# Patient Record
Sex: Female | Born: 1940 | Race: White | Hispanic: No | Marital: Married | Smoking: Former smoker
Health system: Southern US, Community
[De-identification: ages and names within clinical notes are randomized; demographics above are authoritative.]

## PROBLEM LIST (undated history)

## (undated) DIAGNOSIS — E785 Hyperlipidemia, unspecified: Secondary | ICD-10-CM

## (undated) DIAGNOSIS — M109 Gout, unspecified: Secondary | ICD-10-CM

## (undated) DIAGNOSIS — N281 Cyst of kidney, acquired: Secondary | ICD-10-CM

## (undated) DIAGNOSIS — N183 Chronic kidney disease, stage 3 unspecified: Secondary | ICD-10-CM

## (undated) DIAGNOSIS — D649 Anemia, unspecified: Secondary | ICD-10-CM

## (undated) DIAGNOSIS — E119 Type 2 diabetes mellitus without complications: Secondary | ICD-10-CM

## (undated) DIAGNOSIS — M1990 Unspecified osteoarthritis, unspecified site: Secondary | ICD-10-CM

## (undated) DIAGNOSIS — J189 Pneumonia, unspecified organism: Secondary | ICD-10-CM

## (undated) DIAGNOSIS — G43909 Migraine, unspecified, not intractable, without status migrainosus: Secondary | ICD-10-CM

## (undated) DIAGNOSIS — M545 Low back pain, unspecified: Secondary | ICD-10-CM

## (undated) DIAGNOSIS — Z87442 Personal history of urinary calculi: Secondary | ICD-10-CM

## (undated) DIAGNOSIS — I253 Aneurysm of heart: Secondary | ICD-10-CM

## (undated) DIAGNOSIS — I119 Hypertensive heart disease without heart failure: Secondary | ICD-10-CM

## (undated) DIAGNOSIS — Z9981 Dependence on supplemental oxygen: Secondary | ICD-10-CM

## (undated) DIAGNOSIS — I10 Essential (primary) hypertension: Secondary | ICD-10-CM

## (undated) DIAGNOSIS — I25118 Atherosclerotic heart disease of native coronary artery with other forms of angina pectoris: Secondary | ICD-10-CM

## (undated) DIAGNOSIS — G4730 Sleep apnea, unspecified: Secondary | ICD-10-CM

## (undated) DIAGNOSIS — G8929 Other chronic pain: Secondary | ICD-10-CM

## (undated) DIAGNOSIS — I2510 Atherosclerotic heart disease of native coronary artery without angina pectoris: Secondary | ICD-10-CM

## (undated) DIAGNOSIS — K219 Gastro-esophageal reflux disease without esophagitis: Secondary | ICD-10-CM

## (undated) DIAGNOSIS — Z970 Presence of artificial eye: Secondary | ICD-10-CM

## (undated) DIAGNOSIS — N289 Disorder of kidney and ureter, unspecified: Secondary | ICD-10-CM

## (undated) DIAGNOSIS — I5042 Chronic combined systolic (congestive) and diastolic (congestive) heart failure: Secondary | ICD-10-CM

## (undated) DIAGNOSIS — I214 Non-ST elevation (NSTEMI) myocardial infarction: Secondary | ICD-10-CM

## (undated) HISTORY — DX: Hyperlipidemia, unspecified: E78.5

## (undated) HISTORY — DX: Unspecified osteoarthritis, unspecified site: M19.90

## (undated) HISTORY — PX: LUMBAR DISC SURGERY: SHX700

## (undated) HISTORY — PX: EYE SURGERY: SHX253

## (undated) HISTORY — PX: REDUCTION MAMMAPLASTY: SUR839

## (undated) HISTORY — DX: Gastro-esophageal reflux disease without esophagitis: K21.9

## (undated) HISTORY — DX: Atherosclerotic heart disease of native coronary artery with other forms of angina pectoris: I25.118

## (undated) HISTORY — DX: Disorder of kidney and ureter, unspecified: N28.9

## (undated) HISTORY — DX: Essential (primary) hypertension: I10

## (undated) HISTORY — PX: KNEE ARTHROSCOPY: SHX127

## (undated) HISTORY — PX: BACK SURGERY: SHX140

## (undated) HISTORY — PX: DILATION AND CURETTAGE OF UTERUS: SHX78

---
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
## 1968-11-17 HISTORY — PX: VAGINAL HYSTERECTOMY: SUR661

## 1980-11-17 HISTORY — PX: CHOLECYSTECTOMY OPEN: SUR202

## 2009-04-23 ENCOUNTER — Ambulatory Visit (HOSPITAL_COMMUNITY): Admission: RE | Admit: 2009-04-23 | Discharge: 2009-04-23 | Payer: Self-pay | Admitting: Neurosurgery

## 2009-05-07 ENCOUNTER — Encounter: Admission: RE | Admit: 2009-05-07 | Discharge: 2009-05-07 | Payer: Self-pay | Admitting: Neurosurgery

## 2017-03-09 DIAGNOSIS — M65231 Calcific tendinitis, right forearm: Secondary | ICD-10-CM | POA: Diagnosis not present

## 2017-03-09 DIAGNOSIS — I2510 Atherosclerotic heart disease of native coronary artery without angina pectoris: Secondary | ICD-10-CM | POA: Diagnosis not present

## 2017-03-09 DIAGNOSIS — E119 Type 2 diabetes mellitus without complications: Secondary | ICD-10-CM | POA: Diagnosis not present

## 2017-03-25 DIAGNOSIS — M19021 Primary osteoarthritis, right elbow: Secondary | ICD-10-CM | POA: Diagnosis not present

## 2017-03-25 DIAGNOSIS — M25521 Pain in right elbow: Secondary | ICD-10-CM | POA: Diagnosis not present

## 2017-09-09 DIAGNOSIS — Z23 Encounter for immunization: Secondary | ICD-10-CM | POA: Diagnosis not present

## 2017-09-30 DIAGNOSIS — Z0000 Encounter for general adult medical examination without abnormal findings: Secondary | ICD-10-CM | POA: Diagnosis not present

## 2017-10-02 DIAGNOSIS — R8290 Unspecified abnormal findings in urine: Secondary | ICD-10-CM | POA: Diagnosis present

## 2017-10-02 DIAGNOSIS — I129 Hypertensive chronic kidney disease with stage 1 through stage 4 chronic kidney disease, or unspecified chronic kidney disease: Secondary | ICD-10-CM | POA: Diagnosis not present

## 2017-10-02 DIAGNOSIS — R0789 Other chest pain: Secondary | ICD-10-CM | POA: Diagnosis not present

## 2017-10-02 DIAGNOSIS — I493 Ventricular premature depolarization: Secondary | ICD-10-CM | POA: Diagnosis present

## 2017-10-02 DIAGNOSIS — G8929 Other chronic pain: Secondary | ICD-10-CM | POA: Diagnosis present

## 2017-10-02 DIAGNOSIS — E669 Obesity, unspecified: Secondary | ICD-10-CM | POA: Diagnosis not present

## 2017-10-02 DIAGNOSIS — Z794 Long term (current) use of insulin: Secondary | ICD-10-CM | POA: Diagnosis not present

## 2017-10-02 DIAGNOSIS — N183 Chronic kidney disease, stage 3 (moderate): Secondary | ICD-10-CM | POA: Diagnosis not present

## 2017-10-02 DIAGNOSIS — M159 Polyosteoarthritis, unspecified: Secondary | ICD-10-CM | POA: Diagnosis present

## 2017-10-02 DIAGNOSIS — Z7982 Long term (current) use of aspirin: Secondary | ICD-10-CM | POA: Diagnosis not present

## 2017-10-02 DIAGNOSIS — R079 Chest pain, unspecified: Secondary | ICD-10-CM | POA: Diagnosis not present

## 2017-10-02 DIAGNOSIS — F419 Anxiety disorder, unspecified: Secondary | ICD-10-CM | POA: Diagnosis not present

## 2017-10-02 DIAGNOSIS — K219 Gastro-esophageal reflux disease without esophagitis: Secondary | ICD-10-CM | POA: Diagnosis not present

## 2017-10-02 DIAGNOSIS — G4730 Sleep apnea, unspecified: Secondary | ICD-10-CM | POA: Diagnosis not present

## 2017-10-02 DIAGNOSIS — D509 Iron deficiency anemia, unspecified: Secondary | ICD-10-CM | POA: Diagnosis not present

## 2017-10-02 DIAGNOSIS — I2510 Atherosclerotic heart disease of native coronary artery without angina pectoris: Secondary | ICD-10-CM | POA: Diagnosis not present

## 2017-10-02 DIAGNOSIS — M549 Dorsalgia, unspecified: Secondary | ICD-10-CM | POA: Diagnosis present

## 2017-10-02 DIAGNOSIS — Z9049 Acquired absence of other specified parts of digestive tract: Secondary | ICD-10-CM | POA: Diagnosis not present

## 2017-10-02 DIAGNOSIS — Z6835 Body mass index (BMI) 35.0-35.9, adult: Secondary | ICD-10-CM | POA: Diagnosis not present

## 2017-10-02 DIAGNOSIS — M940 Chondrocostal junction syndrome [Tietze]: Secondary | ICD-10-CM | POA: Diagnosis present

## 2017-10-02 DIAGNOSIS — E1122 Type 2 diabetes mellitus with diabetic chronic kidney disease: Secondary | ICD-10-CM | POA: Diagnosis not present

## 2017-10-02 DIAGNOSIS — I252 Old myocardial infarction: Secondary | ICD-10-CM | POA: Diagnosis not present

## 2017-10-02 DIAGNOSIS — M109 Gout, unspecified: Secondary | ICD-10-CM | POA: Diagnosis present

## 2017-10-02 DIAGNOSIS — I214 Non-ST elevation (NSTEMI) myocardial infarction: Secondary | ICD-10-CM | POA: Diagnosis not present

## 2017-10-02 DIAGNOSIS — K449 Diaphragmatic hernia without obstruction or gangrene: Secondary | ICD-10-CM | POA: Diagnosis not present

## 2017-10-02 DIAGNOSIS — Z743 Need for continuous supervision: Secondary | ICD-10-CM | POA: Diagnosis not present

## 2017-10-02 DIAGNOSIS — Z87891 Personal history of nicotine dependence: Secondary | ICD-10-CM | POA: Diagnosis not present

## 2017-10-15 DIAGNOSIS — E119 Type 2 diabetes mellitus without complications: Secondary | ICD-10-CM | POA: Diagnosis not present

## 2017-10-15 DIAGNOSIS — I2510 Atherosclerotic heart disease of native coronary artery without angina pectoris: Secondary | ICD-10-CM | POA: Diagnosis not present

## 2017-10-15 DIAGNOSIS — R079 Chest pain, unspecified: Secondary | ICD-10-CM | POA: Diagnosis not present

## 2017-10-15 DIAGNOSIS — R0602 Shortness of breath: Secondary | ICD-10-CM | POA: Diagnosis not present

## 2017-11-26 DIAGNOSIS — R079 Chest pain, unspecified: Secondary | ICD-10-CM | POA: Diagnosis not present

## 2017-11-26 DIAGNOSIS — I10 Essential (primary) hypertension: Secondary | ICD-10-CM | POA: Diagnosis not present

## 2017-11-27 DIAGNOSIS — Z1231 Encounter for screening mammogram for malignant neoplasm of breast: Secondary | ICD-10-CM | POA: Diagnosis not present

## 2017-12-04 DIAGNOSIS — I10 Essential (primary) hypertension: Secondary | ICD-10-CM | POA: Diagnosis not present

## 2017-12-04 DIAGNOSIS — R079 Chest pain, unspecified: Secondary | ICD-10-CM | POA: Diagnosis not present

## 2017-12-08 DIAGNOSIS — Z961 Presence of intraocular lens: Secondary | ICD-10-CM | POA: Diagnosis not present

## 2017-12-08 DIAGNOSIS — E109 Type 1 diabetes mellitus without complications: Secondary | ICD-10-CM | POA: Diagnosis not present

## 2017-12-18 DIAGNOSIS — R079 Chest pain, unspecified: Secondary | ICD-10-CM | POA: Diagnosis not present

## 2017-12-18 DIAGNOSIS — I10 Essential (primary) hypertension: Secondary | ICD-10-CM | POA: Diagnosis not present

## 2017-12-18 HISTORY — PX: CARDIAC CATHETERIZATION: SHX172

## 2017-12-23 DIAGNOSIS — R079 Chest pain, unspecified: Secondary | ICD-10-CM | POA: Diagnosis not present

## 2017-12-23 DIAGNOSIS — I719 Aortic aneurysm of unspecified site, without rupture: Secondary | ICD-10-CM | POA: Diagnosis not present

## 2017-12-23 DIAGNOSIS — I10 Essential (primary) hypertension: Secondary | ICD-10-CM | POA: Diagnosis not present

## 2018-01-07 DIAGNOSIS — I10 Essential (primary) hypertension: Secondary | ICD-10-CM | POA: Diagnosis not present

## 2018-01-07 DIAGNOSIS — I25118 Atherosclerotic heart disease of native coronary artery with other forms of angina pectoris: Secondary | ICD-10-CM | POA: Diagnosis not present

## 2018-01-13 DIAGNOSIS — I9581 Postprocedural hypotension: Secondary | ICD-10-CM | POA: Diagnosis not present

## 2018-01-13 DIAGNOSIS — R8290 Unspecified abnormal findings in urine: Secondary | ICD-10-CM | POA: Diagnosis not present

## 2018-01-13 DIAGNOSIS — I214 Non-ST elevation (NSTEMI) myocardial infarction: Secondary | ICD-10-CM | POA: Diagnosis present

## 2018-01-13 DIAGNOSIS — G4733 Obstructive sleep apnea (adult) (pediatric): Secondary | ICD-10-CM | POA: Diagnosis present

## 2018-01-13 DIAGNOSIS — Q613 Polycystic kidney, unspecified: Secondary | ICD-10-CM | POA: Diagnosis not present

## 2018-01-13 DIAGNOSIS — R9439 Abnormal result of other cardiovascular function study: Secondary | ICD-10-CM | POA: Diagnosis not present

## 2018-01-13 DIAGNOSIS — J90 Pleural effusion, not elsewhere classified: Secondary | ICD-10-CM | POA: Diagnosis not present

## 2018-01-13 DIAGNOSIS — Q612 Polycystic kidney, adult type: Secondary | ICD-10-CM | POA: Diagnosis not present

## 2018-01-13 DIAGNOSIS — D62 Acute posthemorrhagic anemia: Secondary | ICD-10-CM | POA: Diagnosis not present

## 2018-01-13 DIAGNOSIS — Z87891 Personal history of nicotine dependence: Secondary | ICD-10-CM | POA: Diagnosis not present

## 2018-01-13 DIAGNOSIS — E1165 Type 2 diabetes mellitus with hyperglycemia: Secondary | ICD-10-CM | POA: Diagnosis not present

## 2018-01-13 DIAGNOSIS — E790 Hyperuricemia without signs of inflammatory arthritis and tophaceous disease: Secondary | ICD-10-CM | POA: Diagnosis not present

## 2018-01-13 DIAGNOSIS — I25119 Atherosclerotic heart disease of native coronary artery with unspecified angina pectoris: Secondary | ICD-10-CM | POA: Diagnosis present

## 2018-01-13 DIAGNOSIS — N200 Calculus of kidney: Secondary | ICD-10-CM | POA: Diagnosis present

## 2018-01-13 DIAGNOSIS — N183 Chronic kidney disease, stage 3 (moderate): Secondary | ICD-10-CM | POA: Diagnosis present

## 2018-01-13 DIAGNOSIS — R410 Disorientation, unspecified: Secondary | ICD-10-CM | POA: Diagnosis not present

## 2018-01-13 DIAGNOSIS — K219 Gastro-esophageal reflux disease without esophagitis: Secondary | ICD-10-CM | POA: Diagnosis present

## 2018-01-13 DIAGNOSIS — I2510 Atherosclerotic heart disease of native coronary artery without angina pectoris: Secondary | ICD-10-CM | POA: Diagnosis not present

## 2018-01-13 DIAGNOSIS — R0602 Shortness of breath: Secondary | ICD-10-CM | POA: Diagnosis not present

## 2018-01-13 DIAGNOSIS — R079 Chest pain, unspecified: Secondary | ICD-10-CM | POA: Diagnosis not present

## 2018-01-13 DIAGNOSIS — I25110 Atherosclerotic heart disease of native coronary artery with unstable angina pectoris: Secondary | ICD-10-CM | POA: Diagnosis not present

## 2018-01-13 DIAGNOSIS — R5381 Other malaise: Secondary | ICD-10-CM | POA: Diagnosis not present

## 2018-01-13 DIAGNOSIS — G8918 Other acute postprocedural pain: Secondary | ICD-10-CM | POA: Diagnosis not present

## 2018-01-13 DIAGNOSIS — D696 Thrombocytopenia, unspecified: Secondary | ICD-10-CM | POA: Diagnosis not present

## 2018-01-13 DIAGNOSIS — T402X5A Adverse effect of other opioids, initial encounter: Secondary | ICD-10-CM | POA: Diagnosis not present

## 2018-01-13 DIAGNOSIS — E1122 Type 2 diabetes mellitus with diabetic chronic kidney disease: Secondary | ICD-10-CM | POA: Diagnosis present

## 2018-01-13 DIAGNOSIS — R0789 Other chest pain: Secondary | ICD-10-CM | POA: Diagnosis not present

## 2018-01-13 DIAGNOSIS — I255 Ischemic cardiomyopathy: Secondary | ICD-10-CM | POA: Diagnosis present

## 2018-01-13 DIAGNOSIS — I129 Hypertensive chronic kidney disease with stage 1 through stage 4 chronic kidney disease, or unspecified chronic kidney disease: Secondary | ICD-10-CM | POA: Diagnosis present

## 2018-01-13 DIAGNOSIS — N390 Urinary tract infection, site not specified: Secondary | ICD-10-CM | POA: Diagnosis not present

## 2018-01-13 DIAGNOSIS — E119 Type 2 diabetes mellitus without complications: Secondary | ICD-10-CM | POA: Diagnosis not present

## 2018-01-14 ENCOUNTER — Encounter: Payer: Self-pay | Admitting: Internal Medicine

## 2018-01-19 HISTORY — PX: CORONARY ARTERY BYPASS GRAFT: SHX141

## 2018-01-26 DIAGNOSIS — Z7982 Long term (current) use of aspirin: Secondary | ICD-10-CM | POA: Diagnosis not present

## 2018-01-26 DIAGNOSIS — H54413A Blindness right eye category 3, normal vision left eye: Secondary | ICD-10-CM | POA: Diagnosis not present

## 2018-01-26 DIAGNOSIS — Z794 Long term (current) use of insulin: Secondary | ICD-10-CM | POA: Diagnosis not present

## 2018-01-26 DIAGNOSIS — Z9981 Dependence on supplemental oxygen: Secondary | ICD-10-CM | POA: Diagnosis not present

## 2018-01-26 DIAGNOSIS — Q613 Polycystic kidney, unspecified: Secondary | ICD-10-CM | POA: Diagnosis not present

## 2018-01-26 DIAGNOSIS — S20112D Abrasion of breast, left breast, subsequent encounter: Secondary | ICD-10-CM | POA: Diagnosis not present

## 2018-01-26 DIAGNOSIS — E119 Type 2 diabetes mellitus without complications: Secondary | ICD-10-CM | POA: Diagnosis not present

## 2018-01-26 DIAGNOSIS — G4733 Obstructive sleep apnea (adult) (pediatric): Secondary | ICD-10-CM | POA: Diagnosis not present

## 2018-01-26 DIAGNOSIS — I10 Essential (primary) hypertension: Secondary | ICD-10-CM | POA: Diagnosis not present

## 2018-01-26 DIAGNOSIS — Z7902 Long term (current) use of antithrombotics/antiplatelets: Secondary | ICD-10-CM | POA: Diagnosis not present

## 2018-01-26 DIAGNOSIS — I2510 Atherosclerotic heart disease of native coronary artery without angina pectoris: Secondary | ICD-10-CM | POA: Diagnosis not present

## 2018-01-26 DIAGNOSIS — S20111D Abrasion of breast, right breast, subsequent encounter: Secondary | ICD-10-CM | POA: Diagnosis not present

## 2018-01-26 DIAGNOSIS — Z48812 Encounter for surgical aftercare following surgery on the circulatory system: Secondary | ICD-10-CM | POA: Diagnosis not present

## 2018-01-28 DIAGNOSIS — Z951 Presence of aortocoronary bypass graft: Secondary | ICD-10-CM | POA: Diagnosis not present

## 2018-01-28 DIAGNOSIS — Z48812 Encounter for surgical aftercare following surgery on the circulatory system: Secondary | ICD-10-CM | POA: Diagnosis not present

## 2018-01-28 DIAGNOSIS — E119 Type 2 diabetes mellitus without complications: Secondary | ICD-10-CM | POA: Diagnosis not present

## 2018-01-28 DIAGNOSIS — I10 Essential (primary) hypertension: Secondary | ICD-10-CM | POA: Diagnosis not present

## 2018-01-28 DIAGNOSIS — S20111D Abrasion of breast, right breast, subsequent encounter: Secondary | ICD-10-CM | POA: Diagnosis not present

## 2018-01-28 DIAGNOSIS — I2510 Atherosclerotic heart disease of native coronary artery without angina pectoris: Secondary | ICD-10-CM | POA: Diagnosis not present

## 2018-01-28 DIAGNOSIS — Q613 Polycystic kidney, unspecified: Secondary | ICD-10-CM | POA: Diagnosis not present

## 2018-01-30 DIAGNOSIS — Z48812 Encounter for surgical aftercare following surgery on the circulatory system: Secondary | ICD-10-CM | POA: Diagnosis not present

## 2018-01-30 DIAGNOSIS — I2510 Atherosclerotic heart disease of native coronary artery without angina pectoris: Secondary | ICD-10-CM | POA: Diagnosis not present

## 2018-01-30 DIAGNOSIS — Q613 Polycystic kidney, unspecified: Secondary | ICD-10-CM | POA: Diagnosis not present

## 2018-01-30 DIAGNOSIS — S20111D Abrasion of breast, right breast, subsequent encounter: Secondary | ICD-10-CM | POA: Diagnosis not present

## 2018-01-30 DIAGNOSIS — I10 Essential (primary) hypertension: Secondary | ICD-10-CM | POA: Diagnosis not present

## 2018-01-30 DIAGNOSIS — E119 Type 2 diabetes mellitus without complications: Secondary | ICD-10-CM | POA: Diagnosis not present

## 2018-02-04 ENCOUNTER — Ambulatory Visit (INDEPENDENT_AMBULATORY_CARE_PROVIDER_SITE_OTHER): Payer: Medicare Other | Admitting: Family Medicine

## 2018-02-04 ENCOUNTER — Encounter: Payer: Self-pay | Admitting: Family Medicine

## 2018-02-04 VITALS — BP 100/70 | HR 76 | Temp 98.7°F | Ht 62.0 in | Wt 189.4 lb

## 2018-02-04 DIAGNOSIS — Z951 Presence of aortocoronary bypass graft: Secondary | ICD-10-CM

## 2018-02-04 HISTORY — DX: Presence of aortocoronary bypass graft: Z95.1

## 2018-02-04 MED ORDER — ACETAMINOPHEN-CODEINE #3 300-30 MG PO TABS: 1.0000 | ORAL_TABLET | Freq: Four times a day (QID) | ORAL | 0 refills | Status: DC | PRN

## 2018-02-04 NOTE — Patient Instructions (Addendum)
2-3 business days for someone to reach out regarding home health. If you don't hear anything, call and ask for an update.  Let me know if you need refills.  Let us know if you need anything.

## 2018-02-04 NOTE — Progress Notes (Signed)
Pre visit review using our clinic review tool, if applicable. No additional management support is needed unless otherwise documented below in the visit note. 

## 2018-02-04 NOTE — Progress Notes (Signed)
Chief Complaint  Patient presents with  . Establish Care       New Patient Visit SUBJECTIVE: HPI: Margaret Ward is an 77 y.o.female who is being seen for establishing care.  The patient was previously seen at office [facility]. Here with daughter.  Little over 2 weeks ago, the patient had a triple bypass surgery on her heart.  She is still slightly painful but is recovering.  She had the surgery in [place], however her husband has a bad knee and all of her family members are in [place] so she moved down here.  She is currently using a walker for ambulation and is still slightly unsteady on her feet.  She had home health in [place] and her family was hoping to have this set up down here.  This is to help her with activities of daily living in addition to helping her with medications.  She does have an appointment with Dr. Dulce Sellar of the cardiology team in 5 days.  She denies any chest pain unrelated to her incision or shortness of breath.  She did not require any refills at this time.  Allergies  Allergen Reactions  . Methylprednisolone     Severe cramps legs and arms  . Keflex [Cephalexin] Hives  . Ketorolac Swelling  . Novocain [Procaine] Swelling  . Septra [Sulfamethoxazole-Trimethoprim] Nausea And Vomiting   Past Medical History:  Diagnosis Date  . Arthritis   . Diabetes mellitus without complication (HCC)   . Heart disease   . Hyperlipidemia   . Kidney disease   . UTI (urinary tract infection)    Past Surgical History:  Procedure Laterality Date  . ABDOMINAL HYSTERECTOMY  1970  . CHOLECYSTECTOMY  1982  . TONSILLECTOMY  1953   Social History   Socioeconomic History  . Marital status: Married   Family History  Problem Relation Age of Onset  . Kidney disease Sister   . Heart disease Brother   . Hypertension Brother   . Hypertension Daughter   . Hypertension Daughter   . Hypertension Daughter   . Hypertension Daughter      Current Outpatient  Medications:  .  acetaminophen-codeine (TYLENOL #3) 300-30 MG tablet, Take 1-2 tablets by mouth every 6 (six) hours as needed for moderate pain., Disp: 12 tablet, Rfl: 0 .  allopurinol (ZYLOPRIM) 100 MG tablet, Take 100 mg by mouth daily., Disp: , Rfl:  .  aspirin EC 81 MG tablet, Take 81 mg by mouth daily., Disp: , Rfl:  .  atenolol (TENORMIN) 50 MG tablet, Take 50 mg by mouth daily., Disp: , Rfl:  .  atorvastatin (LIPITOR) 40 MG tablet, Take 40 mg by mouth daily., Disp: , Rfl:  .  clopidogrel (PLAVIX) 75 MG tablet, Take 75 mg by mouth daily., Disp: , Rfl:  .  diltiazem (DILACOR XR) 180 MG 24 hr capsule, Take 180 mg by mouth daily., Disp: , Rfl:  .  docusate sodium (COLACE) 100 MG capsule, Take 100 mg by mouth 2 (two) times daily., Disp: , Rfl:  .  famotidine (PEPCID) 20 MG tablet, Take 1/2 every 12 hours, Disp: , Rfl:  .  ferrous sulfate 325 (65 FE) MG tablet, Take 325 mg by mouth daily with breakfast., Disp: , Rfl:  .  Insulin Glargine (LANTUS) 100 UNIT/ML Solostar Pen, Inject 18 Units into the skin at bedtime., Disp: , Rfl:  .  ranitidine (ZANTAC) 150 MG tablet, Take 150 mg by mouth daily., Disp: , Rfl:  .  vitamin C (ASCORBIC  ACID) 500 MG tablet, Take 500 mg by mouth 2 (two) times daily., Disp: , Rfl:   ROS Cardiovascular: Denies chest pain  Respiratory: Denies dyspnea   OBJECTIVE: BP 100/70 (BP Location: Left Arm, Patient Position: Sitting, Cuff Size: Normal)   Pulse 76   Temp 98.7 F (37.1 C) (Oral)   Ht 5\' 2"  (1.575 m)   Wt 189 lb 6 oz (85.9 kg)   SpO2 96%   BMI 34.64 kg/m   Constitutional: -  VS reviewed -  Well developed, well nourished, appears stated age -  No apparent distress  Psychiatric: -  Oriented to person, place, and time -  Memory intact -  Affect and mood normal -  Fluent conversation, good eye contact -  Judgment and insight age appropriate  Eye: -  Conjunctivae clear, no discharge -  Pupils symmetric, round, reactive to light  ENMT: -  MMM    Pharynx  moist, no exudate, no erythema  Neck: -  No gross swelling, no palpable masses -  Thyroid midline, not enlarged, mobile, no palpable masses  Cardiovascular: -  RRR -  2+ pitting BLE edema  Respiratory: -  Normal respiratory effort, no accessory muscle use, no retraction -  Breath sounds equal, no wheezes, no ronchi, no crackles  Skin: -  Healing surgical site, c/d/i, no signs of infection   ASSESSMENT/PLAN: S/P coronary artery bypass graft x 3 - Plan: Ambulatory referral to Home Health  Patient instructed to sign release of records form from her previous PCP. Refill pain meds in small amount. Patient should return in 5 mo for DM visit. The patient voiced understanding and agreement to the plan.   Jilda Rocheicholas Paul [place] De[place]endling, DO 02/04/18  3:34 PM

## 2018-02-05 DIAGNOSIS — R079 Chest pain, unspecified: Secondary | ICD-10-CM

## 2018-02-05 HISTORY — DX: Chest pain, unspecified: R07.9

## 2018-02-07 DIAGNOSIS — K219 Gastro-esophageal reflux disease without esophagitis: Secondary | ICD-10-CM | POA: Diagnosis not present

## 2018-02-07 DIAGNOSIS — M1990 Unspecified osteoarthritis, unspecified site: Secondary | ICD-10-CM | POA: Diagnosis not present

## 2018-02-07 DIAGNOSIS — N189 Chronic kidney disease, unspecified: Secondary | ICD-10-CM | POA: Diagnosis not present

## 2018-02-07 DIAGNOSIS — R2681 Unsteadiness on feet: Secondary | ICD-10-CM | POA: Diagnosis not present

## 2018-02-07 DIAGNOSIS — I1310 Hypertensive heart and chronic kidney disease without heart failure, with stage 1 through stage 4 chronic kidney disease, or unspecified chronic kidney disease: Secondary | ICD-10-CM | POA: Diagnosis not present

## 2018-02-07 DIAGNOSIS — Z794 Long term (current) use of insulin: Secondary | ICD-10-CM | POA: Diagnosis not present

## 2018-02-07 DIAGNOSIS — F17211 Nicotine dependence, cigarettes, in remission: Secondary | ICD-10-CM | POA: Diagnosis not present

## 2018-02-07 DIAGNOSIS — E1122 Type 2 diabetes mellitus with diabetic chronic kidney disease: Secondary | ICD-10-CM | POA: Diagnosis not present

## 2018-02-07 DIAGNOSIS — Z48812 Encounter for surgical aftercare following surgery on the circulatory system: Secondary | ICD-10-CM | POA: Diagnosis not present

## 2018-02-07 DIAGNOSIS — Z7902 Long term (current) use of antithrombotics/antiplatelets: Secondary | ICD-10-CM | POA: Diagnosis not present

## 2018-02-07 DIAGNOSIS — Z7982 Long term (current) use of aspirin: Secondary | ICD-10-CM | POA: Diagnosis not present

## 2018-02-07 DIAGNOSIS — Z951 Presence of aortocoronary bypass graft: Secondary | ICD-10-CM | POA: Diagnosis not present

## 2018-02-07 DIAGNOSIS — I2510 Atherosclerotic heart disease of native coronary artery without angina pectoris: Secondary | ICD-10-CM | POA: Diagnosis not present

## 2018-02-08 ENCOUNTER — Ambulatory Visit: Payer: Self-pay | Admitting: Family Medicine

## 2018-02-08 DIAGNOSIS — E785 Hyperlipidemia, unspecified: Secondary | ICD-10-CM | POA: Insufficient documentation

## 2018-02-08 DIAGNOSIS — I2510 Atherosclerotic heart disease of native coronary artery without angina pectoris: Secondary | ICD-10-CM | POA: Insufficient documentation

## 2018-02-08 HISTORY — DX: Atherosclerotic heart disease of native coronary artery without angina pectoris: I25.10

## 2018-02-08 NOTE — Progress Notes (Signed)
Cardiology Office Note:    Date:  02/09/2018   ID:  Margaret Ward, DOB 10/01/1941, MRN [id number]  PCP:  Sharlene DoryWendling, Nicholas Paul, DO  Cardiologist:  Norman HerrlichBrian Nuria Phebus, MD   Referring MD: Sharlene DoryWendling, Nicholas Paul*  ASSESSMENT:    1. S/P coronary artery bypass graft x 3   2. Coronary artery disease of native artery of native heart with stable angina pectoris (HCC)   3. Hyperlipidemia, unspecified hyperlipidemia type   4. Left ventricular aneurysm   5. Left shoulder pain, unspecified chronicity    PLAN:    In order of problems listed above:  1. She has progressed well no postoperative complications check chest x-ray lab work including CMP and lipid profile and arrange for follow-up with CT surgery. 2. Improved after surgery she is on both aspirin clopidogrel and I will continue that along with her beta-blocker and statin.  I asked her to have an echocardiogram to assess will be described as an apical aneurysm and look for evidence of thrombus. 3. Stable continue her statin check liver function and lipid profile 4. Echocardiogram ordered if thrombus is present we would need to consider a different strategy of antiplatelet.  Next appointment 4 weeks   Medication Adjustments/Labs and Tests Ordered: Current medicines are reviewed at length with the patient today.  Concerns regarding medicines are outlined above.  Orders Placed This Encounter  Procedures  . DG Chest 2 View  . DG Scapula Left  . Comprehensive Metabolic Panel (CMET)  . Lipid Profile  . Ambulatory referral to Cardiothoracic Surgery  . EKG 12-Lead  . ECHOCARDIOGRAM COMPLETE   No orders of the defined types were placed in this encounter.    Chief Complaint  Patient presents with  . New Patient (Initial Visit)    self referall to get established with cardiologist after recent CABG x 3 vessels   . Coronary Artery Disease    had CAB[place] in [place] and now living in [place]  . Hyperlipidemia    History of Present Illness:     Margaret Ward is a 77 y.o. female who is being seen today for the evaluation of CAD with recent CABG in [place] at the request of Sharlene DoryWendling, Nicholas Paul*. Her pre op echo showed " preserved systolic function with a small apical aneurysm" and angiography with multivessel CAD.  She underwent bypass surgery 01/19/2018 and her daughter brought her back to [place] .  She has not been seen by CT surgery and will make an appointment with our group in [place].  Overall she has done well no fever chills no wound drainage.  She is using incentive spirometry greater than 1500 cc and is walking with assistance and still requires assistance with ADLs.  She has minimal incisional pain no angina palpitation or syncope.  The family declines cardiac rehabilitation at this time.  She also complains of pain over the left scapula and is point tender to physical examination.  Lower extremity leg wounds are well-healed.  She has mild nonpitting edema likely related to her calcium channel blocker  Past Medical History:  Diagnosis Date  . Arthritis   . Chest pain 02/05/2018  . Diabetes mellitus without complication (HCC)   . GERD (gastroesophageal reflux disease)   . Heart disease   . Hyperlipidemia   . Hypertension   . Kidney disease     Past Surgical History:  Procedure Laterality Date  . ABDOMINAL HYSTERECTOMY  1970  . BACK SURGERY    . CHOLECYSTECTOMY  1982  . CORONARY  ARTERY BYPASS GRAFT    . TONSILLECTOMY  1953    Current Medications: Current Meds  Medication Sig  . acetaminophen-codeine (TYLENOL #3) 300-30 MG tablet Take 1-2 tablets by mouth every 6 (six) hours as needed for moderate pain.  Marland Kitchen allopurinol (ZYLOPRIM) 100 MG tablet Take 100 mg by mouth daily.  Marland Kitchen aspirin EC 81 MG tablet Take 81 mg by mouth daily.  Marland Kitchen atenolol (TENORMIN) 50 MG tablet Take 50 mg by mouth daily.  Marland Kitchen atorvastatin (LIPITOR) 40 MG tablet Take 40 mg by mouth daily.  . clopidogrel (PLAVIX) 75 MG tablet Take  75 mg by mouth daily.  Marland Kitchen diltiazem (DILACOR XR) 180 MG 24 hr capsule Take 180 mg by mouth daily.  Marland Kitchen docusate sodium (COLACE) 100 MG capsule Take 100 mg by mouth 2 (two) times daily.  . famotidine (PEPCID) 20 MG tablet Take 1/2 every 12 hours  . ferrous sulfate 325 (65 FE) MG tablet Take 325 mg by mouth daily with breakfast.  . Insulin Glargine (LANTUS) 100 UNIT/ML Solostar Pen Inject 18 Units into the skin at bedtime.  . ranitidine (ZANTAC) 150 MG tablet Take 150 mg by mouth daily.  . vitamin C (ASCORBIC ACID) 500 MG tablet Take 500 mg by mouth 2 (two) times daily.     Allergies:   Methylprednisolone; Keflex [cephalexin]; Ketorolac; Novocain [procaine]; and Septra [sulfamethoxazole-trimethoprim]   Social History   Socioeconomic History  . Marital status: Married    Spouse name: Not on file  . Number of children: Not on file  . Years of education: Not on file  . Highest education level: Not on file  Occupational History  . Not on file  Social Needs  . Financial resource strain: Not on file  . Food insecurity:    Worry: Not on file    Inability: Not on file  . Transportation needs:    Medical: Not on file    Non-medical: Not on file  Tobacco Use  . Smoking status: Former Games developer  . Smokeless tobacco: Never Used  Substance and Sexual Activity  . Alcohol use: Not Currently    Frequency: Never  . Drug use: Never  . Sexual activity: Not on file  Lifestyle  . Physical activity:    Days per week: Not on file    Minutes per session: Not on file  . Stress: Not on file  Relationships  . Social connections:    Talks on phone: Not on file    Gets together: Not on file    Attends religious service: Not on file    Active member of club or organization: Not on file    Attends meetings of clubs or organizations: Not on file    Relationship status: Not on file  Other Topics Concern  . Not on file  Social History Narrative  . Not on file     Family History: The patient's family  history includes Diabetes in her father; Heart Problems in her mother; Heart disease in her brother; Hypertension in her brother, daughter, daughter, daughter, and daughter; Kidney disease in her brother and sister.  ROS:   ROS Please see the history of present illness.     All other systems reviewed and are negative.  EKGs/Labs/Other Studies Reviewed:    The following studies were reviewed today: Cardiology records office visit stress test heart catheterization reviewed CT surgery records requested not in epic  EKG:  EKG is  ordered today.  The ekg ordered today demonstrates QS in V3  consistent with apical MI  Recent Labs: No results found for requested labs within last 8760 hours.  Recent Lipid Panel No results found for: CHOL, TRIG, HDL, CHOLHDL, VLDL, LDLCALC, LDLDIRECT  Physical Exam:    VS:  BP 126/72 (BP Location: Right Arm, Patient Position: Sitting, Cuff Size: Normal)   Pulse 67   Ht 5\' 2"  (1.575 m)   Wt 190 lb (86.2 kg)   SpO2 98%   BMI 34.75 kg/m     Wt Readings from Last 3 Encounters:  02/09/18 190 lb (86.2 kg)  02/04/18 189 lb 6 oz (85.9 kg)     GEN: She looks frail but well nourished, well developed in no acute distress HEENT: Normal NECK: No JVD; No carotid bruits LYMPHATICS: No lymphadenopathy CARDIAC: RRR, no murmurs, rubs, gallops RESPIRATORY:  Clear to auscultation without rales, wheezing or rhonchi  ABDOMEN: Soft, non-tender, non-distended MUSCULOSKELETAL:  No edema; No deformity  SKIN: Warm and dry NEUROLOGIC:  Alert and oriented x 3 PSYCHIATRIC:  Normal affect     Signed, Norman Herrlich, MD  02/09/2018 1:03 PM    [facility]

## 2018-02-09 ENCOUNTER — Ambulatory Visit (HOSPITAL_BASED_OUTPATIENT_CLINIC_OR_DEPARTMENT_OTHER)
Admission: RE | Admit: 2018-02-09 | Discharge: 2018-02-09 | Disposition: A | Discharge status: Home / Self Care | Payer: Medicare Other | Source: Ambulatory Visit | Attending: Cardiology | Admitting: Cardiology

## 2018-02-09 ENCOUNTER — Encounter: Payer: Self-pay | Admitting: Cardiology

## 2018-02-09 ENCOUNTER — Ambulatory Visit (INDEPENDENT_AMBULATORY_CARE_PROVIDER_SITE_OTHER): Payer: Medicare Other | Admitting: Cardiology

## 2018-02-09 VITALS — BP 126/72 | HR 67 | Ht 62.0 in | Wt 190.0 lb

## 2018-02-09 DIAGNOSIS — M25512 Pain in left shoulder: Secondary | ICD-10-CM | POA: Diagnosis not present

## 2018-02-09 DIAGNOSIS — I253 Aneurysm of heart: Secondary | ICD-10-CM

## 2018-02-09 DIAGNOSIS — Z951 Presence of aortocoronary bypass graft: Secondary | ICD-10-CM | POA: Diagnosis not present

## 2018-02-09 DIAGNOSIS — J90 Pleural effusion, not elsewhere classified: Secondary | ICD-10-CM | POA: Insufficient documentation

## 2018-02-09 DIAGNOSIS — R918 Other nonspecific abnormal finding of lung field: Secondary | ICD-10-CM | POA: Insufficient documentation

## 2018-02-09 DIAGNOSIS — I25118 Atherosclerotic heart disease of native coronary artery with other forms of angina pectoris: Secondary | ICD-10-CM | POA: Diagnosis not present

## 2018-02-09 DIAGNOSIS — E785 Hyperlipidemia, unspecified: Secondary | ICD-10-CM | POA: Diagnosis not present

## 2018-02-09 DIAGNOSIS — Z9889 Other specified postprocedural states: Secondary | ICD-10-CM | POA: Insufficient documentation

## 2018-02-09 NOTE — Patient Instructions (Signed)
Medication Instructions:  Your physician recommends that you continue on your current medications as directed. Please refer to the Current Medication list given to you today.  Labwork: Your physician recommends that you have the following labs drawn: CMP, lipid  Testing/Procedures: A chest x-ray takes a picture of the organs and structures inside the chest, including the heart, lungs, and blood vessels. This test can show several things, including, whether the heart is enlarges; whether fluid is building up in the lungs; and whether pacemaker / defibrillator leads are still in place.  You will also have a x-ray of your scapula.  Your physician has requested that you have an echocardiogram. Echocardiography is a painless test that uses sound waves to create images of your heart. It provides your doctor with information about the size and shape of your heart and how well your heart's chambers and valves are working. This procedure takes approximately one hour. There are no restrictions for this procedure.  Follow-Up: Your physician recommends that you schedule a follow-up appointment in: 4 weeks.  You will receive a phone call to schedule with cardiothoracic surgery.  Any Other Special Instructions Will Be Listed Below (If Applicable).     If you need a refill on your cardiac medications before your next appointment, please call your pharmacy.

## 2018-02-10 ENCOUNTER — Other Ambulatory Visit: Payer: Self-pay

## 2018-02-10 ENCOUNTER — Telehealth: Payer: Self-pay

## 2018-02-10 LAB — LIPID PANEL
Chol/HDL Ratio: 2.2 ratio (ref 0.0–4.4)
Cholesterol, Total: 84 mg/dL — ABNORMAL LOW (ref 100–199)
HDL: 39 mg/dL — ABNORMAL LOW (ref >39)
LDL CALC: 27 mg/dL (ref 0–99)
Triglycerides: 90 mg/dL (ref 0–149)
VLDL CHOLESTEROL CAL: 18 mg/dL (ref 5–40)

## 2018-02-10 LAB — COMPREHENSIVE METABOLIC PANEL
ALBUMIN: 3.2 g/dL — AB (ref 3.5–4.8)
ALT: 13 IU/L (ref 0–32)
AST: 14 IU/L (ref 0–40)
Albumin/Globulin Ratio: 1 — ABNORMAL LOW (ref 1.2–2.2)
Alkaline Phosphatase: 114 IU/L (ref 39–117)
BUN / CREAT RATIO: 15 (ref 12–28)
BUN: 18 mg/dL (ref 8–27)
CO2: 21 mmol/L (ref 20–29)
Calcium: 8.6 mg/dL — ABNORMAL LOW (ref 8.7–10.3)
Chloride: 110 mmol/L — ABNORMAL HIGH (ref 96–106)
Creatinine, Ser: 1.2 mg/dL — ABNORMAL HIGH (ref 0.57–1.00)
GFR calc Af Amer: 50 mL/min/{1.73_m2} — ABNORMAL LOW (ref >59)
GFR calc non Af Amer: 44 mL/min/{1.73_m2} — ABNORMAL LOW (ref >59)
GLOBULIN, TOTAL: 3.1 g/dL (ref 1.5–4.5)
GLUCOSE: 100 mg/dL — AB (ref 65–99)
Potassium: 5.2 mmol/L (ref 3.5–5.2)
Sodium: 144 mmol/L (ref 134–144)
Total Protein: 6.3 g/dL (ref 6.0–8.5)

## 2018-02-10 MED ORDER — ATORVASTATIN CALCIUM 20 MG PO TABS: 40.0000 mg | ORAL_TABLET | Freq: Every day | ORAL | 3 refills | Status: DC

## 2018-02-10 NOTE — Telephone Encounter (Signed)
Spoke with Lorin PicketScott, Director of [facility], regarding setting patient up for cardiac rehab. Patient has [facility] by Dr. Carmelia RollerWendling. Advised Scott that Dr. Dulce SellarMunley would like to know if they had a home health cardiac rehab program. Lorin PicketScott advised that they do offer cardiac rehab home health services with nursing staff and a physical therapist. Verbal order for cardiac rehab given. Faxing Dr. Hulen ShoutsMunley's office note to [phone]. No further questions.

## 2018-02-11 ENCOUNTER — Telehealth: Payer: Self-pay | Admitting: Cardiology

## 2018-02-11 DIAGNOSIS — I1310 Hypertensive heart and chronic kidney disease without heart failure, with stage 1 through stage 4 chronic kidney disease, or unspecified chronic kidney disease: Secondary | ICD-10-CM | POA: Diagnosis not present

## 2018-02-11 DIAGNOSIS — N189 Chronic kidney disease, unspecified: Secondary | ICD-10-CM | POA: Diagnosis not present

## 2018-02-11 DIAGNOSIS — E1122 Type 2 diabetes mellitus with diabetic chronic kidney disease: Secondary | ICD-10-CM | POA: Diagnosis not present

## 2018-02-11 DIAGNOSIS — Z951 Presence of aortocoronary bypass graft: Secondary | ICD-10-CM | POA: Diagnosis not present

## 2018-02-11 DIAGNOSIS — I2510 Atherosclerotic heart disease of native coronary artery without angina pectoris: Secondary | ICD-10-CM | POA: Diagnosis not present

## 2018-02-11 DIAGNOSIS — Z48812 Encounter for surgical aftercare following surgery on the circulatory system: Secondary | ICD-10-CM | POA: Diagnosis not present

## 2018-02-11 NOTE — Telephone Encounter (Signed)
Informed the nurse that the patient was not started on a new medication at her last visit.

## 2018-02-11 NOTE — Telephone Encounter (Signed)
Wants to know if pt was started on a new med

## 2018-02-12 ENCOUNTER — Telehealth: Payer: Self-pay | Admitting: *Deleted

## 2018-02-12 NOTE — Telephone Encounter (Signed)
Received Physician Orders from [facility]; forwarded to provider/SLS 03/29

## 2018-02-15 DIAGNOSIS — Z48812 Encounter for surgical aftercare following surgery on the circulatory system: Secondary | ICD-10-CM | POA: Diagnosis not present

## 2018-02-15 DIAGNOSIS — I1310 Hypertensive heart and chronic kidney disease without heart failure, with stage 1 through stage 4 chronic kidney disease, or unspecified chronic kidney disease: Secondary | ICD-10-CM | POA: Diagnosis not present

## 2018-02-15 DIAGNOSIS — Z951 Presence of aortocoronary bypass graft: Secondary | ICD-10-CM | POA: Diagnosis not present

## 2018-02-15 DIAGNOSIS — N189 Chronic kidney disease, unspecified: Secondary | ICD-10-CM | POA: Diagnosis not present

## 2018-02-15 DIAGNOSIS — I2510 Atherosclerotic heart disease of native coronary artery without angina pectoris: Secondary | ICD-10-CM | POA: Diagnosis not present

## 2018-02-15 DIAGNOSIS — E1122 Type 2 diabetes mellitus with diabetic chronic kidney disease: Secondary | ICD-10-CM | POA: Diagnosis not present

## 2018-02-16 ENCOUNTER — Telehealth: Payer: Self-pay | Admitting: Cardiology

## 2018-02-16 DIAGNOSIS — I2510 Atherosclerotic heart disease of native coronary artery without angina pectoris: Secondary | ICD-10-CM | POA: Diagnosis not present

## 2018-02-16 DIAGNOSIS — Z48812 Encounter for surgical aftercare following surgery on the circulatory system: Secondary | ICD-10-CM | POA: Diagnosis not present

## 2018-02-16 DIAGNOSIS — N189 Chronic kidney disease, unspecified: Secondary | ICD-10-CM | POA: Diagnosis not present

## 2018-02-16 DIAGNOSIS — E1122 Type 2 diabetes mellitus with diabetic chronic kidney disease: Secondary | ICD-10-CM | POA: Diagnosis not present

## 2018-02-16 DIAGNOSIS — Z951 Presence of aortocoronary bypass graft: Secondary | ICD-10-CM | POA: Diagnosis not present

## 2018-02-16 DIAGNOSIS — I1310 Hypertensive heart and chronic kidney disease without heart failure, with stage 1 through stage 4 chronic kidney disease, or unspecified chronic kidney disease: Secondary | ICD-10-CM | POA: Diagnosis not present

## 2018-02-16 NOTE — Telephone Encounter (Signed)
Vitamin C 500mg 

## 2018-02-16 NOTE — Telephone Encounter (Signed)
What med?

## 2018-02-16 NOTE — Telephone Encounter (Signed)
You did not order and will advise that we will not refill, but is it okay to continue?

## 2018-02-16 NOTE — Telephone Encounter (Signed)
°*  STAT* If patient is at the pharmacy, call can be transferred to refill team.   1. Which medications need to be refilled? (please list name of each medication and dose if known) Vitamon C 500mg    2. Which pharmacy/location (including street and city if local pharmacy) is medication to be sent to? [facility] [place] on [address] [place]  3. Do they need a 30 day or 90 day supply? 90  Please verify if she is to continue this medicine or to stop it, Please notify daughter!

## 2018-02-17 ENCOUNTER — Telehealth: Payer: Self-pay | Admitting: Family Medicine

## 2018-02-17 NOTE — Telephone Encounter (Signed)
yes

## 2018-02-17 NOTE — Telephone Encounter (Signed)
Advised okay to continue Vitamin C, but Dr. Dulce SellarMunley will not refill. Should ask PCP or buy over the counter. Verbalized understanding, no further questions.

## 2018-02-17 NOTE — Telephone Encounter (Signed)
HHRN informed of verbal ok. 

## 2018-02-17 NOTE — Telephone Encounter (Signed)
Copied from CRM [id number]. Topic: Quick Communication - See Telephone Encounter >> Feb 17, 2018 12:30 PM Everardo PacificMoton, Briton Sellman, [place] wrote: CRM for notification. See Telephone encounter for: 02/17/18. Liji is calling because she needs to have a verbal order for the patient to receive home health physical therapy 2 times for 3 weeks and 1 time for 3 weeks. If someone could give her a call back about this at [phone]

## 2018-02-18 ENCOUNTER — Telehealth: Payer: Self-pay | Admitting: *Deleted

## 2018-02-18 ENCOUNTER — Telehealth: Payer: Self-pay

## 2018-02-18 DIAGNOSIS — N189 Chronic kidney disease, unspecified: Secondary | ICD-10-CM | POA: Diagnosis not present

## 2018-02-18 DIAGNOSIS — I1310 Hypertensive heart and chronic kidney disease without heart failure, with stage 1 through stage 4 chronic kidney disease, or unspecified chronic kidney disease: Secondary | ICD-10-CM | POA: Diagnosis not present

## 2018-02-18 DIAGNOSIS — N200 Calculus of kidney: Secondary | ICD-10-CM

## 2018-02-18 DIAGNOSIS — Z48812 Encounter for surgical aftercare following surgery on the circulatory system: Secondary | ICD-10-CM | POA: Diagnosis not present

## 2018-02-18 DIAGNOSIS — I2510 Atherosclerotic heart disease of native coronary artery without angina pectoris: Secondary | ICD-10-CM | POA: Diagnosis not present

## 2018-02-18 DIAGNOSIS — E1122 Type 2 diabetes mellitus with diabetic chronic kidney disease: Secondary | ICD-10-CM | POA: Diagnosis not present

## 2018-02-18 DIAGNOSIS — Z951 Presence of aortocoronary bypass graft: Secondary | ICD-10-CM | POA: Diagnosis not present

## 2018-02-18 NOTE — Telephone Encounter (Signed)
Patient at appointment with husband and requested a urology referral for herself. Patient was seen in appointment by Dr. Dulce SellarMunley last week and forgot to ask for it then. Dr. Dulce SellarMunley agreed. Referral placed.

## 2018-02-18 NOTE — Telephone Encounter (Signed)
Received Home Health Certification and Plan of Care; forwarded to provider/SLS 04/04

## 2018-02-22 ENCOUNTER — Telehealth: Payer: Self-pay | Admitting: *Deleted

## 2018-02-22 DIAGNOSIS — E1122 Type 2 diabetes mellitus with diabetic chronic kidney disease: Secondary | ICD-10-CM | POA: Diagnosis not present

## 2018-02-22 DIAGNOSIS — I1310 Hypertensive heart and chronic kidney disease without heart failure, with stage 1 through stage 4 chronic kidney disease, or unspecified chronic kidney disease: Secondary | ICD-10-CM | POA: Diagnosis not present

## 2018-02-22 DIAGNOSIS — N189 Chronic kidney disease, unspecified: Secondary | ICD-10-CM | POA: Diagnosis not present

## 2018-02-22 DIAGNOSIS — Z48812 Encounter for surgical aftercare following surgery on the circulatory system: Secondary | ICD-10-CM | POA: Diagnosis not present

## 2018-02-22 DIAGNOSIS — I2510 Atherosclerotic heart disease of native coronary artery without angina pectoris: Secondary | ICD-10-CM | POA: Diagnosis not present

## 2018-02-22 DIAGNOSIS — Z951 Presence of aortocoronary bypass graft: Secondary | ICD-10-CM | POA: Diagnosis not present

## 2018-02-22 NOTE — Telephone Encounter (Signed)
Take zantac only

## 2018-02-22 NOTE — Telephone Encounter (Signed)
Please advise 

## 2018-02-22 NOTE — Telephone Encounter (Signed)
Pt's daughter wanted to know if her mom still needs to be on Pepcid. Pt has run out but not sure if she needs anymore already taking Zantac 150 mg daily. Would like to know what to do. Please advise.

## 2018-02-22 NOTE — Telephone Encounter (Signed)
Mardee Postindvised Catherine to take Zantac only at this time. Santina EvansCatherine verbalized understanding, no further questions.

## 2018-02-22 NOTE — Telephone Encounter (Signed)
Received Physician Orders/F2F  [facility]; forwarded to provider/SLS 04/08

## 2018-02-23 ENCOUNTER — Telehealth: Payer: Self-pay | Admitting: *Deleted

## 2018-02-23 ENCOUNTER — Telehealth: Payer: Self-pay | Admitting: Family Medicine

## 2018-02-23 DIAGNOSIS — N189 Chronic kidney disease, unspecified: Secondary | ICD-10-CM | POA: Diagnosis not present

## 2018-02-23 DIAGNOSIS — I1310 Hypertensive heart and chronic kidney disease without heart failure, with stage 1 through stage 4 chronic kidney disease, or unspecified chronic kidney disease: Secondary | ICD-10-CM | POA: Diagnosis not present

## 2018-02-23 DIAGNOSIS — I2510 Atherosclerotic heart disease of native coronary artery without angina pectoris: Secondary | ICD-10-CM | POA: Diagnosis not present

## 2018-02-23 DIAGNOSIS — Z951 Presence of aortocoronary bypass graft: Secondary | ICD-10-CM | POA: Diagnosis not present

## 2018-02-23 DIAGNOSIS — E1122 Type 2 diabetes mellitus with diabetic chronic kidney disease: Secondary | ICD-10-CM | POA: Diagnosis not present

## 2018-02-23 DIAGNOSIS — Z48812 Encounter for surgical aftercare following surgery on the circulatory system: Secondary | ICD-10-CM | POA: Diagnosis not present

## 2018-02-23 NOTE — Telephone Encounter (Signed)
Copied from CRM [phone]. Topic: Quick Communication - See Telephone Encounter >> Feb 23, 2018 11:56 AM Diana EvesHoyt, Maryann B wrote: CRM for notification. See Telephone encounter for: 02/23/18. Denise RN filling in with [facility] calling in regards to the pt having a productive cough, yellow phlegm, sinus headache and sore throat. Her lungs are clear. They are wanting to know if an antibiotic should be called in for the yellow phlegm or what could be recommended. Angelique BlonderDenise CB# [phone]

## 2018-02-23 NOTE — Telephone Encounter (Signed)
Received Physician Orders from [facility] [place]; forwarded to provider/SLS 04/09

## 2018-02-24 NOTE — Telephone Encounter (Signed)
[facility] to [facility] informed of PCP advice---she will see the patient today and suggest getting an appointment

## 2018-02-24 NOTE — Telephone Encounter (Signed)
Fevers, rigors or abnormal exam findings are more reliable tellers of whether something is bacterial and requires antibiotics. If they aren't sure and she doesn't look good, she needs to be seen. TY.

## 2018-02-26 ENCOUNTER — Telehealth: Payer: Self-pay | Admitting: Family Medicine

## 2018-02-26 DIAGNOSIS — E1122 Type 2 diabetes mellitus with diabetic chronic kidney disease: Secondary | ICD-10-CM | POA: Diagnosis not present

## 2018-02-26 DIAGNOSIS — N189 Chronic kidney disease, unspecified: Secondary | ICD-10-CM | POA: Diagnosis not present

## 2018-02-26 DIAGNOSIS — Z951 Presence of aortocoronary bypass graft: Secondary | ICD-10-CM | POA: Diagnosis not present

## 2018-02-26 DIAGNOSIS — I1310 Hypertensive heart and chronic kidney disease without heart failure, with stage 1 through stage 4 chronic kidney disease, or unspecified chronic kidney disease: Secondary | ICD-10-CM | POA: Diagnosis not present

## 2018-02-26 DIAGNOSIS — I2510 Atherosclerotic heart disease of native coronary artery without angina pectoris: Secondary | ICD-10-CM | POA: Diagnosis not present

## 2018-02-26 DIAGNOSIS — Z48812 Encounter for surgical aftercare following surgery on the circulatory system: Secondary | ICD-10-CM | POA: Diagnosis not present

## 2018-02-26 NOTE — Telephone Encounter (Signed)
Copied from CRM [phone]. Topic: Quick Communication - See Telephone Encounter >> Feb 26, 2018  4:46 PM Raquel SarnaHayes, Teresa G wrote: [facility]ie [facility] - [facility] [phone]  Verbal orders:  Additional nursing visits - observation and assessment

## 2018-03-01 ENCOUNTER — Telehealth: Payer: Self-pay

## 2018-03-01 ENCOUNTER — Emergency Department (HOSPITAL_BASED_OUTPATIENT_CLINIC_OR_DEPARTMENT_OTHER): Payer: Medicare Other

## 2018-03-01 ENCOUNTER — Inpatient Hospital Stay (HOSPITAL_BASED_OUTPATIENT_CLINIC_OR_DEPARTMENT_OTHER)
Admission: EM | Admit: 2018-03-01 | Discharge: 2018-03-03 | DRG: 247 | Disposition: A | Discharge status: Home Health | Payer: Medicare Other | Attending: Cardiology | Admitting: Cardiology

## 2018-03-01 ENCOUNTER — Other Ambulatory Visit: Payer: Self-pay

## 2018-03-01 ENCOUNTER — Encounter (HOSPITAL_BASED_OUTPATIENT_CLINIC_OR_DEPARTMENT_OTHER): Payer: Self-pay | Admitting: Emergency Medicine

## 2018-03-01 DIAGNOSIS — I10 Essential (primary) hypertension: Secondary | ICD-10-CM | POA: Diagnosis present

## 2018-03-01 DIAGNOSIS — Z955 Presence of coronary angioplasty implant and graft: Secondary | ICD-10-CM

## 2018-03-01 DIAGNOSIS — I5041 Acute combined systolic (congestive) and diastolic (congestive) heart failure: Secondary | ICD-10-CM | POA: Diagnosis not present

## 2018-03-01 DIAGNOSIS — E119 Type 2 diabetes mellitus without complications: Secondary | ICD-10-CM | POA: Diagnosis present

## 2018-03-01 DIAGNOSIS — Z794 Long term (current) use of insulin: Secondary | ICD-10-CM

## 2018-03-01 DIAGNOSIS — L299 Pruritus, unspecified: Secondary | ICD-10-CM | POA: Diagnosis not present

## 2018-03-01 DIAGNOSIS — M25512 Pain in left shoulder: Secondary | ICD-10-CM | POA: Diagnosis not present

## 2018-03-01 DIAGNOSIS — I253 Aneurysm of heart: Secondary | ICD-10-CM | POA: Diagnosis present

## 2018-03-01 DIAGNOSIS — E785 Hyperlipidemia, unspecified: Secondary | ICD-10-CM | POA: Diagnosis present

## 2018-03-01 DIAGNOSIS — Z951 Presence of aortocoronary bypass graft: Secondary | ICD-10-CM | POA: Diagnosis not present

## 2018-03-01 DIAGNOSIS — Z7982 Long term (current) use of aspirin: Secondary | ICD-10-CM

## 2018-03-01 DIAGNOSIS — T402X5A Adverse effect of other opioids, initial encounter: Secondary | ICD-10-CM | POA: Diagnosis not present

## 2018-03-01 DIAGNOSIS — R0602 Shortness of breath: Secondary | ICD-10-CM | POA: Diagnosis not present

## 2018-03-01 DIAGNOSIS — I340 Nonrheumatic mitral (valve) insufficiency: Secondary | ICD-10-CM | POA: Diagnosis not present

## 2018-03-01 DIAGNOSIS — R079 Chest pain, unspecified: Secondary | ICD-10-CM | POA: Diagnosis not present

## 2018-03-01 DIAGNOSIS — Z87891 Personal history of nicotine dependence: Secondary | ICD-10-CM

## 2018-03-01 DIAGNOSIS — E7800 Pure hypercholesterolemia, unspecified: Secondary | ICD-10-CM | POA: Diagnosis not present

## 2018-03-01 DIAGNOSIS — R05 Cough: Secondary | ICD-10-CM

## 2018-03-01 DIAGNOSIS — K219 Gastro-esophageal reflux disease without esophagitis: Secondary | ICD-10-CM | POA: Diagnosis present

## 2018-03-01 DIAGNOSIS — I213 ST elevation (STEMI) myocardial infarction of unspecified site: Secondary | ICD-10-CM | POA: Diagnosis present

## 2018-03-01 DIAGNOSIS — R059 Cough, unspecified: Secondary | ICD-10-CM

## 2018-03-01 DIAGNOSIS — T50905A Adverse effect of unspecified drugs, medicaments and biological substances, initial encounter: Secondary | ICD-10-CM | POA: Diagnosis not present

## 2018-03-01 DIAGNOSIS — I214 Non-ST elevation (NSTEMI) myocardial infarction: Secondary | ICD-10-CM | POA: Diagnosis not present

## 2018-03-01 DIAGNOSIS — I2510 Atherosclerotic heart disease of native coronary artery without angina pectoris: Secondary | ICD-10-CM | POA: Diagnosis present

## 2018-03-01 DIAGNOSIS — Z79899 Other long term (current) drug therapy: Secondary | ICD-10-CM

## 2018-03-01 DIAGNOSIS — I110 Hypertensive heart disease with heart failure: Secondary | ICD-10-CM

## 2018-03-01 DIAGNOSIS — M542 Cervicalgia: Secondary | ICD-10-CM | POA: Diagnosis not present

## 2018-03-01 HISTORY — DX: Sleep apnea, unspecified: G47.30

## 2018-03-01 HISTORY — DX: Type 2 diabetes mellitus without complications: E11.9

## 2018-03-01 HISTORY — DX: Gout, unspecified: M10.9

## 2018-03-01 HISTORY — DX: Other chronic pain: G89.29

## 2018-03-01 HISTORY — DX: Non-ST elevation (NSTEMI) myocardial infarction: I21.4

## 2018-03-01 HISTORY — DX: Pneumonia, unspecified organism: J18.9

## 2018-03-01 HISTORY — DX: Cyst of kidney, acquired: N28.1

## 2018-03-01 HISTORY — DX: Dependence on supplemental oxygen: Z99.81

## 2018-03-01 HISTORY — DX: Migraine, unspecified, not intractable, without status migrainosus: G43.909

## 2018-03-01 HISTORY — DX: Low back pain, unspecified: M54.50

## 2018-03-01 HISTORY — DX: Personal history of urinary calculi: Z87.442

## 2018-03-01 HISTORY — DX: Low back pain: M54.5

## 2018-03-01 HISTORY — DX: Presence of artificial eye: Z97.0

## 2018-03-01 LAB — CBC
HCT: 36 % (ref 36.0–46.0)
HEMOGLOBIN: 11.6 g/dL — AB (ref 12.0–15.0)
MCH: 31.3 pg (ref 26.0–34.0)
MCHC: 32.2 g/dL (ref 30.0–36.0)
MCV: 97 fL (ref 78.0–100.0)
Platelets: 180 10*3/uL (ref 150–400)
RBC: 3.71 MIL/uL — AB (ref 3.87–5.11)
RDW: 14.4 % (ref 11.5–15.5)
WBC: 9.3 10*3/uL (ref 4.0–10.5)

## 2018-03-01 LAB — BASIC METABOLIC PANEL
ANION GAP: 5 (ref 5–15)
BUN: 20 mg/dL (ref 6–20)
CALCIUM: 8.4 mg/dL — AB (ref 8.9–10.3)
CHLORIDE: 114 mmol/L — AB (ref 101–111)
CO2: 23 mmol/L (ref 22–32)
Creatinine, Ser: 1.17 mg/dL — ABNORMAL HIGH (ref 0.44–1.00)
GFR calc non Af Amer: 44 mL/min — ABNORMAL LOW (ref >60)
GFR, EST AFRICAN AMERICAN: 51 mL/min — AB (ref >60)
Glucose, Bld: 108 mg/dL — ABNORMAL HIGH (ref 65–99)
Potassium: 4 mmol/L (ref 3.5–5.1)
Sodium: 142 mmol/L (ref 135–145)

## 2018-03-01 LAB — GLUCOSE, CAPILLARY
GLUCOSE-CAPILLARY: 90 mg/dL (ref 65–99)
Glucose-Capillary: 159 mg/dL — ABNORMAL HIGH (ref 65–99)

## 2018-03-01 LAB — TROPONIN I
TROPONIN I: 0.33 ng/mL — AB (ref <0.03)
Troponin I: 0.03 ng/mL (ref <0.03)
Troponin I: 4.69 ng/mL (ref <0.03)

## 2018-03-01 MED ORDER — SODIUM CHLORIDE 0.9 % IV BOLUS
1000.0000 mL | Freq: Once | INTRAVENOUS | Status: AC
  Administered 2018-03-01: 1000 mL via INTRAVENOUS

## 2018-03-01 MED ORDER — INSULIN ASPART 100 UNIT/ML ~~LOC~~ SOLN: 0.0000 [IU] | Freq: Three times a day (TID) | SUBCUTANEOUS | Status: DC

## 2018-03-01 MED ORDER — ACETAMINOPHEN 325 MG PO TABS: 650.0000 mg | ORAL_TABLET | ORAL | Status: DC | PRN

## 2018-03-01 MED ORDER — MORPHINE SULFATE (PF) 4 MG/ML IV SOLN
4.0000 mg | INTRAVENOUS | Status: AC | PRN
  Administered 2018-03-01 (×3): 4 mg via INTRAVENOUS
  Filled 2018-03-01 (×3): qty 1

## 2018-03-01 MED ORDER — DIPHENHYDRAMINE HCL 50 MG/ML IJ SOLN
12.5000 mg | Freq: Once | INTRAMUSCULAR | Status: AC
  Administered 2018-03-01: 12.5 mg via INTRAVENOUS

## 2018-03-01 MED ORDER — ATENOLOL 25 MG PO TABS
50.0000 mg | ORAL_TABLET | Freq: Every day | ORAL | Status: DC
  Filled 2018-03-01: qty 2

## 2018-03-01 MED ORDER — NITROGLYCERIN IN D5W 200-5 MCG/ML-% IV SOLN
0.0000 ug/min | INTRAVENOUS | Status: DC
  Administered 2018-03-01: 10 ug/min via INTRAVENOUS
  Filled 2018-03-01: qty 250

## 2018-03-01 MED ORDER — SODIUM CHLORIDE 0.9 % WEIGHT BASED INFUSION
1.0000 mL/kg/h | INTRAVENOUS | Status: DC
  Administered 2018-03-01 – 2018-03-02 (×2): 1 mL/kg/h via INTRAVENOUS

## 2018-03-01 MED ORDER — MELATONIN 3 MG PO TABS
9.0000 mg | ORAL_TABLET | Freq: Every day | ORAL | Status: DC
  Administered 2018-03-01 – 2018-03-02 (×2): 9 mg via ORAL
  Filled 2018-03-01 (×2): qty 3

## 2018-03-01 MED ORDER — DOCUSATE SODIUM 100 MG PO CAPS: 100.0000 mg | ORAL_CAPSULE | Freq: Two times a day (BID) | ORAL | Status: DC | PRN

## 2018-03-01 MED ORDER — SODIUM CHLORIDE 0.9% FLUSH
3.0000 mL | Freq: Two times a day (BID) | INTRAVENOUS | Status: DC
  Administered 2018-03-02: 3 mL via INTRAVENOUS

## 2018-03-01 MED ORDER — METHOCARBAMOL 500 MG PO TABS
1000.0000 mg | ORAL_TABLET | Freq: Once | ORAL | Status: AC
  Administered 2018-03-01: 1000 mg via ORAL
  Filled 2018-03-01: qty 2

## 2018-03-01 MED ORDER — FAMOTIDINE 20 MG PO TABS
20.0000 mg | ORAL_TABLET | Freq: Every day | ORAL | Status: DC
  Administered 2018-03-01 – 2018-03-03 (×3): 20 mg via ORAL
  Filled 2018-03-01 (×3): qty 1

## 2018-03-01 MED ORDER — SODIUM CHLORIDE 0.9 % IV SOLN: 250.0000 mL | INTRAVENOUS | Status: DC | PRN

## 2018-03-01 MED ORDER — DIPHENHYDRAMINE HCL 50 MG/ML IJ SOLN
INTRAMUSCULAR | Status: AC
  Administered 2018-03-01: 12.5 mg via INTRAVENOUS
  Filled 2018-03-01: qty 1

## 2018-03-01 MED ORDER — ONDANSETRON HCL 4 MG/2ML IJ SOLN: 4.0000 mg | Freq: Four times a day (QID) | INTRAMUSCULAR | Status: DC | PRN

## 2018-03-01 MED ORDER — DIPHENHYDRAMINE HCL 50 MG/ML IJ SOLN
12.5000 mg | Freq: Once | INTRAMUSCULAR | Status: DC
  Filled 2018-03-01: qty 1

## 2018-03-01 MED ORDER — ATORVASTATIN CALCIUM 40 MG PO TABS
40.0000 mg | ORAL_TABLET | Freq: Every day | ORAL | Status: DC
  Administered 2018-03-01 – 2018-03-03 (×3): 40 mg via ORAL
  Filled 2018-03-01 (×3): qty 1

## 2018-03-01 MED ORDER — DILTIAZEM HCL ER COATED BEADS 180 MG PO CP24
180.0000 mg | ORAL_CAPSULE | Freq: Every day | ORAL | Status: DC
  Administered 2018-03-01: 180 mg via ORAL
  Filled 2018-03-01 (×4): qty 1

## 2018-03-01 MED ORDER — IOPAMIDOL (ISOVUE-370) INJECTION 76%
100.0000 mL | Freq: Once | INTRAVENOUS | Status: AC | PRN
  Administered 2018-03-01: 80 mL via INTRAVENOUS

## 2018-03-01 MED ORDER — ALLOPURINOL 100 MG PO TABS
100.0000 mg | ORAL_TABLET | Freq: Every day | ORAL | Status: DC
  Administered 2018-03-01 – 2018-03-03 (×3): 100 mg via ORAL
  Filled 2018-03-01 (×3): qty 1

## 2018-03-01 MED ORDER — CLOPIDOGREL BISULFATE 75 MG PO TABS
75.0000 mg | ORAL_TABLET | Freq: Every day | ORAL | Status: DC
  Administered 2018-03-01 – 2018-03-03 (×3): 75 mg via ORAL
  Filled 2018-03-01 (×3): qty 1

## 2018-03-01 MED ORDER — HYDROXYZINE HCL 10 MG PO TABS
10.0000 mg | ORAL_TABLET | Freq: Once | ORAL | Status: AC
  Administered 2018-03-01: 10 mg via ORAL
  Filled 2018-03-01: qty 1

## 2018-03-01 MED ORDER — FERROUS SULFATE 325 (65 FE) MG PO TABS
325.0000 mg | ORAL_TABLET | Freq: Every day | ORAL | Status: DC
  Administered 2018-03-03: 325 mg via ORAL
  Filled 2018-03-01: qty 1

## 2018-03-01 MED ORDER — NITROGLYCERIN 2 % TD OINT
1.0000 [in_us] | TOPICAL_OINTMENT | Freq: Four times a day (QID) | TRANSDERMAL | Status: DC
  Administered 2018-03-01 (×2): 1 [in_us] via TOPICAL
  Filled 2018-03-01: qty 1
  Filled 2018-03-01: qty 30

## 2018-03-01 MED ORDER — SODIUM CHLORIDE 0.9 % IV SOLN
Freq: Once | INTRAVENOUS | Status: AC
  Administered 2018-03-01: 15:00:00 via INTRAVENOUS

## 2018-03-01 MED ORDER — SODIUM CHLORIDE 0.9% FLUSH: 3.0000 mL | INTRAVENOUS | Status: DC | PRN

## 2018-03-01 MED ORDER — HEPARIN (PORCINE) IN NACL 100-0.45 UNIT/ML-% IJ SOLN
800.0000 [IU]/h | INTRAMUSCULAR | Status: DC
  Administered 2018-03-01: 800 [IU]/h via INTRAVENOUS
  Filled 2018-03-01 (×4): qty 250

## 2018-03-01 MED ORDER — ASPIRIN EC 81 MG PO TBEC
81.0000 mg | DELAYED_RELEASE_TABLET | Freq: Every day | ORAL | Status: DC
  Administered 2018-03-01 – 2018-03-03 (×3): 81 mg via ORAL
  Filled 2018-03-01 (×3): qty 1

## 2018-03-01 MED ORDER — NITROGLYCERIN 0.4 MG SL SUBL: 0.4000 mg | SUBLINGUAL_TABLET | SUBLINGUAL | Status: DC | PRN

## 2018-03-01 MED ORDER — HEPARIN BOLUS VIA INFUSION
4000.0000 [IU] | Freq: Once | INTRAVENOUS | Status: AC
  Administered 2018-03-01: 4000 [IU] via INTRAVENOUS
  Filled 2018-03-01 (×2): qty 4000

## 2018-03-01 MED ORDER — ONDANSETRON HCL 4 MG/2ML IJ SOLN
4.0000 mg | Freq: Once | INTRAMUSCULAR | Status: AC
  Administered 2018-03-01: 4 mg via INTRAVENOUS
  Filled 2018-03-01: qty 2

## 2018-03-01 MED ORDER — VITAMIN C 500 MG PO TABS
500.0000 mg | ORAL_TABLET | Freq: Two times a day (BID) | ORAL | Status: DC
  Administered 2018-03-01 – 2018-03-03 (×4): 500 mg via ORAL
  Filled 2018-03-01 (×4): qty 1

## 2018-03-01 NOTE — Telephone Encounter (Signed)
Noted. Will await records from ED.

## 2018-03-01 NOTE — H&P (Signed)
Cardiology Admission History and Physical:   Patient ID: Margaret Ward; MRN: [id number]; DOB: 14-Feb-1941   Admission date: 03/01/2018  Primary Care Provider: Sharlene Dory, DO Primary Cardiologist: Norman Herrlich, MD  Chief Complaint: Chest pain  Patient Profile:   Margaret Ward is a 77 y.o. 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  Patient was recently admitted [facility] in [place] chest pain.  Workup revealed multivessel CAD subsequently underwent CABG x 3. Daughter brought her back to [place].   Her pre op echo showed " preserved systolic function with a small apical aneurysm".  The patient was seen by Dr. Arnoldo Morale 02/09/18 to establish cardiac care.  Pending evaluation by CT surgery and echocardiogram.  History of Present Illness:   Margaret Ward woke up with left arm pain radiating to her left shoulder and back this morning at 6 AM.   )ngoing since then.  She has severe substernal chest pressure with tightness with minimal ambulation.  Daughter took her to [facility] where workup revealed elevated troponin and transferred to [facility].  Patient had itching after IV morphine >>given Benadryl with improvement. Compliant with medication.  Serum creatinine 1.17.  Potassium normal.  CT angiogram negative for PE but did showed pulmonary nodules.  Chest x-ray unremarkable.  EKG shows sinus rhythm with T wave inversion in lead V1 and V2 which is similar when compared to 02/09/18.  Her symptoms is similar prior to his recent CABG.  Still complaining of 5 out of 10 chest discomfort.  She is on IV heparin.   Past Medical History:  Diagnosis Date  . Arthritis   . Chest pain 02/05/2018  . Diabetes mellitus without complication (HCC)   . GERD (gastroesophageal reflux disease)   . Glass eye    right eye    . Heart disease   . Hyperlipidemia   . Hypertension   . Kidney cysts   . Kidney disease   . Kidney stone     Past Surgical History:  Procedure Laterality Date  . ABDOMINAL HYSTERECTOMY  1970  . BACK SURGERY    . CARDIAC SURGERY    . CHOLECYSTECTOMY  1982  . CORONARY ARTERY BYPASS GRAFT    . TONSILLECTOMY  1953     Medications Prior to Admission: Prior to Admission medications   Medication Sig Start Date End Date Taking? Authorizing Provider  acetaminophen (TYLENOL) 500 MG tablet Take 500 mg by mouth every 6 (six) hours as needed for headache.   Yes [provider]  allopurinol (ZYLOPRIM) 100 MG tablet Take 100 mg by mouth daily.   Yes [provider]  aspirin EC 81 MG tablet Take 81 mg by mouth daily.   Yes [provider]  atenolol (TENORMIN) 50 MG tablet Take 50 mg by mouth daily.   Yes [provider]  atorvastatin (LIPITOR) 40 MG tablet Take 1 tablet by mouth daily. 02/16/18  Yes [provider]  clopidogrel (PLAVIX) 75 MG tablet Take 75 mg by mouth daily.   Yes [provider]  diltiazem (DILACOR XR) 180 MG 24 hr capsule Take 180 mg by mouth daily.   Yes [provider]  docusate sodium (COLACE) 100 MG capsule Take 100 mg by mouth 2 (two) times daily as needed.    Yes [provider]  ferrous sulfate 325 (65 FE) MG tablet Take 325 mg  by mouth daily with breakfast.   Yes [provider]  Insulin Glargine (LANTUS) 100 UNIT/ML Solostar Pen Inject 18 Units into the skin at bedtime.   Yes [provider]  Melatonin 10 MG TABS Take 1 tablet by mouth at bedtime.   Yes [provider]  ranitidine (ZANTAC) 150 MG tablet Take 150 mg by mouth daily.   Yes [provider]  vitamin C (ASCORBIC ACID) 500 MG tablet Take 500 mg by mouth 2 (two) times daily.   Yes [provider]  acetaminophen-codeine (TYLENOL #3) 300-30 MG tablet Take 1-2 tablets by mouth every 6 (six) hours as  needed for moderate pain. Patient not taking: Reported on 03/01/2018 02/04/18   Sharlene Dory, DO  famotidine (PEPCID) 20 MG tablet Take 1/2 every 12 hours    [provider]     Allergies:    Allergies  Allergen Reactions  . Methylprednisolone     Severe cramps legs and arms  . Keflex [Cephalexin] Hives  . Ketorolac Swelling  . Novocain [Procaine] Swelling  . Septra [Sulfamethoxazole-Trimethoprim] Nausea And Vomiting    Social History:   Social History   Socioeconomic History  . Marital status: Married    Spouse name: Not on file  . Number of children: Not on file  . Years of education: Not on file  . Highest education level: Not on file  Occupational History  . Not on file  Social Needs  . Financial resource strain: Not on file  . Food insecurity:    Worry: Not on file    Inability: Not on file  . Transportation needs:    Medical: Not on file    Non-medical: Not on file  Tobacco Use  . Smoking status: Former Games developer  . Smokeless tobacco: Never Used  Substance and Sexual Activity  . Alcohol use: Not Currently    Frequency: Never  . Drug use: Never  . Sexual activity: Not on file  Lifestyle  . Physical activity:    Days per week: Not on file    Minutes per session: Not on file  . Stress: Not on file  Relationships  . Social connections:    Talks on phone: Not on file    Gets together: Not on file    Attends religious service: Not on file    Active member of club or organization: Not on file    Attends meetings of clubs or organizations: Not on file    Relationship status: Not on file  . Intimate partner violence:    Fear of current or ex partner: Not on file    Emotionally abused: Not on file    Physically abused: Not on file    Forced sexual activity: Not on file  Other Topics Concern  . Not on file  Social History Narrative  . Not on file    Family History:   The patient's family history includes Diabetes in her father; Heart  Problems in her mother; Heart disease in her brother; Hypertension in her brother, daughter, daughter, daughter, and daughter; Kidney disease in her brother and sister.    ROS:  Please see the history of present illness.  All other ROS reviewed and negative.     Physical Exam/Data:   Vitals:   03/01/18 1530 03/01/18 1600 03/01/18 1717 03/01/18 1726  BP: 127/64 123/61  130/66  Pulse: 90 79  89  Resp: 17 19  20   Temp:    98.4 F (36.9 C)  TempSrc:    Oral  SpO2: 91% 94%  96%  Weight:   185 lb 12.8 oz (84.3 kg)   Height:   5\' 2"  (1.575 m)    No intake or output data in the 24 hours ending 03/01/18 1731 Filed Weights   03/01/18 1300 03/01/18 1717  Weight: 184 lb (83.5 kg) 185 lb 12.8 oz (84.3 kg)   Body mass index is 33.98 kg/m.  General:  Well nourished, well developed, in no acute distress HEENT: normal Lymph: no adenopathy Neck: no JVD Endocrine:  No thryomegaly Vascular: No carotid bruits; FA pulses 2+ bilaterally without bruits  Cardiac:  normal S1, S2; RRR; no murmur, substernal midline scar Lungs:  clear to auscultation bilaterally, no wheezing, rhonchi or rales  Abd: soft, nontender, no hepatomegaly  Ext: Trace left lower extremity edema Musculoskeletal:  No deformities, BUE and BLE strength normal and equal Skin: warm and dry  Neuro:  CNs 2-12 intact, no focal abnormalities noted Psych:  Normal affect   Relevant CV Studies: As summarized above  Laboratory Data:  Chemistry Recent Labs  Lab 03/01/18 0737  NA 142  K 4.0  CL 114*  CO2 23  GLUCOSE 108*  BUN 20  CREATININE 1.17*  CALCIUM 8.4*  GFRNONAA 44*  GFRAA 51*  ANIONGAP 5    Hematology Recent Labs  Lab 03/01/18 0737  WBC 9.3  RBC 3.71*  HGB 11.6*  HCT 36.0  MCV 97.0  MCH 31.3  MCHC 32.2  RDW 14.4  PLT 180   Cardiac Enzymes Recent Labs  Lab 03/01/18 0737 03/01/18 1038  TROPONINI 0.03* 0.33*   No results for input(s): TROPIPOC in the last 168 hours.  BNPNo results for input(s):  BNP, PROBNP in the last 168 hours.  DDimer No results for input(s): DDIMER in the last 168 hours.  Radiology/Studies:  Dg Chest 2 View  Result Date: 03/01/2018 CLINICAL DATA:  Left arm and shoulder pain and left chest pain beginning this morning. EXAM: CHEST - 2 VIEW COMPARISON:  02/09/2018 FINDINGS: Previous median sternotomy and CABG. Chronic left ventricular prominence. Chronic aortic atherosclerosis and tortuosity. Hiatal hernia again noted. Worsened areas of linear atelectasis in both lower lungs. No effusions. Upper lungs remain clear. No pulmonary edema. IMPRESSION: New/worsened areas of linear atelectasis in both lower lungs. Electronically Signed   By: Paulina Fusi M.D.   On: 03/01/2018 07:58   Ct Angio Chest Pe W And/or Wo Contrast  Result Date: 03/01/2018 CLINICAL DATA:  Chest pain and shortness of breath EXAM: CT ANGIOGRAPHY CHEST WITH CONTRAST TECHNIQUE: Multidetector CT imaging of the chest was performed using the standard protocol during bolus administration of intravenous contrast. Multiplanar CT image reconstructions and MIPs were obtained to evaluate the vascular anatomy. CONTRAST:  80mL ISOVUE-370 IOPAMIDOL (ISOVUE-370) INJECTION 76% COMPARISON:  Plain film from earlier in same day FINDINGS: Cardiovascular: Thoracic aorta demonstrates mild atherosclerotic calcifications. Changes of prior coronary bypass grafting are noted. The pulmonary artery is well visualized with a normal branching pattern. No filling defects to suggest acute pulmonary emboli are identified. Coronary calcifications are seen. No cardiac enlargement is noted. Mediastinum/Nodes: The thoracic inlet demonstrates some calcifications within the left lobe of the thyroid. No definitive nodule is seen. Some fluid is noted within the esophagus although no definitive obstructive changes are seen. Large sliding-type hiatal hernia is noted. No hilar or mediastinal adenopathy is identified. Lungs/Pleura: Lungs are well aerated  bilaterally. Scattered atelectatic changes are noted similar to that seen on recent chest x-ray. There is  a focal 6 mm noncalcified nodule identified in the right upper lobe best seen on image number 22 of series 6. No other significant nodular changes are noted. Upper Abdomen: Changes of prior cholecystectomy. Bilateral renal cysts are seen. Musculoskeletal: Degenerative changes of the thoracic spine are noted. Changes consistent with prior median sternotomy are noted. There are multiple sternal and manubrial fractures identified of uncertain chronicity. Review of the MIP images confirms the above findings. IMPRESSION: No evidence of pulmonary emboli. 6 mm right upper lobe nodule. Non-contrast chest CT at 6-12 months is recommended. If the nodule is stable at time of repeat CT, then future CT at 18-24 months (from today's scan) is considered optional for low-risk patients, but is recommended for high-risk patients. This recommendation follows the consensus statement: Guidelines for Management of Incidental Pulmonary Nodules Detected on CT Images: From the Fleischner Society 2017; Radiology 2017; 284:228-243. Changes consistent with sternal fractures of uncertain chronicity. Correlation to point tenderness is recommended. Aortic Atherosclerosis (ICD10-I70.0). Electronically Signed   By: Alcide CleverMark  Lukens M.D.   On: 03/01/2018 12:23    Assessment and Plan:   1. Non-STEMI with recent CABG - Woke up this morning with left arm pain radiating to shoulder and back.  She did had chest tightness/pressure with ambulation. Symptoms similar to her symptoms prior to CABG. EKG without acute changes.  Troponin I 0.03-->0.33.  Continue IV heparin. -Continue aspirin 81 mg, Plavix 75 mg, atenolol 50 mg and diltiazem 80 mg daily.  2.  Left ventricular aneurysm -Continue to follow  3.  Hyperlipidemia - 02/09/2018: Cholesterol, Total 84; HDL 39; LDL Calculated 27; Triglycerides 90  -LDL at goal.  Continue statin.  4.   Itching -Noted after IV morphine, now improved after Benadryl.  [place] did [place]  However, it was felt it was due to bed sheet.   5.  Diabetes -Sliding scale while here.   Severity of Illness: The appropriate patient status for this patient is INPATIENT. Inpatient status is judged to be reasonable and necessary in order to provide the required intensity of service to ensure the patient's safety. The patient's presenting symptoms, physical exam findings, and initial radiographic and laboratory data in the context of their chronic comorbidities is felt to place them at high risk for further clinical deterioration. Furthermore, it is not anticipated that the patient will be medically stable for discharge from the hospital within 2 midnights of admission. The following factors support the patient status of inpatient.   " The patient's presenting symptoms include Chest pain . " The worrisome physical exam findings include substeral scar " The initial radiographic and laboratory data are worrisome because of elevated troponin  " The chronic co-morbidities include Recent CABG   * I certify that at the point of admission it is my clinical judgment that the patient will require inpatient hospital care spanning beyond 2 midnights from the point of admission due to high intensity of service, high risk for further deterioration and high frequency of surveillance required.*    For questions or updates, please contact [facility] Please consult [email or web address] for contact info under Cardiology/STEMI.    Lorelei PontSigned, Hannah Strader, [place]  03/01/2018 5:31 PM

## 2018-03-01 NOTE — ED Triage Notes (Signed)
Pt having left sided chest pain for two hours.  No sob but cannot take a deep breath due to pain.  Pt had CABG on March 5th.

## 2018-03-01 NOTE — ED Provider Notes (Signed)
[facility] EMERGENCY DEPARTMENT Provider Note   CSN: [id number] Arrival date & time: 03/01/18  [id number]     History   Chief Complaint Chief Complaint  Patient presents with  . Chest Pain    HPI Margaret Ward is a 77 y.o. female.  Chief complaint is left neck, chest, and shoulder pain.  HPI: 77 year old female.  Coronary artery bypass grafting March 5 of this year.  Has recovered well.  She was fine yesterday without symptoms.  She awakened this morning.  Upon movement of her left neck, chest, shoulder, or arm she has pain.  It seems predictable and reproducible with movement.  She has some pain with a "deep breath".  Had "a cold" no fever.  No dyspnea.  But a cough.  Nonproductive.  No exertional chest pain.  No leg pain or swelling.  Compliant with her postoperative medications.  Past Medical History:  Diagnosis Date  . Arthritis   . Chest pain 02/05/2018  . Diabetes mellitus without complication (HCC)   . GERD (gastroesophageal reflux disease)   . Heart disease   . Hyperlipidemia   . Hypertension   . Kidney cysts   . Kidney disease   . Kidney stone     Patient Active Problem List   Diagnosis Date Noted  . CAD (coronary artery disease) 02/08/2018  . Hyperlipidemia 02/08/2018  . Chest pain 02/05/2018  . S/P coronary artery bypass graft x 3 02/04/2018    Past Surgical History:  Procedure Laterality Date  . ABDOMINAL HYSTERECTOMY  1970  . BACK SURGERY    . CARDIAC SURGERY    . CHOLECYSTECTOMY  1982  . CORONARY ARTERY BYPASS GRAFT    . TONSILLECTOMY  1953     OB History   None      Home Medications    Prior to Admission medications   Medication Sig Start Date End Date Taking? Authorizing Provider  acetaminophen-codeine (TYLENOL #3) 300-30 MG tablet Take 1-2 tablets by mouth every 6 (six) hours as needed for moderate pain. 02/04/18   Sharlene DoryWendling, Nicholas Paul, DO  allopurinol (ZYLOPRIM) 100 MG tablet Take 100 mg by mouth daily.    [provider]  aspirin EC 81 MG tablet Take 81 mg by mouth daily.    [provider]  atenolol (TENORMIN) 50 MG tablet Take 50 mg by mouth daily.    [provider]  atorvastatin (LIPITOR) 40 MG tablet Take 1 tablet by mouth daily. 02/16/18   [provider]  clopidogrel (PLAVIX) 75 MG tablet Take 75 mg by mouth daily.    [provider]  diltiazem (DILACOR XR) 180 MG 24 hr capsule Take 180 mg by mouth daily.    [provider]  docusate sodium (COLACE) 100 MG capsule Take 100 mg by mouth 2 (two) times daily.    [provider]  famotidine (PEPCID) 20 MG tablet Take 1/2 every 12 hours    [provider]  ferrous sulfate 325 (65 FE) MG tablet Take 325 mg by mouth daily with breakfast.    [provider]  Insulin Glargine (LANTUS) 100 UNIT/ML Solostar Pen Inject 18 Units into the skin at bedtime.    [provider]  ranitidine (ZANTAC) 150 MG tablet Take 150 mg by mouth daily.    [provider]  vitamin C (ASCORBIC ACID) 500 MG tablet Take 500 mg by mouth 2 (two) times daily.    [provider]    Family History Family History  Problem Relation Age of Onset  . Heart Problems Mother   . Diabetes Father   . Kidney disease Sister   . Heart disease Brother   . Hypertension Brother   . Kidney disease Brother   . Hypertension Daughter   . Hypertension Daughter   . Hypertension Daughter   . Hypertension Daughter     Social History Social History   Tobacco Use  . Smoking status: Former Games developer  . Smokeless tobacco: Never Used  Substance Use Topics  . Alcohol use: Not Currently    Frequency: Never  . Drug use: Never     Allergies   Methylprednisolone; Keflex [cephalexin]; Ketorolac; Novocain [procaine]; and Septra [sulfamethoxazole-trimethoprim]   Review of Systems Review of Systems  Constitutional: Negative for appetite change, chills, diaphoresis, fatigue and fever.  HENT:  Negative for mouth sores, sore throat and trouble swallowing.   Eyes: Negative for visual disturbance.  Respiratory: Negative for cough, chest tightness, shortness of breath and wheezing.   Cardiovascular: Positive for chest pain.  Gastrointestinal: Negative for abdominal distention, abdominal pain, diarrhea, nausea and vomiting.  Endocrine: Negative for polydipsia, polyphagia and polyuria.  Genitourinary: Negative for dysuria, frequency and hematuria.  Musculoskeletal: Positive for neck pain. Negative for gait problem.       Left neck, shoulder, and arm pain  Skin: Negative for color change, pallor and rash.  Neurological: Negative for dizziness, syncope, light-headedness and headaches.  Hematological: Does not bruise/bleed easily.  Psychiatric/Behavioral: Negative for behavioral problems and confusion.     Physical Exam Updated Vital Signs BP (!) 142/74   Pulse 86   Temp 98.1 F (36.7 C) (Oral)   Resp 14   SpO2 95%   Physical Exam  Constitutional: She is oriented to person, place, and time. She appears well-developed and well-nourished. No distress.  77 year old female.  Appears uncomfortable with any movement of her left neck or shoulder.  Is tender across the trapezius into the shoulder.  Pain with elevation of the shoulder.  HENT:  Head: Normocephalic.  Eyes: Pupils are equal, round, and reactive to light. Conjunctivae are normal. No scleral icterus.  Neck: Normal range of motion. Neck supple. No thyromegaly present.  Cardiovascular: Normal rate and regular rhythm. Exam reveals no gallop and no friction rub.  No murmur heard. Tyler Deis.  Sinus on the monitor.  Well-healed sternotomy scar.  Nontender over the sternum.  Pulmonary/Chest: Effort normal and breath sounds normal. No respiratory distress. She has no wheezes. She has no rales.  Does not appear dyspneic.  Clear bilateral breath sounds.  Abdominal: Soft. Bowel sounds are normal. She exhibits no distension. There is no  tenderness. There is no rebound.  Musculoskeletal: Normal range of motion.  Neurological: She is alert and oriented to person, place, and time.  Skin: Skin is warm and dry. No rash noted.  Psychiatric: She has a normal mood and affect. Her behavior is normal.     ED Treatments / Results  Labs (all labs ordered are listed, but only abnormal results are displayed) Labs Reviewed  BASIC METABOLIC PANEL - Abnormal; Notable for the following components:      Result Value   Chloride 114 (*)    Glucose, Bld 108 (*)    Creatinine, Ser 1.17 (*)    Calcium 8.4 (*)    GFR calc non Af Amer 44 (*)    GFR calc Af Amer 51 (*)    All other components within normal limits  CBC - Abnormal; Notable for the following components:  RBC 3.71 (*)    Hemoglobin 11.6 (*)    All other components within normal limits  TROPONIN I - Abnormal; Notable for the following components:   Troponin I 0.03 (*)    All other components within normal limits  TROPONIN I - Abnormal; Notable for the following components:   Troponin I 0.33 (*)    All other components within normal limits    EKG None  Radiology Dg Chest 2 View  Result Date: 03/01/2018 CLINICAL DATA:  Left arm and shoulder pain and left chest pain beginning this morning. EXAM: CHEST - 2 VIEW COMPARISON:  02/09/2018 FINDINGS: Previous median sternotomy and CABG. Chronic left ventricular prominence. Chronic aortic atherosclerosis and tortuosity. Hiatal hernia again noted. Worsened areas of linear atelectasis in both lower lungs. No effusions. Upper lungs remain clear. No pulmonary edema. IMPRESSION: New/worsened areas of linear atelectasis in both lower lungs. Electronically Signed   By: Paulina Fusi M.D.   On: 03/01/2018 07:58   Ct Angio Chest Pe W And/or Wo Contrast  Result Date: 03/01/2018 CLINICAL DATA:  Chest pain and shortness of breath EXAM: CT ANGIOGRAPHY CHEST WITH CONTRAST TECHNIQUE: Multidetector CT imaging of the chest was performed using the  standard protocol during bolus administration of intravenous contrast. Multiplanar CT image reconstructions and MIPs were obtained to evaluate the vascular anatomy. CONTRAST:  80mL ISOVUE-370 IOPAMIDOL (ISOVUE-370) INJECTION 76% COMPARISON:  Plain film from earlier in same day FINDINGS: Cardiovascular: Thoracic aorta demonstrates mild atherosclerotic calcifications. Changes of prior coronary bypass grafting are noted. The pulmonary artery is well visualized with a normal branching pattern. No filling defects to suggest acute pulmonary emboli are identified. Coronary calcifications are seen. No cardiac enlargement is noted. Mediastinum/Nodes: The thoracic inlet demonstrates some calcifications within the left lobe of the thyroid. No definitive nodule is seen. Some fluid is noted within the esophagus although no definitive obstructive changes are seen. Large sliding-type hiatal hernia is noted. No hilar or mediastinal adenopathy is identified. Lungs/Pleura: Lungs are well aerated bilaterally. Scattered atelectatic changes are noted similar to that seen on recent chest x-ray. There is a focal 6 mm noncalcified nodule identified in the right upper lobe best seen on image number 22 of series 6. No other significant nodular changes are noted. Upper Abdomen: Changes of prior cholecystectomy. Bilateral renal cysts are seen. Musculoskeletal: Degenerative changes of the thoracic spine are noted. Changes consistent with prior median sternotomy are noted. There are multiple sternal and manubrial fractures identified of uncertain chronicity. Review of the MIP images confirms the above findings. IMPRESSION: No evidence of pulmonary emboli. 6 mm right upper lobe nodule. Non-contrast chest CT at 6-12 months is recommended. If the nodule is stable at time of repeat CT, then future CT at 18-24 months (from today's scan) is considered optional for low-risk patients, but is recommended for high-risk patients. This recommendation  follows the consensus statement: Guidelines for Management of Incidental Pulmonary Nodules Detected on CT Images: From the Fleischner Society 2017; Radiology 2017; 284:228-243. Changes consistent with sternal fractures of uncertain chronicity. Correlation to point tenderness is recommended. Aortic Atherosclerosis (ICD10-I70.0). Electronically Signed   By: Alcide Clever M.D.   On: 03/01/2018 12:23    Procedures Procedures (including critical care time)  Medications Ordered in ED Medications  morphine 4 MG/ML injection 4 mg (4 mg Intravenous Given 03/01/18 0926)  sodium chloride 0.9 % bolus 1,000 mL (1,000 mLs Intravenous New Bag/Given 03/01/18 1230)  0.9 %  sodium chloride infusion (has no administration in time range)  nitroGLYCERIN (NITROGLYN) 2 % ointment 1 inch (has no administration in time range)  ondansetron (ZOFRAN) injection 4 mg (4 mg Intravenous Given 03/01/18 0811)  methocarbamol (ROBAXIN) tablet 1,000 mg (1,000 mg Oral Given 03/01/18 1032)  iopamidol (ISOVUE-370) 76 % injection 100 mL (80 mLs Intravenous Contrast Given 03/01/18 1204)     Initial Impression / Assessment and Plan / ED Course  I have reviewed the triage vital signs and the nursing notes.  Pertinent labs & imaging results that were available during my care of the patient were reviewed by me and considered in my medical decision making (see chart for details).     EKG: Dictation by myself.  Sinus rhythm.  Flattened T waves laterally without change.  No ST changes.  No ectopy.  Discussion/medical decision making.  By exam and history symptoms seem musculoskeletal.  Will perform x-ray lab testing.  EKG does not show acute changes.  Pain control.  Reevaluation.   12:52 PM Evaluation CT angios negative for dissection, or pulmonary embolus. She has minimal continued pain with movement of her shoulder.  Her anterior chest pain and left arm pain have resolved.  I discussed the case with Dr. Antoine Poche for [facility] cardiology.   He answered my call immediately and immediately except the patient for transfer.  Arrangements are being made.  She was given heparin bolus and started on infusion in the emergency room.  Nitroglycerin was placed.  Remains anterior chest pain-free.  CRITICAL CARE Performed by: Claudean Kinds   Total critical care time: 30 minutes  Critical care time was exclusive of separately billable procedures and treating other patients.  Critical care was necessary to treat or prevent imminent or life-threatening deterioration.  Critical care was time spent personally by me on the following activities: development of treatment plan with patient and/or surrogate as well as nursing, discussions with consultants, evaluation of patient's response to treatment, examination of patient, obtaining history from patient or surrogate, ordering and performing treatments and interventions, ordering and review of laboratory studies, ordering and review of radiographic studies, pulse oximetry and re-evaluation of patient's condition.   Final Clinical Impressions(s) / ED Diagnoses   Final diagnoses:  Chest pain, unspecified type  NSTEMI (non-ST elevated myocardial infarction) [facility])    ED Discharge Orders    None       Rolland Porter, MD 03/01/18 1253

## 2018-03-01 NOTE — Progress Notes (Signed)
ANTICOAGULATION CONSULT NOTE - Initial Consult  Pharmacy Consult for heparin Indication: chest pain/ACS  Allergies  Allergen Reactions  . Methylprednisolone     Severe cramps legs and arms  . Keflex [Cephalexin] Hives  . Ketorolac Swelling  . Novocain [Procaine] Swelling  . Septra [Sulfamethoxazole-Trimethoprim] Nausea And Vomiting    Patient Measurements: Weight: 184 lb (83.5 kg) Heparin Dosing Weight: 68.9  Vital Signs: Temp: 98.1 F (36.7 C) (04/15 0730) Temp Source: Oral (04/15 0730) BP: 142/74 (04/15 1223) Pulse Rate: 86 (04/15 1224)  Labs: Recent Labs    03/01/18 0737 03/01/18 1038  HGB 11.6*  --   HCT 36.0  --   PLT 180  --   CREATININE 1.17*  --   TROPONINI 0.03* 0.33*    Estimated Creatinine Clearance: 40.4 mL/min (A) (by C-G formula based on SCr of 1.17 mg/dL (H)).   Medical History: Past Medical History:  Diagnosis Date  . Arthritis   . Chest pain 02/05/2018  . Diabetes mellitus without complication (HCC)   . GERD (gastroesophageal reflux disease)   . Heart disease   . Hyperlipidemia   . Hypertension   . Kidney cysts   . Kidney disease   . Kidney stone     Medications:  No oral anticoagulants prior to admission. Currently taking antiplatelet Plavix.  Assessment:    77 year old [facility].  PMH includes DM, HTN, HLD, CAD.    Hgb 11.6 and no reports of bleeding.   Goal of Therapy:  Heparin level 0.3-0.7 units/ml Monitor platelets by anticoagulation protocol: Yes   Plan:  - Heparin bolus 4000 units - Heparin drip 800 units   - Heparin level in 8 hours at 2200 - Daily heparin level, CBC, monitor for signs/symptoms of bleeding  Thank you for allowing us to participate in this patients care.  Verlin FesterAmber Symone Cornman, PharmD

## 2018-03-01 NOTE — Telephone Encounter (Signed)
Copied from CRM [phone]. Topic: General - Other >> Mar 01, 2018  1:23 PM Debroah LoopLander, Lumin L wrote: Reason for CRM: Angelique Blonderenise, RN w/, [facility] says the patients daughter called na ambulance and took patient to ER for chest pains early this morning. Please follow up if needed.

## 2018-03-01 NOTE — ED Notes (Signed)
Called to room, pt states she feels "itchy" after morphine.  No rash or airway involvement.  Dr. Fayrene FearingJames notified and orders received.

## 2018-03-01 NOTE — Telephone Encounter (Signed)
[facility]

## 2018-03-02 ENCOUNTER — Inpatient Hospital Stay (HOSPITAL_COMMUNITY): Payer: Medicare Other

## 2018-03-02 ENCOUNTER — Encounter (HOSPITAL_COMMUNITY): Payer: Self-pay | Admitting: Cardiovascular Disease

## 2018-03-02 ENCOUNTER — Inpatient Hospital Stay (HOSPITAL_COMMUNITY)
Admission: EM | Disposition: A | Discharge status: Home Health | Payer: Self-pay | Source: Home / Self Care | Attending: Cardiology

## 2018-03-02 DIAGNOSIS — I2510 Atherosclerotic heart disease of native coronary artery without angina pectoris: Secondary | ICD-10-CM

## 2018-03-02 DIAGNOSIS — T50905A Adverse effect of unspecified drugs, medicaments and biological substances, initial encounter: Secondary | ICD-10-CM

## 2018-03-02 DIAGNOSIS — I340 Nonrheumatic mitral (valve) insufficiency: Secondary | ICD-10-CM

## 2018-03-02 HISTORY — PX: LEFT HEART CATH AND CORS/GRAFTS ANGIOGRAPHY: CATH118250

## 2018-03-02 HISTORY — PX: CORONARY STENT INTERVENTION: CATH118234

## 2018-03-02 LAB — TROPONIN I
TROPONIN I: 7.55 ng/mL — AB (ref <0.03)
Troponin I: 11.11 ng/mL (ref <0.03)

## 2018-03-02 LAB — CBC
HEMATOCRIT: 32.8 % — AB (ref 36.0–46.0)
Hemoglobin: 10.3 g/dL — ABNORMAL LOW (ref 12.0–15.0)
MCH: 30.7 pg (ref 26.0–34.0)
MCHC: 31.4 g/dL (ref 30.0–36.0)
MCV: 97.9 fL (ref 78.0–100.0)
Platelets: 163 10*3/uL (ref 150–400)
RBC: 3.35 MIL/uL — ABNORMAL LOW (ref 3.87–5.11)
RDW: 14.4 % (ref 11.5–15.5)
WBC: 7.8 10*3/uL (ref 4.0–10.5)

## 2018-03-02 LAB — GLUCOSE, CAPILLARY
GLUCOSE-CAPILLARY: 102 mg/dL — AB (ref 65–99)
Glucose-Capillary: 97 mg/dL (ref 65–99)
Glucose-Capillary: 70 mg/dL (ref 65–99)
Glucose-Capillary: 169 mg/dL — ABNORMAL HIGH (ref 65–99)

## 2018-03-02 LAB — POCT ACTIVATED CLOTTING TIME: ACTIVATED CLOTTING TIME: 411 s

## 2018-03-02 LAB — BASIC METABOLIC PANEL
ANION GAP: 7 (ref 5–15)
BUN: 15 mg/dL (ref 6–20)
CALCIUM: 7.8 mg/dL — AB (ref 8.9–10.3)
CO2: 21 mmol/L — AB (ref 22–32)
Chloride: 112 mmol/L — ABNORMAL HIGH (ref 101–111)
Creatinine, Ser: 1.19 mg/dL — ABNORMAL HIGH (ref 0.44–1.00)
GFR calc Af Amer: 50 mL/min — ABNORMAL LOW (ref >60)
GFR calc non Af Amer: 43 mL/min — ABNORMAL LOW (ref >60)
GLUCOSE: 110 mg/dL — AB (ref 65–99)
Potassium: 4.4 mmol/L (ref 3.5–5.1)
Sodium: 140 mmol/L (ref 135–145)

## 2018-03-02 LAB — ECHOCARDIOGRAM COMPLETE
Height: 62 in
Weight: 2992 oz

## 2018-03-02 LAB — HEPARIN LEVEL (UNFRACTIONATED): Heparin Unfractionated: <0.1 IU/mL — ABNORMAL LOW (ref 0.30–0.70)

## 2018-03-02 SURGERY — LEFT HEART CATH AND CORS/GRAFTS ANGIOGRAPHY
Anesthesia: LOCAL

## 2018-03-02 MED ORDER — HEPARIN BOLUS VIA INFUSION
2000.0000 [IU] | Freq: Once | INTRAVENOUS | Status: AC
  Administered 2018-03-02: 2000 [IU] via INTRAVENOUS
  Filled 2018-03-02: qty 2000

## 2018-03-02 MED ORDER — MIDAZOLAM HCL 2 MG/2ML IJ SOLN
INTRAMUSCULAR | Status: AC
  Filled 2018-03-02: qty 2

## 2018-03-02 MED ORDER — VERAPAMIL HCL 2.5 MG/ML IV SOLN
INTRAVENOUS | Status: AC
  Filled 2018-03-02: qty 2

## 2018-03-02 MED ORDER — LIDOCAINE HCL (PF) 1 % IJ SOLN
INTRAMUSCULAR | Status: DC | PRN
  Administered 2018-03-02: 15 mL

## 2018-03-02 MED ORDER — MIDAZOLAM HCL 2 MG/2ML IJ SOLN
INTRAMUSCULAR | Status: DC | PRN
  Administered 2018-03-02: 1 mg via INTRAVENOUS

## 2018-03-02 MED ORDER — LIDOCAINE HCL (PF) 1 % IJ SOLN
INTRAMUSCULAR | Status: AC
  Filled 2018-03-02: qty 30

## 2018-03-02 MED ORDER — HEPARIN (PORCINE) IN NACL 2-0.9 UNIT/ML-% IJ SOLN
INTRAMUSCULAR | Status: AC
  Filled 2018-03-02: qty 500

## 2018-03-02 MED ORDER — ANGIOPLASTY BOOK
Freq: Once | Status: AC
  Administered 2018-03-02: 22:00:00 1
  Filled 2018-03-02: qty 1

## 2018-03-02 MED ORDER — CLOPIDOGREL BISULFATE 300 MG PO TABS
ORAL_TABLET | ORAL | Status: DC | PRN
  Administered 2018-03-02: 300 mg via ORAL

## 2018-03-02 MED ORDER — CLOPIDOGREL BISULFATE 300 MG PO TABS
ORAL_TABLET | ORAL | Status: AC
  Filled 2018-03-02: qty 1

## 2018-03-02 MED ORDER — SODIUM CHLORIDE 0.9 % IV SOLN
INTRAVENOUS | Status: DC | PRN
  Administered 2018-03-02: 1.75 mg/kg/h via INTRAVENOUS

## 2018-03-02 MED ORDER — CAMPHOR-MENTHOL 0.5-0.5 % EX LOTN
TOPICAL_LOTION | CUTANEOUS | Status: DC | PRN
  Administered 2018-03-02: 1 via TOPICAL
  Filled 2018-03-02 (×2): qty 222

## 2018-03-02 MED ORDER — SODIUM CHLORIDE 0.9% FLUSH: 3.0000 mL | Freq: Two times a day (BID) | INTRAVENOUS | Status: DC

## 2018-03-02 MED ORDER — SODIUM CHLORIDE 0.9 % IV SOLN: INTRAVENOUS | Status: AC

## 2018-03-02 MED ORDER — DEXTROSE 50 % IV SOLN
INTRAVENOUS | Status: AC
  Administered 2018-03-02: 25 mL
  Filled 2018-03-02: qty 50

## 2018-03-02 MED ORDER — BIVALIRUDIN TRIFLUOROACETATE 250 MG IV SOLR
INTRAVENOUS | Status: AC
  Filled 2018-03-02: qty 250

## 2018-03-02 MED ORDER — IOHEXOL 350 MG/ML SOLN
INTRAVENOUS | Status: DC | PRN
  Administered 2018-03-02: 165 mL

## 2018-03-02 MED ORDER — LABETALOL HCL 5 MG/ML IV SOLN: 10.0000 mg | INTRAVENOUS | Status: AC | PRN

## 2018-03-02 MED ORDER — HYDRALAZINE HCL 20 MG/ML IJ SOLN: 5.0000 mg | INTRAMUSCULAR | Status: AC | PRN

## 2018-03-02 MED ORDER — HEPARIN (PORCINE) IN NACL 2-0.9 UNIT/ML-% IJ SOLN
INTRAMUSCULAR | Status: AC | PRN
  Administered 2018-03-02 (×2): 500 mL

## 2018-03-02 MED ORDER — SODIUM CHLORIDE 0.9 % IV SOLN: 250.0000 mL | INTRAVENOUS | Status: DC | PRN

## 2018-03-02 MED ORDER — FENTANYL CITRATE (PF) 100 MCG/2ML IJ SOLN
INTRAMUSCULAR | Status: AC
  Filled 2018-03-02: qty 2

## 2018-03-02 MED ORDER — SODIUM CHLORIDE 0.9% FLUSH: 3.0000 mL | INTRAVENOUS | Status: DC | PRN

## 2018-03-02 MED ORDER — HEPARIN (PORCINE) IN NACL 100-0.45 UNIT/ML-% IJ SOLN
1000.0000 [IU]/h | INTRAMUSCULAR | Status: DC
  Filled 2018-03-02: qty 250

## 2018-03-02 MED ORDER — BIVALIRUDIN BOLUS VIA INFUSION - CUPID
INTRAVENOUS | Status: DC | PRN
  Administered 2018-03-02: 63.6 mg via INTRAVENOUS

## 2018-03-02 MED ORDER — FENTANYL CITRATE (PF) 100 MCG/2ML IJ SOLN
INTRAMUSCULAR | Status: DC | PRN
  Administered 2018-03-02: 25 ug via INTRAVENOUS

## 2018-03-02 MED ORDER — ATENOLOL 50 MG PO TABS
25.0000 mg | ORAL_TABLET | Freq: Every day | ORAL | Status: DC
  Administered 2018-03-02: 25 mg via ORAL
  Filled 2018-03-02: qty 1

## 2018-03-02 MED ORDER — INSULIN ASPART 100 UNIT/ML ~~LOC~~ SOLN: 0.0000 [IU] | Freq: Three times a day (TID) | SUBCUTANEOUS | Status: DC

## 2018-03-02 MED ORDER — HEART ATTACK BOUNCING BOOK
Freq: Once | Status: AC
  Administered 2018-03-02: 1
  Filled 2018-03-02: qty 1

## 2018-03-02 SURGICAL SUPPLY — 18 items
BALLN SAPPHIRE 2.0X15 (BALLOONS) ×2
BALLN SAPPHIRE ~~LOC~~ 2.5X12 (BALLOONS) ×2 IMPLANT
BALLOON SAPPHIRE 2.0X15 (BALLOONS) ×1 IMPLANT
CATH INFINITI 5 FR IM (CATHETERS) ×2 IMPLANT
CATH INFINITI 5FR MULTPACK ANG (CATHETERS) ×2 IMPLANT
CATH VISTA GUIDE 6FR XBLAD3.5 (CATHETERS) ×2 IMPLANT
KIT ENCORE 26 ADVANTAGE (KITS) ×2 IMPLANT
KIT HEART LEFT (KITS) ×2 IMPLANT
KIT MICROPUNCTURE NIT STIFF (SHEATH) ×2 IMPLANT
PACK CARDIAC CATHETERIZATION (CUSTOM PROCEDURE TRAY) ×2 IMPLANT
SHEATH AVANTI 11CM 5FR (SHEATH) ×2 IMPLANT
SHEATH AVANTI 11CM 6FR (SHEATH) ×2 IMPLANT
STENT SIERRA 2.25 X 15 MM (Permanent Stent) ×2 IMPLANT
TRANSDUCER W/STOPCOCK (MISCELLANEOUS) ×2 IMPLANT
TUBING CIL FLEX 10 FLL-RA (TUBING) ×2 IMPLANT
WIRE COUGAR XT STRL 190CM (WIRE) ×2 IMPLANT
WIRE EMERALD 3MM-J .035X150CM (WIRE) ×2 IMPLANT
WIRE EMERALD 3MM-J .035X260CM (WIRE) ×2 IMPLANT

## 2018-03-02 NOTE — Progress Notes (Signed)
  Echocardiogram 2D Echocardiogram has been performed.  Margaret Ward, Margaret Ward 03/02/2018, 1:28 PM

## 2018-03-02 NOTE — Progress Notes (Signed)
Pts second Troponin resulted at 7.55. Pt has IV Heparin, IV Ntg infusing and has been without complaints of Chest Pain or pressure. Pt for Cardiac Cath tomorrow. Will continue to monitor. Dierdre HighmanHall, Adynn Caseres Marie, RN

## 2018-03-02 NOTE — Interval H&P Note (Signed)
History and Physical Interval Note:  03/02/2018 11:55 AM  Memory ArgueGeraldine Dixson  has presented today for cardiac cath with the diagnosis of NSTEMI.  The various methods of treatment have been discussed with the patient and family. After consideration of risks, benefits and other options for treatment, the patient has consented to  Procedure(s): LEFT HEART CATH AND CORONARY ANGIOGRAPHY (N/A) as a surgical intervention .  The patient's history has been reviewed, patient examined, no change in status, stable for surgery.  I have reviewed the patient's chart and labs.  Questions were answered to the patient's satisfaction.    Cath Lab Visit (complete for each Cath Lab visit)  Clinical Evaluation Leading to the Procedure:   ACS: Yes.    Non-ACS:    Anginal Classification: CCS IV  Anti-ischemic medical therapy: Maximal Therapy (2 or more classes of medications)  Non-Invasive Test Results: No non-invasive testing performed  Prior CABG: Previous CABG         Verne Carrowhristopher Lataria Courser

## 2018-03-02 NOTE — H&P (View-Only) (Signed)
Progress Note  Patient Name: Margaret ArgueGeraldine Ward Date of Encounter: 03/02/2018  Primary Cardiologist: Norman HerrlichBrian Munley, MD   Subjective   Continues to have mild chest pressure.   Inpatient Medications    Scheduled Meds: . allopurinol  100 mg Oral Daily  . aspirin EC  81 mg Oral Daily  . atenolol  25 mg Oral Daily  . atorvastatin  40 mg Oral Daily  . clopidogrel  75 mg Oral Daily  . diphenhydrAMINE  12.5 mg Intravenous Once  . famotidine  20 mg Oral Daily  . ferrous sulfate  325 mg Oral Q breakfast  . insulin aspart  0-15 Units Subcutaneous TID WC  . Melatonin  9 mg Oral QHS  . sodium chloride flush  3 mL Intravenous Q12H  . vitamin C  500 mg Oral BID   Continuous Infusions: . sodium chloride    . sodium chloride 1 mL/kg/hr (03/01/18 2100)  . heparin 1,000 Units/hr (03/02/18 0912)  . nitroGLYCERIN 10 mcg/min (03/01/18 2243)   PRN Meds: sodium chloride, acetaminophen, docusate sodium, nitroGLYCERIN, ondansetron (ZOFRAN) IV, sodium chloride flush   Vital Signs    Vitals:   03/02/18 0757 03/02/18 0815 03/02/18 0846 03/02/18 0942  BP: 102/73 100/66 (!) 109/50 (!) 135/56  Pulse: 81 73 64   Resp:  17 (!) 22   Temp: 98.5 F (36.9 C)     TempSrc: Oral     SpO2: 100% 100% 100%   Weight:      Height:        Intake/Output Summary (Last 24 hours) at 03/02/2018 1045 Last data filed at 03/02/2018 1000 Gross per 24 hour  Intake 663.77 ml  Output 1050 ml  Net -386.23 ml   Filed Weights   03/01/18 1300 03/01/18 1717 03/02/18 0450  Weight: 184 lb (83.5 kg) 185 lb 12.8 oz (84.3 kg) 187 lb (84.8 kg)    Telemetry    Sinus rhythm.  PVCs.  - Personally Reviewed  ECG    Sinus rhythm.  Rate 76 bpm.  PVC.  Lateral TWI.    Personally Reviewed  Physical Exam   GEN: No acute distress.   Neck: No JVD Cardiac: RRR, no murmurs, rubs, or gallops.  Respiratory: Clear to auscultation bilaterally. GI: Soft, nontender, non-distended  MS: No edema; No deformity. Neuro:  Nonfocal    Psych: Normal affect   Labs    Chemistry Recent Labs  Lab 03/01/18 0737 03/02/18 0554  NA 142 140  K 4.0 4.4  CL 114* 112*  CO2 23 21*  GLUCOSE 108* 110*  BUN 20 15  CREATININE 1.17* 1.19*  CALCIUM 8.4* 7.8*  GFRNONAA 44* 43*  GFRAA 51* 50*  ANIONGAP 5 7     Hematology Recent Labs  Lab 03/01/18 0737 03/02/18 0554  WBC 9.3 7.8  RBC 3.71* 3.35*  HGB 11.6* 10.3*  HCT 36.0 32.8*  MCV 97.0 97.9  MCH 31.3 30.7  MCHC 32.2 31.4  RDW 14.4 14.4  PLT 180 163    Cardiac Enzymes Recent Labs  Lab 03/01/18 1038 03/01/18 1817 03/01/18 2319 03/02/18 0554  TROPONINI 0.33* 4.69* 7.55* 11.11*   No results for input(s): TROPIPOC in the last 168 hours.   BNPNo results for input(s): BNP, PROBNP in the last 168 hours.   DDimer No results for input(s): DDIMER in the last 168 hours.   Radiology    Dg Chest 2 View  Result Date: 03/01/2018 CLINICAL DATA:  Left arm and shoulder pain and left chest pain beginning this  morning. EXAM: CHEST - 2 VIEW COMPARISON:  02/09/2018 FINDINGS: Previous median sternotomy and CABG. Chronic left ventricular prominence. Chronic aortic atherosclerosis and tortuosity. Hiatal hernia again noted. Worsened areas of linear atelectasis in both lower lungs. No effusions. Upper lungs remain clear. No pulmonary edema. IMPRESSION: New/worsened areas of linear atelectasis in both lower lungs. Electronically Signed   By: Paulina Fusi M.D.   On: 03/01/2018 07:58   Ct Angio Chest Pe W And/or Wo Contrast  Result Date: 03/01/2018 CLINICAL DATA:  Chest pain and shortness of breath EXAM: CT ANGIOGRAPHY CHEST WITH CONTRAST TECHNIQUE: Multidetector CT imaging of the chest was performed using the standard protocol during bolus administration of intravenous contrast. Multiplanar CT image reconstructions and MIPs were obtained to evaluate the vascular anatomy. CONTRAST:  80mL ISOVUE-370 IOPAMIDOL (ISOVUE-370) INJECTION 76% COMPARISON:  Plain film from earlier in same day  FINDINGS: Cardiovascular: Thoracic aorta demonstrates mild atherosclerotic calcifications. Changes of prior coronary bypass grafting are noted. The pulmonary artery is well visualized with a normal branching pattern. No filling defects to suggest acute pulmonary emboli are identified. Coronary calcifications are seen. No cardiac enlargement is noted. Mediastinum/Nodes: The thoracic inlet demonstrates some calcifications within the left lobe of the thyroid. No definitive nodule is seen. Some fluid is noted within the esophagus although no definitive obstructive changes are seen. Large sliding-type hiatal hernia is noted. No hilar or mediastinal adenopathy is identified. Lungs/Pleura: Lungs are well aerated bilaterally. Scattered atelectatic changes are noted similar to that seen on recent chest x-ray. There is a focal 6 mm noncalcified nodule identified in the right upper lobe best seen on image number 22 of series 6. No other significant nodular changes are noted. Upper Abdomen: Changes of prior cholecystectomy. Bilateral renal cysts are seen. Musculoskeletal: Degenerative changes of the thoracic spine are noted. Changes consistent with prior median sternotomy are noted. There are multiple sternal and manubrial fractures identified of uncertain chronicity. Review of the MIP images confirms the above findings. IMPRESSION: No evidence of pulmonary emboli. 6 mm right upper lobe nodule. Non-contrast chest CT at 6-12 months is recommended. If the nodule is stable at time of repeat CT, then future CT at 18-24 months (from today's scan) is considered optional for low-risk patients, but is recommended for high-risk patients. This recommendation follows the consensus statement: Guidelines for Management of Incidental Pulmonary Nodules Detected on CT Images: From the Fleischner Society 2017; Radiology 2017; 284:228-243. Changes consistent with sternal fractures of uncertain chronicity. Correlation to point tenderness is  recommended. Aortic Atherosclerosis (ICD10-I70.0). Electronically Signed   By: Alcide Clever M.D.   On: 03/01/2018 12:23    Cardiac Studies   Echo pending  LHC pending  Patient Profile     Ms. Margaret Ward is a 22F with CAD s/p 3 vessel CABG 01/19/18, hypertension, hyperlipidemia, and left ventricular aneurysm here with NSTEMI.  Assessment & Plan    # CAD s/p CABG 01/2018: # NSTEMI:  Troponin continues to trend up to 11 today.  She has intermittent chest pressure.  She is on a nitro drip. She had severe itching after morphine.  LHC in 2 hours.  Echo pending.  We have requested records from her CABG but have not yet received them.  Her daughter will call again. Continue aspirin, clopidogrel, atenolol, atorvastatin, and nitro.  # Hypertension: BP is running low.  Hold diltiazem.  Continue atenolol.  # Itching: Due to morphine.  Will give Sarna lotion.  Benadryl and atarax did not help.  For questions or updates, please  contact [facility] Please consult [email or web address] for contact info under Cardiology/STEMI.      Signed, Chilton Si, MD  03/02/2018, 10:45 AM

## 2018-03-02 NOTE — Progress Notes (Signed)
ANTICOAGULATION CONSULT NOTE  Pharmacy Consult for heparin Indication: chest pain/ACS  Allergies  Allergen Reactions  . Methylprednisolone     Severe cramps legs and arms  . Keflex [Cephalexin] Hives  . Ketorolac Swelling  . Morphine And Related     Itching   . Novocain [Procaine] Swelling  . Septra [Sulfamethoxazole-Trimethoprim] Nausea And Vomiting    Patient Measurements: Height: 5\' 2"  (157.5 cm) Weight: 187 lb (84.8 kg) IBW/kg (Calculated) : 50.1 Heparin Dosing Weight: 68.9  Vital Signs: Temp: 98.5 F (36.9 C) (04/16 0757) Temp Source: Oral (04/16 0757) BP: 109/50 (04/16 0846) Pulse Rate: 64 (04/16 0846)  Labs: Recent Labs    03/01/18 0737  03/01/18 1817 03/01/18 2319 03/02/18 0554  HGB 11.6*  --   --   --  10.3*  HCT 36.0  --   --   --  32.8*  PLT 180  --   --   --  163  HEPARINUNFRC  --   --   --   --  <0.10*  CREATININE 1.17*  --   --   --  1.19*  TROPONINI 0.03*   < > 4.69* 7.55* 11.11*   < > = values in this interval not displayed.    Estimated Creatinine Clearance: 40 mL/min (A) (by C-G formula based on SCr of 1.19 mg/dL (H)).   Medical History: Past Medical History:  Diagnosis Date  . Arthritis   . Chest pain 02/05/2018  . Diabetes mellitus without complication (HCC)   . GERD (gastroesophageal reflux disease)   . Glass eye    right eye  . Heart disease   . Hyperlipidemia   . Hypertension   . Kidney cysts   . Kidney disease   . Kidney stone     Medications:  No oral anticoagulants prior to admission. Currently taking antiplatelet Plavix.  Assessment:    77 year old [facility] with [facility].  PMH includes DM, HTN, HLD, CAD.  [facility] with heparin infusing upon transfer at 800 units/hr.   Heparin level this AM is <0.10 on heparin drip 800 units/hr Hgb 11.6 >10.3 , p[ltc wnl and no reports of bleeding. Heparin IV line was pulled out by pt around 7 am, but AM heparin level was drawn prior to  this time, thus will consider accurate on rate of 800 units/hr.   Goal of Therapy:  Heparin level 0.3-0.7 units/ml Monitor platelets by anticoagulation protocol: Yes   Plan:  - Heparin bolus 2000 units - Increase Heparin drip to 1000 units  - Heparin level in 8 hours - Daily heparin level, CBC, monitor for signs/symptoms of bleeding   Thank you for allowing us to participate in this patients care. Noah Delaineuth Krisa Blattner, RPh Clinical Pharmacist Pager: [phone](8a-330p) [phone] or [phone]-1030p) Main Rx [id number] 03/02/2018 8:56 AM

## 2018-03-02 NOTE — Research (Signed)
AEGIS II Research study protocol reviewed with patient and her daughters. Questions encouraged and answered. ICF left for review. Research will follow up in am.

## 2018-03-02 NOTE — Progress Notes (Signed)
Pt is a difficult stick for IV access. Has had several attempts thus far. Pt has one IV at the time after several attempts. Pt and family requesting Mid-Line or PICC placement. IV Team to evaluate for Mid-line IV. Will continue to monitor. Dierdre HighmanHall, Abayomi Pattison Marie, RN

## 2018-03-02 NOTE — Progress Notes (Signed)
  Echocardiogram 2D Echocardiogram has been performed.  Roosvelt MaserLane, Beverley Allender F 03/02/2018, 4:29 PM

## 2018-03-02 NOTE — Progress Notes (Addendum)
Provider on call B.Bhagat PA notified of Trop 4.69 and pt continues to complain of 5/10 chest pressure.Has Ntg paste intact and IV heparin on board however,  Pt currently is without IV access(accidentially removed) awaiting IV Team to place new IV. Orders received,. Will continue to monitor. Dierdre HighmanHall, Sona Nations Marie, RN

## 2018-03-02 NOTE — Progress Notes (Signed)
Progress Note  Patient Name: Margaret Ward Date of Encounter: 03/02/2018  Primary Cardiologist: Norman HerrlichBrian Munley, MD   Subjective   Continues to have mild chest pressure.   Inpatient Medications    Scheduled Meds: . allopurinol  100 mg Oral Daily  . aspirin EC  81 mg Oral Daily  . atenolol  25 mg Oral Daily  . atorvastatin  40 mg Oral Daily  . clopidogrel  75 mg Oral Daily  . diphenhydrAMINE  12.5 mg Intravenous Once  . famotidine  20 mg Oral Daily  . ferrous sulfate  325 mg Oral Q breakfast  . insulin aspart  0-15 Units Subcutaneous TID WC  . Melatonin  9 mg Oral QHS  . sodium chloride flush  3 mL Intravenous Q12H  . vitamin C  500 mg Oral BID   Continuous Infusions: . sodium chloride    . sodium chloride 1 mL/kg/hr (03/01/18 2100)  . heparin 1,000 Units/hr (03/02/18 0912)  . nitroGLYCERIN 10 mcg/min (03/01/18 2243)   PRN Meds: sodium chloride, acetaminophen, docusate sodium, nitroGLYCERIN, ondansetron (ZOFRAN) IV, sodium chloride flush   Vital Signs    Vitals:   03/02/18 0757 03/02/18 0815 03/02/18 0846 03/02/18 0942  BP: 102/73 100/66 (!) 109/50 (!) 135/56  Pulse: 81 73 64   Resp:  17 (!) 22   Temp: 98.5 F (36.9 C)     TempSrc: Oral     SpO2: 100% 100% 100%   Weight:      Height:        Intake/Output Summary (Last 24 hours) at 03/02/2018 1045 Last data filed at 03/02/2018 1000 Gross per 24 hour  Intake 663.77 ml  Output 1050 ml  Net -386.23 ml   Filed Weights   03/01/18 1300 03/01/18 1717 03/02/18 0450  Weight: 184 lb (83.5 kg) 185 lb 12.8 oz (84.3 kg) 187 lb (84.8 kg)    Telemetry    Sinus rhythm.  PVCs.  - Personally Reviewed  ECG    Sinus rhythm.  Rate 76 bpm.  PVC.  Lateral TWI.    Personally Reviewed  Physical Exam   GEN: No acute distress.   Neck: No JVD Cardiac: RRR, no murmurs, rubs, or gallops.  Respiratory: Clear to auscultation bilaterally. GI: Soft, nontender, non-distended  MS: No edema; No deformity. Neuro:  Nonfocal    Psych: Normal affect   Labs    Chemistry Recent Labs  Lab 03/01/18 0737 03/02/18 0554  NA 142 140  K 4.0 4.4  CL 114* 112*  CO2 23 21*  GLUCOSE 108* 110*  BUN 20 15  CREATININE 1.17* 1.19*  CALCIUM 8.4* 7.8*  GFRNONAA 44* 43*  GFRAA 51* 50*  ANIONGAP 5 7     Hematology Recent Labs  Lab 03/01/18 0737 03/02/18 0554  WBC 9.3 7.8  RBC 3.71* 3.35*  HGB 11.6* 10.3*  HCT 36.0 32.8*  MCV 97.0 97.9  MCH 31.3 30.7  MCHC 32.2 31.4  RDW 14.4 14.4  PLT 180 163    Cardiac Enzymes Recent Labs  Lab 03/01/18 1038 03/01/18 1817 03/01/18 2319 03/02/18 0554  TROPONINI 0.33* 4.69* 7.55* 11.11*   No results for input(s): TROPIPOC in the last 168 hours.   BNPNo results for input(s): BNP, PROBNP in the last 168 hours.   DDimer No results for input(s): DDIMER in the last 168 hours.   Radiology    Dg Chest 2 View  Result Date: 03/01/2018 CLINICAL DATA:  Left arm and shoulder pain and left chest pain beginning this  morning. EXAM: CHEST - 2 VIEW COMPARISON:  02/09/2018 FINDINGS: Previous median sternotomy and CABG. Chronic left ventricular prominence. Chronic aortic atherosclerosis and tortuosity. Hiatal hernia again noted. Worsened areas of linear atelectasis in both lower lungs. No effusions. Upper lungs remain clear. No pulmonary edema. IMPRESSION: New/worsened areas of linear atelectasis in both lower lungs. Electronically Signed   By: Paulina Fusi M.D.   On: 03/01/2018 07:58   Ct Angio Chest Pe W And/or Wo Contrast  Result Date: 03/01/2018 CLINICAL DATA:  Chest pain and shortness of breath EXAM: CT ANGIOGRAPHY CHEST WITH CONTRAST TECHNIQUE: Multidetector CT imaging of the chest was performed using the standard protocol during bolus administration of intravenous contrast. Multiplanar CT image reconstructions and MIPs were obtained to evaluate the vascular anatomy. CONTRAST:  80mL ISOVUE-370 IOPAMIDOL (ISOVUE-370) INJECTION 76% COMPARISON:  Plain film from earlier in same day  FINDINGS: Cardiovascular: Thoracic aorta demonstrates mild atherosclerotic calcifications. Changes of prior coronary bypass grafting are noted. The pulmonary artery is well visualized with a normal branching pattern. No filling defects to suggest acute pulmonary emboli are identified. Coronary calcifications are seen. No cardiac enlargement is noted. Mediastinum/Nodes: The thoracic inlet demonstrates some calcifications within the left lobe of the thyroid. No definitive nodule is seen. Some fluid is noted within the esophagus although no definitive obstructive changes are seen. Large sliding-type hiatal hernia is noted. No hilar or mediastinal adenopathy is identified. Lungs/Pleura: Lungs are well aerated bilaterally. Scattered atelectatic changes are noted similar to that seen on recent chest x-ray. There is a focal 6 mm noncalcified nodule identified in the right upper lobe best seen on image number 22 of series 6. No other significant nodular changes are noted. Upper Abdomen: Changes of prior cholecystectomy. Bilateral renal cysts are seen. Musculoskeletal: Degenerative changes of the thoracic spine are noted. Changes consistent with prior median sternotomy are noted. There are multiple sternal and manubrial fractures identified of uncertain chronicity. Review of the MIP images confirms the above findings. IMPRESSION: No evidence of pulmonary emboli. 6 mm right upper lobe nodule. Non-contrast chest CT at 6-12 months is recommended. If the nodule is stable at time of repeat CT, then future CT at 18-24 months (from today's scan) is considered optional for low-risk patients, but is recommended for high-risk patients. This recommendation follows the consensus statement: Guidelines for Management of Incidental Pulmonary Nodules Detected on CT Images: From the Fleischner Society 2017; Radiology 2017; 284:228-243. Changes consistent with sternal fractures of uncertain chronicity. Correlation to point tenderness is  recommended. Aortic Atherosclerosis (ICD10-I70.0). Electronically Signed   By: Alcide Clever M.D.   On: 03/01/2018 12:23    Cardiac Studies   Echo pending  LHC pending  Patient Profile     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  Assessment & Plan    # CAD s/p CABG 01/2018: # NSTEMI:  Troponin continues to trend up to 11 today.  She has intermittent chest pressure.  She is on a nitro drip. She had severe itching after morphine.  LHC in 2 hours.  Echo pending.  We have requested records from her CABG but have not yet received them.  Her daughter will call again. Continue aspirin, clopidogrel, atenolol, atorvastatin, and nitro.  # Hypertension: BP is running low.  Hold diltiazem.  Continue atenolol.  # Itching: Due to morphine.  Will give Sarna lotion.  Benadryl and atarax did not help.  For questions or updates, please  contact [facility] Please consult [email or web address] for contact info under Cardiology/STEMI.      Signed, Chilton Si, MD  03/02/2018, 10:45 AM

## 2018-03-03 ENCOUNTER — Telehealth: Payer: Self-pay | Admitting: *Deleted

## 2018-03-03 ENCOUNTER — Inpatient Hospital Stay (HOSPITAL_COMMUNITY): Payer: Medicare Other

## 2018-03-03 DIAGNOSIS — I110 Hypertensive heart disease with heart failure: Secondary | ICD-10-CM

## 2018-03-03 DIAGNOSIS — I5041 Acute combined systolic (congestive) and diastolic (congestive) heart failure: Secondary | ICD-10-CM

## 2018-03-03 HISTORY — DX: Hypertensive heart disease with heart failure: I11.0

## 2018-03-03 LAB — CBC
HEMATOCRIT: 35.9 % — AB (ref 36.0–46.0)
HEMOGLOBIN: 11 g/dL — AB (ref 12.0–15.0)
MCH: 29.8 pg (ref 26.0–34.0)
MCHC: 30.6 g/dL (ref 30.0–36.0)
MCV: 97.3 fL (ref 78.0–100.0)
Platelets: 176 10*3/uL (ref 150–400)
RBC: 3.69 MIL/uL — ABNORMAL LOW (ref 3.87–5.11)
RDW: 14.3 % (ref 11.5–15.5)
WBC: 8.5 10*3/uL (ref 4.0–10.5)

## 2018-03-03 LAB — GLUCOSE, CAPILLARY
Glucose-Capillary: 106 mg/dL — ABNORMAL HIGH (ref 65–99)
Glucose-Capillary: 120 mg/dL — ABNORMAL HIGH (ref 65–99)

## 2018-03-03 LAB — BASIC METABOLIC PANEL
ANION GAP: 8 (ref 5–15)
BUN: 13 mg/dL (ref 6–20)
CO2: 23 mmol/L (ref 22–32)
Calcium: 8 mg/dL — ABNORMAL LOW (ref 8.9–10.3)
Chloride: 109 mmol/L (ref 101–111)
Creatinine, Ser: 1.18 mg/dL — ABNORMAL HIGH (ref 0.44–1.00)
GFR calc Af Amer: 50 mL/min — ABNORMAL LOW (ref >60)
GFR calc non Af Amer: 43 mL/min — ABNORMAL LOW (ref >60)
GLUCOSE: 134 mg/dL — AB (ref 65–99)
POTASSIUM: 3.9 mmol/L (ref 3.5–5.1)
Sodium: 140 mmol/L (ref 135–145)

## 2018-03-03 MED ORDER — IPRATROPIUM-ALBUTEROL 0.5-2.5 (3) MG/3ML IN SOLN
3.0000 mL | Freq: Once | RESPIRATORY_TRACT | Status: AC
  Administered 2018-03-03: 11:00:00 3 mL via RESPIRATORY_TRACT
  Filled 2018-03-03: qty 3

## 2018-03-03 MED ORDER — LISINOPRIL 5 MG PO TABS: 2.5000 mg | ORAL_TABLET | Freq: Every day | ORAL | 3 refills | Status: DC

## 2018-03-03 MED ORDER — METOPROLOL SUCCINATE ER 25 MG PO TB24
25.0000 mg | ORAL_TABLET | Freq: Every day | ORAL | Status: DC
  Administered 2018-03-03: 25 mg via ORAL
  Filled 2018-03-03: qty 1

## 2018-03-03 MED ORDER — NITROGLYCERIN 0.4 MG SL SUBL: 0.4000 mg | SUBLINGUAL_TABLET | SUBLINGUAL | 1 refills | Status: AC | PRN

## 2018-03-03 MED ORDER — IPRATROPIUM-ALBUTEROL 20-100 MCG/ACT IN AERS: 2.0000 | INHALATION_SPRAY | Freq: Once | RESPIRATORY_TRACT | Status: DC

## 2018-03-03 MED ORDER — METOPROLOL SUCCINATE ER 25 MG PO TB24: 25.0000 mg | ORAL_TABLET | Freq: Every day | ORAL | 3 refills | Status: DC

## 2018-03-03 MED ORDER — LISINOPRIL 5 MG PO TABS
2.5000 mg | ORAL_TABLET | Freq: Every day | ORAL | Status: DC
  Administered 2018-03-03: 2.5 mg via ORAL
  Filled 2018-03-03: qty 1

## 2018-03-03 MED FILL — Heparin Sodium (Porcine) 2 Unit/ML in Sodium Chloride 0.9%: INTRAMUSCULAR | Qty: 1000 | Status: AC

## 2018-03-03 NOTE — Progress Notes (Signed)
CARDIAC REHAB PHASE I   PRE:  Rate/Rhythm: 87 SR  BP:  Supine: 120/64  Sitting:   Standing:    SaO2: 96%RA  MODE:  Ambulation: 400 ft   POST:  Rate/Rhythm: 104 ST  BP:  Supine:   Sitting: 121/65  Standing:    SaO2: 94%RA 0900-1015 Assisted to bathroom after removing purewick. Pt incontinent of small amount of urine. Helped her to get cleaned up and then walked 400 ft on RA with her walker. Pt with some SOB but sats good. To recliner after walk. Pt's daughter present for ed. Pt coughed on and off during ed. Encouraged her to hold her pillow when coughing due to recent CABG and to use her IS at home ten times an hour. Notified cardiologist of pt's congestion and coughing. Reviewed MI restrictions, NTG use, plavix with stent and carb counting with diabetic diet. Gave heart healthy diet for reference but discussed mainly good carbs. Encouraged walking as tolerated with her walker. She has been getting HHPT when she needs. Will refer to [place] CRP 2 but pt stated cannot do because of knee problems. Was not doing much walking prior to surgery per daughter. Asked RN to get case manager to see [facility].   Luetta Nuttingharlene Loise Esguerra, RN BSN  03/03/2018 10:11 AM

## 2018-03-03 NOTE — Discharge Summary (Signed)
Discharge Summary    Patient ID: Margaret Ward,  MRN: [id number], DOB/AGE: 1941/06/10 77 y.o.  Admit date: 03/01/2018 Discharge date: 03/03/2018  Primary Care Provider: Sharlene Dory Primary Cardiologist: Norman Herrlich, MD  Discharge Diagnoses    Principal Problem:   NSTEMI (non-ST elevated myocardial infarction) [facility]) Active Problems:   S/P CABG x 3   CAD (coronary artery disease)   Hyperlipidemia   Essential hypertension   Allergies Allergies  Allergen Reactions  . Methylprednisolone     Severe cramps legs and arms  . Keflex [Cephalexin] Hives  . Ketorolac Swelling  . Morphine And Related     Itching   . Novocain [Procaine] Swelling  . Septra [Sulfamethoxazole-Trimethoprim] Nausea And Vomiting    Diagnostic Studies/Procedures    Echo 03/02/18: Study Conclusions  - Left ventricle: The cavity size was normal. There was mild concentric hypertrophy. Systolic function was mildly to moderately reduced. The estimated ejection fraction was in the range of 40% to 45%. Mild diffuse hypokinesis. There was an increased relative contribution of atrial contraction to ventricular filling. Doppler parameters are consistent with abnormal left ventricular relaxation (grade 1 diastolic dysfunction). - Mitral valve: Calcified annulus. There was mild regurgitation. - Left atrium: The atrium was moderately to severely dilated. - Right atrium: The atrium was moderately dilated.   LHC 03/02/18:  Prox LAD lesion is 100% stenosed.  Ost 1st Diag lesion is 95% stenosed.  Ost Ramus to Ramus lesion is 30% stenosed.  LIMA graft was visualized by angiography and is normal in caliber.  Origin lesion is 100% stenosed.  SVG graft was visualized by angiography and is normal in caliber.  Mid LM to Dist LM lesion is 40% stenosed.  Ost RPDA lesion is 99% stenosed.  Prox RCA to Mid RCA lesion is 40% stenosed.  A drug-eluting stent was successfully  placed using a STENT SIERRA 2.25 X 15 MM.  Post intervention, there is a 0% residual stenosis.  1. 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 The LAD is occluded proximally just beyond the takeoff of the early Diagonal branch. The entire LAD fills from the patent LIMA graft. The Diagonal branch (intermediate branch) has a 95% proximal stenosis. The vein graft that presumably went to this branch is occluded.  3. Successful PTCA/DES x 1 Diagonal (intermediate branch).  4. The Circumflex and another intermediate branch are small to moderate in caliber and have minor plaque.  5. The RCA is a large dominant vessel. There is diffuse 40% stenosis in the proximal/mid and distal RCA. The PDA is small to moderate in caliber. The ostium of the PDA has a 99% stenosis. The PDA fills from the patent vein graft.   Recommendations: Continue DAPT with ASA and Plavix for at least one year. Continue statin and beta blocker. Echo to assess LV function.     History of Present Illness     Margaret Ward is a 77 y.o. female with a history of CAD status post recent CABG 01/19/18, hyperlipidemia, left ventricular aneurysm and left shoulder pain transfer from [facility] with non-STEMI.  Patient was recently admitted [facility] in [place] chest pain.  Workup revealed multivessel CAD subsequently underwent CABG x 3. Daughter brought her back to [place].  Her pre op echo showed " preserved systolic function with a small apical aneurysm".  The patient was seen by Dr. Dulce Sellar 02/09/18 to establish cardiac care.  Pending evaluation by CT surgery and echocardiogram.  Ms. Margaret Ward woke up with left arm pain radiating to her left shoulder and back this morning at 6 AM. Ongoing since then.  She has severe substernal chest pressure with tightness with minimal ambulation.  Daughter took her to [facility] where workup revealed elevated troponin. Sh was  transferred to [facility] for further management.  She had itching after IV morphine >>given Benadryl with improvement. Compliant with medication.  Serum creatinine 1.17.  Potassium normal.  CT angiogram negative for PE but did showed pulmonary nodules.  Chest x-ray unremarkable.  EKG shows sinus rhythm with T wave inversion in lead V1 and V2 which is similar when compared to 02/09/18.  Her symptoms are similar to those prior to her recent CABG.  She continues to complain of 5 out of 10 chest discomfort.  She is on IV heparin.   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 Troponins continued to trend up with peak at 11.11. She was taken to the cath lab on 01/30/18. Heart cath revealed early graft closure of the SVG to diagonal. DES to D1 successfully placed. Echo with newly reduced LVEF of 40-45% and grad 1 DD. Given these new findings, atenolol was transitioned to Toprol 25 mg daily and low dose lisinopril was started.  Cardizem 180 mg daily was discontinued on admission. Will not restart at this time in favor of optimizing medical management of her newly reduced LVEF. She will continue on ASA and plavix for at least one year. She will continue on statin, LDL is below 70.   Productive cough On exam today, she had a productive cough of brown sputum. CXR revealed no acute or infectious process.   _____________  Discharge Vitals Blood pressure (!) 135/57, pulse 96, temperature 97.6 F (36.4 C), resp. rate 13, height 5\' 2"  (1.575 m), weight 188 lb 11.4 oz (85.6 kg), SpO2 99 %.  Filed Weights   03/01/18 1717 03/02/18 0450 03/03/18 0217  Weight: 185 lb 12.8 oz (84.3 kg) 187 lb (84.8 kg) 188 lb 11.4 oz (85.6 kg)    Labs & Radiologic Studies    CBC Recent Labs    03/02/18 0554  03/03/18 0306  WBC 7.8 8.5  HGB 10.3* 11.0*  HCT 32.8* 35.9*  MCV 97.9 97.3  PLT 163 176   Basic Metabolic Panel Recent Labs    16/10/96 0554 03/03/18 0306  NA 140 140  K 4.4 3.9  CL 112* 109  CO2 21* 23  GLUCOSE 110* 134*  BUN 15 13  CREATININE 1.19* 1.18*  CALCIUM 7.8* 8.0*   Liver Function Tests No results for input(s): AST, ALT, ALKPHOS, BILITOT, PROT, ALBUMIN in the last 72 hours. No results for input(s): LIPASE, AMYLASE in the last 72 hours. Cardiac Enzymes Recent Labs    03/01/18 1817 03/01/18 2319 03/02/18 0554  TROPONINI 4.69* 7.55* 11.11*   BNP Invalid input(s): POCBNP D-Dimer No results for input(s): DDIMER in the last 72 hours. Hemoglobin A1C No results for input(s): HGBA1C in the last 72 hours. Fasting Lipid Panel No results for input(s): CHOL, HDL, LDLCALC, TRIG, CHOLHDL, LDLDIRECT in the last 72 hours. Thyroid Function Tests No results for input(s): TSH, T4TOTAL, T3FREE, THYROIDAB in the last 72 hours.  Invalid input(s): FREET3 _____________  Dg Chest 1 View  Result Date:  03/03/2018 CLINICAL DATA:  Post cath, cough. EXAM: CHEST  1 VIEW COMPARISON:  Chest radiograph 03/01/2018. FINDINGS: Cardiomegaly with calcified tortuous aorta. Median sternotomy and CABG. Linear atelectasis in both lung zones, stable. No edema or consolidation. IMPRESSION: Stable chest.  No consolidation or edema. Electronically Signed   By: Elsie StainJohn T Curnes M.D.   On: 03/03/2018 12:24   Dg Chest 2 View  Result Date: 03/01/2018 CLINICAL DATA:  Left arm and shoulder pain and left chest pain beginning this morning. EXAM: CHEST - 2 VIEW COMPARISON:  02/09/2018 FINDINGS: Previous median sternotomy and CABG. Chronic left ventricular prominence. Chronic aortic atherosclerosis and tortuosity. Hiatal hernia again noted. Worsened areas of linear atelectasis in both lower lungs. No effusions. Upper lungs remain clear. No pulmonary edema. IMPRESSION: New/worsened areas of linear atelectasis in  both lower lungs. Electronically Signed   By: Paulina FusiMark  Shogry M.D.   On: 03/01/2018 07:58   Dg Chest 2 View  Result Date: 02/09/2018 CLINICAL DATA:  Recent open heart surgery with left scapula pain EXAM: CHEST - 2 VIEW COMPARISON:  None. FINDINGS: Post sternotomy changes. Minimal scarring at the lingula. Tiny left pleural effusion with atelectasis at the left base. Scarring in the right lower lung. Borderline to mild cardiomegaly. No pneumothorax. Degenerative changes of the spine. IMPRESSION: 1. Post sternotomy changes 2. Tiny left pleural effusion with linear scarring or atelectasis at the lingula and lower lungs. Electronically Signed   By: Jasmine PangKim  Fujinaga M.D.   On: 02/09/2018 15:06   Dg Scapula Left  Result Date: 02/09/2018 CLINICAL DATA:  Recent open heart surgery with left scapula pain EXAM: LEFT SCAPULA - 2+ VIEWS COMPARISON:  None. FINDINGS: Mild AC joint degenerative change. No fracture or malalignment of the left scapula. Glenohumeral degenerative change. IMPRESSION: No acute osseous abnormality. Electronically Signed   By: Jasmine PangKim  Fujinaga M.D.   On: 02/09/2018 15:07   Ct Angio Chest Pe W And/or Wo Contrast  Result Date: 03/01/2018 CLINICAL DATA:  Chest pain and shortness of breath EXAM: CT ANGIOGRAPHY CHEST WITH CONTRAST TECHNIQUE: Multidetector CT imaging of the chest was performed using the standard protocol during bolus administration of intravenous contrast. Multiplanar CT image reconstructions and MIPs were obtained to evaluate the vascular anatomy. CONTRAST:  80mL ISOVUE-370 IOPAMIDOL (ISOVUE-370) INJECTION 76% COMPARISON:  Plain film from earlier in same day FINDINGS: Cardiovascular: Thoracic aorta demonstrates mild atherosclerotic calcifications. Changes of prior coronary bypass grafting are noted. The pulmonary artery is well visualized with a normal branching pattern. No filling defects to suggest acute pulmonary emboli are identified. Coronary calcifications are seen. No cardiac  enlargement is noted. Mediastinum/Nodes: The thoracic inlet demonstrates some calcifications within the left lobe of the thyroid. No definitive nodule is seen. Some fluid is noted within the esophagus although no definitive obstructive changes are seen. Large sliding-type hiatal hernia is noted. No hilar or mediastinal adenopathy is identified. Lungs/Pleura: Lungs are well aerated bilaterally. Scattered atelectatic changes are noted similar to that seen on recent chest x-ray. There is a focal 6 mm noncalcified nodule identified in the right upper lobe best seen on image number 22 of series 6. No other significant nodular changes are noted. Upper Abdomen: Changes of prior cholecystectomy. Bilateral renal cysts are seen. Musculoskeletal: Degenerative changes of the thoracic spine are noted. Changes consistent with prior median sternotomy are noted. There are multiple sternal and manubrial fractures identified of uncertain chronicity. Review of the MIP images confirms the above findings. IMPRESSION: No evidence of pulmonary emboli. 6 mm right upper lobe nodule.  Non-contrast chest CT at 6-12 months is recommended. If the nodule is stable at time of repeat CT, then future CT at 18-24 months (from today's scan) is considered optional for low-risk patients, but is recommended for high-risk patients. This recommendation follows the consensus statement: Guidelines for Management of Incidental Pulmonary Nodules Detected on CT Images: From the Fleischner Society 2017; Radiology 2017; 284:228-243. Changes consistent with sternal fractures of uncertain chronicity. Correlation to point tenderness is recommended. Aortic Atherosclerosis (ICD10-I70.0). Electronically Signed   By: Alcide Clever M.D.   On: 03/01/2018 12:23   Disposition   Pt is being discharged home today in good condition.  Follow-up Plans & Appointments    Follow-up Information    [facility] Follow up.   Specialty:  Home Health  Services Why:  Home Health RN and Physical Therapy-agency will call to schedule appointment Contact information: [address] [place] [address] [phone]        Baldo Daub, MD Follow up on 03/09/2018.   Specialty:  Cardiology Why:  8:20 AM Contact information: [address] [place] [address] [phone]          Discharge Instructions    Amb Referral to Cardiac Rehabilitation   Complete by:  As directed    Diagnosis:   NSTEMI Coronary Stents     Diet - low sodium heart healthy   Complete by:  As directed    Discharge instructions   Complete by:  As directed    No driving for 2 days. No lifting over 5 lbs for 1 week. No sexual activity for 1 week. You may return to work in 1 week. Keep procedure site clean & dry. If you notice increased pain, swelling, bleeding or pus, call/return!  You may shower, but no soaking baths/hot tubs/pools for 1 week.   Increase activity slowly   Complete by:  As directed       Discharge Medications   Allergies as of 03/03/2018      Reactions   Methylprednisolone    Severe cramps legs and arms   Keflex [cephalexin] Hives   Ketorolac Swelling   Morphine And Related    Itching    Novocain [procaine] Swelling   Septra [sulfamethoxazole-trimethoprim] Nausea And Vomiting      Medication List    STOP taking these medications   atenolol 50 MG tablet Commonly known as:  TENORMIN   diltiazem 180 MG 24 hr capsule Commonly known as:  DILACOR XR     TAKE these medications   acetaminophen 500 MG tablet Commonly known as:  TYLENOL Take 500 mg by mouth every 6 (six) hours as needed for headache.   acetaminophen-codeine 300-30 MG tablet Commonly known as:  TYLENOL #3 Take 1-2 tablets by mouth every 6 (six) hours as needed for moderate pain.   allopurinol 100 MG tablet Commonly known as:  ZYLOPRIM Take 100 mg by mouth daily.   aspirin EC 81 MG tablet Take 81 mg by mouth daily.    atorvastatin 40 MG tablet Commonly known as:  LIPITOR Take 1 tablet by mouth daily.   clopidogrel 75 MG tablet Commonly known as:  PLAVIX Take 75 mg by mouth daily.   docusate sodium 100 MG capsule Commonly known as:  COLACE Take 100 mg by mouth 2 (two) times daily as needed.   famotidine 20 MG tablet Commonly known as:  PEPCID Take 1/2 every 12 hours   ferrous sulfate 325 (65  FE) MG tablet Take 325 mg by mouth daily with breakfast.   Insulin Glargine 100 UNIT/ML Solostar Pen Commonly known as:  LANTUS Inject 18 Units into the skin at bedtime.   lisinopril 5 MG tablet Commonly known as:  PRINIVIL,ZESTRIL Take 0.5 tablets (2.5 mg total) by mouth daily. Start taking on:  03/04/2018   Melatonin 10 MG Tabs Take 1 tablet by mouth at bedtime.   metoprolol succinate 25 MG 24 hr tablet Commonly known as:  TOPROL-XL Take 1 tablet (25 mg total) by mouth daily. Start taking on:  03/04/2018   nitroGLYCERIN 0.4 MG SL tablet Commonly known as:  NITROSTAT Place 1 tablet (0.4 mg total) under the tongue every 5 (five) minutes x 3 doses as needed for chest pain.   ranitidine 150 MG tablet Commonly known as:  ZANTAC Take 150 mg by mouth daily.   vitamin C 500 MG tablet Commonly known as:  ASCORBIC ACID Take 500 mg by mouth 2 (two) times daily.        Aspirin prescribed at discharge?  Yes High Intensity Statin Prescribed? (Lipitor 40-80mg  or Crestor 20-40mg ): Yes Beta Blocker Prescribed? Yes For EF <40%, was ACEI/ARB Prescribed? Yes ADP Receptor Inhibitor Prescribed? (i.e. Plavix etc.-Includes Medically Managed Patients): Yes For EF <40%, Aldosterone Inhibitor Prescribed? No: marginal pressure Was EF assessed during THIS hospitalization? Yes Was Cardiac Rehab II ordered? (Included Medically managed Patients): Yes   Outstanding Labs/Studies     Duration of Discharge Encounter   Greater than 30 minutes including physician time.  Signed, Roe Rutherford Josaphine Shimamoto NP 03/03/2018,  12:40 PM

## 2018-03-03 NOTE — Telephone Encounter (Signed)
[facility] forwarded to provider/SLS 04/17

## 2018-03-03 NOTE — Progress Notes (Signed)
Progress Note  Patient Name: Margaret Ward Date of Encounter: 03/03/2018  Primary Cardiologist: Norman Herrlich, MD   Subjective   Feeling much better.  No chest pain or shortness of breath.  +cough productive of brown sputum. No fever/chills.   Inpatient Medications    Scheduled Meds: . allopurinol  100 mg Oral Daily  . aspirin EC  81 mg Oral Daily  . atenolol  25 mg Oral Daily  . atorvastatin  40 mg Oral Daily  . clopidogrel  75 mg Oral Daily  . diphenhydrAMINE  12.5 mg Intravenous Once  . famotidine  20 mg Oral Daily  . ferrous sulfate  325 mg Oral Q breakfast  . insulin aspart  0-15 Units Subcutaneous TID WC  . Melatonin  9 mg Oral QHS  . sodium chloride flush  3 mL Intravenous Q12H  . vitamin C  500 mg Oral BID   Continuous Infusions: . sodium chloride     PRN Meds: sodium chloride, acetaminophen, camphor-menthol, docusate sodium, nitroGLYCERIN, ondansetron (ZOFRAN) IV, sodium chloride flush   Vital Signs    Vitals:   03/02/18 2000 03/02/18 2017 03/03/18 0217 03/03/18 0748  BP:  131/61 128/90 112/77  Pulse: 75 73 70 84  Resp: 16 10 13 12   Temp:  98.6 F (37 C) 98.3 F (36.8 C) 97.7 F (36.5 C)  TempSrc:  Oral Oral Oral  SpO2: 96% 97% 99% 97%  Weight:   188 lb 11.4 oz (85.6 kg)   Height:        Intake/Output Summary (Last 24 hours) at 03/03/2018 1003 Last data filed at 03/03/2018 0931 Gross per 24 hour  Intake 1040.97 ml  Output 1950 ml  Net -909.03 ml   Filed Weights   03/01/18 1717 03/02/18 0450 03/03/18 0217  Weight: 185 lb 12.8 oz (84.3 kg) 187 lb (84.8 kg) 188 lb 11.4 oz (85.6 kg)    Telemetry    Sinus rhythm.  PVCs.  - Personally Reviewed  ECG    Sinus rhythm.  Rate 76 bpm.  PVC.  Lateral TWI.    Personally Reviewed  Physical Exam   VS:  BP 112/77 (BP Location: Left Arm)   Pulse 84   Temp 97.7 F (36.5 C) (Oral)   Resp 12   Ht 5\' 2"  (1.575 m)   Wt 188 lb 11.4 oz (85.6 kg)   SpO2 97%   BMI 34.52 kg/m  , BMI Body mass index is  34.52 kg/m. GENERAL:  Well appearing HEENT: Pupils equal round and reactive, fundi not visualized, oral mucosa unremarkable NECK:  No jugular venous distention, waveform within normal limits, carotid upstroke brisk and symmetric LUNGS:  Clear to auscultation bilaterally HEART:  RRR.  PMI not displaced or sustained,S1 and S2 within normal limits, no S3, no S4, no clicks, no rubs, no murmurs ABD:  Flat, positive bowel sounds normal in frequency in pitch, no bruits, no rebound, no guarding, no midline pulsatile mass, no hepatomegaly, no splenomegaly EXT:  2 plus pulses throughout, no edema, no cyanosis no clubbing SKIN:  No rashes no nodules NEURO:  Cranial nerves II through XII grossly intact, motor grossly intact throughout PSYCH:  Cognitively intact, oriented to person place and time   Labs    Chemistry Recent Labs  Lab 03/01/18 0737 03/02/18 0554 03/03/18 0306  NA 142 140 140  K 4.0 4.4 3.9  CL 114* 112* 109  CO2 23 21* 23  GLUCOSE 108* 110* 134*  BUN 20 15 13   CREATININE 1.17*  1.19* 1.18*  CALCIUM 8.4* 7.8* 8.0*  GFRNONAA 44* 43* 43*  GFRAA 51* 50* 50*  ANIONGAP 5 7 8      Hematology Recent Labs  Lab 03/01/18 0737 03/02/18 0554 03/03/18 0306  WBC 9.3 7.8 8.5  RBC 3.71* 3.35* 3.69*  HGB 11.6* 10.3* 11.0*  HCT 36.0 32.8* 35.9*  MCV 97.0 97.9 97.3  MCH 31.3 30.7 29.8  MCHC 32.2 31.4 30.6  RDW 14.4 14.4 14.3  PLT 180 163 176    Cardiac Enzymes Recent Labs  Lab 03/01/18 1038 03/01/18 1817 03/01/18 2319 03/02/18 0554  TROPONINI 0.33* 4.69* 7.55* 11.11*   No results for input(s): TROPIPOC in the last 168 hours.   BNPNo results for input(s): BNP, PROBNP in the last 168 hours.   DDimer No results for input(s): DDIMER in the last 168 hours.   Radiology    Ct Angio Chest Pe W And/or Wo Contrast  Result Date: 03/01/2018 CLINICAL DATA:  Chest pain and shortness of breath EXAM: CT ANGIOGRAPHY CHEST WITH CONTRAST TECHNIQUE: Multidetector CT imaging of the chest  was performed using the standard protocol during bolus administration of intravenous contrast. Multiplanar CT image reconstructions and MIPs were obtained to evaluate the vascular anatomy. CONTRAST:  80mL ISOVUE-370 IOPAMIDOL (ISOVUE-370) INJECTION 76% COMPARISON:  Plain film from earlier in same day FINDINGS: Cardiovascular: Thoracic aorta demonstrates mild atherosclerotic calcifications. Changes of prior coronary bypass grafting are noted. The pulmonary artery is well visualized with a normal branching pattern. No filling defects to suggest acute pulmonary emboli are identified. Coronary calcifications are seen. No cardiac enlargement is noted. Mediastinum/Nodes: The thoracic inlet demonstrates some calcifications within the left lobe of the thyroid. No definitive nodule is seen. Some fluid is noted within the esophagus although no definitive obstructive changes are seen. Large sliding-type hiatal hernia is noted. No hilar or mediastinal adenopathy is identified. Lungs/Pleura: Lungs are well aerated bilaterally. Scattered atelectatic changes are noted similar to that seen on recent chest x-ray. There is a focal 6 mm noncalcified nodule identified in the right upper lobe best seen on image number 22 of series 6. No other significant nodular changes are noted. Upper Abdomen: Changes of prior cholecystectomy. Bilateral renal cysts are seen. Musculoskeletal: Degenerative changes of the thoracic spine are noted. Changes consistent with prior median sternotomy are noted. There are multiple sternal and manubrial fractures identified of uncertain chronicity. Review of the MIP images confirms the above findings. IMPRESSION: No evidence of pulmonary emboli. 6 mm right upper lobe nodule. Non-contrast chest CT at 6-12 months is recommended. If the nodule is stable at time of repeat CT, then future CT at 18-24 months (from today's scan) is considered optional for low-risk patients, but is recommended for high-risk patients.  This recommendation follows the consensus statement: Guidelines for Management of Incidental Pulmonary Nodules Detected on CT Images: From the Fleischner Society 2017; Radiology 2017; 284:228-243. Changes consistent with sternal fractures of uncertain chronicity. Correlation to point tenderness is recommended. Aortic Atherosclerosis (ICD10-I70.0). Electronically Signed   By: Alcide CleverMark  Lukens M.D.   On: 03/01/2018 12:23    Cardiac Studies   Echo 03/02/18: Study Conclusions  - Left ventricle: The cavity size was normal. There was mild   concentric hypertrophy. Systolic function was mildly to   moderately reduced. The estimated ejection fraction was in the   range of 40% to 45%. Mild diffuse hypokinesis. There was an   increased relative contribution of atrial contraction to   ventricular filling. Doppler parameters are consistent with   abnormal  left ventricular relaxation (grade 1 diastolic   dysfunction). - Mitral valve: Calcified annulus. There was mild regurgitation. - Left atrium: The atrium was moderately to severely dilated. - Right atrium: The atrium was moderately dilated.  LHC 03/02/18:  Prox LAD lesion is 100% stenosed.  Ost 1st Diag lesion is 95% stenosed.  Ost Ramus to Ramus lesion is 30% stenosed.  LIMA graft was visualized by angiography and is normal in caliber.  Origin lesion is 100% stenosed.  SVG graft was visualized by angiography and is normal in caliber.  Mid LM to Dist LM lesion is 40% stenosed.  Ost RPDA lesion is 99% stenosed.  Prox RCA to Mid RCA lesion is 40% stenosed.  A drug-eluting stent was successfully placed using a STENT SIERRA 2.25 X 15 MM.  Post intervention, there is a 0% residual stenosis.   1. 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 The LAD is occluded proximally just beyond the takeoff of the early Diagonal branch. The entire LAD fills from the patent LIMA graft. The Diagonal branch (intermediate branch) has a  95% proximal stenosis. The vein graft that presumably went to this branch is occluded.  3. Successful PTCA/DES x 1 Diagonal (intermediate branch).  4. The Circumflex and another intermediate branch are small to moderate in caliber and have minor plaque.  5. The RCA is a large dominant vessel. There is diffuse 40% stenosis in the proximal/mid and distal RCA. The PDA is small to moderate in caliber. The ostium of the PDA has a 99% stenosis. The PDA fills from the patent vein graft.   Recommendations: Continue DAPT with ASA and Plavix for at least one year. Continue statin and beta blocker. Echo to assess LV function.   Patient Profile     Ms. Fojtik is a 5F with CAD s/p 3 vessel CABG 01/19/18, hypertension, hyperlipidemia, and left ventricular aneurysm here with NSTEMI.  Assessment & Plan    # CAD s/p CABG 01/2018: # NSTEMI:  # Essential hypertension: # Hyperlipidemia: Early graft closure noted in the SVG to the diagonal.  D1 was successfully PCI'd.  She is feeling great and had immediate relief after PCI.  Continue aspirin and clopidogrel.  Switch atenolol to metoprolol succinate 25mg  daily given newly reduced LVEF.  Diltiazem was stopped on admission.  WIll also add lisinopril 2.5mg  daily and titrate as able.  Continue atorvastatin.  # Productive cough: No fever/chills.  WIll check CXR.  There was no consolidation on CT on admission.    For questions or updates, please contact [facility] Please consult [email or web address] for contact info under Cardiology/STEMI.      Signed, Chilton Si, MD  03/03/2018, 10:03 AM

## 2018-03-03 NOTE — Research (Signed)
[employer] Research study follow up: Patient states she will not be staying in [place] and does not want to participate in the research study.

## 2018-03-03 NOTE — Care Management Note (Signed)
Case Management Note  Patient Details  Name: Margaret Ward MRN: [id number] Date of Birth: 1941-01-29  Subjective/Objective:        S/p CABG 01/2018, NSTEMI            Action/Plan: NCM spoke to pt and dtr at bedside. [facility]. Notified attending for resumption of care orders. Contacted [facility] to make aware of scheduled dc home today with HH. Has RW and cane at home.   Expected Discharge Date:                  Expected Discharge Plan:  Home w Home Health Services  In-House Referral:  NA  Discharge planning Services  CM Consult  Post Acute Care Choice:  Home Health Choice offered to:  Patient, Adult Children  DME Arranged:  N/A DME Agency:  NA  HH Arranged:  PT, RN HH Agency:  [facility]  Status of Service:  Completed, signed off  If discussed at Long Length of Stay Meetings, dates discussed:    Additional Comments:  Elliot CousinShavis, Rolan Wrightsman Ellen, RN 03/03/2018, 10:38 AM

## 2018-03-03 NOTE — Progress Notes (Signed)
   03/03/18 1144  Clinical Encounter Type  Visited With Patient and family together  Visit Type Initial  Referral From Nurse;Patient;Social work  Consult/Referral To MetallurgistChaplain  Spiritual Encounters  Spiritual Needs Literature   [facility].  Patient and daughter were familiar with the documents.  I read over them and we completed and had notarized.   A copy was placed in the patient's chart.  Patient has recently relocated from [place] to be near family with her husband.  Will follow and support as needed. Chaplain Agustin CreeNewton Daquana Paddock

## 2018-03-03 NOTE — Care Management Important Message (Signed)
Important Message  Patient Details  Name: Margaret Ward MRN: [id number] Date of Birth: 05-27-1941   Medicare Important Message Given:  Yes  Signed copy  Elliot CousinShavis, Arthur Speagle Ellen, RN 03/03/2018, 10:54 AM

## 2018-03-05 ENCOUNTER — Telehealth (HOSPITAL_COMMUNITY): Payer: Self-pay

## 2018-03-05 ENCOUNTER — Telehealth: Payer: Self-pay | Admitting: Cardiology

## 2018-03-05 NOTE — Telephone Encounter (Signed)
Please call Selena BattenKim about resumption of care order

## 2018-03-05 NOTE — Telephone Encounter (Signed)
Faxed necessary documents to resume home health.

## 2018-03-05 NOTE — Telephone Encounter (Signed)
Per Phase I - Patient stated cannot do program due to bad knee's. Closed referral.

## 2018-03-06 ENCOUNTER — Emergency Department (HOSPITAL_BASED_OUTPATIENT_CLINIC_OR_DEPARTMENT_OTHER)
Admission: EM | Admit: 2018-03-06 | Discharge: 2018-03-06 | Disposition: A | Discharge status: Home / Self Care | Payer: Medicare Other | Attending: Emergency Medicine | Admitting: Emergency Medicine

## 2018-03-06 ENCOUNTER — Emergency Department (HOSPITAL_BASED_OUTPATIENT_CLINIC_OR_DEPARTMENT_OTHER): Payer: Medicare Other

## 2018-03-06 ENCOUNTER — Encounter (HOSPITAL_BASED_OUTPATIENT_CLINIC_OR_DEPARTMENT_OTHER): Payer: Self-pay | Admitting: Emergency Medicine

## 2018-03-06 ENCOUNTER — Other Ambulatory Visit: Payer: Self-pay

## 2018-03-06 DIAGNOSIS — E119 Type 2 diabetes mellitus without complications: Secondary | ICD-10-CM | POA: Diagnosis not present

## 2018-03-06 DIAGNOSIS — Z87891 Personal history of nicotine dependence: Secondary | ICD-10-CM | POA: Insufficient documentation

## 2018-03-06 DIAGNOSIS — Z79899 Other long term (current) drug therapy: Secondary | ICD-10-CM | POA: Diagnosis not present

## 2018-03-06 DIAGNOSIS — J189 Pneumonia, unspecified organism: Secondary | ICD-10-CM

## 2018-03-06 DIAGNOSIS — Z7982 Long term (current) use of aspirin: Secondary | ICD-10-CM | POA: Insufficient documentation

## 2018-03-06 DIAGNOSIS — Z7901 Long term (current) use of anticoagulants: Secondary | ICD-10-CM | POA: Insufficient documentation

## 2018-03-06 DIAGNOSIS — J3489 Other specified disorders of nose and nasal sinuses: Secondary | ICD-10-CM | POA: Diagnosis not present

## 2018-03-06 DIAGNOSIS — I10 Essential (primary) hypertension: Secondary | ICD-10-CM | POA: Diagnosis not present

## 2018-03-06 DIAGNOSIS — I252 Old myocardial infarction: Secondary | ICD-10-CM | POA: Diagnosis not present

## 2018-03-06 DIAGNOSIS — R05 Cough: Secondary | ICD-10-CM | POA: Diagnosis not present

## 2018-03-06 DIAGNOSIS — J181 Lobar pneumonia, unspecified organism: Secondary | ICD-10-CM | POA: Diagnosis not present

## 2018-03-06 DIAGNOSIS — R0602 Shortness of breath: Secondary | ICD-10-CM | POA: Diagnosis not present

## 2018-03-06 DIAGNOSIS — R079 Chest pain, unspecified: Secondary | ICD-10-CM | POA: Diagnosis not present

## 2018-03-06 LAB — CBC WITH DIFFERENTIAL/PLATELET
BASOS ABS: 0 10*3/uL (ref 0.0–0.1)
BASOS PCT: 0 %
EOS PCT: 1 %
Eosinophils Absolute: 0.1 10*3/uL (ref 0.0–0.7)
HEMATOCRIT: 32.6 % — AB (ref 36.0–46.0)
Hemoglobin: 10.6 g/dL — ABNORMAL LOW (ref 12.0–15.0)
Lymphocytes Relative: 13 %
Lymphs Abs: 1.7 10*3/uL (ref 0.7–4.0)
MCH: 31 pg (ref 26.0–34.0)
MCHC: 32.5 g/dL (ref 30.0–36.0)
MCV: 95.3 fL (ref 78.0–100.0)
MONO ABS: 1.2 10*3/uL — AB (ref 0.1–1.0)
Monocytes Relative: 10 %
NEUTROS ABS: 9.8 10*3/uL — AB (ref 1.7–7.7)
Neutrophils Relative %: 76 %
PLATELETS: 192 10*3/uL (ref 150–400)
RBC: 3.42 MIL/uL — ABNORMAL LOW (ref 3.87–5.11)
RDW: 13.6 % (ref 11.5–15.5)
WBC: 12.8 10*3/uL — ABNORMAL HIGH (ref 4.0–10.5)

## 2018-03-06 LAB — BASIC METABOLIC PANEL
ANION GAP: 9 (ref 5–15)
BUN: 17 mg/dL (ref 6–20)
CALCIUM: 8 mg/dL — AB (ref 8.9–10.3)
CO2: 20 mmol/L — ABNORMAL LOW (ref 22–32)
Chloride: 113 mmol/L — ABNORMAL HIGH (ref 101–111)
Creatinine, Ser: 1.25 mg/dL — ABNORMAL HIGH (ref 0.44–1.00)
GFR, EST AFRICAN AMERICAN: 47 mL/min — AB (ref >60)
GFR, EST NON AFRICAN AMERICAN: 40 mL/min — AB (ref >60)
Glucose, Bld: 143 mg/dL — ABNORMAL HIGH (ref 65–99)
Potassium: 3.7 mmol/L (ref 3.5–5.1)
SODIUM: 142 mmol/L (ref 135–145)

## 2018-03-06 LAB — I-STAT CG4 LACTIC ACID, ED: LACTIC ACID, VENOUS: 0.99 mmol/L (ref 0.5–1.9)

## 2018-03-06 MED ORDER — AMOXICILLIN-POT CLAVULANATE 875-125 MG PO TABS
1.0000 | ORAL_TABLET | Freq: Once | ORAL | Status: AC
  Administered 2018-03-06: 1 via ORAL
  Filled 2018-03-06: qty 1

## 2018-03-06 MED ORDER — AMOXICILLIN-POT CLAVULANATE 875-125 MG PO TABS: 2.0000 | ORAL_TABLET | Freq: Two times a day (BID) | ORAL | 0 refills | Status: AC

## 2018-03-06 NOTE — ED Notes (Signed)
2 attempts made for blood work. Unsuccessful

## 2018-03-06 NOTE — ED Notes (Signed)
Rx x 1 for augmentin given

## 2018-03-06 NOTE — ED Triage Notes (Signed)
Has had a cough x 4 weeks but is now unable to sleep due to cough. Was here on Monday for chest pain and had a cardiac cath and stent placed. Having chest pain that worsens with cough

## 2018-03-06 NOTE — ED Provider Notes (Signed)
[facility] EMERGENCY DEPARTMENT Provider Note  CSN: [id number] Arrival date & time: 03/06/18 1017  Chief Complaint(s) Cough  HPI Margaret Ward is a 77 y.o. female    Cough  This is a new problem. Episode onset: 3 weeks. The problem occurs every few minutes. The problem has been gradually worsening. The cough is productive of purulent sputum. There has been no fever. Associated symptoms include chest pain (with coughing only), chills, rhinorrhea and myalgias. Pertinent negatives include no ear congestion. Associated symptoms comments: Nasal congestion . She has tried nothing for the symptoms. She is not a smoker.    Of note patient was recently discharged after having an occlusion of 1 of the grafts from a previous CABG requiring stenting.  Patient was discharged 3 days ago.  States that her cough began several weeks prior.  Endorses sick contacts at home with fever respiratory symptoms but 1 to 2 weeks ago.  Able to tolerate oral hydration.  Past Medical History Past Medical History:  Diagnosis Date  . Arthritis    "hands, arms, all over the place" (03/02/2018)  . Chest pain 02/05/2018  . Chronic lower back pain   . GERD (gastroesophageal reflux disease)   . Glass eye    right eye  . Gout    "on daily RX" (03/02/2018)  . Heart disease   . History of kidney stones   . Hyperlipidemia   . Hypertension   . Kidney cysts   . Kidney disease   . Migraine    "used to get them twice/week; haven't had them in a long time" (03/02/2018)  . NSTEMI (non-ST elevated myocardial infarction) (HCC) 03/01/2018  . On home oxygen therapy    "3L at night and prn during the day" (03/02/2018)  . Pneumonia    used to get it twice/year; stopped after I had hysterectomy" (03/02/2018)  . Sleep apnea   . Type II diabetes mellitus [place])    Patient Active Problem List   Diagnosis Date Noted  . Essential hypertension 03/03/2018  . NSTEMI (non-ST elevated myocardial infarction) (HCC)  03/01/2018  . CAD (coronary artery disease) 02/08/2018  . Hyperlipidemia 02/08/2018  . Chest pain 02/05/2018  . 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   Home Medication(s) Prior to Admission medications   Medication Sig Start Date End Date Taking? Authorizing Provider  acetaminophen (TYLENOL) 500 MG tablet Take 500 mg by mouth every 6 (six) hours as needed for headache.    [provider]  acetaminophen-codeine (TYLENOL #3) 300-30 MG tablet Take 1-2 tablets by mouth every 6 (six) hours as needed for moderate pain. Patient not taking: Reported on 03/01/2018 02/04/18   Sharlene Dory, DO  allopurinol (ZYLOPRIM) 100 MG tablet Take 100 mg by mouth daily.    [provider]  amoxicillin-clavulanate (AUGMENTIN) 875-125 MG tablet Take 2 tablets by mouth every 12 (twelve) hours for 10 days. 03/06/18 03/16/18  Nira Conn, MD  aspirin EC 81 MG tablet Take 81 mg by mouth daily.    [provider]  atorvastatin (LIPITOR) 40 MG tablet Take 1 tablet by mouth daily. 02/16/18   [provider]  clopidogrel (PLAVIX) 75 MG tablet Take 75 mg by mouth daily.    [provider]  docusate sodium (COLACE) 100 MG capsule Take 100 mg by mouth 2 (two) times daily as needed.     [provider]  famotidine (PEPCID) 20 MG tablet Take 1/2 every 12 hours    [provider]  ferrous  sulfate 325 (65 FE) MG tablet Take 325 mg by mouth daily with breakfast.    [provider]  Insulin Glargine (LANTUS) 100 UNIT/ML Solostar Pen Inject 18 Units into the skin at bedtime.    [provider]  lisinopril (PRINIVIL,ZESTRIL) 5 MG tablet Take 0.5 tablets (2.5 mg total) by mouth daily. 03/04/18   Duke, Roe Rutherford, PA  Melatonin 10 MG TABS Take 1 tablet by mouth at bedtime.    [provider]  metoprolol succinate (TOPROL-XL) 25 MG 24 hr tablet Take 1 tablet (25 mg total) by mouth daily. 03/04/18   D[facility], Roe Rutherford, PA  nitroGLYCERIN  (NITROSTAT) 0.4 MG SL tablet Place 1 tablet (0.4 mg total) under the tongue every 5 (five) minutes x 3 doses as needed for chest pain. 03/03/18   Duke, Roe Rutherford, PA  ranitidine (ZANTAC) 150 MG tablet Take 150 mg by mouth daily.    [provider]  vitamin C (ASCORBIC ACID) 500 MG tablet Take 500 mg by mouth 2 (two) times daily.    [provider]                                                                                                                                    Past Surgical History Past Surgical History:  Procedure Laterality Date  . BACK SURGERY    . CARDIAC CATHETERIZATION  12/2017   "before [facility]"  . CHOLECYSTECTOMY OPEN  1982  . CORONARY ARTERY BYPASS GRAFT  01/19/2018   "CABG X3; in [place]"  . CORONARY STENT INTERVENTION N/A 03/02/2018   Procedure: CORONARY STENT INTERVENTION;  Surgeon: Kathleene Hazel, MD;  Location: [facility] INVASIVE CV LAB;  Service: Cardiovascular;  Laterality: N/A;  . DILATION AND CURETTAGE OF UTERUS    . 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 KNEE ARTHROSCOPY Right X 4  . LEFT HEART CATH AND CORS/GRAFTS ANGIOGRAPHY N/A 03/02/2018   Procedure: LEFT HEART CATH AND CORS/GRAFTS ANGIOGRAPHY;  Surgeon: Kathleene Hazel, MD;  Location: [facility] INVASIVE CV LAB;  Service: Cardiovascular;  Laterality: N/A;  . LUMBAR DISC SURGERY    . REDUCTION MAMMAPLASTY Bilateral X 2  . TONSILLECTOMY  1953  . VAGINAL HYSTERECTOMY  1970   Family History Family History  Problem Relation Age of Onset  . Heart Problems Mother   . Diabetes Father   . Kidney disease Sister   . Heart disease Brother   . Hypertension Brother   . Kidney disease Brother   . Hypertension Daughter   . Hypertension Daughter   . Hypertension Daughter   . Hypertension Daughter     Social History Social History   Tobacco Use  . Smoking status: Former Smoker    Packs/day: 0.12    Years: 3.00    Pack years: 0.36    Types: Cigarettes    Last attempt  to quit: 1963    Years  since quitting: 56.3  . Smokeless tobacco: Never Used  Substance Use Topics  . Alcohol use: Not Currently    Frequency: Never  . Drug use: Never   Allergies Methylprednisolone; Keflex [cephalexin]; Ketorolac; Morphine and related; Novocain [procaine]; and Septra [sulfamethoxazole-trimethoprim]  Review of Systems Review of Systems  Constitutional: Positive for chills.  HENT: Positive for rhinorrhea.   Respiratory: Positive for cough.   Cardiovascular: Positive for chest pain (with coughing only).  Musculoskeletal: Positive for myalgias.   All other systems are reviewed and are negative for acute change except as noted in the HPI  Physical Exam Vital Signs  I have reviewed the triage vital signs BP 134/68   Pulse (!) 105   Temp 98.3 F (36.8 C) (Oral)   Resp 20   Ht 5\' 2"  (1.575 m)   Wt 81.2 kg (179 lb)   SpO2 95%   BMI 32.74 kg/m   Physical Exam  Constitutional: She is oriented to person, place, and time. She appears well-developed and well-nourished. No distress.  HENT:  Head: Normocephalic and atraumatic.  Nose: Nose normal.  Eyes: Pupils are equal, round, and reactive to light. Conjunctivae and EOM are normal. Right eye exhibits no discharge. Left eye exhibits no discharge. No scleral icterus.  Neck: Normal range of motion. Neck supple.  Cardiovascular: Normal rate and regular rhythm. Exam reveals no gallop and no friction rub.  No murmur heard. Pulmonary/Chest: Effort normal. No stridor. No respiratory distress. She has rales in the left middle field and the left lower field.  Abdominal: Soft. She exhibits no distension. There is no tenderness.  Musculoskeletal: She exhibits no edema or tenderness.  Neurological: She is alert and oriented to person, place, and time.  Skin: Skin is warm and dry. No rash noted. She is not diaphoretic. No erythema.  Psychiatric: She has a normal mood and affect.  Vitals reviewed.   ED Results and  Treatments Labs (all labs ordered are listed, but only abnormal results are displayed) Labs Reviewed  CBC WITH DIFFERENTIAL/PLATELET - Abnormal; Notable for the following components:      Result Value   WBC 12.8 (*)    RBC 3.42 (*)    Hemoglobin 10.6 (*)    HCT 32.6 (*)    Neutro Abs 9.8 (*)    Monocytes Absolute 1.2 (*)    All other components within normal limits  BASIC METABOLIC PANEL - Abnormal; Notable for the following components:   Chloride 113 (*)    CO2 20 (*)    Glucose, Bld 143 (*)    Creatinine, Ser 1.25 (*)    Calcium 8.0 (*)    GFR calc non Af Amer 40 (*)    GFR calc Af Amer 47 (*)    All other components within normal limits  I-STAT CG4 LACTIC ACID, ED  EKG  EKG Interpretation  Date/Time:  Saturday March 06 2018 10:32:10 EDT Ventricular Rate:  102 PR Interval:    QRS Duration: 94 QT Interval:  389 QTC Calculation: 507 R Axis:   55 Text Interpretation:  Sinus tachycardia Low voltage, precordial leads Nonspecific T abnormalities, lateral leads Prolonged QT interval Baseline wander in lead(s) V2 Otherwise no significant change Confirmed by Drema Pry [phone]) on 03/06/2018 11:16:48 AM      Radiology Dg Chest 2 View  Result Date: 03/06/2018 CLINICAL DATA:  Cough, congestion shortness of breath for 4 weeks. EXAM: CHEST - 2 VIEW COMPARISON:  Single-view of the chest 03/03/2018. PA and lateral chest 02/09/2018. CT chest 03/01/2018. FINDINGS: Linear opacity in the left lung base has an appearance most suggestive of atelectasis but could represent pneumonia. There is a trace left pleural effusion. Cardiomegaly without edema is seen. The patient is status post CABG. Aortic atherosclerosis noted. No acute bony abnormality. IMPRESSION: Linear opacity left lung base has an appearance most suggestive of atelectasis but could represent pneumonia. Trace left  pleural effusion. Cardiomegaly without edema. Electronically Signed   By: Drusilla Kanner M.D.   On: 03/06/2018 10:59   Pertinent labs & imaging results that were available during my care of the patient were reviewed by me and considered in my medical decision making (see chart for details).  Medications Ordered in ED Medications  amoxicillin-clavulanate (AUGMENTIN) 875-125 MG per tablet 1 tablet (1 tablet Oral Given 03/06/18 1339)                                                                                                                                    Procedures Procedures  (including critical care time)  Medical Decision Making / ED Course I have reviewed the nursing notes for this encounter and the patient's prior records (if available in EHR or on provided paperwork).    Chest pain is atypical for ACS, even given her recent history. It is only with coughing, thus pleurisy vs chest wall pain is most likely.   EKG with tachycardia, but no acute ischemic changes noted.   CXR notable for possible PNA in left lung base. CBC with leukocytosis. Appears that this is bronchitis with new super imposed PNA.  Low Curb 65. Appropriate for OP management. Will treat with Augmentin.   The patient appears reasonably screened and/or stabilized for discharge and I doubt any other medical condition or other [facility] requiring further screening, evaluation, or treatment in the ED at this time prior to discharge.  The patient is safe for discharge with strict return precautions.   Final Clinical Impression(s) / ED Diagnoses Final diagnoses:  Community acquired pneumonia of left lower lobe of lung (HCC)   Disposition: Discharge  Condition: Good  I have discussed the results, Dx and Tx plan with the patient and daughter who expressed understanding and agree(s) with the plan. Discharge instructions discussed at great length. The patient  and daughter were given strict return precautions who  verbalized understanding of the instructions. No further questions at time of discharge.    ED Discharge Orders        Ordered    amoxicillin-clavulanate (AUGMENTIN) 875-125 MG tablet  Every 12 hours     03/06/18 1340       Follow Up: Sharlene DoryWendling, Nicholas Paul, DO [address] [place] [id number] [phone]  Schedule an appointment as soon as possible for a visit  in 5-7 days, If symptoms do not improve or  worsen      This chart was dictated using voice recognition software.  Despite best efforts to proofread,  errors can occur which can change the documentation meaning.   Nira Connardama, Pedro Eduardo, MD 03/06/18 1726

## 2018-03-07 ENCOUNTER — Emergency Department (HOSPITAL_COMMUNITY)
Admission: EM | Admit: 2018-03-07 | Discharge: 2018-03-08 | Disposition: A | Discharge status: Home / Self Care | Payer: Medicare Other | Attending: Emergency Medicine | Admitting: Emergency Medicine

## 2018-03-07 ENCOUNTER — Encounter (HOSPITAL_COMMUNITY): Payer: Self-pay | Admitting: *Deleted

## 2018-03-07 ENCOUNTER — Other Ambulatory Visit: Payer: Self-pay

## 2018-03-07 DIAGNOSIS — Z951 Presence of aortocoronary bypass graft: Secondary | ICD-10-CM | POA: Diagnosis not present

## 2018-03-07 DIAGNOSIS — Z87891 Personal history of nicotine dependence: Secondary | ICD-10-CM | POA: Diagnosis not present

## 2018-03-07 DIAGNOSIS — Z79899 Other long term (current) drug therapy: Secondary | ICD-10-CM | POA: Insufficient documentation

## 2018-03-07 DIAGNOSIS — I252 Old myocardial infarction: Secondary | ICD-10-CM | POA: Diagnosis not present

## 2018-03-07 DIAGNOSIS — I2510 Atherosclerotic heart disease of native coronary artery without angina pectoris: Secondary | ICD-10-CM | POA: Insufficient documentation

## 2018-03-07 DIAGNOSIS — I10 Essential (primary) hypertension: Secondary | ICD-10-CM | POA: Insufficient documentation

## 2018-03-07 DIAGNOSIS — Z7982 Long term (current) use of aspirin: Secondary | ICD-10-CM | POA: Diagnosis not present

## 2018-03-07 DIAGNOSIS — Z7901 Long term (current) use of anticoagulants: Secondary | ICD-10-CM | POA: Diagnosis not present

## 2018-03-07 DIAGNOSIS — Z794 Long term (current) use of insulin: Secondary | ICD-10-CM | POA: Insufficient documentation

## 2018-03-07 DIAGNOSIS — J189 Pneumonia, unspecified organism: Secondary | ICD-10-CM | POA: Diagnosis not present

## 2018-03-07 DIAGNOSIS — J181 Lobar pneumonia, unspecified organism: Secondary | ICD-10-CM | POA: Insufficient documentation

## 2018-03-07 DIAGNOSIS — R059 Cough, unspecified: Secondary | ICD-10-CM

## 2018-03-07 DIAGNOSIS — R05 Cough: Secondary | ICD-10-CM

## 2018-03-07 DIAGNOSIS — I4891 Unspecified atrial fibrillation: Secondary | ICD-10-CM | POA: Diagnosis not present

## 2018-03-07 DIAGNOSIS — R0602 Shortness of breath: Secondary | ICD-10-CM | POA: Diagnosis not present

## 2018-03-07 DIAGNOSIS — E119 Type 2 diabetes mellitus without complications: Secondary | ICD-10-CM | POA: Insufficient documentation

## 2018-03-07 LAB — BASIC METABOLIC PANEL
Anion gap: 11 (ref 5–15)
BUN: 17 mg/dL (ref 6–20)
CHLORIDE: 111 mmol/L (ref 101–111)
CO2: 20 mmol/L — ABNORMAL LOW (ref 22–32)
Calcium: 8 mg/dL — ABNORMAL LOW (ref 8.9–10.3)
Creatinine, Ser: 1.21 mg/dL — ABNORMAL HIGH (ref 0.44–1.00)
GFR calc non Af Amer: 42 mL/min — ABNORMAL LOW (ref >60)
GFR, EST AFRICAN AMERICAN: 49 mL/min — AB (ref >60)
Glucose, Bld: 217 mg/dL — ABNORMAL HIGH (ref 65–99)
POTASSIUM: 3.5 mmol/L (ref 3.5–5.1)
SODIUM: 142 mmol/L (ref 135–145)

## 2018-03-07 LAB — CBC
HEMATOCRIT: 35.5 % — AB (ref 36.0–46.0)
HEMOGLOBIN: 11.4 g/dL — AB (ref 12.0–15.0)
MCH: 30.5 pg (ref 26.0–34.0)
MCHC: 32.1 g/dL (ref 30.0–36.0)
MCV: 94.9 fL (ref 78.0–100.0)
Platelets: 217 10*3/uL (ref 150–400)
RBC: 3.74 MIL/uL — AB (ref 3.87–5.11)
RDW: 14 % (ref 11.5–15.5)
WBC: 10.3 10*3/uL (ref 4.0–10.5)

## 2018-03-07 LAB — I-STAT TROPONIN, ED: Troponin i, poc: 0.6 ng/mL (ref 0.00–0.08)

## 2018-03-07 NOTE — ED Notes (Signed)
Informed Dr Criss AlvineGoldston, CN [place] and RN Baird LyonsCasey in [facility] pt troponin .60

## 2018-03-07 NOTE — ED Notes (Signed)
PA Laveda Normanran made aware of trop. PA would like the Pt to be moved back next.

## 2018-03-07 NOTE — ED Notes (Signed)
Pt left because of wait time.  

## 2018-03-07 NOTE — ED Triage Notes (Signed)
[facility] and dx with PNA. Pt daughter says that she has coughing fits and it is making it difficult for her to breathe. No acute distress in triage. Denies fevers.

## 2018-03-08 ENCOUNTER — Telehealth: Payer: Self-pay | Admitting: *Deleted

## 2018-03-08 ENCOUNTER — Ambulatory Visit (HOSPITAL_BASED_OUTPATIENT_CLINIC_OR_DEPARTMENT_OTHER): Payer: Medicare Other

## 2018-03-08 ENCOUNTER — Emergency Department (HOSPITAL_COMMUNITY): Payer: Medicare Other

## 2018-03-08 DIAGNOSIS — R05 Cough: Secondary | ICD-10-CM | POA: Diagnosis not present

## 2018-03-08 DIAGNOSIS — J181 Lobar pneumonia, unspecified organism: Secondary | ICD-10-CM | POA: Diagnosis not present

## 2018-03-08 LAB — I-STAT TROPONIN, ED: Troponin i, poc: 0.52 ng/mL (ref 0.00–0.08)

## 2018-03-08 MED ORDER — PHENYLEPH-CHLORPHEN-CODEINE 5-2-10 MG/5ML PO SYRP: 5.0000 mL | ORAL_SOLUTION | Freq: Four times a day (QID) | ORAL | 0 refills | Status: DC | PRN

## 2018-03-08 NOTE — Discharge Instructions (Addendum)
Take cough medication as prescribed. Continue Augmentin for pneumonia. It is important that you return to the emergency department if your symptoms worsen. Otherwise, follow up with your doctor for recheck in 1-2 days.

## 2018-03-08 NOTE — ED Notes (Signed)
Dr Rhunette CroftNanavati informed of troponin results .52

## 2018-03-08 NOTE — ED Notes (Signed)
Pt ambulated from room to nursing stations w/o difficulty.  Pt's o2 sat remained above 95% on RA.

## 2018-03-08 NOTE — Progress Notes (Signed)
Cardiology Office Note:    Date:  03/09/2018   ID:  Margaret Ward, DOB December 23, 1940, MRN [id number]  PCP:  Sharlene DoryWendling, Nicholas Paul, DO  Cardiologist:  Norman HerrlichBrian Oyinkansola Truax, MD    Referring MD: Sharlene DoryWendling, Nicholas Paul*    ASSESSMENT:    1. SOB (shortness of breath)   2. Bronchospasm   3. Coronary artery disease involving native coronary artery of native heart without angina pectoris   4. Essential hypertension   5. Sinus tachycardia    PLAN:    In order of problems listed above:  1. Multiple potential etiologies including pneumonia bronchitis and congestive heart failure with edema neck vein distention orthopnea and reduced ejection fraction.  I will have a proBNP level done today and if elevated start her on a loop diuretic.  I asked her to continue her antibiotic and reluctantly gave her a prescription for a bronchodilator that she used in the hospital and she is wheezing on physical examination. 2. New, discontinue beta-blocker started on rate limiting calcium channel blocker and start bronchodilator.  She is arranged to see her PCP later in the week may require steroids if unimproved 3. Stable after recent PCI continue dual antiplatelet therapy and I have stopped her ACE inhibitor with her intense coughing 4. Stable DC beta-blocker DC ACE inhibitor start rate limiting calcium channel blocker 5. See above I think everything is secondary to bronchospasm discontinue beta-blocker start rate limiting calcium channel blocker.  Check BNP level his heart failure may be a precipitant   Next appointment: 2 weeks   Medication Adjustments/Labs and Tests Ordered: Current medicines are reviewed at length with the patient today.  Concerns regarding medicines are outlined above.  Orders Placed This Encounter  Procedures  . Pro b natriuretic peptide (BNP)   Meds ordered this encounter  Medications  . albuterol (PROVENTIL HFA;VENTOLIN HFA) 108 (90 Base) MCG/ACT inhaler    Sig: Inhale 2 puffs  into the lungs every 6 (six) hours as needed for wheezing or shortness of breath.    Dispense:  1 Inhaler    Refill:  2  . diltiazem (CARDIZEM CD) 240 MG 24 hr capsule    Sig: Take 1 capsule (240 mg total) by mouth daily.    Dispense:  30 capsule    Refill:  11    Chief Complaint  Patient presents with  . Follow-up  . Coronary Artery Disease    History of Present Illness:    Margaret ArgueGeraldine Ward is a 77 y.o. female with a hx of CAD, small apical [place] seen 02/09/18. She had recent ACS with SVG occlusion to diagonal with PCI and stent of the native vessel. Her echo EF 40-45%   Principal Problem:   NSTEMI (non-ST elevated myocardial infarction) (HCC) Active Problems:   S/P CABG x 3   CAD (coronary artery disease)   Hyperlipidemia   Essential hypertension  LHC4/16/19:  Prox LAD lesion is 100% stenosed.  Ost 1st Diag lesion is 95% stenosed.  Ost Ramus to Ramus lesion is 30% stenosed.  LIMA graft was visualized by angiography and is normal in caliber.  Origin lesion is 100% stenosed.  SVG graft was visualized by angiography and is normal in caliber.  Mid LM to Dist LM lesion is 40% stenosed.  Ost RPDA lesion is 99% stenosed.  Prox RCA to Mid RCA lesion is 40% stenosed.  A drug-eluting stent was successfully placed using a STENT SIERRA 2.25 X 15 MM.  Post intervention, there is  a 0% residual stenosis. 1. 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 The LAD is occluded proximally just beyond the takeoff of the early Diagonal branch. The entire LAD fills from the patent LIMA graft. The Diagonal branch (intermediate branch) has a 95% proximal stenosis. The vein graft that presumably went to this branch is occluded.  3. Successful PTCA/DES x 1 Diagonal (intermediate branch).  4. The Circumflex and another intermediate branch are small to moderate in caliber and have minor plaque.  5. The RCA is a large  dominant vessel. There is diffuse 40% stenosis in the proximal/mid and distal RCA. The PDA is small to moderate in caliber. The ostium of the PDA has a 99% stenosis. The PDA fills from the patent vein graft.   Echo4/16/19: Study Conclusions - Left ventricle: The cavity size was normal. There was mild concentric hypertrophy. Systolic function was mildly to moderately reduced. The estimated ejection fraction was in the range of 40% to 45%. Mild diffuse hypokinesis. There was an increased relative contribution of atrial contraction to ventricular filling. Doppler parameters are consistent with abnormal left ventricular relaxation (grade 1 diastolic dysfunction). - Mitral valve: Calcified annulus. There was mild regurgitation. - Left atrium: The atrium was moderately to severely dilated. - Right atrium: The atrium was moderately dilated.  She was seen in ED yesterday: Initial Impression / Assessment and Plan / ED Course  I have reviewed the triage vital signs and the nursing notes. Pertinent labs & imaging results that were available during my care of the patient were reviewed by me and considered in my medical decision making (see chart for details). Patient presents for management of cough that becomes intense and makes her feel she cannot breathe. Cough is sporadic, worse with lying down. No fever. Cough x 1 month. Diagnosed with CAP 2 days ago and on Augmentin but given nothing for cough. Recent history of NSTEMI resulting in triple bypass one month ago. PCI one week ago for single graft restenosis. No chest pain. Troponin found to be elevated to 0.60. This is significantly improved when compared to recent values. Repeat troponin is 0.52. No EKG changes. No chest pain.  Patient is ambulated without desaturation. She is intermittently tachycardic with history of chronic a-fib.  She is evaluated by Dr. Rhunette Croft. Discussed admission vs discharge home with the patient and family.  She prefers discharge home and is felt stable and reasonable for dishcarge. Strict return precautions discussed. Will provide codeinated cough medication as the patient prefers codeine to hydrocodone.  CXR: IMPRESSION: Mild bibasilar airspace opacities may reflect atelectasis or mild pneumonia, somewhat more prominent than on the prior study. EKG sinus tachycardia, PVC's, nonspecific T waves Troponin 03/06/18  0.60/ 0.52 Compliance with diet, lifestyle and medications: Yes She is not doing well she continued continues to have cough paroxysms of bronchospasm purulent sputum exercise intolerance she feels her heart races when she does physical effort and she is unable to be supine because of shortness of breath.  She has had no fever with this although she was diagnosed as pneumonia and treated with antibiotics and her ACE inhibitor is a new medication.  She has had no angina or bleeding. Past Medical History:  Diagnosis Date  . Arthritis    "hands, arms, all over the place" (03/02/2018)  . Chest pain 02/05/2018  . Chronic lower back pain   . GERD (gastroesophageal reflux disease)   . Glass eye    right eye  . Gout    "  on daily RX" (03/02/2018)  . Heart disease   . History of kidney stones   . Hyperlipidemia   . Hypertension   . Kidney cysts   . Kidney disease   . Migraine    "used to get them twice/week; haven't had them in a long time" (03/02/2018)  . NSTEMI (non-ST elevated myocardial infarction) (HCC) 03/01/2018  . On home oxygen therapy    "3L at night and prn during the day" (03/02/2018)  . Pneumonia    used to get it twice/year; stopped after I had hysterectomy" (03/02/2018)  . Sleep apnea   . Type II diabetes mellitus (HCC)     Past Surgical History:  Procedure Laterality Date  . BACK SURGERY    . CARDIAC CATHETERIZATION  12/2017   "before [facility]"  . CHOLECYSTECTOMY OPEN  1982  . CORONARY ARTERY BYPASS GRAFT  01/19/2018   "CABG X3; in [place]"  . CORONARY STENT  INTERVENTION N/A 03/02/2018   Procedure: CORONARY STENT INTERVENTION;  Surgeon: Kathleene Hazel, MD;  Location: [facility];  Service: Cardiovascular;  Laterality: N/A;  . DILATION AND CURETTAGE OF UTERUS    . 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 KNEE ARTHROSCOPY Right X 4  . LEFT HEART CATH AND CORS/GRAFTS ANGIOGRAPHY N/A 03/02/2018   Procedure: LEFT HEART CATH AND CORS/GRAFTS ANGIOGRAPHY;  Surgeon: Kathleene Hazel, MD;  Location: [facility];  Service: Cardiovascular;  Laterality: N/A;  . LUMBAR DISC SURGERY    . REDUCTION MAMMAPLASTY Bilateral X 2  . TONSILLECTOMY  1953  . VAGINAL HYSTERECTOMY  1970    Current Medications: Current Meds  Medication Sig  . acetaminophen (TYLENOL) 500 MG tablet Take 500 mg by mouth every 6 (six) hours as needed for headache.  Marland Kitchen acetaminophen-codeine (TYLENOL #3) 300-30 MG tablet Take 1-2 tablets by mouth every 6 (six) hours as needed for moderate pain.  Marland Kitchen allopurinol (ZYLOPRIM) 100 MG tablet Take 100 mg by mouth daily.  Marland Kitchen amoxicillin-clavulanate (AUGMENTIN) 875-125 MG tablet Take 2 tablets by mouth every 12 (twelve) hours for 10 days.  Marland Kitchen aspirin EC 81 MG tablet Take 81 mg by mouth daily.  Marland Kitchen atorvastatin (LIPITOR) 40 MG tablet Take 40 mg by mouth every evening.   . clopidogrel (PLAVIX) 75 MG tablet Take 75 mg by mouth daily.  . ferrous sulfate 325 (65 FE) MG tablet Take 325 mg by mouth daily with breakfast.  . Insulin Glargine (LANTUS) 100 UNIT/ML Solostar Pen Inject 18 Units into the skin at bedtime.  . Melatonin 10 MG TABS Take 1 tablet by mouth at bedtime.  . nitroGLYCERIN (NITROSTAT) 0.4 MG SL tablet Place 1 tablet (0.4 mg total) under the tongue every 5 (five) minutes x 3 doses as needed for chest pain.  Marland Kitchen Phenyleph-Chlorphen-Codeine 03-18-09 MG/5ML SYRP Take 5 mLs by mouth every 6 (six) hours as needed.  . ranitidine (ZANTAC) 150 MG tablet Take 150 mg by mouth daily.  . vitamin C (ASCORBIC ACID) 500 MG tablet  Take 500 mg by mouth 2 (two) times daily.  . [DISCONTINUED] lisinopril (PRINIVIL,ZESTRIL) 5 MG tablet Take 0.5 tablets (2.5 mg total) by mouth daily.  . [DISCONTINUED] metoprolol succinate (TOPROL-XL) 25 MG 24 hr tablet Take 1 tablet (25 mg total) by mouth daily.     Allergies:   Methylprednisolone; Keflex [cephalexin]; Ketorolac; Morphine and related; Novocain [procaine]; and Septra [sulfamethoxazole-trimethoprim]   Social History   Socioeconomic History  . Marital status: Married  Spouse name: Not on file  . Number of children: Not on file  . Years of education: Not on file  . Highest education level: Not on file  Occupational History  . Not on file  Social Needs  . Financial resource strain: Not on file  . Food insecurity:    Worry: Not on file    Inability: Not on file  . Transportation needs:    Medical: Not on file    Non-medical: Not on file  Tobacco Use  . Smoking status: Former Smoker    Packs/day: 0.12    Years: 3.00    Pack years: 0.36    Types: Cigarettes    Last attempt to quit: 1963    Years since quitting: 56.3  . Smokeless tobacco: Never Used  Substance and Sexual Activity  . Alcohol use: Not Currently    Frequency: Never  . Drug use: Never  . Sexual activity: Not on file  Lifestyle  . Physical activity:    Days per week: Not on file    Minutes per session: Not on file  . Stress: Not on file  Relationships  . Social connections:    Talks on phone: Not on file    Gets together: Not on file    Attends religious service: Not on file    Active member of club or organization: Not on file    Attends meetings of clubs or organizations: Not on file    Relationship status: Not on file  Other Topics Concern  . Not on file  Social History Narrative  . Not on file     Family History: The patient's family history includes Diabetes in her father; Heart Problems in her mother; Heart disease in her brother; Hypertension in her brother, daughter, daughter,  daughter, and daughter; Kidney disease in her brother and sister. ROS:   Please see the history of present illness.    All other systems reviewed and are negative.  EKGs/Labs/Other Studies Reviewed:    The following studies were reviewed today:  EKG:  EKG ordered today.  The ekg ordered today demonstrates sinus tachycardia and nonspecific minor ST abnormality.  I repeated an EKG as her initial heart rate was recorded at 128 bpm  EKG 03/08/18 ST 102 BPM  CXR 03/08/18: IMPRESSION: Mild bibasilar airspace opacities may reflect atelectasis or mild pneumonia, somewhat more prominent than on the prior study.  Recent Labs: 02/09/2018: ALT 13 03/07/2018: BUN 17; Creatinine, Ser 1.21; Hemoglobin 11.4; Platelets 217; Potassium 3.5; Sodium 142  Recent Lipid Panel    Component Value Date/Time   CHOL 84 (L) 02/09/2018 1016   TRIG 90 02/09/2018 1016   HDL 39 (L) 02/09/2018 1016   CHOLHDL 2.2 02/09/2018 1016   LDLCALC 27 02/09/2018 1016    Physical Exam:    VS:  BP 122/62 (BP Location: Right Arm, Patient Position: Sitting, Cuff Size: Normal)   Pulse (!) 128   Ht 5\' 2"  (1.575 m)   Wt 178 lb (80.7 kg)   SpO2 98%   BMI 32.56 kg/m     Wt Readings from Last 3 Encounters:  03/09/18 178 lb (80.7 kg)  03/07/18 174 lb (78.9 kg)  03/06/18 179 lb (81.2 kg)     GEN: multiple echymsoes of arms Well nourished, well developed in no acute distress HEENT: Normal NECK:moderate JVD; No carotid bruits LYMPHATICS: No lymphadenopathy CARDIAC: rapid > 100 BPM RRR, no murmurs, rubs, gallops RESPIRATORY: Diminished, expiratory wheezing and prolonfed expiration ABDOMEN: Soft, non-tender, non-distended  MUSCULOSKELETAL:  1-2+ ankle edema edema; No deformity  SKIN: Warm and dry NEUROLOGIC:  Alert and oriented x 3 PSYCHIATRIC:  Normal affect    Signed, Norman Herrlich, MD  03/09/2018 8:52 AM    [facility]

## 2018-03-08 NOTE — ED Provider Notes (Signed)
[facility] EMERGENCY DEPARTMENT Provider Note   CSN: [id number] Arrival date & time: 03/07/18  2038     History   Chief Complaint Chief Complaint  Patient presents with  . Shortness of Breath    HPI Margaret Ward is a 77 y.o. female.  Patient with a history of CAD, recent triple CABG (01/19/18 in [place]), single graft restenosis one week ago with PCI, cough x one month with diagnosis PNA 2 days ago, presents with episodes of coughing that causes difficulty breathing. Cough is worse with lying down. No fever. She denies chest pain or SoB except when having a cough episode. No vomiting.   The history is provided by the patient and a relative. No language interpreter was used.  Shortness of Breath  Associated symptoms include rhinorrhea and cough. Pertinent negatives include no fever, no chest pain and no abdominal pain.    Past Medical History:  Diagnosis Date  . Arthritis    "hands, arms, all over the place" (03/02/2018)  . Chest pain 02/05/2018  . Chronic lower back pain   . GERD (gastroesophageal reflux disease)   . Glass eye    right eye  . Gout    "on daily RX" (03/02/2018)  . Heart disease   . History of kidney stones   . Hyperlipidemia   . Hypertension   . Kidney cysts   . Kidney disease   . Migraine    "used to get them twice/week; haven't had them in a long time" (03/02/2018)  . NSTEMI (non-ST elevated myocardial infarction) (HCC) 03/01/2018  . On home oxygen therapy    "3L at night and prn during the day" (03/02/2018)  . Pneumonia    used to get it twice/year; stopped after I had hysterectomy" (03/02/2018)  . Sleep apnea   . Type II diabetes mellitus [facility])     Patient Active Problem List   Diagnosis Date Noted  . Essential hypertension 03/03/2018  . NSTEMI (non-ST elevated myocardial infarction) (HCC) 03/01/2018  . CAD (coronary artery disease) 02/08/2018  . Hyperlipidemia 02/08/2018  . Chest pain 02/05/2018  . S/P CABG x 3  02/04/2018    Past Surgical History:  Procedure Laterality Date  . BACK SURGERY    . CARDIAC CATHETERIZATION  12/2017   "before OHS"  . CHOLECYSTECTOMY OPEN  1982  . CORONARY ARTERY BYPASS GRAFT  01/19/2018   "CABG X3; in [place]"  . CORONARY STENT INTERVENTION N/A 03/02/2018   Procedure: CORONARY STENT INTERVENTION;  Surgeon: Kathleene Hazel, MD;  Location: [facility] INVASIVE CV LAB;  Service: Cardiovascular;  Laterality: N/A;  . DILATION AND CURETTAGE OF UTERUS    . 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 KNEE ARTHROSCOPY Right X 4  . LEFT HEART CATH AND CORS/GRAFTS ANGIOGRAPHY N/A 03/02/2018   Procedure: LEFT HEART CATH AND CORS/GRAFTS ANGIOGRAPHY;  Surgeon: Kathleene Hazel, MD;  Location: [facility] INVASIVE CV LAB;  Service: Cardiovascular;  Laterality: N/A;  . LUMBAR DISC SURGERY    . REDUCTION MAMMAPLASTY Bilateral X 2  . TONSILLECTOMY  1953  . VAGINAL HYSTERECTOMY  1970     OB History   None      Home Medications    Prior to Admission medications   Medication Sig Start Date End Date Taking? Authorizing Provider  acetaminophen (TYLENOL) 500 MG tablet Take 500 mg by mouth every 6 (six) hours as needed for headache.   Yes [provider]  allopurinol (ZYLOPRIM) 100 MG  tablet Take 100 mg by mouth daily.   Yes [provider]  amoxicillin-clavulanate (AUGMENTIN) 875-125 MG tablet Take 2 tablets by mouth every 12 (twelve) hours for 10 days. 03/06/18 03/16/18 Yes Cardama, Amadeo GarnetPedro Eduardo, MD  aspirin EC 81 MG tablet Take 81 mg by mouth daily.   Yes [provider]  atorvastatin (LIPITOR) 40 MG tablet Take 40 mg by mouth every evening.  02/16/18  Yes [provider]  clopidogrel (PLAVIX) 75 MG tablet Take 75 mg by mouth daily.   Yes [provider]  ferrous sulfate 325 (65 FE) MG tablet Take 325 mg by mouth daily with breakfast.   Yes [provider]  Insulin Glargine (LANTUS) 100 UNIT/ML Solostar Pen Inject 18 Units  into the skin at bedtime.   Yes [provider]  lisinopril (PRINIVIL,ZESTRIL) 5 MG tablet Take 0.5 tablets (2.5 mg total) by mouth daily. 03/04/18  Yes Duke, Roe RutherfordAngela Nicole, PA  Melatonin 10 MG TABS Take 1 tablet by mouth at bedtime.   Yes [provider]  metoprolol succinate (TOPROL-XL) 25 MG 24 hr tablet Take 1 tablet (25 mg total) by mouth daily. 03/04/18  Yes Duke, Roe RutherfordAngela Nicole, PA  nitroGLYCERIN (NITROSTAT) 0.4 MG SL tablet Place 1 tablet (0.4 mg total) under the tongue every 5 (five) minutes x 3 doses as needed for chest pain. 03/03/18  Yes Duke, Roe RutherfordAngela Nicole, PA  ranitidine (ZANTAC) 150 MG tablet Take 150 mg by mouth daily.   Yes [provider]  vitamin C (ASCORBIC ACID) 500 MG tablet Take 500 mg by mouth 2 (two) times daily.   Yes [provider]  acetaminophen-codeine (TYLENOL #3) 300-30 MG tablet Take 1-2 tablets by mouth every 6 (six) hours as needed for moderate pain. Patient not taking: Reported on 03/01/2018 02/04/18   Sharlene DoryWendling, Nicholas Paul, DO    Family History Family History  Problem Relation Age of Onset  . Heart Problems Mother   . Diabetes Father   . Kidney disease Sister   . Heart disease Brother   . Hypertension Brother   . Kidney disease Brother   . Hypertension Daughter   . Hypertension Daughter   . Hypertension Daughter   . Hypertension Daughter     Social History Social History   Tobacco Use  . Smoking status: Former Smoker    Packs/day: 0.12    Years: 3.00    Pack years: 0.36    Types: Cigarettes    Last attempt to quit: 1963    Years since quitting: 56.3  . Smokeless tobacco: Never Used  Substance Use Topics  . Alcohol use: Not Currently    Frequency: Never  . Drug use: Never     Allergies   Methylprednisolone; Keflex [cephalexin]; Ketorolac; Morphine and related; Novocain [procaine]; and Septra [sulfamethoxazole-trimethoprim]   Review of Systems Review of Systems  Constitutional: Negative for chills  and fever.  HENT: Positive for congestion and rhinorrhea.   Respiratory: Positive for cough and shortness of breath (when coughing).   Cardiovascular: Negative.  Negative for chest pain.  Gastrointestinal: Negative.  Negative for abdominal pain and nausea.  Musculoskeletal: Negative.   Skin: Negative.   Neurological: Negative.      Physical Exam Updated Vital Signs BP 131/64   Pulse (!) 128   Temp 98.6 F (37 C) (Oral)   Resp 17   Ht 5\' 2"  (1.575 m)   Wt 78.9 kg (174 lb)   SpO2 96%   BMI 31.83 kg/m   Physical Exam  Constitutional: She is oriented to person, place, and time. She appears well-developed and well-nourished.  HENT:  Head: Normocephalic.  Neck: Normal range of motion. Neck supple.  Cardiovascular: Normal rate and regular rhythm.  No murmur heard. Pulmonary/Chest: Effort normal and breath sounds normal. She has no wheezes. She has no rhonchi. She has no rales. She exhibits no tenderness.  Abdominal: Soft. Bowel sounds are normal. There is no tenderness. There is no rebound and no guarding.  Musculoskeletal: Normal range of motion.       Right lower leg: She exhibits no edema.       Left lower leg: She exhibits no edema.  Neurological: She is alert and oriented to person, place, and time.  Skin: Skin is warm and dry. No rash noted.  Psychiatric: She has a normal mood and affect.     ED Treatments / Results  Labs (all labs ordered are listed, but only abnormal results are displayed) Labs Reviewed  BASIC METABOLIC PANEL - Abnormal; Notable for the following components:      Result Value   CO2 20 (*)    Glucose, Bld 217 (*)    Creatinine, Ser 1.21 (*)    Calcium 8.0 (*)    GFR calc non Af Amer 42 (*)    GFR calc Af Amer 49 (*)    All other components within normal limits  CBC - Abnormal; Notable for the following components:   RBC 3.74 (*)    Hemoglobin 11.4 (*)    HCT 35.5 (*)    All other components within normal limits  I-STAT TROPONIN, ED -  Abnormal; Notable for the following components:   Troponin i, poc 0.60 (*)    All other components within normal limits  I-STAT TROPONIN, ED - Abnormal; Notable for the following components:   Troponin i, poc 0.52 (*)    All other components within normal limits    EKG EKG Interpretation  Date/Time:  Sunday March 07 2018 20:49:02 EDT Ventricular Rate:  104 PR Interval:    QRS Duration: 94 QT Interval:  352 QTC Calculation: 462 R Axis:   59 Text Interpretation:  Atrial fibrillation Cannot rule out Anterior infarct , age undetermined Abnormal ECG No acute changes Nonspecific ST and T wave abnormality Confirmed by Derwood Kaplan [phone]) on 03/08/2018 1:38:59 AM   Radiology Dg Chest 2 View  Result Date: 03/08/2018 CLINICAL DATA:  Acute onset of cough and difficulty breathing. EXAM: CHEST - 2 VIEW COMPARISON:  Chest radiograph performed 03/06/2018 FINDINGS: The lungs are well-aerated. Mild bibasilar airspace opacities may reflect atelectasis or mild pneumonia, somewhat more prominent than on the prior study. There is no evidence of pleural effusion or pneumothorax. The heart is borderline normal in size. The patient is status post median sternotomy. No acute osseous abnormalities are seen. IMPRESSION: Mild bibasilar airspace opacities may reflect atelectasis or mild pneumonia, somewhat more prominent than on the prior study. Electronically Signed   By: Roanna Raider M.D.   On: 03/08/2018 02:26   Dg Chest 2 View  Result Date: 03/06/2018 CLINICAL DATA:  Cough, congestion shortness of breath for 4 weeks. EXAM: CHEST - 2 VIEW COMPARISON:  Single-view of the chest 03/03/2018. PA and lateral chest 02/09/2018. CT chest 03/01/2018. FINDINGS: Linear opacity in the left lung base has an appearance most suggestive of atelectasis but could represent pneumonia. There is a trace left pleural effusion. Cardiomegaly without edema is seen. The patient is status post CABG. Aortic atherosclerosis noted. No  acute bony  abnormality. IMPRESSION: Linear opacity left lung base has an appearance most suggestive of atelectasis but could represent pneumonia. Trace left pleural effusion. Cardiomegaly without edema. Electronically Signed   By: Drusilla Kanner M.D.   On: 03/06/2018 10:59    Procedures Procedures (including critical care time)  Medications Ordered in ED Medications - No data to display   Initial Impression / Assessment and Plan / ED Course  I have reviewed the triage vital signs and the nursing notes.  Pertinent labs & imaging results that were available during my care of the patient were reviewed by me and considered in my medical decision making (see chart for details).     Patient presents for management of cough that becomes intense and makes her feel she cannot breathe. Cough is sporadic, worse with lying down. No fever. Cough x 1 month. Diagnosed with CAP 2 days ago and on Augmentin but given nothing for cough.  Recent history of NSTEMI resulting in triple bypass one month ago. PCI one week ago for single graft restenosis. No chest pain. Troponin found to be elevated to 0.60. This is significantly improved when compared to recent values. Repeat troponin is 0.52. No EKG changes. No chest pain.   Patient is ambulated without desaturation. She is intermittently tachycardic with history of chronic a-fib.   She is evaluated by Dr. Rhunette Croft. Discussed admission vs discharge home with the patient and family. She prefers discharge home and is felt stable and reasonable for dishcarge. Strict return precautions discussed.  Will provide codeinated cough medication as the patient prefers codeine to hydrocodone.   Final Clinical Impressions(s) / ED Diagnoses   Final diagnoses:  None   1. Cough 2. CAP  ED Discharge Orders    None       Elpidio Anis, PA-C 03/08/18 [id number]    Derwood Kaplan, MD 03/09/18 [phone]

## 2018-03-08 NOTE — Telephone Encounter (Signed)
Pharmacy called related to Rx: Phenyleph-Chlorphen-Codeine 03-18-09 MG/5ML SYRP does not exist ..Marland Kitchen.EDCM clarified with EDP (Lawyer) to change Rx to: promethazine/phenylephrine/codeine.

## 2018-03-09 ENCOUNTER — Encounter: Payer: Self-pay | Admitting: Cardiology

## 2018-03-09 ENCOUNTER — Ambulatory Visit (INDEPENDENT_AMBULATORY_CARE_PROVIDER_SITE_OTHER): Payer: Medicare Other | Admitting: Cardiology

## 2018-03-09 ENCOUNTER — Telehealth: Payer: Self-pay | Admitting: *Deleted

## 2018-03-09 VITALS — BP 122/62 | HR 128 | Ht 62.0 in | Wt 178.0 lb

## 2018-03-09 DIAGNOSIS — R000 Tachycardia, unspecified: Secondary | ICD-10-CM | POA: Insufficient documentation

## 2018-03-09 DIAGNOSIS — R0602 Shortness of breath: Secondary | ICD-10-CM | POA: Diagnosis not present

## 2018-03-09 DIAGNOSIS — I2510 Atherosclerotic heart disease of native coronary artery without angina pectoris: Secondary | ICD-10-CM | POA: Diagnosis not present

## 2018-03-09 DIAGNOSIS — I10 Essential (primary) hypertension: Secondary | ICD-10-CM | POA: Diagnosis not present

## 2018-03-09 DIAGNOSIS — J9801 Acute bronchospasm: Secondary | ICD-10-CM | POA: Diagnosis not present

## 2018-03-09 HISTORY — DX: Tachycardia, unspecified: R00.0

## 2018-03-09 HISTORY — DX: Shortness of breath: R06.02

## 2018-03-09 MED ORDER — DILTIAZEM HCL ER COATED BEADS 240 MG PO CP24: 240.0000 mg | ORAL_CAPSULE | Freq: Every day | ORAL | 11 refills | Status: DC

## 2018-03-09 MED ORDER — ALBUTEROL SULFATE HFA 108 (90 BASE) MCG/ACT IN AERS: 2.0000 | INHALATION_SPRAY | Freq: Four times a day (QID) | RESPIRATORY_TRACT | 2 refills | Status: DC | PRN

## 2018-03-09 NOTE — Telephone Encounter (Signed)
[facility]; forwarded to provider/SLS 04/23

## 2018-03-09 NOTE — Telephone Encounter (Signed)
Received Physician Orders from [facility]; forwarded to provider/SLS 04/23  

## 2018-03-09 NOTE — Patient Instructions (Signed)
Medication Instructions:  Your physician has recommended you make the following change in your medication:  STOP lisinopril STOP metoprolol  START diltiazem (Cardizem) 240 mg daily START albuterol 2 puffs every 6 hours as needed.  Ask the pharmacy if you can buy a spacer.  Labwork: Your physician recommends that you have the following labs drawn: BNP  Testing/Procedures: None  Follow-Up: Your physician recommends that you schedule a follow-up appointment in: 2 weeks.  Any Other Special Instructions Will Be Listed Below (If Applicable).     If you need a refill on your cardiac medications before your next appointment, please call your pharmacy.

## 2018-03-10 ENCOUNTER — Other Ambulatory Visit: Payer: Self-pay

## 2018-03-10 ENCOUNTER — Telehealth: Payer: Self-pay | Admitting: Family Medicine

## 2018-03-10 DIAGNOSIS — I1310 Hypertensive heart and chronic kidney disease without heart failure, with stage 1 through stage 4 chronic kidney disease, or unspecified chronic kidney disease: Secondary | ICD-10-CM | POA: Diagnosis not present

## 2018-03-10 DIAGNOSIS — E1122 Type 2 diabetes mellitus with diabetic chronic kidney disease: Secondary | ICD-10-CM | POA: Diagnosis not present

## 2018-03-10 DIAGNOSIS — I2510 Atherosclerotic heart disease of native coronary artery without angina pectoris: Secondary | ICD-10-CM | POA: Diagnosis not present

## 2018-03-10 DIAGNOSIS — N189 Chronic kidney disease, unspecified: Secondary | ICD-10-CM | POA: Diagnosis not present

## 2018-03-10 DIAGNOSIS — Z48812 Encounter for surgical aftercare following surgery on the circulatory system: Secondary | ICD-10-CM | POA: Diagnosis not present

## 2018-03-10 DIAGNOSIS — Z951 Presence of aortocoronary bypass graft: Secondary | ICD-10-CM | POA: Diagnosis not present

## 2018-03-10 LAB — PRO B NATRIURETIC PEPTIDE: NT-Pro BNP: 4452 pg/mL — ABNORMAL HIGH (ref 0–738)

## 2018-03-10 MED ORDER — FUROSEMIDE 40 MG PO TABS: 40.0000 mg | ORAL_TABLET | Freq: Every day | ORAL | 11 refills | Status: DC

## 2018-03-10 NOTE — Telephone Encounter (Signed)
HHRN informed of verbal ok per PCP 

## 2018-03-10 NOTE — Telephone Encounter (Signed)
Copied from CRM [id number]. Topic: Quick Communication - See Telephone Encounter >> Mar 10, 2018  3:43 PM Landry MellowFoltz, Melissa J wrote: CRM for notification. See Telephone encounter for: 03/10/18. Denise from [facility] called - she is asking for resumption of care orders to be signed.   Verbal ok  Cb is [phone]

## 2018-03-11 DIAGNOSIS — Z48812 Encounter for surgical aftercare following surgery on the circulatory system: Secondary | ICD-10-CM | POA: Diagnosis not present

## 2018-03-11 DIAGNOSIS — I2510 Atherosclerotic heart disease of native coronary artery without angina pectoris: Secondary | ICD-10-CM | POA: Diagnosis not present

## 2018-03-11 DIAGNOSIS — Z951 Presence of aortocoronary bypass graft: Secondary | ICD-10-CM | POA: Diagnosis not present

## 2018-03-11 DIAGNOSIS — I1310 Hypertensive heart and chronic kidney disease without heart failure, with stage 1 through stage 4 chronic kidney disease, or unspecified chronic kidney disease: Secondary | ICD-10-CM | POA: Diagnosis not present

## 2018-03-11 DIAGNOSIS — E1122 Type 2 diabetes mellitus with diabetic chronic kidney disease: Secondary | ICD-10-CM | POA: Diagnosis not present

## 2018-03-11 DIAGNOSIS — N189 Chronic kidney disease, unspecified: Secondary | ICD-10-CM | POA: Diagnosis not present

## 2018-03-12 ENCOUNTER — Ambulatory Visit (INDEPENDENT_AMBULATORY_CARE_PROVIDER_SITE_OTHER): Payer: Medicare Other | Admitting: Family Medicine

## 2018-03-12 ENCOUNTER — Encounter: Payer: Self-pay | Admitting: Family Medicine

## 2018-03-12 VITALS — BP 122/68 | HR 96 | Temp 98.2°F | Ht 62.0 in | Wt 177.5 lb

## 2018-03-12 DIAGNOSIS — I214 Non-ST elevation (NSTEMI) myocardial infarction: Secondary | ICD-10-CM

## 2018-03-12 MED ORDER — POTASSIUM CHLORIDE ER 10 MEQ PO TBCR: EXTENDED_RELEASE_TABLET | ORAL | 5 refills | Status: DC

## 2018-03-12 NOTE — Progress Notes (Signed)
Chief Complaint  Patient presents with  . Hospitalization Follow-up    HPI Margaret Ward is a 77 y.o. y.o. female who presents for a transition of care visit.  Pt was discharged from [facility] on 03/03/18.  Within 48 business hours of discharge our office contacted him via telephone to coordinate her care and needs. Here w daughter.  Admitted to [facility] for NSTEMI. Had a triple bypass that started to fail. Cath proved this. DES placed, placed on dual antiplatelets for next year. She is tolerating well overall, having some easy bruising. Decreased EF on echo, started on Lasix by cardiologist outpt.  She was not placed on supp K, most recent check on 4.21.19 was 3.5 (reportedly was started on Lasix 4.23.19).  She notes that she is not peeing very much on the new medicine.  She was diagnosed with pneumonia on 03/07/2018 and treated with antibiotic's.  She is feeling better overall, however continues to cough.   Past Medical History:  Diagnosis Date  . Arthritis    "hands, arms, all over the place" (03/02/2018)  . Chest pain 02/05/2018  . Chronic lower back pain   . GERD (gastroesophageal reflux disease)   . Glass eye    right eye  . Gout    "on daily RX" (03/02/2018)  . Heart disease   . History of kidney stones   . Hyperlipidemia   . Hypertension   . Kidney cysts   . Kidney disease   . Migraine    "used to get them twice/week; haven't had them in a long time" (03/02/2018)  . NSTEMI (non-ST elevated myocardial infarction) (HCC) 03/01/2018  . On home oxygen therapy    "3L at night and prn during the day" (03/02/2018)  . Pneumonia    used to get it twice/year; stopped after I had hysterectomy" (03/02/2018)  . Sleep apnea   . Type II diabetes mellitus (HCC)    Family History  Problem Relation Age of Onset  . Heart Problems Mother   . Diabetes Father   . Kidney disease Sister   . Heart disease Brother   . Hypertension Brother   . Kidney disease Brother   . Hypertension Daughter   .  Hypertension Daughter   . Hypertension Daughter   . Hypertension Daughter    Allergies as of 03/12/2018      Reactions   Methylprednisolone    Severe cramps legs and arms   Keflex [cephalexin] Hives   Ketorolac Swelling   Morphine And Related    Itching    Novocain [procaine] Swelling   Septra [sulfamethoxazole-trimethoprim] Nausea And Vomiting      Medication List        Accurate as of 03/12/18  1:06 PM. Always use your most recent med list.          acetaminophen 500 MG tablet Commonly known as:  TYLENOL Take 500 mg by mouth every 6 (six) hours as needed for headache.   albuterol 108 (90 Base) MCG/ACT inhaler Commonly known as:  PROVENTIL HFA;VENTOLIN HFA Inhale 2 puffs into the lungs every 6 (six) hours as needed for wheezing or shortness of breath.   allopurinol 100 MG tablet Commonly known as:  ZYLOPRIM Take 100 mg by mouth daily.   amoxicillin-clavulanate 875-125 MG tablet Commonly known as:  AUGMENTIN Take 2 tablets by mouth every 12 (twelve) hours for 10 days.   aspirin EC 81 MG tablet Take 81 mg by mouth daily.   atorvastatin 40 MG tablet Commonly known as:  LIPITOR Take 40 mg by mouth every evening.   clopidogrel 75 MG tablet Commonly known as:  PLAVIX Take 75 mg by mouth daily.   diltiazem 240 MG 24 hr capsule Commonly known as:  CARDIZEM CD Take 1 capsule (240 mg total) by mouth daily.   ferrous sulfate 325 (65 FE) MG tablet Take 325 mg by mouth daily with breakfast.   furosemide 40 MG tablet Commonly known as:  LASIX Take 1 tablet (40 mg total) by mouth daily.   Insulin Glargine 100 UNIT/ML Solostar Pen Commonly known as:  LANTUS Inject 9 Units into the skin at bedtime.   Melatonin 10 MG Tabs Take 1 tablet by mouth at bedtime.   nitroGLYCERIN 0.4 MG SL tablet Commonly known as:  NITROSTAT Place 1 tablet (0.4 mg total) under the tongue every 5 (five) minutes x 3 doses as needed for chest pain.   Phenyleph-Chlorphen-Codeine 03-18-09  MG/5ML Syrp Take 5 mLs by mouth every 6 (six) hours as needed.   potassium chloride 10 MEQ tablet Commonly known as:  KLOR-CON 10 Take 2 tabs with every dose of Lasix.   ranitidine 150 MG tablet Commonly known as:  ZANTAC Take 150 mg by mouth daily.   vitamin C 500 MG tablet Commonly known as:  ASCORBIC ACID Take 500 mg by mouth 2 (two) times daily.       ROS:  Constitutional: No fevers or chills, no weight loss HEENT: No headaches, hearing loss, or runny nose, no sore throat Heart: No chest pain Lungs: No SOB, +cough Abd: No bowel changes, no pain, no N/V GU: No urinary complaints Neuro: No numbness, tingling or weakness Msk: No joint or muscle pain  Objective BP 122/68 (BP Location: Left Arm, Patient Position: Sitting, Cuff Size: Normal)   Pulse 96   Temp 98.2 F (36.8 C) (Oral)   Ht 5\' 2"  (1.575 m)   Wt 177 lb 8 oz (80.5 kg)   SpO2 95%   BMI 32.47 kg/m  General Appearance:  awake, alert, oriented, in no acute distress and well developed, well nourished Skin:  there are no suspicious lesions or rashes of concern Head/face:  NCAT Eyes:  EOMI, PERRLA Ears:  canals and TMs NI Nose/Sinuses:  negative Mouth/Throat:  Mucosa moist, no lesions; pharynx without erythema, edema or exudate. Neck:  neck- supple, no mass, non-tender and no jvd Lungs: Clear to auscultation.  No rales, rhonchi, or wheezing. Normal effort, no accessory muscle use. Heart:  Heart sounds are normal.  Regular rate and rhythm, 2+ pitting edema b/l over distal 1/3 of tibia Abdomen:  BS+, soft, NT, ND, no masses or organomegaly Musculoskeletal:  No muscle group atrophy or asymmetry Neurologic:  Alert and oriented x 3 Psych exam: Nml mood and affect, age appropriate judgment and insight  NSTEMI (non-ST elevated myocardial infarction) (HCC) - Plan: Basic metabolic panel  Discharge summary and medication list have been reviewed/reconciled.  Labs pending at the time of discharge have been reviewed or  are still pending at the time of this visit.  Follow-up labs and appointments have been ordered and/or coordinated appropriately. Educational materials regarding the patient's admitting diagnosis provided.  TRANSITIONAL CARE MANAGEMENT CERTIFICATION:  I certify the following are true:   1. Communication with the patient/care giver was made within 2 business days of discharge.  2. Complexity of Medical decision making is moderate.  3. Face to face visit occurred within 14 days of discharge.   She appears frail and uses a cane. Will cut down  on insulin and have her check daily. Let me know if low sugars.  Add some potassium supplementation while taking Lasix.  I would like to recheck a BMP in 1 week. F/u 6 weeks to recheck DM. The patient and her daughter voiced understanding and agreement to the plan.  Jilda Roche Whitehall, DO 03/12/18 1:06 PM

## 2018-03-12 NOTE — Progress Notes (Signed)
Pre visit review using our clinic review tool, if applicable. No additional management support is needed unless otherwise documented below in the visit note. 

## 2018-03-12 NOTE — Patient Instructions (Addendum)
Take 9 units of insulin nightly until we see each other next. Check sugars in morning and write them down.  You may have a dry cough for 4-6 weeks after the pneumonia resolves. This is normal.   You do not need to fasting for your lab visit.   Let us know if you need anything.

## 2018-03-16 ENCOUNTER — Telehealth: Payer: Self-pay | Admitting: Family Medicine

## 2018-03-16 DIAGNOSIS — I2510 Atherosclerotic heart disease of native coronary artery without angina pectoris: Secondary | ICD-10-CM | POA: Diagnosis not present

## 2018-03-16 DIAGNOSIS — E1122 Type 2 diabetes mellitus with diabetic chronic kidney disease: Secondary | ICD-10-CM | POA: Diagnosis not present

## 2018-03-16 DIAGNOSIS — Z48812 Encounter for surgical aftercare following surgery on the circulatory system: Secondary | ICD-10-CM | POA: Diagnosis not present

## 2018-03-16 DIAGNOSIS — Z951 Presence of aortocoronary bypass graft: Secondary | ICD-10-CM | POA: Diagnosis not present

## 2018-03-16 DIAGNOSIS — I1310 Hypertensive heart and chronic kidney disease without heart failure, with stage 1 through stage 4 chronic kidney disease, or unspecified chronic kidney disease: Secondary | ICD-10-CM | POA: Diagnosis not present

## 2018-03-16 DIAGNOSIS — N189 Chronic kidney disease, unspecified: Secondary | ICD-10-CM | POA: Diagnosis not present

## 2018-03-16 NOTE — Telephone Encounter (Signed)
Noted. Insulin/Lantus is 9 units nightly. TY.

## 2018-03-16 NOTE — Telephone Encounter (Signed)
Copied from CRM [id number]. Topic: General - Other >> Mar 16, 2018  4:01 PM Maia Petties wrote: Reason for CRM: Pt has had multiple visits this week and was only seen once this week. So 1 week 1 and 1 visit needs added to the end of the episode. Please advise

## 2018-03-16 NOTE — Telephone Encounter (Signed)
Patient has to be seen twice this week, but has only been seen once. So ok to add that other visit to another week.

## 2018-03-16 NOTE — Telephone Encounter (Signed)
Spoke to [facility] to confirm information/medications. She did want PCP to know she has no edema/BS today was 131/states she does look so much better. Although thinks a little dehydrated from the antibiotic (had made her nausea), but is finishing it today. RN informed her to drinks plenty of water.  Otherwise everything else ok.

## 2018-03-16 NOTE — Telephone Encounter (Signed)
Copied from CRM [id number]. Topic: Quick Communication - See Telephone Encounter >> Mar 16, 2018  1:01 PM Clack, Princella Pellegrini wrote: CRM for notification. See Telephone encounter for: 03/16/18.  Angelique Blonder with [facility] wanted to let Let Dr. Marlene Bast know that the pt is down to 171.2 lbs, her lungs sounds better, heart rate is at 110 and her cough is much better now. She also needs to verify the doseage on the pt Insulin Glargine (LANTUS) 100 UNIT/ML Solostar Pen [[id number]] and verify what medication she is taking for her potassium.  Contact # [phone]

## 2018-03-17 ENCOUNTER — Telehealth: Payer: Self-pay | Admitting: Family Medicine

## 2018-03-17 ENCOUNTER — Telehealth: Payer: Self-pay | Admitting: *Deleted

## 2018-03-17 NOTE — Telephone Encounter (Signed)
Copied from CRM [id number]. Topic: Quick Communication - See Telephone Encounter >> Mar 17, 2018  9:15 AM Louie Bun, Rosey Bath D wrote: CRM for notification. See Telephone encounter for: 03/17/18. Andres Ege with [facility] called and would like provider to know that patient decline a Child psychotherapist. If any questions he can be reached at [phone].

## 2018-03-17 NOTE — Telephone Encounter (Signed)
Received Home Health Re-Certification and Resumption Plan of Care from [facility]; forwarded to provider/SLS 05/01  Received Physician Orders [x2] from [facility]; forwarded to provider/SLS 05/01

## 2018-03-17 NOTE — Telephone Encounter (Signed)
Received request for VO for HHPT from Sherlynn Carbon at [facility] for [place] Artist ex; there act], Gait, Balance training, Education in HEP, Safety, condition management/SLS 05/01

## 2018-03-17 NOTE — Telephone Encounter (Signed)
Received Client Missed Visit Report from [facility]; forwarded to Provider for review/SLS 05/01

## 2018-03-17 NOTE — Telephone Encounter (Signed)
Noted  

## 2018-03-18 ENCOUNTER — Telehealth: Payer: Self-pay | Admitting: Cardiology

## 2018-03-18 DIAGNOSIS — Z48812 Encounter for surgical aftercare following surgery on the circulatory system: Secondary | ICD-10-CM | POA: Diagnosis not present

## 2018-03-18 DIAGNOSIS — I1310 Hypertensive heart and chronic kidney disease without heart failure, with stage 1 through stage 4 chronic kidney disease, or unspecified chronic kidney disease: Secondary | ICD-10-CM | POA: Diagnosis not present

## 2018-03-18 DIAGNOSIS — I2510 Atherosclerotic heart disease of native coronary artery without angina pectoris: Secondary | ICD-10-CM | POA: Diagnosis not present

## 2018-03-18 DIAGNOSIS — Z951 Presence of aortocoronary bypass graft: Secondary | ICD-10-CM | POA: Diagnosis not present

## 2018-03-18 DIAGNOSIS — N189 Chronic kidney disease, unspecified: Secondary | ICD-10-CM | POA: Diagnosis not present

## 2018-03-18 DIAGNOSIS — E1122 Type 2 diabetes mellitus with diabetic chronic kidney disease: Secondary | ICD-10-CM | POA: Diagnosis not present

## 2018-03-18 MED ORDER — ACEBUTOLOL HCL 200 MG PO CAPS: 200.0000 mg | ORAL_CAPSULE | Freq: Every day | ORAL | 11 refills | Status: DC

## 2018-03-18 NOTE — Telephone Encounter (Signed)
Margaret Ward advised that the last 2 visits with the Margaret Ward the heart rate was 112 and 115. The last time [facility] left the O2 sat monitor on the Margaret Ward's finger for about 10 minutes and the heart rate stayed around 115. Margaret Ward is asymptomatic at this time. Margaret Ward's heart rate was in the 70's when first arrived. Margaret Ward wanted to know if Dr. Dulce Sellar wanted to increase the dose of diltiazem.   Please advise.

## 2018-03-18 NOTE — Addendum Note (Signed)
Addended by: Ayesha Mohair E on: 03/18/2018 03:53 PM   Modules accepted: Orders

## 2018-03-18 NOTE — Telephone Encounter (Signed)
I looked at EKG sinus tachycardia last visit Start a low dose selectibe BB acebutolol 200 mg a day

## 2018-03-18 NOTE — Telephone Encounter (Signed)
Left message to return call 

## 2018-03-18 NOTE — Telephone Encounter (Signed)
Margaret Ward that Dr. Dulce Sellar is starting patient on acebutolol 200 mg daily. Margaret Ward would contact the patient to inform her. Margaret Ward verbalized understanding. No further questions.

## 2018-03-18 NOTE — Telephone Encounter (Signed)
Advised patient that Dr. Dulce Sellar wants her to start acebutolol 200 mg daily. Patient verbalized understanding. Sending prescription to [facility] in [place] per request. No further questions.

## 2018-03-18 NOTE — Telephone Encounter (Signed)
Meda Klinefelter is [employer] fundraiser in patient's living area. Please call about elevated heart rate and possible med changes

## 2018-03-19 ENCOUNTER — Other Ambulatory Visit (INDEPENDENT_AMBULATORY_CARE_PROVIDER_SITE_OTHER): Payer: Medicare Other

## 2018-03-19 DIAGNOSIS — I214 Non-ST elevation (NSTEMI) myocardial infarction: Secondary | ICD-10-CM | POA: Diagnosis not present

## 2018-03-19 LAB — BASIC METABOLIC PANEL
BUN: 16 mg/dL (ref 6–23)
CO2: 28 meq/L (ref 19–32)
CREATININE: 1.22 mg/dL — AB (ref 0.40–1.20)
Calcium: 8.8 mg/dL (ref 8.4–10.5)
Chloride: 105 mEq/L (ref 96–112)
GFR: 45.38 mL/min — ABNORMAL LOW (ref >60.00)
GLUCOSE: 147 mg/dL — AB (ref 70–99)
Potassium: 4.5 mEq/L (ref 3.5–5.1)
Sodium: 141 mEq/L (ref 135–145)

## 2018-03-22 DIAGNOSIS — Z48812 Encounter for surgical aftercare following surgery on the circulatory system: Secondary | ICD-10-CM | POA: Diagnosis not present

## 2018-03-22 DIAGNOSIS — Z951 Presence of aortocoronary bypass graft: Secondary | ICD-10-CM | POA: Diagnosis not present

## 2018-03-22 DIAGNOSIS — I1310 Hypertensive heart and chronic kidney disease without heart failure, with stage 1 through stage 4 chronic kidney disease, or unspecified chronic kidney disease: Secondary | ICD-10-CM | POA: Diagnosis not present

## 2018-03-22 DIAGNOSIS — N189 Chronic kidney disease, unspecified: Secondary | ICD-10-CM | POA: Diagnosis not present

## 2018-03-22 DIAGNOSIS — I2510 Atherosclerotic heart disease of native coronary artery without angina pectoris: Secondary | ICD-10-CM | POA: Diagnosis not present

## 2018-03-22 DIAGNOSIS — E1122 Type 2 diabetes mellitus with diabetic chronic kidney disease: Secondary | ICD-10-CM | POA: Diagnosis not present

## 2018-03-23 ENCOUNTER — Telehealth: Payer: Self-pay | Admitting: Cardiology

## 2018-03-23 DIAGNOSIS — N189 Chronic kidney disease, unspecified: Secondary | ICD-10-CM | POA: Diagnosis not present

## 2018-03-23 DIAGNOSIS — I1310 Hypertensive heart and chronic kidney disease without heart failure, with stage 1 through stage 4 chronic kidney disease, or unspecified chronic kidney disease: Secondary | ICD-10-CM | POA: Diagnosis not present

## 2018-03-23 DIAGNOSIS — E1122 Type 2 diabetes mellitus with diabetic chronic kidney disease: Secondary | ICD-10-CM | POA: Diagnosis not present

## 2018-03-23 DIAGNOSIS — I2510 Atherosclerotic heart disease of native coronary artery without angina pectoris: Secondary | ICD-10-CM | POA: Diagnosis not present

## 2018-03-23 DIAGNOSIS — Z48812 Encounter for surgical aftercare following surgery on the circulatory system: Secondary | ICD-10-CM | POA: Diagnosis not present

## 2018-03-23 DIAGNOSIS — Z951 Presence of aortocoronary bypass graft: Secondary | ICD-10-CM | POA: Diagnosis not present

## 2018-03-23 NOTE — Telephone Encounter (Signed)
Spoke with patient's daughter, Santina Evans, who states patient felt her heart start pounding all of a sudden and was having some shortness of breath while watching TV. Santina Evans was at work during this time, so she could not give me any more information. Called patient who states she is feeling much better. She reports taking one nitroglycerin. Her physical therapist came to work with her right after this episode and she took her vital signs which patient states were stable. Patient was able to work with physical therapy. Denies chest pain and shortness of breath at this time. Encouraged her to contact our office if she had any questions or had another similar episode. Patient verbalized understanding. No further questions.

## 2018-03-23 NOTE — Telephone Encounter (Signed)
Is still having some problems with her heart rate and wants to know about switching up meds

## 2018-03-24 ENCOUNTER — Telehealth: Payer: Self-pay | Admitting: *Deleted

## 2018-03-24 NOTE — Telephone Encounter (Signed)
Received Physician Orders from [facility]; forwarded to provider/SLS 05/08

## 2018-03-25 ENCOUNTER — Telehealth: Payer: Self-pay | Admitting: Cardiology

## 2018-03-25 DIAGNOSIS — Z951 Presence of aortocoronary bypass graft: Secondary | ICD-10-CM | POA: Diagnosis not present

## 2018-03-25 DIAGNOSIS — I1310 Hypertensive heart and chronic kidney disease without heart failure, with stage 1 through stage 4 chronic kidney disease, or unspecified chronic kidney disease: Secondary | ICD-10-CM | POA: Diagnosis not present

## 2018-03-25 DIAGNOSIS — Z48812 Encounter for surgical aftercare following surgery on the circulatory system: Secondary | ICD-10-CM | POA: Diagnosis not present

## 2018-03-25 DIAGNOSIS — N189 Chronic kidney disease, unspecified: Secondary | ICD-10-CM | POA: Diagnosis not present

## 2018-03-25 DIAGNOSIS — I2510 Atherosclerotic heart disease of native coronary artery without angina pectoris: Secondary | ICD-10-CM | POA: Diagnosis not present

## 2018-03-25 DIAGNOSIS — E1122 Type 2 diabetes mellitus with diabetic chronic kidney disease: Secondary | ICD-10-CM | POA: Diagnosis not present

## 2018-03-25 NOTE — Telephone Encounter (Signed)
[facility] health called and they state that patient takes her acebuterol that it causes her to have chest tightness when she breathes, Patient has appt for Monday with [facility] and they want to know if she needs to stop meds now or wait til Monday, please call patient or Cenise at [facility] at [phone]

## 2018-03-25 NOTE — Telephone Encounter (Signed)
Reviewed with Dr. Bing Matter who originally advised for the patient to go to the emergency department. Margaret Ward of this and she states that the patient does not describe it as the normal chest tightness or pressure. The symptoms start about an hour after she takes the medication and lasts for about an hour or two. Margaret Ward states that the patient describes almost an asthma type symptom where her chest feels tight only when she breathes.   Reviewed the new information with Dr. Bing Matter who advised that patient can stop acebutolol now, but that if symptoms continue patient should go to the nearest emergency department. Margaret Ward verbalized understanding and will contact the patient and give new instructions. No further questions.

## 2018-03-28 DIAGNOSIS — I5032 Chronic diastolic (congestive) heart failure: Secondary | ICD-10-CM | POA: Insufficient documentation

## 2018-03-28 DIAGNOSIS — J9801 Acute bronchospasm: Secondary | ICD-10-CM | POA: Insufficient documentation

## 2018-03-28 HISTORY — DX: Acute bronchospasm: J98.01

## 2018-03-28 HISTORY — DX: Chronic diastolic (congestive) heart failure: I50.32

## 2018-03-28 NOTE — Progress Notes (Signed)
Cardiology Office Note:    Date:  03/29/2018   ID:  Margaret Ward, DOB 07-26-1941, MRN [id number]  PCP:  Margaret Dory, DO  Cardiologist:  Norman Herrlich, MD    Referring MD: Margaret Ward*    ASSESSMENT:    1. Chronic diastolic heart failure (HCC)   2. Coronary artery disease of native artery of native heart with stable angina pectoris (HCC)   3. Hyperlipidemia, unspecified hyperlipidemia type   4. Bronchospasm   5. Hypertensive heart disease with heart failure (HCC)   6. Sinus tachycardia    PLAN:    In order of problems listed above:  1. Improved but still has edema fluid overload and having orthopnea and she will increase her loop diuretic.  I have asked her in 1 week to have follow-up labs including proBNP and renal function. 2. Stable continue medical therapy including dual antiplatelet agent with recent PCI and stent to native coronary vessel 3. Stable continue statin 4. Improved I asked her to use her bronchodilator twice daily 5. Stable continue current treatment 6. Improved continue rate limiting calcium channel blocker   Next appointment: 4 weeks   Medication Adjustments/Labs and Tests Ordered: Current medicines are reviewed at length with the patient today.  Concerns regarding medicines are outlined above.  Orders Placed This Encounter  Procedures  . Basic metabolic panel  . Pro b natriuretic peptide (BNP)   Meds ordered this encounter  Medications  . furosemide (LASIX) 40 MG tablet    Sig: Take 1 tablet (40 mg total) by mouth daily. Take 1 additional tablet on Monday, Wednesday, Friday    Dispense:  45 tablet    Refill:  11    Chief Complaint  Patient presents with  . Follow-up    new heart failure and bronchospasm after PCI and stent  . Congestive Heart Failure  . Coronary Artery Disease  . Asthma  . Hypertension    History of Present Illness:    Margaret Ward is a 77 y.o. female with a hx of CAD, CABG and recent  NonSTEMI 03/02/18 with PCI and DES of D1  with  last seen 03/09/18. 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  She is improved much less shortness of breath bronchospasm edema but still has orthopnea and ongoing 2+ peripheral edema.  Because of bronchospasm she stopped her selective beta-blocker.  She has had no angina palpitation or syncope Past Medical History:  Diagnosis Date  . Arthritis    "hands, arms, all over the place" (03/02/2018)  . Chest pain 02/05/2018  . Chronic lower back pain   . GERD (gastroesophageal reflux disease)   . Glass eye    right eye  . Gout    "on daily RX" (03/02/2018)  . Heart disease   . History of kidney stones   . Hyperlipidemia   . Hypertension   . Kidney cysts   . Kidney disease   . Migraine    "used to get them twice/week; haven't had them in a long time" (03/02/2018)  . NSTEMI (non-ST elevated myocardial infarction) (HCC) 03/01/2018  . On home oxygen therapy    "3L at night and prn during the day" (03/02/2018)  . Pneumonia    used to get it twice/year; stopped  after I had hysterectomy" (03/02/2018)  . Sleep apnea   . Type II diabetes mellitus (HCC)     Past Surgical History:  Procedure Laterality Date  . BACK SURGERY    . CARDIAC CATHETERIZATION  12/2017   "before [facility]"  . CHOLECYSTECTOMY OPEN  1982  . CORONARY ARTERY BYPASS GRAFT  01/19/2018   "CABG X3; in [place]"  . CORONARY STENT INTERVENTION N/A 03/02/2018   Procedure: CORONARY STENT INTERVENTION;  Surgeon: Kathleene Hazel, MD;  Location: [facility] INVASIVE CV [facility];  Service: Cardiovascular;  Laterality: N/A;  . DILATION AND CURETTAGE OF UTERUS    . 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 KNEE ARTHROSCOPY Right X 4  . LEFT HEART CATH AND  CORS/GRAFTS ANGIOGRAPHY N/A 03/02/2018   Procedure: LEFT HEART CATH AND CORS/GRAFTS ANGIOGRAPHY;  Surgeon: Kathleene Hazel, MD;  Location: [facility] INVASIVE CV [facility];  Service: Cardiovascular;  Laterality: N/A;  . LUMBAR DISC SURGERY    . REDUCTION MAMMAPLASTY Bilateral X 2  . TONSILLECTOMY  1953  . VAGINAL HYSTERECTOMY  1970    Current Medications: Current Meds  Medication Sig  . acetaminophen (TYLENOL) 500 MG tablet Take 500 mg by mouth every 6 (six) hours as needed for headache.  . albuterol (PROVENTIL HFA;VENTOLIN HFA) 108 (90 Base) MCG/ACT inhaler Inhale 2 puffs into the lungs every 6 (six) hours as needed for wheezing or shortness of breath.  . allopurinol (ZYLOPRIM) 100 MG tablet Take 100 mg by mouth daily.  Marland Kitchen aspirin EC 81 MG tablet Take 81 mg by mouth daily.  Marland Kitchen atorvastatin (LIPITOR) 40 MG tablet Take 40 mg by mouth every evening.   . clopidogrel (PLAVIX) 75 MG tablet Take 75 mg by mouth daily.  Marland Kitchen diltiazem (CARDIZEM CD) 240 MG 24 hr capsule Take 1 capsule (240 mg total) by mouth daily.  . ferrous sulfate 325 (65 FE) MG tablet Take 325 mg by mouth daily with breakfast.  . furosemide (LASIX) 40 MG tablet Take 1 tablet (40 mg total) by mouth daily. Take 1 additional tablet on Monday, Wednesday, Friday  . Insulin Glargine (LANTUS) 100 UNIT/ML Solostar Pen Inject 9 Units into the skin at bedtime.  . Melatonin 10 MG TABS Take 1 tablet by mouth at bedtime.  . nitroGLYCERIN (NITROSTAT) 0.4 MG SL tablet Place 1 tablet (0.4 mg total) under the tongue every 5 (five) minutes x 3 doses as needed for chest pain.  Marland Kitchen Phenyleph-Chlorphen-Codeine 03-18-09 MG/5ML SYRP Take 5 mLs by mouth every 6 (six) hours as needed.  . potassium chloride (KLOR-CON 10) 10 MEQ tablet Take 2 tabs with every dose of Lasix.  Marland Kitchen ranitidine (ZANTAC) 150 MG tablet Take 150 mg by mouth daily.  . vitamin C (ASCORBIC ACID) 500 MG tablet Take 500 mg by mouth 2 (two) times daily.  . [DISCONTINUED] furosemide (LASIX) 40 MG tablet  Take 1 tablet (40 mg total) by mouth daily.     Allergies:   Methylprednisolone; Keflex [cephalexin]; Ketorolac; Morphine and related; Novocain [procaine]; and Septra [sulfamethoxazole-trimethoprim]   Social History   Socioeconomic History  . Marital status: Married    Spouse name: Not on file  . Number of children: Not on file  . Years of education: Not on file  . Highest education level: Not on file  Occupational History  . Not on file  Social Needs  . Financial resource strain: Not on file  . Food insecurity:    Worry: Not on file    Inability:  Not on file  . Transportation needs:    Medical: Not on file    Non-medical: Not on file  Tobacco Use  . Smoking status: Former Smoker    Packs/day: 0.12    Years: 3.00    Pack years: 0.36    Types: Cigarettes    Last attempt to quit: 1963    Years since quitting: 56.4  . Smokeless tobacco: Never Used  Substance and Sexual Activity  . Alcohol use: Not Currently    Frequency: Never  . Drug use: Never  . Sexual activity: Not on file  Lifestyle  . Physical activity:    Days per week: Not on file    Minutes per session: Not on file  . Stress: Not on file  Relationships  . Social connections:    Talks on phone: Not on file    Gets together: Not on file    Attends religious service: Not on file    Active member of club or organization: Not on file    Attends meetings of clubs or organizations: Not on file    Relationship status: Not on file  Other Topics Concern  . Not on file  Social History Narrative  . Not on file     Family History: The patient's family history includes Diabetes in her father; Heart Problems in her mother; Heart disease in her brother; Hypertension in her brother, daughter, daughter, daughter, and daughter; Kidney disease in her brother and sister. ROS:   Please see the history of present illness.    All other systems reviewed and are negative.  EKGs/Labs/Other Studies Reviewed:    The following  studies were reviewed today:   Recent Labs: 02/09/2018: ALT 13 03/07/2018: Hemoglobin 11.4; Platelets 217 03/09/2018: NT-Pro BNP 4,452 03/19/2018: BUN 16; Creatinine, Ser 1.22; Potassium 4.5; Sodium 141  Recent Lipid Panel    Component Value Date/Time   CHOL 84 (L) 02/09/2018 1016   TRIG 90 02/09/2018 1016   HDL 39 (L) 02/09/2018 1016   CHOLHDL 2.2 02/09/2018 1016   LDLCALC 27 02/09/2018 1016    Physical Exam:    VS:  BP 122/64 (BP Location: Right Arm, Patient Position: Sitting, Cuff Size: Normal)   Pulse 83   Ht  (1.575 m)   Wt 175 lb 12.8 oz (79.7 kg)   SpO2 98%   BMI 32.15 kg/m     Wt Readings from Last 3 Encounters:  03/29/18 175 lb 12.8 oz (79.7 kg)  03/29/18 175 lb 12.8 oz (79.7 kg)  03/12/18 177 lb 8 oz (80.5 kg)     GEN:  Well nourished, well developed in no acute distress HEENT: Normal NECK: No JVD; No carotid bruits LYMPHATICS: No lymphadenopathy CARDIAC: RRR, no murmurs, rubs, gallops RESPIRATORY:  Diffusely decreased BS without rales, wheezing or rhonchi  ABDOMEN: Soft, non-tender, non-distended MUSCULOSKELETAL:  2-3+ edema; No deformity  SKIN: Warm and dry NEUROLOGIC:  Alert and oriented x 3 PSYCHIATRIC:  Normal affect    Signed, Norman Herrlich, MD  03/29/2018 12:38 PM    [facility]

## 2018-03-29 ENCOUNTER — Encounter: Payer: Self-pay | Admitting: Cardiothoracic Surgery

## 2018-03-29 ENCOUNTER — Ambulatory Visit (INDEPENDENT_AMBULATORY_CARE_PROVIDER_SITE_OTHER): Payer: Medicare Other | Admitting: Cardiothoracic Surgery

## 2018-03-29 ENCOUNTER — Encounter: Payer: Self-pay | Admitting: Cardiology

## 2018-03-29 ENCOUNTER — Other Ambulatory Visit: Payer: Self-pay

## 2018-03-29 ENCOUNTER — Ambulatory Visit (INDEPENDENT_AMBULATORY_CARE_PROVIDER_SITE_OTHER): Payer: Medicare Other | Admitting: Cardiology

## 2018-03-29 VITALS — BP 108/60 | HR 100 | Resp 16 | Ht 62.0 in | Wt 175.8 lb

## 2018-03-29 VITALS — BP 122/64 | HR 83 | Ht 62.0 in | Wt 175.8 lb

## 2018-03-29 DIAGNOSIS — I5032 Chronic diastolic (congestive) heart failure: Secondary | ICD-10-CM

## 2018-03-29 DIAGNOSIS — I214 Non-ST elevation (NSTEMI) myocardial infarction: Secondary | ICD-10-CM

## 2018-03-29 DIAGNOSIS — E785 Hyperlipidemia, unspecified: Secondary | ICD-10-CM | POA: Diagnosis not present

## 2018-03-29 DIAGNOSIS — I110 Hypertensive heart disease with heart failure: Secondary | ICD-10-CM | POA: Diagnosis not present

## 2018-03-29 DIAGNOSIS — J9801 Acute bronchospasm: Secondary | ICD-10-CM | POA: Diagnosis not present

## 2018-03-29 DIAGNOSIS — R000 Tachycardia, unspecified: Secondary | ICD-10-CM | POA: Diagnosis not present

## 2018-03-29 DIAGNOSIS — I2510 Atherosclerotic heart disease of native coronary artery without angina pectoris: Secondary | ICD-10-CM | POA: Diagnosis not present

## 2018-03-29 DIAGNOSIS — I25118 Atherosclerotic heart disease of native coronary artery with other forms of angina pectoris: Secondary | ICD-10-CM | POA: Diagnosis not present

## 2018-03-29 DIAGNOSIS — Z951 Presence of aortocoronary bypass graft: Secondary | ICD-10-CM

## 2018-03-29 MED ORDER — FUROSEMIDE 40 MG PO TABS: 40.0000 mg | ORAL_TABLET | Freq: Every day | ORAL | 11 refills | Status: DC

## 2018-03-29 NOTE — Progress Notes (Signed)
[address]       [place] [address]             [phone]                    Candee Hoon [facility] Medical Record #[id number] Date of Birth: 09-28-1941  Referring: Baldo Daub, MD Primary Care: Sharlene Dory, DO Primary Cardiologist: Norman Herrlich, MD  Chief Complaint:    Chief Complaint  Patient presents with  . Referral    new post op followup exam s/p CABG 01/13/18 performed at [facility]...ECHO here on 03/02/18    History of Present Illness:    Margaret Ward 77 y.o. female is seen in the office  today for as a new patient following coronary artery bypass grafting in early March.  At the time the patient was living in [place].  She began having severe anginal symptoms primarily manifest by shortness of breath and left arm pain she was transferred to [facility] to [facility] underwent cardiac catheterization, and ultimately coronary artery bypass grafting x3, off-pump with left internal mammary to the left anterior descending coronary artery reverse saphenous vein graft to the ramus and reverse saphenous vein graft to the posterior descending.  Postoperative the patient was in the hospital 5 to 6 days and was discharged home.  She then came to [facility] for recuperation because of her family lives here.  On April 16th about 5 weeks postop she began having similar anginal symptoms was admitted with a non-STEMI myocardial infarction, was taken to the Cath Lab and a angioplasty and drug-eluting stent was placed in the intermediate vessel.  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   Patient comes to the surgical office today at the request of cardiology, she seems to be making reasonable progress since discharge, increasing her physical activity appropriately.  She has had no further chest pain since the admission on April  16.   Current Activity/ Functional Status:  Patient is independent with mobility/ambulation, transfers, ADL's, IADL's.   Zubrod Score: At the time of surgery this patient's most appropriate activity status/level should be described as: []     0    Normal activity, no symptoms [x]     1    Restricted in physical strenuous activity but ambulatory, able to do out light work []     2    Ambulatory and capable of self care, unable to do work activities, up and about               >50 % of waking hours                              []     3    Only limited self care, in bed greater than 50% of waking hours []     4    Completely disabled, no self care, confined to bed or chair []     5    Moribund   Past Medical History:  Diagnosis Date  . Arthritis    "hands, arms, all over the place" (03/02/2018)  . Chest pain 02/05/2018  . Chronic lower back pain   . GERD (gastroesophageal reflux disease)   . Glass eye    right eye  . Gout    "on daily RX" (  03/02/2018)  . Heart disease   . History of kidney stones   . Hyperlipidemia   . Hypertension   . Kidney cysts   . Kidney disease   . Migraine    "used to get them twice/week; haven't had them in a long time" (03/02/2018)  . NSTEMI (non-ST elevated myocardial infarction) (HCC) 03/01/2018  . On home oxygen therapy    "3L at night and prn during the day" (03/02/2018)  . Pneumonia    used to get it twice/year; stopped after I had hysterectomy" (03/02/2018)  . Sleep apnea   . Type II diabetes mellitus (HCC)     Past Surgical History:  Procedure Laterality Date  . BACK SURGERY    . CARDIAC CATHETERIZATION  12/2017   "before [facility]"  . CHOLECYSTECTOMY OPEN  1982  . CORONARY ARTERY BYPASS GRAFT  01/19/2018   "CABG X3; in [place]"  . CORONARY STENT INTERVENTION N/A 03/02/2018   Procedure: CORONARY STENT INTERVENTION;  Surgeon: Kathleene Hazel, MD;  Location: [facility] INVASIVE CV LAB;  Service: Cardiovascular;  Laterality: N/A;  . DILATION AND  CURETTAGE OF UTERUS    . 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 KNEE ARTHROSCOPY Right X 4  . LEFT HEART CATH AND CORS/GRAFTS ANGIOGRAPHY N/A 03/02/2018   Procedure: LEFT HEART CATH AND CORS/GRAFTS ANGIOGRAPHY;  Surgeon: Kathleene Hazel, MD;  Location: [facility] INVASIVE CV LAB;  Service: Cardiovascular;  Laterality: N/A;  . LUMBAR DISC SURGERY    . REDUCTION MAMMAPLASTY Bilateral X 2  . TONSILLECTOMY  1953  . VAGINAL HYSTERECTOMY  1970    Family History  Problem Relation Age of Onset  . Heart Problems Mother   . Diabetes Father   . Kidney disease Sister   . Heart disease Brother   . Hypertension Brother   . Kidney disease Brother   . Hypertension Daughter   . Hypertension Daughter   . Hypertension Daughter   . Hypertension Daughter     Social History   Socioeconomic History  . Marital status: Married    Spouse name: Not on file  . Number of children: Not on file  . Years of education: Not on file  . Highest education level: Not on file  Occupational History  . Not on file  Social Needs  . Financial resource strain: Not on file  . Food insecurity:    Worry: Not on file    Inability: Not on file  . Transportation needs:    Medical: Not on file    Non-medical: Not on file  Tobacco Use  . Smoking status: Former Smoker    Packs/day: 0.12    Years: 3.00    Pack years: 0.36    Types: Cigarettes    Last attempt to quit: 1963    Years since quitting: 56.4  . Smokeless tobacco: Never Used  Substance and Sexual Activity  . Alcohol use: Not Currently    Frequency: Never  . Drug use: Never  . Sexual activity: Not on file  Lifestyle  . Physical activity:    Days per week: Not on file    Minutes per session: Not on file  . Stress: Not on file  Relationships  . Social connections:    Talks on phone: Not on file    Gets together: Not on file    Attends religious service: Not on file    Active member of club or organization: Not on file    Attends meetings  of clubs or organizations: Not on file    Relationship status: Not on file  . Intimate partner violence:    Fear of current or ex partner: Not on file    Emotionally abused: Not on file    Physically abused: Not on file    Forced sexual activity: Not on file  Other Topics Concern  . Not on file  Social History Narrative  . Not on file    Social History   Tobacco Use  Smoking Status Former Smoker  . Packs/day: 0.12  . Years: 3.00  . Pack years: 0.36  . Types: Cigarettes  . Last attempt to quit: 1963  . Years since quitting: 84.4  Smokeless Tobacco Never Used    Social History   Substance and Sexual Activity  Alcohol Use Not Currently  . Frequency: Never     Allergies  Allergen Reactions  . Methylprednisolone     Severe cramps legs and arms  . Keflex [Cephalexin] Hives  . Ketorolac Swelling  . Morphine And Related     Itching   . Novocain [Procaine] Swelling  . Septra [Sulfamethoxazole-Trimethoprim] Nausea And Vomiting    Current Outpatient Medications  Medication Sig Dispense Refill  . acetaminophen (TYLENOL) 500 MG tablet Take 500 mg by mouth every 6 (six) hours as needed for headache.    . albuterol (PROVENTIL HFA;VENTOLIN HFA) 108 (90 Base) MCG/ACT inhaler Inhale 2 puffs into the lungs every 6 (six) hours as needed for wheezing or shortness of breath. 1 Inhaler 2  . allopurinol (ZYLOPRIM) 100 MG tablet Take 100 mg by mouth daily.    Marland Kitchen aspirin EC 81 MG tablet Take 81 mg by mouth daily.    Marland Kitchen atorvastatin (LIPITOR) 40 MG tablet Take 40 mg by mouth every evening.   3  . clopidogrel (PLAVIX) 75 MG tablet Take 75 mg by mouth daily.    Marland Kitchen diltiazem (CARDIZEM CD) 240 MG 24 hr capsule Take 1 capsule (240 mg total) by mouth daily. 30 capsule 11  . ferrous sulfate 325 (65 FE) MG tablet Take 325 mg by mouth daily with breakfast.    . furosemide (LASIX) 40 MG tablet Take 1 tablet (40 mg total) by mouth daily. Take 1 additional tablet on Monday, Wednesday, Friday 45 tablet  11  . Insulin Glargine (LANTUS) 100 UNIT/ML Solostar Pen Inject 9 Units into the skin at bedtime. 15 mL   . Melatonin 10 MG TABS Take 1 tablet by mouth at bedtime.    . nitroGLYCERIN (NITROSTAT) 0.4 MG SL tablet Place 1 tablet (0.4 mg total) under the tongue every 5 (five) minutes x 3 doses as needed for chest pain. 25 tablet 1  . Phenyleph-Chlorphen-Codeine 03-18-09 MG/5ML SYRP Take 5 mLs by mouth every 6 (six) hours as needed. 100 mL 0  . potassium chloride (KLOR-CON 10) 10 MEQ tablet Take 2 tabs with every dose of Lasix. 60 tablet 5  . ranitidine (ZANTAC) 150 MG tablet Take 150 mg by mouth daily.    . vitamin C (ASCORBIC ACID) 500 MG tablet Take 500 mg by mouth 2 (two) times daily.    Marland Kitchen acebutolol (SECTRAL) 200 MG capsule Take 1 capsule (200 mg total) by mouth daily. (Patient not taking: Reported on 03/29/2018) 30 capsule 11   No current facility-administered medications for this visit.       Review of Systems:     Cardiac Review of Systems: [Y] = yes  or   [ N ] = no   Chest  Pain [  n  ]  Resting SOB [ n  ] Exertional SOB  Cove.Etienne  ]  Orthopnea [ y ]   Pedal Edema Cove.Etienne   ]    Palpitations [ n ] Syncope  [ n ]   Presyncope [n   ]   General Review of Systems: [Y] = yes [  ]=no Constitional: recent weight change [  ];  Wt loss over the last 3 months [   ] anorexia [  ]; fatigue [  ]; nausea [  ]; night sweats [  ]; fever [  ]; or chills [  ];          Dental: poor dentition[  ]; Last Dentist visit:   Eye : blurred vision [  ]; diplopia [   ]; vision changes [  ];  Amaurosis fugax[  ]; Resp: cough [  ];  wheezing[  ];  hemoptysis[  ]; shortness of breath[  ]; paroxysmal nocturnal dyspnea[  ]; dyspnea on exertion[ y ]; or orthopnea[y  ];  GI:  gallstones[  ], vomiting[  ];  dysphagia[  ]; melena[  ];  hematochezia [  ]; heartburn[  ];   Hx of  Colonoscopy[  ]; GU: kidney stones [  ]; hematuria[  ];   dysuria [  ];  nocturia[  ];  history of     obstruction [  ]; urinary frequency [  ]              Skin: rash, swelling[  ];, hair loss[  ];  peripheral edema[  ];  or itching[  ]; Musculosketetal: myalgias[  ];  joint swelling[  ];  joint erythema[  ];  joint pain[  ];  back pain[  ];  Heme/Lymph: bruising[  ];  bleeding[  ];  anemia[  ];  Neuro: TIA[  ];  headaches[  ];  stroke[  ];  vertigo[  ];  seizures[  ];   paresthesias[  ];  difficulty walking[  ];  Psych:depression[  ]; anxiety[  ];  Endocrine: diabetes[ y ];  thyroid dysfunction[  ];  Immunizations: Flu up to date [ y ]; Pneumococcal up to date [ y ];  Other:    PHYSICAL EXAMINATION: BP 108/60 (BP Location: Right Arm, Patient Position: Sitting, Cuff Size: Large)   Pulse 100   Resp 16   Ht  (1.575 m)   Wt 175 lb 12.8 oz (79.7 kg)   SpO2 95% Comment: ON RA  BMI 32.15 kg/m  General appearance: alert, cooperative, appears older than stated age and no distress Head: Normocephalic, without obvious abnormality, atraumatic Neck: no adenopathy, no carotid bruit, no JVD, supple, symmetrical, trachea midline and thyroid not enlarged, symmetric, no tenderness/mass/nodules Lymph nodes: Cervical, supraclavicular, and axillary nodes normal. Resp: clear to auscultation bilaterally Back: symmetric, no curvature. ROM normal. No CVA tenderness. Cardio: regular rate and rhythm, S1, S2 normal, no murmur, click, rub or gallop GI: soft, non-tender; bowel sounds normal; no masses,  no organomegaly Extremities: extremities normal, atraumatic, no cyanosis or edema and Homans sign is negative, no sign of DVT Neurologic: Grossly normal The left leg and a vein harvesting site of the left thigh is intact incision is well-healed Sternal incision also healed I do not feel any palpable instability on exam today  Diagnostic Studies & Laboratory data:     Recent Radiology Findings:   Dg Chest 1 View  Result Date: 03/03/2018 CLINICAL DATA:  Post cath, cough.  EXAM: CHEST  1 VIEW COMPARISON:  Chest radiograph 03/01/2018. FINDINGS: Cardiomegaly  with calcified tortuous aorta. Median sternotomy and CABG. Linear atelectasis in both lung zones, stable. No edema or consolidation. IMPRESSION: Stable chest.  No consolidation or edema. Electronically Signed   By: Elsie Stain M.D.   On: 03/03/2018 12:24   Dg Chest 2 View  Result Date: 03/08/2018 CLINICAL DATA:  Acute onset of cough and difficulty breathing. EXAM: CHEST - 2 VIEW COMPARISON:  Chest radiograph performed 03/06/2018 FINDINGS: The lungs are well-aerated. Mild bibasilar airspace opacities may reflect atelectasis or mild pneumonia, somewhat more prominent than on the prior study. There is no evidence of pleural effusion or pneumothorax. The heart is borderline normal in size. The patient is status post median sternotomy. No acute osseous abnormalities are seen. IMPRESSION: Mild bibasilar airspace opacities may reflect atelectasis or mild pneumonia, somewhat more prominent than on the prior study. Electronically Signed   By: Roanna Raider M.D.   On: 03/08/2018 02:26   Dg Chest 2 View  Result Date: 03/06/2018 CLINICAL DATA:  Cough, congestion shortness of breath for 4 weeks. EXAM: CHEST - 2 VIEW COMPARISON:  Single-view of the chest 03/03/2018. PA and lateral chest 02/09/2018. CT chest 03/01/2018. FINDINGS: Linear opacity in the left lung base has an appearance most suggestive of atelectasis but could represent pneumonia. There is a trace left pleural effusion. Cardiomegaly without edema is seen. The patient is status post CABG. Aortic atherosclerosis noted. No acute bony abnormality. IMPRESSION: Linear opacity left lung base has an appearance most suggestive of atelectasis but could represent pneumonia. Trace left pleural effusion. Cardiomegaly without edema. Electronically Signed   By: Drusilla Kanner M.D.   On: 03/06/2018 10:59   Dg Chest 2 View  Result Date: 03/01/2018 CLINICAL DATA:  Left arm and shoulder pain and left chest pain beginning this morning. EXAM: CHEST - 2 VIEW COMPARISON:   02/09/2018 FINDINGS: Previous median sternotomy and CABG. Chronic left ventricular prominence. Chronic aortic atherosclerosis and tortuosity. Hiatal hernia again noted. Worsened areas of linear atelectasis in both lower lungs. No effusions. Upper lungs remain clear. No pulmonary edema. IMPRESSION: New/worsened areas of linear atelectasis in both lower lungs. Electronically Signed   By: Paulina Fusi M.D.   On: 03/01/2018 07:58   Ct Angio Chest Pe W And/or Wo Contrast  Result Date: 03/01/2018 CLINICAL DATA:  Chest pain and shortness of breath EXAM: CT ANGIOGRAPHY CHEST WITH CONTRAST TECHNIQUE: Multidetector CT imaging of the chest was performed using the standard protocol during bolus administration of intravenous contrast. Multiplanar CT image reconstructions and MIPs were obtained to evaluate the vascular anatomy. CONTRAST:  80mL ISOVUE-370 IOPAMIDOL (ISOVUE-370) INJECTION 76% COMPARISON:  Plain film from earlier in same day FINDINGS: Cardiovascular: Thoracic aorta demonstrates mild atherosclerotic calcifications. Changes of prior coronary bypass grafting are noted. The pulmonary artery is well visualized with a normal branching pattern. No filling defects to suggest acute pulmonary emboli are identified. Coronary calcifications are seen. No cardiac enlargement is noted. Mediastinum/Nodes: The thoracic inlet demonstrates some calcifications within the left lobe of the thyroid. No definitive nodule is seen. Some fluid is noted within the esophagus although no definitive obstructive changes are seen. Large sliding-type hiatal hernia is noted. No hilar or mediastinal adenopathy is identified. Lungs/Pleura: Lungs are well aerated bilaterally. Scattered atelectatic changes are noted similar to that seen on recent chest x-ray. There is a focal 6 mm noncalcified nodule identified in the right upper lobe best seen on image number 22 of series 6.  No other significant nodular changes are noted. Upper Abdomen: Changes of  prior cholecystectomy. Bilateral renal cysts are seen. Musculoskeletal: Degenerative changes of the thoracic spine are noted. Changes consistent with prior median sternotomy are noted. There are multiple sternal and manubrial fractures identified of uncertain chronicity. Review of the MIP images confirms the above findings. IMPRESSION: No evidence of pulmonary emboli. 6 mm right upper lobe nodule. Non-contrast chest CT at 6-12 months is recommended. If the nodule is stable at time of repeat CT, then future CT at 18-24 months (from today's scan) is considered optional for low-risk patients, but is recommended for high-risk patients. This recommendation follows the consensus statement: Guidelines for Management of Incidental Pulmonary Nodules Detected on CT Images: From the Fleischner Society 2017; Radiology 2017; 284:228-243. Changes consistent with sternal fractures of uncertain chronicity. Correlation to point tenderness is recommended. Aortic Atherosclerosis (ICD10-I70.0). Electronically Signed   By: Alcide Clever M.D.   On: 03/01/2018 12:23     I have independently reviewed the above radiology studies  and reviewed the findings with the patient.   Recent Lab Findings: Lab Results  Component Value Date   WBC 10.3 03/07/2018   HGB 11.4 (L) 03/07/2018   HCT 35.5 (L) 03/07/2018   PLT 217 03/07/2018   GLUCOSE 147 (H) 03/19/2018   CHOL 84 (L) 02/09/2018   TRIG 90 02/09/2018   HDL 39 (L) 02/09/2018   LDLCALC 27 02/09/2018   ALT 13 02/09/2018   AST 14 02/09/2018   NA 141 03/19/2018   K 4.5 03/19/2018   CL 105 03/19/2018   CREATININE 1.22 (H) 03/19/2018   BUN 16 03/19/2018   CO2 28 03/19/2018   ECHO: Transthoracic Echocardiography  Patient:    Suha, Schoenbeck MR #:       [id number] Study Date: 03/02/2018 Gender:     F Age:        43 Height:     157.5 cm Weight:     84.8 kg BSA:        1.96 m^2 Pt. Status: Room:       6C05C   ADMITTING    Rollene Rotunda, MD  ATTENDING    Rollene Rotunda, MD  SONOGRAPHER  Roosvelt Maser, RDCS  PERFORMING   [facility], Inpatient  ORDERING     Bhagat, Bhavinkumar  REFERRING    Bhagat, Bhavinkumar  cc:  ------------------------------------------------------------------- LV EF: 40% -   45%  ------------------------------------------------------------------- Indications:      Chest pain 786.51.  ------------------------------------------------------------------- History:   PMH:   Coronary artery disease.  Risk factors: Hypertension.  ------------------------------------------------------------------- Study Conclusions  - Left ventricle: The cavity size was normal. There was mild   concentric hypertrophy. Systolic function was mildly to   moderately reduced. The estimated ejection fraction was in the   range of 40% to 45%. Mild diffuse hypokinesis. There was an   increased relative contribution of atrial contraction to   ventricular filling. Doppler parameters are consistent with   abnormal left ventricular relaxation (grade 1 diastolic   dysfunction). - Mitral valve: Calcified annulus. There was mild regurgitation. - Left atrium: The atrium was moderately to severely dilated. - Right atrium: The atrium was moderately dilated.  ------------------------------------------------------------------- Labs, prior tests, procedures, and surgery: Catheterization (current admission).  Coronary artery bypass grafting.  ------------------------------------------------------------------- Study data:  No prior study was available for comparison.  Study status:  Routine.  Procedure:  The patient reported no pain pre or post test. Transthoracic echocardiography. Image quality was adequate.  Study completion:  There were no complications. Transthoracic echocardiography.  M-mode, complete 2D, spectral Doppler, and color Doppler.  Birthdate:  Patient birthdate: [birth year].  Age:  Patient is 77 yr old.  Sex:  Gender: female. BMI: 34.2  kg/m^2.  Blood pressure:     127/54  Patient status: Inpatient.  Study date:  Study date: 03/02/2018. Study time: 03:56 PM.  Location:  Bedside.  -------------------------------------------------------------------  ------------------------------------------------------------------- Left ventricle:  The cavity size was normal. There was mild concentric hypertrophy. Systolic function was mildly to moderately reduced. The estimated ejection fraction was in the range of 40% to 45%.  Mild diffuse hypokinesis. There was an increased relative contribution of atrial contraction to ventricular filling. Doppler parameters are consistent with abnormal left ventricular relaxation (grade 1 diastolic dysfunction).  ------------------------------------------------------------------- Aortic valve:   Trileaflet; normal thickness leaflets. Mobility was not restricted.  Doppler:  Transvalvular velocity was within the normal range. There was no stenosis. There was no regurgitation.   ------------------------------------------------------------------- Aorta:  Aortic root: The aortic root was normal in size.  ------------------------------------------------------------------- Mitral valve:   Calcified annulus. Mobility was not restricted. Doppler:  Transvalvular velocity was within the normal range. There was no evidence for stenosis. There was mild regurgitation.  ------------------------------------------------------------------- Left atrium:  The atrium was moderately to severely dilated.   ------------------------------------------------------------------- Right ventricle:  The cavity size was normal. Wall thickness was normal. Systolic function was normal.  ------------------------------------------------------------------- Pulmonic valve:    Structurally normal valve.   Cusp separation was normal.  Doppler:  Transvalvular velocity was within the normal range. There was no evidence for  stenosis. There was no regurgitation.  ------------------------------------------------------------------- Tricuspid valve:   Structurally normal valve.    Doppler: Transvalvular velocity was within the normal range. There was no regurgitation.  ------------------------------------------------------------------- Pulmonary artery:   The main pulmonary artery was normal-sized. Systolic pressure could not be accurately estimated.  ------------------------------------------------------------------- Right atrium:  The atrium was moderately dilated.  ------------------------------------------------------------------- Pericardium:  There was no pericardial effusion.  ------------------------------------------------------------------- Systemic veins: Inferior vena cava: The vessel was normal in size. The respirophasic diameter changes were in the normal range (>= 50%), consistent with normal central venous pressure.  ------------------------------------------------------------------- Measurements   Left ventricle                           Value        Reference  LV ID, ED, PLAX chordal                  48.1  mm     43 - 52  LV ID, ES, PLAX chordal                  37.4  mm     23 - 38  LV fx shortening, PLAX chordal   (L)     22    %      >=29  LV PW thickness, ED                      13.2  mm     ----------  IVS/LV PW ratio, ED                      0.84         <=1.3  Stroke volume, 2D                        73  ml     ----------  Stroke volume/bsa, 2D                    37    ml/m^2 ----------  LV ejection fraction, 1-p A4C            45    %      ----------  LV end-diastolic volume, 2-p             166   ml     ----------  LV end-systolic volume, 2-p              87    ml     ----------  LV ejection fraction, 2-p                48    %      ----------  Stroke volume, 2-p                       79    ml     ----------  LV end-diastolic volume/bsa, 2-p         85    ml/m^2  ----------  LV end-systolic volume/bsa, 2-p          44    ml/m^2 ----------  Stroke volume/bsa, 2-p                   40.1  ml/m^2 ----------  LV e&', lateral                           7.29  cm/s   ----------  LV E/e&', lateral                         7.12         ----------  LV e&', medial                            3.48  cm/s   ----------  LV E/e&', medial                          14.91        ----------  LV e&', average                           5.39  cm/s   ----------  LV E/e&', average                         9.64         ----------    Ventricular septum                       Value        Reference  IVS thickness, ED                        11.1  mm     ----------    LVOT                                     Value        Reference  LVOT ID, S  22    mm     ----------  LVOT area                                3.8   cm^2   ----------  LVOT peak velocity, S                    88.7  cm/s   ----------  LVOT mean velocity, S                    65    cm/s   ----------  LVOT VTI, S                              19.2  cm     ----------    Aorta                                    Value        Reference  Aortic root ID, ED                       34    mm     ----------    Left atrium                              Value        Reference  LA ID, A-P, ES                           45    mm     ----------  LA ID/bsa, A-P                   (H)     2.29  cm/m^2 <=2.2  LA volume, S                             94.1  ml     ----------  LA volume/bsa, S                         47.9  ml/m^2 ----------  LA volume, ES, 1-p A4C                   84.6  ml     ----------  LA volume/bsa, ES, 1-p A4C               43.1  ml/m^2 ----------  LA volume, ES, 1-p A2C                   94.8  ml     ----------  LA volume/bsa, ES, 1-p A2C               48.3  ml/m^2 ----------    Mitral valve                             Value        Reference  Mitral E-wave peak velocity              51.9  cm/s   ----------  Mitral A-wave peak velocity              84.4  cm/s   ----------  Mitral deceleration time         (H)     250   ms     150 - 230  Mitral E/A ratio, peak                   0.6          ----------    Right atrium                             Value        Reference  RA ID, S-I, ES, A4C              (H)     49.1  mm     34 - 49  RA area, ES, A4C                 (H)     20.5  cm^2   8.3 - 19.5  RA volume, ES, A/L                       64.2  ml     ----------  RA volume/bsa, ES, A/L                   32.7  ml/m^2 ----------    Systemic veins                           Value        Reference  Estimated CVP                            3     mm Hg  ----------    Right ventricle                          Value        Reference  RV ID, minor axis, ED, A4C base          38.3  mm     ----------  TAPSE                                    15.2  mm     ----------  RV s&', lateral, S                        7.94  cm/s   ----------  Legend: (L)  and  (H)  mark values outside specified reference range.  ------------------------------------------------------------------- Prepared and Electronically Authenticated by  Armanda Magic, MD 2019-04-16T17:49:56  POST OP CATH/ STENT/DES : Procedures   CORONARY STENT INTERVENTION  LEFT HEART CATH AND CORS/GRAFTS ANGIOGRAPHY  Conclusion     Prox LAD lesion is 100% stenosed.  Ost 1st Diag lesion is 95% stenosed.  Ost Ramus to Ramus lesion is 30% stenosed.  LIMA graft was visualized by angiography and is normal in caliber.  Origin lesion is 100% stenosed.  SVG graft was visualized by angiography and is normal in caliber.  Mid LM to Dist LM lesion is 40% stenosed.  Suezanne Jacquet  RPDA lesion is 99% stenosed.  Prox RCA to Mid RCA lesion is 40% stenosed.  A drug-eluting stent was successfully placed using a STENT SIERRA 2.25 X 15 MM.  Post intervention, there is a 0% residual stenosis.   1. 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 The LAD is occluded proximally just beyond the takeoff of the early Diagonal branch. The entire LAD fills from the patent LIMA graft. The Diagonal branch (intermediate branch) has a 95% proximal stenosis. The vein graft that presumably went to this branch is occluded.  3. Successful PTCA/DES x 1 Diagonal (intermediate branch).  4. The Circumflex and another intermediate branch are small to moderate in caliber and have minor plaque.  5. The RCA is a large dominant vessel. There is diffuse 40% stenosis in the proximal/mid and distal RCA. The PDA is small to moderate in caliber. The ostium of the PDA has a 99% stenosis. The PDA fills from the patent vein graft.   Recommendations: Continue DAPT with ASA and Plavix for at least one year. Continue statin and beta blocker. Echo to assess LV function.     I have independently reviewed the above  cath films and reviewed the findings with the  patient .      Dg Chest 1 View  Result Date: 03/03/2018 CLINICAL DATA:  Post cath, cough. EXAM: CHEST  1 VIEW COMPARISON:  Chest radiograph 03/01/2018. FINDINGS: Cardiomegaly with calcified tortuous aorta. Median sternotomy and CABG. Linear atelectasis in both lung zones, stable. No edema or consolidation. IMPRESSION: Stable chest.  No consolidation or edema. Electronically Signed   By: Elsie Stain M.D.   On: 03/03/2018 12:24   Dg Chest 2 View  Result Date: 03/08/2018 CLINICAL DATA:  Acute onset of cough and difficulty breathing. EXAM: CHEST - 2 VIEW COMPARISON:  Chest radiograph performed 03/06/2018 FINDINGS: The lungs are well-aerated. Mild bibasilar airspace opacities may reflect atelectasis or mild pneumonia, somewhat more prominent than on the prior study. There is no evidence of pleural effusion or pneumothorax. The heart is borderline normal in size. The patient is status post median sternotomy. No acute osseous abnormalities are seen. IMPRESSION: Mild bibasilar airspace opacities  may reflect atelectasis or mild pneumonia, somewhat more prominent than on the prior study. Electronically Signed   By: Roanna Raider M.D.   On: 03/08/2018 02:26   Dg Chest 2 View  Result Date: 03/06/2018 CLINICAL DATA:  Cough, congestion shortness of breath for 4 weeks. EXAM: CHEST - 2 VIEW COMPARISON:  Single-view of the chest 03/03/2018. PA and lateral chest 02/09/2018. CT chest 03/01/2018. FINDINGS: Linear opacity in the left lung base has an appearance most suggestive of atelectasis but could represent pneumonia. There is a trace left pleural effusion. Cardiomegaly without edema is seen. The patient is status post CABG. Aortic atherosclerosis noted. No acute bony abnormality. IMPRESSION: Linear opacity left lung base has an appearance most suggestive of atelectasis but could represent pneumonia. Trace left pleural effusion. Cardiomegaly without edema. Electronically Signed   By: Drusilla Kanner M.D.   On: 03/06/2018 10:59   Dg Chest 2 View  Result Date: 03/01/2018 CLINICAL DATA:  Left arm and shoulder pain and left chest pain beginning this morning. EXAM: CHEST - 2 VIEW COMPARISON:  02/09/2018 FINDINGS: Previous median sternotomy and CABG. Chronic left ventricular prominence. Chronic aortic atherosclerosis and tortuosity. Hiatal hernia again noted. Worsened areas of linear atelectasis in both lower lungs. No effusions. Upper lungs remain clear. No pulmonary edema.  IMPRESSION: New/worsened areas of linear atelectasis in both lower lungs. Electronically Signed   By: Paulina Fusi M.D.   On: 03/01/2018 07:58   Ct Angio Chest Pe W And/or Wo Contrast  Result Date: 03/01/2018 CLINICAL DATA:  Chest pain and shortness of breath EXAM: CT ANGIOGRAPHY CHEST WITH CONTRAST TECHNIQUE: Multidetector CT imaging of the chest was performed using the standard protocol during bolus administration of intravenous contrast. Multiplanar CT image reconstructions and MIPs were obtained to evaluate the vascular anatomy.  CONTRAST:  80mL ISOVUE-370 IOPAMIDOL (ISOVUE-370) INJECTION 76% COMPARISON:  Plain film from earlier in same day FINDINGS: Cardiovascular: Thoracic aorta demonstrates mild atherosclerotic calcifications. Changes of prior coronary bypass grafting are noted. The pulmonary artery is well visualized with a normal branching pattern. No filling defects to suggest acute pulmonary emboli are identified. Coronary calcifications are seen. No cardiac enlargement is noted. Mediastinum/Nodes: The thoracic inlet demonstrates some calcifications within the left lobe of the thyroid. No definitive nodule is seen. Some fluid is noted within the esophagus although no definitive obstructive changes are seen. Large sliding-type hiatal hernia is noted. No hilar or mediastinal adenopathy is identified. Lungs/Pleura: Lungs are well aerated bilaterally. Scattered atelectatic changes are noted similar to that seen on recent chest x-ray. There is a focal 6 mm noncalcified nodule identified in the right upper lobe best seen on image number 22 of series 6. No other significant nodular changes are noted. Upper Abdomen: Changes of prior cholecystectomy. Bilateral renal cysts are seen. Musculoskeletal: Degenerative changes of the thoracic spine are noted. Changes consistent with prior median sternotomy are noted. There are multiple sternal and manubrial fractures identified of uncertain chronicity. Review of the MIP images confirms the above findings. IMPRESSION: No evidence of pulmonary emboli. 6 mm right upper lobe nodule. Non-contrast chest CT at 6-12 months is recommended. If the nodule is stable at time of repeat CT, then future CT at 18-24 months (from today's scan) is considered optional for low-risk patients, but is recommended for high-risk patients. This recommendation follows the consensus statement: Guidelines for Management of Incidental Pulmonary Nodules Detected on CT Images: From the Fleischner Society 2017; Radiology 2017;  284:228-243. Changes consistent with sternal fractures of uncertain chronicity. Correlation to point tenderness is recommended. Aortic Atherosclerosis (ICD10-I70.0). Electronically Signed   By: Alcide Clever M.D.   On: 03/01/2018 12:23  I have independently reviewed the above radiology studies  and reviewed the findings with the patient.    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 2/6 mm right lung nodule-will need follow-up CT scan in 8 months 3/Depressed LV function with ejection fraction 40 to 45% by echocardiogram  Reviewed these findings with the patient and her daughter, the patient is considering moving to [facility] permanently, will make a point follow-up appointment with a CT scan in 8 months to evaluate the 6 mm lung nodule, at the same time we will be able to evaluate sternal healing  The patient was cautioned about any strenuous lifting.  I  spent 45 minutes with  the patient face to face and greater then 50% of the time was spent in counseling and coordination of care.    Delight Ovens MD      [address] [place].[address] [place] [address] Office [phone]  Beeper [phone]  03/29/2018 1:26 PM

## 2018-03-29 NOTE — Patient Instructions (Addendum)
Medication Instructions:  Your physician has recommended you make the following change in your medication:  INCREASE furosemide (Lasix) to 40 mg twice daily on Monday, Wednesday, Friday  Use your inhaler twice daily  Labwork: Your physician recommends that you return for lab work in: 1 week. BMP, BNP. You do not need to be fasting. We are open Monday-Friday 8am-5pm and close for lunch from 12pm-1pm.  Testing/Procedures: None  Follow-Up: Your physician recommends that you schedule a follow-up appointment in: 4 weeks.  Any Other Special Instructions Will Be Listed Below (If Applicable).     If you need a refill on your cardiac medications before your next appointment, please call your pharmacy.    Heart Failure  Weigh yourself every morning when you first wake up and record on a calender or note pad, bring this to your office visits. Using a pill tender can help with taking your medications consistently.  Limit your fluid intake to 2 liters daily  Limit your sodium intake to less than 2-3 grams daily. Ask if you need dietary teaching.  If you gain more than 3 pounds (from your dry weight ), double your dose of diuretic for the day.  If you gain more than 5 pounds (from your dry weight), double your dose of lasix and call your heart failure doctor.  Please do not smoke tobacco since it is very bad for your heart.  Please do not drink alcohol since it can worsen your heart failure.Also avoid OTC nonsteroidal drugs, such as advil, aleve and motrin.  Try to exercise for at least 30 minutes every day because this will help your heart be more efficient. You may be eligible for supervised cardiac rehab, ask your physician.

## 2018-03-29 NOTE — Patient Instructions (Signed)
    [address]       [place] [address]             [phone]       Coronary Artery Bypass Grafting  Care After  Refer to this sheet in the next few weeks. These instructions provide you with information on caring for yourself after your procedure. Your caregiver may also give you more specific instructions. Your treatment has been planned according to current medical practices, but problems sometimes occur. Call your caregiver if you have any problems or questions after your procedure.  Recovery from open heart surgery will be different for everyone. Some people feel well after 3 or 4 weeks, while for others it takes longer. After heart surgery, it may be normal to:  Not have an appetite, feel nauseated by the smell of food, or only want to eat a small amount.   Be constipated because of changes in your diet, activity, and medicines. Eat foods high in fiber. Add fresh fruits and vegetables to your diet. Stool softeners may be helpful.   Feel sad or unhappy. You may be frustrated or cranky. You may have good days and bad days. Do not give up. Talk to your caregiver if you do not feel better.   Feel weakness and fatigue. You many need physical therapy or cardiac rehabilitation to get your strength back.   Develop an irregular heartbeat called atrial fibrillation. Symptoms of atrial fibrillation are a fast, irregular heartbeat or feelings of fluttery heartbeats, shortness of breath, low blood pressure, and dizziness. If these symptoms develop, see your caregiver right away.  MEDICATION  Have a list of all the medicines you will be taking when you leave the hospital. For every medicine, know the following:   Name.   Exact dose.   Time of day to be taken.   How often it should be taken.   Why you are taking it.   Ask which medicines should or should not be taken together. If you take more than one heart medicine, ask if it is okay to take them together. Some  heart medicines should not be taken at the same time because they may lower your blood pressure too much.   Narcotic pain medicine can cause constipation. Eat fresh fruits and vegetables. Add fiber to your diet. Stool softener medicine may help relieve constipation.   Keep a copy of your medicines with you at all times.   Do not add or stop taking any medicine until you check with your caregiver.   Medicines can have side effects. Call your caregiver who prescribed the medicine if you:   Start throwing up, have diarrhea, or have stomach pain.   Feel dizzy or lightheaded when you stand up.   Feel your heart is skipping beats or is beating too fast or too slow.   Develop a rash.   Notice unusual bruising or bleeding.  HOME CARE INSTRUCTIONS  After heart surgery, it is important to learn how to take your pulse. Have your caregiver show you how to take your pulse.   Use your incentive spirometer. Ask your caregiver how long after surgery you need to use it.  Care of your chest incision  Tell your caregiver right away if you notice clicking in your chest (sternum).   Support your chest with a pillow or your arms when you take deep breaths and cough.   Follow your caregiver's instructions about when you can bathe or   swim.   Protect your incision from sunlight during the first year to keep the scar from getting dark.   Tell your caregiver if you notice:   Increased tenderness of your incision.   Increased redness or swelling around your incision.   Drainage or pus from your incision.  Care of your leg incision(s)  Avoid crossing your legs.   Avoid sitting for long periods of time. Change positions every half hour.   Elevate your leg(s) when you are sitting.   Check your leg(s) daily for swelling. Check the incisions for redness or drainage.   Diet is very important to heart health.   Eat plenty of fresh fruits and vegetables. Meats should be lean cut. Avoid canned,  processed, and fried foods.   Talk to a dietician. They can teach you how to make healthy food and drink choices.  Weight  Weigh yourself every day. This is important because it helps to know if you are retaining fluid that may make your heart and lungs work harder.   Use the same scale each time.   Weigh yourself every morning at the same time. You should do this after you go to the bathroom, but before you eat breakfast.   Your weight will be more accurate if you do not wear any clothes.   Record your weight.   Tell your caregiver if you have gained 2 pounds or more overnight.  Activity Stop any activity at once if you have chest pain, shortness of breath, irregular heartbeats, or dizziness. Get help right away if you have any of these symptoms.  Bathing.  Avoid soaking in a bath or hot tub until your incisions are healed.   Rest. You need a balance of rest and activity.   Exercise. Exercise per your caregiver's advice. You may need physical therapy or cardiac rehabilitation to help strengthen your muscles and build your endurance.   Climbing stairs. Unless your caregiver tells you not to climb stairs, go up stairs slowly and rest if you tire. Do not pull yourself up by the handrail.   Driving a car. Follow your caregiver's advice on when you may drive. You may ride as a passenger at any time. When traveling for long periods of time in a car, get out of the car and walk around for a few minutes every 2 hours.   Lifting. Avoid lifting, pushing, or pulling anything heavier than 10 pounds for 6 weeks after surgery or as told by your caregiver.   Returning to work. Check with your caregiver. People heal at different rates. Most people will be able to go back to work 6 to 12 weeks after surgery.   Sexual activity. You may resume sexual relations as told by your caregiver.  SEEK MEDICAL CARE IF:  Any of your incisions are red, painful, or have any type of drainage coming from them.     You have an oral temperature above 101.5 F .   You have ankle or leg swelling.   You have pain in your legs.   You have weight gain of 2 or more pounds a day.   You feel dizzy or lightheaded when you stand up.  SEEK IMMEDIATE MEDICAL CARE IF:  You have angina or chest pain that goes to your jaw or arms. Call your local emergency services right away.   You have shortness of breath at rest or with activity.   You have a fast or irregular heartbeat (arrhythmia).   There is   a "clicking" in your sternum when you move.   You have numbness or weakness in your arms or legs.  MAKE SURE YOU:  Understand these instructions.   Will watch your condition.   Will get help right away if you are not doing well or get worse.    No lifting over 25 lbs for 3 months post op

## 2018-03-30 ENCOUNTER — Telehealth: Payer: Self-pay | Admitting: *Deleted

## 2018-03-30 DIAGNOSIS — I1310 Hypertensive heart and chronic kidney disease without heart failure, with stage 1 through stage 4 chronic kidney disease, or unspecified chronic kidney disease: Secondary | ICD-10-CM | POA: Diagnosis not present

## 2018-03-30 DIAGNOSIS — Z951 Presence of aortocoronary bypass graft: Secondary | ICD-10-CM | POA: Diagnosis not present

## 2018-03-30 DIAGNOSIS — Z48812 Encounter for surgical aftercare following surgery on the circulatory system: Secondary | ICD-10-CM | POA: Diagnosis not present

## 2018-03-30 DIAGNOSIS — E1122 Type 2 diabetes mellitus with diabetic chronic kidney disease: Secondary | ICD-10-CM | POA: Diagnosis not present

## 2018-03-30 DIAGNOSIS — I2510 Atherosclerotic heart disease of native coronary artery without angina pectoris: Secondary | ICD-10-CM | POA: Diagnosis not present

## 2018-03-30 DIAGNOSIS — N189 Chronic kidney disease, unspecified: Secondary | ICD-10-CM | POA: Diagnosis not present

## 2018-03-30 NOTE — Telephone Encounter (Signed)
Received Physician Orders from [facility]; forwarded to provider/SLS 05/14

## 2018-03-31 ENCOUNTER — Other Ambulatory Visit: Payer: Self-pay

## 2018-04-01 ENCOUNTER — Ambulatory Visit: Payer: Medicare Other | Admitting: Cardiothoracic Surgery

## 2018-04-01 DIAGNOSIS — I2510 Atherosclerotic heart disease of native coronary artery without angina pectoris: Secondary | ICD-10-CM | POA: Diagnosis not present

## 2018-04-01 DIAGNOSIS — Z951 Presence of aortocoronary bypass graft: Secondary | ICD-10-CM | POA: Diagnosis not present

## 2018-04-01 DIAGNOSIS — N189 Chronic kidney disease, unspecified: Secondary | ICD-10-CM | POA: Diagnosis not present

## 2018-04-01 DIAGNOSIS — Z48812 Encounter for surgical aftercare following surgery on the circulatory system: Secondary | ICD-10-CM | POA: Diagnosis not present

## 2018-04-01 DIAGNOSIS — I1310 Hypertensive heart and chronic kidney disease without heart failure, with stage 1 through stage 4 chronic kidney disease, or unspecified chronic kidney disease: Secondary | ICD-10-CM | POA: Diagnosis not present

## 2018-04-01 DIAGNOSIS — E1122 Type 2 diabetes mellitus with diabetic chronic kidney disease: Secondary | ICD-10-CM | POA: Diagnosis not present

## 2018-04-02 DIAGNOSIS — Z48812 Encounter for surgical aftercare following surgery on the circulatory system: Secondary | ICD-10-CM | POA: Diagnosis not present

## 2018-04-02 DIAGNOSIS — N189 Chronic kidney disease, unspecified: Secondary | ICD-10-CM | POA: Diagnosis not present

## 2018-04-02 DIAGNOSIS — I2510 Atherosclerotic heart disease of native coronary artery without angina pectoris: Secondary | ICD-10-CM | POA: Diagnosis not present

## 2018-04-02 DIAGNOSIS — E1122 Type 2 diabetes mellitus with diabetic chronic kidney disease: Secondary | ICD-10-CM | POA: Diagnosis not present

## 2018-04-02 DIAGNOSIS — I1310 Hypertensive heart and chronic kidney disease without heart failure, with stage 1 through stage 4 chronic kidney disease, or unspecified chronic kidney disease: Secondary | ICD-10-CM | POA: Diagnosis not present

## 2018-04-02 DIAGNOSIS — Z951 Presence of aortocoronary bypass graft: Secondary | ICD-10-CM | POA: Diagnosis not present

## 2018-04-06 ENCOUNTER — Telehealth: Payer: Self-pay | Admitting: Family Medicine

## 2018-04-06 DIAGNOSIS — Z951 Presence of aortocoronary bypass graft: Secondary | ICD-10-CM | POA: Diagnosis not present

## 2018-04-06 DIAGNOSIS — E1122 Type 2 diabetes mellitus with diabetic chronic kidney disease: Secondary | ICD-10-CM | POA: Diagnosis not present

## 2018-04-06 DIAGNOSIS — I25118 Atherosclerotic heart disease of native coronary artery with other forms of angina pectoris: Secondary | ICD-10-CM | POA: Diagnosis not present

## 2018-04-06 DIAGNOSIS — Z48812 Encounter for surgical aftercare following surgery on the circulatory system: Secondary | ICD-10-CM | POA: Diagnosis not present

## 2018-04-06 DIAGNOSIS — I1310 Hypertensive heart and chronic kidney disease without heart failure, with stage 1 through stage 4 chronic kidney disease, or unspecified chronic kidney disease: Secondary | ICD-10-CM | POA: Diagnosis not present

## 2018-04-06 DIAGNOSIS — I2510 Atherosclerotic heart disease of native coronary artery without angina pectoris: Secondary | ICD-10-CM | POA: Diagnosis not present

## 2018-04-06 DIAGNOSIS — I5032 Chronic diastolic (congestive) heart failure: Secondary | ICD-10-CM | POA: Diagnosis not present

## 2018-04-06 DIAGNOSIS — N189 Chronic kidney disease, unspecified: Secondary | ICD-10-CM | POA: Diagnosis not present

## 2018-04-06 NOTE — Telephone Encounter (Signed)
Copied from CRM [id number]. Topic: Quick Communication - See Telephone Encounter >> Apr 06, 2018  1:58 PM Raquel Sarna wrote: [facility] [phone]  Verbal orders: Recertify pt for nursing and pt for a little longer.

## 2018-04-06 NOTE — Telephone Encounter (Signed)
Called [facility]) informed ok per PCP request.

## 2018-04-07 DIAGNOSIS — I1310 Hypertensive heart and chronic kidney disease without heart failure, with stage 1 through stage 4 chronic kidney disease, or unspecified chronic kidney disease: Secondary | ICD-10-CM | POA: Diagnosis not present

## 2018-04-07 DIAGNOSIS — I2510 Atherosclerotic heart disease of native coronary artery without angina pectoris: Secondary | ICD-10-CM | POA: Diagnosis not present

## 2018-04-07 DIAGNOSIS — Z951 Presence of aortocoronary bypass graft: Secondary | ICD-10-CM | POA: Diagnosis not present

## 2018-04-07 DIAGNOSIS — N189 Chronic kidney disease, unspecified: Secondary | ICD-10-CM | POA: Diagnosis not present

## 2018-04-07 DIAGNOSIS — E1122 Type 2 diabetes mellitus with diabetic chronic kidney disease: Secondary | ICD-10-CM | POA: Diagnosis not present

## 2018-04-07 DIAGNOSIS — Z48812 Encounter for surgical aftercare following surgery on the circulatory system: Secondary | ICD-10-CM | POA: Diagnosis not present

## 2018-04-07 LAB — BASIC METABOLIC PANEL
BUN / CREAT RATIO: 18 (ref 12–28)
BUN: 24 mg/dL (ref 8–27)
CHLORIDE: 106 mmol/L (ref 96–106)
CO2: 22 mmol/L (ref 20–29)
Calcium: 8.8 mg/dL (ref 8.7–10.3)
Creatinine, Ser: 1.34 mg/dL — ABNORMAL HIGH (ref 0.57–1.00)
GFR, EST AFRICAN AMERICAN: 44 mL/min/{1.73_m2} — AB (ref >59)
GFR, EST NON AFRICAN AMERICAN: 38 mL/min/{1.73_m2} — AB (ref >59)
Glucose: 148 mg/dL — ABNORMAL HIGH (ref 65–99)
POTASSIUM: 4.5 mmol/L (ref 3.5–5.2)
Sodium: 143 mmol/L (ref 134–144)

## 2018-04-07 LAB — PRO B NATRIURETIC PEPTIDE: NT-Pro BNP: 2203 pg/mL — ABNORMAL HIGH (ref 0–738)

## 2018-04-08 DIAGNOSIS — N189 Chronic kidney disease, unspecified: Secondary | ICD-10-CM | POA: Diagnosis not present

## 2018-04-08 DIAGNOSIS — K219 Gastro-esophageal reflux disease without esophagitis: Secondary | ICD-10-CM | POA: Diagnosis not present

## 2018-04-08 DIAGNOSIS — M1990 Unspecified osteoarthritis, unspecified site: Secondary | ICD-10-CM | POA: Diagnosis not present

## 2018-04-08 DIAGNOSIS — R2681 Unsteadiness on feet: Secondary | ICD-10-CM | POA: Diagnosis not present

## 2018-04-08 DIAGNOSIS — Z794 Long term (current) use of insulin: Secondary | ICD-10-CM | POA: Diagnosis not present

## 2018-04-08 DIAGNOSIS — Z7982 Long term (current) use of aspirin: Secondary | ICD-10-CM | POA: Diagnosis not present

## 2018-04-08 DIAGNOSIS — Z951 Presence of aortocoronary bypass graft: Secondary | ICD-10-CM | POA: Diagnosis not present

## 2018-04-08 DIAGNOSIS — R531 Weakness: Secondary | ICD-10-CM | POA: Diagnosis not present

## 2018-04-08 DIAGNOSIS — I2510 Atherosclerotic heart disease of native coronary artery without angina pectoris: Secondary | ICD-10-CM | POA: Diagnosis not present

## 2018-04-08 DIAGNOSIS — Z7902 Long term (current) use of antithrombotics/antiplatelets: Secondary | ICD-10-CM | POA: Diagnosis not present

## 2018-04-08 DIAGNOSIS — E1122 Type 2 diabetes mellitus with diabetic chronic kidney disease: Secondary | ICD-10-CM | POA: Diagnosis not present

## 2018-04-08 DIAGNOSIS — F17211 Nicotine dependence, cigarettes, in remission: Secondary | ICD-10-CM | POA: Diagnosis not present

## 2018-04-08 DIAGNOSIS — Z48812 Encounter for surgical aftercare following surgery on the circulatory system: Secondary | ICD-10-CM | POA: Diagnosis not present

## 2018-04-08 DIAGNOSIS — I252 Old myocardial infarction: Secondary | ICD-10-CM | POA: Diagnosis not present

## 2018-04-08 DIAGNOSIS — I1310 Hypertensive heart and chronic kidney disease without heart failure, with stage 1 through stage 4 chronic kidney disease, or unspecified chronic kidney disease: Secondary | ICD-10-CM | POA: Diagnosis not present

## 2018-04-13 ENCOUNTER — Telehealth: Payer: Self-pay | Admitting: Family Medicine

## 2018-04-13 DIAGNOSIS — I2510 Atherosclerotic heart disease of native coronary artery without angina pectoris: Secondary | ICD-10-CM | POA: Diagnosis not present

## 2018-04-13 DIAGNOSIS — R2681 Unsteadiness on feet: Secondary | ICD-10-CM | POA: Diagnosis not present

## 2018-04-13 DIAGNOSIS — I1310 Hypertensive heart and chronic kidney disease without heart failure, with stage 1 through stage 4 chronic kidney disease, or unspecified chronic kidney disease: Secondary | ICD-10-CM | POA: Diagnosis not present

## 2018-04-13 DIAGNOSIS — I252 Old myocardial infarction: Secondary | ICD-10-CM | POA: Diagnosis not present

## 2018-04-13 DIAGNOSIS — E1122 Type 2 diabetes mellitus with diabetic chronic kidney disease: Secondary | ICD-10-CM | POA: Diagnosis not present

## 2018-04-13 DIAGNOSIS — R531 Weakness: Secondary | ICD-10-CM | POA: Diagnosis not present

## 2018-04-13 NOTE — Telephone Encounter (Signed)
Copied from CRM [id number]. Topic: Inquiry >> Apr 13, 2018  9:57 AM Leafy Ro wrote: Reason for CRM: [facility] is requesting verbal order for physical therapy for once a wk for 1 wk and then twice a wk for 2 wks and finished with once a wk for 2 wks

## 2018-04-13 NOTE — Telephone Encounter (Signed)
informed

## 2018-04-14 ENCOUNTER — Telehealth: Payer: Self-pay | Admitting: *Deleted

## 2018-04-14 NOTE — Telephone Encounter (Signed)
Received Patient Episode Summary Report from [facility]; forwarded to provider for review/SLS 05/29  

## 2018-04-15 ENCOUNTER — Telehealth: Payer: Self-pay | Admitting: *Deleted

## 2018-04-15 DIAGNOSIS — R531 Weakness: Secondary | ICD-10-CM | POA: Diagnosis not present

## 2018-04-15 DIAGNOSIS — I252 Old myocardial infarction: Secondary | ICD-10-CM | POA: Diagnosis not present

## 2018-04-15 DIAGNOSIS — R2681 Unsteadiness on feet: Secondary | ICD-10-CM | POA: Diagnosis not present

## 2018-04-15 DIAGNOSIS — E1122 Type 2 diabetes mellitus with diabetic chronic kidney disease: Secondary | ICD-10-CM | POA: Diagnosis not present

## 2018-04-15 DIAGNOSIS — I2510 Atherosclerotic heart disease of native coronary artery without angina pectoris: Secondary | ICD-10-CM | POA: Diagnosis not present

## 2018-04-15 DIAGNOSIS — I1310 Hypertensive heart and chronic kidney disease without heart failure, with stage 1 through stage 4 chronic kidney disease, or unspecified chronic kidney disease: Secondary | ICD-10-CM | POA: Diagnosis not present

## 2018-04-15 NOTE — Telephone Encounter (Signed)
Received Home Health Certification and Plan of Care; forwarded to provider/SLS 05/30

## 2018-04-19 DIAGNOSIS — I252 Old myocardial infarction: Secondary | ICD-10-CM | POA: Diagnosis not present

## 2018-04-19 DIAGNOSIS — R2681 Unsteadiness on feet: Secondary | ICD-10-CM | POA: Diagnosis not present

## 2018-04-19 DIAGNOSIS — I2510 Atherosclerotic heart disease of native coronary artery without angina pectoris: Secondary | ICD-10-CM | POA: Diagnosis not present

## 2018-04-19 DIAGNOSIS — E1122 Type 2 diabetes mellitus with diabetic chronic kidney disease: Secondary | ICD-10-CM | POA: Diagnosis not present

## 2018-04-19 DIAGNOSIS — R531 Weakness: Secondary | ICD-10-CM | POA: Diagnosis not present

## 2018-04-19 DIAGNOSIS — I1310 Hypertensive heart and chronic kidney disease without heart failure, with stage 1 through stage 4 chronic kidney disease, or unspecified chronic kidney disease: Secondary | ICD-10-CM | POA: Diagnosis not present

## 2018-04-22 DIAGNOSIS — I2510 Atherosclerotic heart disease of native coronary artery without angina pectoris: Secondary | ICD-10-CM | POA: Diagnosis not present

## 2018-04-22 DIAGNOSIS — E1122 Type 2 diabetes mellitus with diabetic chronic kidney disease: Secondary | ICD-10-CM | POA: Diagnosis not present

## 2018-04-22 DIAGNOSIS — I252 Old myocardial infarction: Secondary | ICD-10-CM | POA: Diagnosis not present

## 2018-04-22 DIAGNOSIS — R531 Weakness: Secondary | ICD-10-CM | POA: Diagnosis not present

## 2018-04-22 DIAGNOSIS — R2681 Unsteadiness on feet: Secondary | ICD-10-CM | POA: Diagnosis not present

## 2018-04-22 DIAGNOSIS — I1310 Hypertensive heart and chronic kidney disease without heart failure, with stage 1 through stage 4 chronic kidney disease, or unspecified chronic kidney disease: Secondary | ICD-10-CM | POA: Diagnosis not present

## 2018-04-23 ENCOUNTER — Encounter: Payer: Self-pay | Admitting: Family Medicine

## 2018-04-23 ENCOUNTER — Ambulatory Visit (INDEPENDENT_AMBULATORY_CARE_PROVIDER_SITE_OTHER): Payer: Medicare Other | Admitting: Family Medicine

## 2018-04-23 VITALS — BP 120/70 | HR 79 | Temp 98.0°F | Ht 62.0 in | Wt 175.2 lb

## 2018-04-23 DIAGNOSIS — E669 Obesity, unspecified: Secondary | ICD-10-CM | POA: Diagnosis not present

## 2018-04-23 DIAGNOSIS — E1169 Type 2 diabetes mellitus with other specified complication: Secondary | ICD-10-CM | POA: Diagnosis not present

## 2018-04-23 DIAGNOSIS — R2681 Unsteadiness on feet: Secondary | ICD-10-CM | POA: Diagnosis not present

## 2018-04-23 DIAGNOSIS — E1122 Type 2 diabetes mellitus with diabetic chronic kidney disease: Secondary | ICD-10-CM | POA: Diagnosis not present

## 2018-04-23 DIAGNOSIS — I1310 Hypertensive heart and chronic kidney disease without heart failure, with stage 1 through stage 4 chronic kidney disease, or unspecified chronic kidney disease: Secondary | ICD-10-CM | POA: Diagnosis not present

## 2018-04-23 DIAGNOSIS — I252 Old myocardial infarction: Secondary | ICD-10-CM | POA: Diagnosis not present

## 2018-04-23 DIAGNOSIS — I2510 Atherosclerotic heart disease of native coronary artery without angina pectoris: Secondary | ICD-10-CM | POA: Diagnosis not present

## 2018-04-23 DIAGNOSIS — R531 Weakness: Secondary | ICD-10-CM | POA: Diagnosis not present

## 2018-04-23 HISTORY — DX: Type 2 diabetes mellitus with other specified complication: E66.9

## 2018-04-23 HISTORY — DX: Type 2 diabetes mellitus with other specified complication: E11.69

## 2018-04-23 LAB — HEMOGLOBIN A1C: Hgb A1c MFr Bld: 6 % (ref 4.6–6.5)

## 2018-04-23 NOTE — Progress Notes (Signed)
Subjective:   Chief Complaint  Patient presents with  . Follow-up    [name] is a 77 y.o. female here for follow-up of diabetes.  She is here with her daughter. [name]'s self monitored glucose range is low 100's. Patient denies hypoglycemic reactions. She checks her glucose levels 1 times per day. Patient does require insulin.  She had her insulin dosage lowered from 18 units nightly to 9 units nightly given her good control. Medications include: no oral meds Reports diet is healthy overall. Patient exercises 7 days per week on average.    Past Medical History:  Diagnosis Date  . Arthritis    "hands, arms, all over the place" (03/02/2018)  . Chest pain 02/05/2018  . Chronic lower back pain   . GERD (gastroesophageal reflux disease)   . Glass eye    right eye  . Gout    "on daily RX" (03/02/2018)  . Heart disease   . History of kidney stones   . Hyperlipidemia   . Hypertension   . Kidney cysts   . Kidney disease   . Migraine    "used to get them twice/week; haven't had them in a long time" (03/02/2018)  . NSTEMI (non-ST elevated myocardial infarction) (HCC) 03/01/2018  . On home oxygen therapy    "3L at night and prn during the day" (03/02/2018)  . Pneumonia    used to get it twice/year; stopped after I had hysterectomy" (03/02/2018)  . Sleep apnea   . Type II diabetes mellitus (HCC)     Past Surgical History:  Procedure Laterality Date  . BACK SURGERY    . CARDIAC CATHETERIZATION  12/2017   "before OHS"  . CHOLECYSTECTOMY OPEN  1982  . CORONARY ARTERY BYPASS GRAFT  01/19/2018   [place]  . CORONARY STENT INTERVENTION N/A 03/02/2018   Procedure: CORONARY STENT INTERVENTION;  Surgeon: Kathleene HazelMcAlhany, Christopher D, MD;  [facility];  Service: Cardiovascular;  Laterality: N/A;  . DILATION AND CURETTAGE OF UTERUS    . 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 KNEE ARTHROSCOPY Right X 4  . LEFT HEART CATH AND CORS/GRAFTS  ANGIOGRAPHY N/A 03/02/2018   Procedure: LEFT HEART CATH AND CORS/GRAFTS ANGIOGRAPHY;  Surgeon: Kathleene HazelMcAlhany, Christopher D, MD;  [facility];  Service: Cardiovascular;  Laterality: N/A;  . LUMBAR DISC SURGERY    . REDUCTION MAMMAPLASTY Bilateral X 2  . TONSILLECTOMY  1953  . VAGINAL HYSTERECTOMY  1970    Family History  Problem Relation Age of Onset  . Heart Problems Mother   . Diabetes Father   . Kidney disease Sister   . Heart disease Brother   . Hypertension Brother   . Kidney disease Brother   . Hypertension Daughter   . Hypertension Daughter   . Hypertension Daughter   . Hypertension Daughter    Current Outpatient Medications on File Prior to Visit  Medication Sig Dispense Refill  . acetaminophen (TYLENOL) 500 MG tablet Take 500 mg by mouth every 6 (six) hours as needed for headache.    . albuterol (PROVENTIL HFA;VENTOLIN HFA) 108 (90 Base) MCG/ACT inhaler Inhale 2 puffs into the lungs every 6 (six) hours as needed for wheezing or shortness of breath. 1 Inhaler 2  . allopurinol (ZYLOPRIM) 100 MG tablet Take 100 mg by mouth daily.    Marland Kitchen. aspirin EC 81 MG tablet Take 81 mg by mouth daily.    Marland Kitchen. atorvastatin (LIPITOR) 40  MG tablet Take 40 mg by mouth every evening.   3  . clopidogrel (PLAVIX) 75 MG tablet Take 75 mg by mouth daily.    Marland Kitchen diltiazem (CARDIZEM CD) 240 MG 24 hr capsule Take 1 capsule (240 mg total) by mouth daily. 30 capsule 11  . ferrous sulfate 325 (65 FE) MG tablet Take 325 mg by mouth daily with breakfast.    . furosemide (LASIX) 40 MG tablet Take 1 tablet (40 mg total) by mouth daily. Take 1 additional tablet on Monday, Wednesday, Friday 45 tablet 11  . Insulin Glargine (LANTUS) 100 UNIT/ML Solostar Pen Inject 9 Units into the skin at bedtime. 15 mL   . Melatonin 10 MG TABS Take 1 tablet by mouth at bedtime.    . nitroGLYCERIN (NITROSTAT) 0.4 MG SL tablet Place 1 tablet (0.4 mg total) under the tongue every 5 (five) minutes x 3 doses as needed for chest  pain. 25 tablet 1  . Phenyleph-Chlorphen-Codeine 03-18-09 MG/5ML SYRP Take 5 mLs by mouth every 6 (six) hours as needed. 100 mL 0  . potassium chloride (KLOR-CON 10) 10 MEQ tablet Take 2 tabs with every dose of Lasix. 60 tablet 5  . ranitidine (ZANTAC) 150 MG tablet Take 150 mg by mouth daily.    . vitamin C (ASCORBIC ACID) 500 MG tablet Take 500 mg by mouth 2 (two) times daily.     Related testing: Date of retinal exam: 12/2017  Done in [place] Pneumovax: done  Review of Systems: Pulmonary:  No SOB Cardiovascular:  No chest pain  Objective:  BP 120/70 (BP Location: Left Arm, Patient Position: Sitting, Cuff Size: Normal)   Pulse 79   Temp 98 F (36.7 C) (Oral)   Ht 5\' 2"  (1.575 m)   Wt 175 lb 4 oz (79.5 kg)   SpO2 97%   BMI 32.05 kg/m  General:  Well developed, well nourished, in no apparent distress Skin:  Warm, no pallor or diaphoresis Head:  Normocephalic, atraumatic Eyes:  Pupils equal and round, sclera anicteric without injection  Lungs:  CTAB, no access msc use Cardio:  RRR, no bruits Musculoskeletal:  R sided bunion Neuro:  Sensation intact to pinprick on feet Psych: Age appropriate judgment and insight  Assessment:   Diabetes mellitus type 2 in obese (HCC) - Plan: Hemoglobin A1c, HM Diabetes Foot Exam   Plan:   Orders as above. We will decrease her insulin from 9 units nightly to 4 units nightly.  Continue checking sugars daily.  We will get a microalbumin creatinine ratio at the next visit.  She is unable to leave urine sample today. Counseled on diet and exercise. F/u in 3 mo. The patient and her daughter voiced understanding and agreement to the plan.  [name], DO 04/23/18 9:43 AM

## 2018-04-23 NOTE — Progress Notes (Signed)
Pre visit review using our clinic review tool, if applicable. No additional management support is needed unless otherwise documented below in the visit note. 

## 2018-04-23 NOTE — Patient Instructions (Signed)
4 units nightly of your insulin. Keep checking your sugars daily.  Keep the diet clean.  Let us know if you need anything.

## 2018-04-26 DIAGNOSIS — I2510 Atherosclerotic heart disease of native coronary artery without angina pectoris: Secondary | ICD-10-CM | POA: Diagnosis not present

## 2018-04-26 DIAGNOSIS — R2681 Unsteadiness on feet: Secondary | ICD-10-CM | POA: Diagnosis not present

## 2018-04-26 DIAGNOSIS — E1122 Type 2 diabetes mellitus with diabetic chronic kidney disease: Secondary | ICD-10-CM | POA: Diagnosis not present

## 2018-04-26 DIAGNOSIS — I252 Old myocardial infarction: Secondary | ICD-10-CM | POA: Diagnosis not present

## 2018-04-26 DIAGNOSIS — I1310 Hypertensive heart and chronic kidney disease without heart failure, with stage 1 through stage 4 chronic kidney disease, or unspecified chronic kidney disease: Secondary | ICD-10-CM | POA: Diagnosis not present

## 2018-04-26 DIAGNOSIS — R531 Weakness: Secondary | ICD-10-CM | POA: Diagnosis not present

## 2018-04-28 DIAGNOSIS — I252 Old myocardial infarction: Secondary | ICD-10-CM | POA: Diagnosis not present

## 2018-04-28 DIAGNOSIS — R531 Weakness: Secondary | ICD-10-CM | POA: Diagnosis not present

## 2018-04-28 DIAGNOSIS — R2681 Unsteadiness on feet: Secondary | ICD-10-CM | POA: Diagnosis not present

## 2018-04-28 DIAGNOSIS — I2510 Atherosclerotic heart disease of native coronary artery without angina pectoris: Secondary | ICD-10-CM | POA: Diagnosis not present

## 2018-04-28 DIAGNOSIS — E1122 Type 2 diabetes mellitus with diabetic chronic kidney disease: Secondary | ICD-10-CM | POA: Diagnosis not present

## 2018-04-28 DIAGNOSIS — I1310 Hypertensive heart and chronic kidney disease without heart failure, with stage 1 through stage 4 chronic kidney disease, or unspecified chronic kidney disease: Secondary | ICD-10-CM | POA: Diagnosis not present

## 2018-04-28 NOTE — Progress Notes (Signed)
Cardiology Office Note:    Date:  04/29/2018   ID:  Margaret Ward, DOB [birth year], MRN [id number]  PCP:  Sharlene Dory, DO  Cardiologist:  Norman Herrlich, MD    Referring MD: Sharlene Dory*    ASSESSMENT:    1. Chronic diastolic heart failure (HCC)   2. Hypertensive heart disease with heart failure (HCC)   3. Coronary artery disease of native artery of native heart with stable angina pectoris (HCC)   4. Hyperlipidemia, unspecified hyperlipidemia type    PLAN:    In order of problems listed above:  1. Improved New York Heart Association class I she has no fluid overload we will continue her current diuretic and home self management 2. Stable blood pressure target continue current medical treatment 3. Stable continue current treatment including dual antiplatelet therapy to October at that point I will drop out aspirin with her complaints of bruising. 4. Stable continue statin check lipid profile and CMP   Next appointment: 3 months   Medication Adjustments/Labs and Tests Ordered: Current medicines are reviewed at length with the patient today.  Concerns regarding medicines are outlined above.  No orders of the defined types were placed in this encounter.  No orders of the defined types were placed in this encounter.   Chief Complaint  Patient presents with  . Follow-up    History of Present Illness:    Margaret Ward is a 77 y.o. female with a hx of CAD, CABG and subsequent  NonSTEMI 03/02/18 with PCI and DES of D1 and  bronchospasm and decompensated heart failure EF 40-45% last seen 03/29/18.  ASSESSMENT:    03/29/18   1. Chronic diastolic heart failure (HCC)   2. Coronary artery disease of native artery of native heart with stable angina pectoris (HCC)   3. Hyperlipidemia, unspecified hyperlipidemia type   4. Bronchospasm   5. Hypertensive heart disease with heart failure (HCC)   6. Sinus tachycardia    PLAN:    1. Improved but still  has edema fluid overload and having orthopnea and she will increase her loop diuretic.  I have asked her in 1 week to have follow-up labs including proBNP and renal function. 2. Stable continue medical therapy including dual antiplatelet agent with recent PCI and stent to native coronary vessel 3. Stable continue statin 4. Improved I asked her to use her bronchodilator twice daily 5. Stable continue current treatment 6. Improved continue rate limiting calcium channel blocker  Compliance with diet, lifestyle and medications: Yes  She really is markedly improved she is walking daily has had no angina dyspnea edema palpitations or syncope restrict sodium and her weights have been stable at home.  She feels like she is almost completely recovered from both bypass surgery and PCI.  She does have sternum sternal soreness not severe and understands as a sequela of her bypass surgery. Past Medical History:  Diagnosis Date  . Arthritis    "hands, arms, all over the place" (03/02/2018)  . Chest pain 02/05/2018  . Chronic lower back pain   . GERD (gastroesophageal reflux disease)   . Glass eye    right eye  . Gout    "on daily RX" (03/02/2018)  . Heart disease   . History of kidney stones   . Hyperlipidemia   . Hypertension   . Kidney cysts   . Kidney disease   . Migraine    "used to get them twice/week; haven't had them in a long time" (03/02/2018)  .  NSTEMI (non-ST elevated myocardial infarction) (HCC) 03/01/2018  . On home oxygen therapy    "3L at night and prn during the day" (03/02/2018)  . Pneumonia    used to get it twice/year; stopped after I had hysterectomy" (03/02/2018)  . Sleep apnea   . Type II diabetes mellitus (HCC)     Past Surgical History:  Procedure Laterality Date  . BACK SURGERY    . CARDIAC CATHETERIZATION  12/2017   "before OHS"  . CHOLECYSTECTOMY OPEN  1982  . CORONARY ARTERY BYPASS GRAFT  01/19/2018   "CABG X3; [place]"  . CORONARY STENT INTERVENTION N/A  03/02/2018   Procedure: CORONARY STENT INTERVENTION;  Surgeon: Kathleene Hazel, MD;  [facility];  Service: Cardiovascular;  Laterality: N/A;  . DILATION AND CURETTAGE OF UTERUS    . 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 KNEE ARTHROSCOPY Right X 4  . LEFT HEART CATH AND CORS/GRAFTS ANGIOGRAPHY N/A 03/02/2018   Procedure: LEFT HEART CATH AND CORS/GRAFTS ANGIOGRAPHY;  Surgeon: Kathleene Hazel, MD;  [facility];  Service: Cardiovascular;  Laterality: N/A;  . LUMBAR DISC SURGERY    . REDUCTION MAMMAPLASTY Bilateral X 2  . TONSILLECTOMY  1953  . VAGINAL HYSTERECTOMY  1970    Current Medications: Current Meds  Medication Sig  . acetaminophen (TYLENOL) 500 MG tablet Take 500 mg by mouth every 6 (six) hours as needed for headache.  . albuterol (PROVENTIL HFA;VENTOLIN HFA) 108 (90 Base) MCG/ACT inhaler Inhale 2 puffs into the lungs every 6 (six) hours as needed for wheezing or shortness of breath.  . allopurinol (ZYLOPRIM) 100 MG tablet Take 100 mg by mouth daily.  Marland Kitchen aspirin EC 81 MG tablet Take 81 mg by mouth daily.  Marland Kitchen atorvastatin (LIPITOR) 40 MG tablet Take 40 mg by mouth every evening.   . clopidogrel (PLAVIX) 75 MG tablet Take 75 mg by mouth daily.  Marland Kitchen diltiazem (CARDIZEM CD) 240 MG 24 hr capsule Take 1 capsule (240 mg total) by mouth daily.  . ferrous sulfate 325 (65 FE) MG tablet Take 325 mg by mouth daily with breakfast.  . furosemide (LASIX) 40 MG tablet Take 1 tablet (40 mg total) by mouth daily. Take 1 additional tablet on Monday, Wednesday, Friday  . Insulin Glargine (LANTUS) 100 UNIT/ML Solostar Pen Inject 4 Units into the skin at bedtime.  . Melatonin 10 MG TABS Take 1 tablet by mouth at bedtime.  . nitroGLYCERIN (NITROSTAT) 0.4 MG SL tablet Place 1 tablet (0.4 mg total) under the tongue every 5 (five) minutes x 3 doses as needed for chest pain.  Marland Kitchen Phenyleph-Chlorphen-Codeine 03-18-09 MG/5ML SYRP Take 5 mLs by mouth every 6 (six)  hours as needed.  . potassium chloride (KLOR-CON 10) 10 MEQ tablet Take 2 tabs with every dose of Lasix.  Marland Kitchen ranitidine (ZANTAC) 150 MG tablet Take 150 mg by mouth daily.  . vitamin C (ASCORBIC ACID) 500 MG tablet Take 500 mg by mouth 2 (two) times daily.     Allergies:   Methylprednisolone; Keflex [cephalexin]; Ketorolac; Morphine and related; Novocain [procaine]; and Septra [sulfamethoxazole-trimethoprim]   Social History   Socioeconomic History  . Marital status: Married    Spouse name: Not on file  . Number of children: Not on file  . Years of education: Not on file  . Highest education level: Not on file  Occupational History  . Not on file  Social Needs  . Financial  resource strain: Not on file  . Food insecurity:    Worry: Not on file    Inability: Not on file  . Transportation needs:    Medical: Not on file    Non-medical: Not on file  Tobacco Use  . Smoking status: Former Smoker    Packs/day: 0.12    Years: 3.00    Pack years: 0.36    Types: Cigarettes    Last attempt to quit: 1963    Years since quitting: 56.4  . Smokeless tobacco: Never Used  Substance and Sexual Activity  . Alcohol use: Not Currently    Frequency: Never  . Drug use: Never  . Sexual activity: Not on file  Lifestyle  . Physical activity:    Days per week: Not on file    Minutes per session: Not on file  . Stress: Not on file  Relationships  . Social connections:    Talks on phone: Not on file    Gets together: Not on file    Attends religious service: Not on file    Active member of club or organization: Not on file    Attends meetings of clubs or organizations: Not on file    Relationship status: Not on file  Other Topics Concern  . Not on file  Social History Narrative  . Not on file     Family History: The patient's family history includes Diabetes in her father; Heart Problems in her mother; Heart disease in her brother; Hypertension in her brother, daughter, daughter,  daughter, and daughter; Kidney disease in her brother and sister. ROS:   Please see the history of present illness.    All other systems reviewed and are negative.  EKGs/Labs/Other Studies Reviewed:    The following studies were reviewed today  Recent Labs: 02/09/2018: ALT 13 03/07/2018: Hemoglobin 11.4; Platelets 217 04/06/2018: BUN 24; Creatinine, Ser 1.34; NT-Pro BNP 2,203; Potassium 4.5; Sodium 143  Recent Lipid Panel    Component Value Date/Time   CHOL 84 (L) 02/09/2018 1016   TRIG 90 02/09/2018 1016   HDL 39 (L) 02/09/2018 1016   CHOLHDL 2.2 02/09/2018 1016   LDLCALC 27 02/09/2018 1016    Physical Exam:    VS:  BP 134/62 (BP Location: Right Arm, Patient Position: Sitting, Cuff Size: Normal)   Pulse 78   Ht 5\' 2"  (1.575 m)   Wt 174 lb 12.8 oz (79.3 kg)   SpO2 98%   BMI 31.97 kg/m     Wt Readings from Last 3 Encounters:  04/29/18 174 lb 12.8 oz (79.3 kg)  04/23/18 175 lb 4 oz (79.5 kg)  03/29/18 175 lb 12.8 oz (79.7 kg)     GEN:  Well nourished, well developed in no acute distress HEENT: Normal NECK: No JVD; No carotid bruits LYMPHATICS: No lymphadenopathy CARDIAC: Tenderness along the sternum left costochondral junction that reproduces her complaint RRR, no murmurs, rubs, gallops RESPIRATORY:  Clear to auscultation without rales, wheezing or rhonchi  ABDOMEN: Soft, non-tender, non-distended MUSCULOSKELETAL:  No edema; No deformity  SKIN: Warm and dry NEUROLOGIC:  Alert and oriented x 3 PSYCHIATRIC:  Normal affect    Signed, Norman HerrlichBrian Juston Goheen, MD  04/29/2018 8:32 AM     [facility]

## 2018-04-29 ENCOUNTER — Ambulatory Visit (INDEPENDENT_AMBULATORY_CARE_PROVIDER_SITE_OTHER): Payer: Medicare Other | Admitting: Cardiology

## 2018-04-29 ENCOUNTER — Encounter: Payer: Self-pay | Admitting: Cardiology

## 2018-04-29 VITALS — BP 134/62 | HR 78 | Ht 62.0 in | Wt 174.8 lb

## 2018-04-29 DIAGNOSIS — I25118 Atherosclerotic heart disease of native coronary artery with other forms of angina pectoris: Secondary | ICD-10-CM | POA: Diagnosis not present

## 2018-04-29 DIAGNOSIS — I5032 Chronic diastolic (congestive) heart failure: Secondary | ICD-10-CM | POA: Diagnosis not present

## 2018-04-29 DIAGNOSIS — E785 Hyperlipidemia, unspecified: Secondary | ICD-10-CM | POA: Diagnosis not present

## 2018-04-29 DIAGNOSIS — I110 Hypertensive heart disease with heart failure: Secondary | ICD-10-CM | POA: Diagnosis not present

## 2018-04-29 NOTE — Patient Instructions (Addendum)
Locations to Purchase Compression Stockings:  [facility] [address] [place], [place] [address] [phone] (fax) [email or web address] *Mon-Fri 9am-4:30pm* *Closed on Holidays*  Putting on Compression Stockings Turn the stocking inside-out, then fit it over your toes and heel.  Roll the stocking up your leg.  Once stockings are on, make sure the top of the stocking is about two fingers' width below the crease of the knee (or the groin if you wear thigh-high stockings).  Use equipment, such as a stocking donner, or wear rubber gloves to make it easier to put on compression stockings.   Elastic compression stockings are prescribed to treat many vein problems. Wearing them may be the most important thing you do to manage your symptoms. The stockings fit tightly aroundyour ankle, gradually reducing in pressure as they go up your legs. This helps keep blood flowing toyour heart. As a result, swelling is reduced. Your doctor will prescribe stockings at a safe pressure for you. He or she will also tell you how often to wear and remove the stockings. Follow these instructions closely.Also, do not buy or wear compression stockings without first seeing your doctor. Tips for Wear and Care To wear stockings safely and to get the most benefit:  Wear the length prescribed by your doctor.  Pull them to the designated height and no farther. Don't let them bunch at the top. This can restrict blood flow and increase swelling.  Wear the stockingsfor the amount of time your doctor recommends. Replace them when they start to feel loose. This will likely be every 3 to 6 months.  Remove them as your doctor directs. When removed, wash your legs. Then check your legs and feet for sores. Call your doctor if you find a sore. Don't put the stockings back on unless your doctor directs.  Wash the stockings as instructed. They may need to be handwashed.  Medication  Instructions:  Your physician recommends that you continue on your current medications as directed. Please refer to the Current Medication list given to you today.   Labwork: Your physician recommends that you return for lab work in: CMP, Lipids, BNP, and CBC  Testing/Procedures: None  Follow-Up: Your physician recommends that you schedule a follow-up appointment in: 3 months  Any Other Special Instructions Will Be Listed Below (If Applicable).     If you need a refill on your cardiac medications before your next appointment, please call your pharmacy.

## 2018-04-30 DIAGNOSIS — I1310 Hypertensive heart and chronic kidney disease without heart failure, with stage 1 through stage 4 chronic kidney disease, or unspecified chronic kidney disease: Secondary | ICD-10-CM | POA: Diagnosis not present

## 2018-04-30 DIAGNOSIS — I252 Old myocardial infarction: Secondary | ICD-10-CM | POA: Diagnosis not present

## 2018-04-30 DIAGNOSIS — I2510 Atherosclerotic heart disease of native coronary artery without angina pectoris: Secondary | ICD-10-CM | POA: Diagnosis not present

## 2018-04-30 DIAGNOSIS — R531 Weakness: Secondary | ICD-10-CM | POA: Diagnosis not present

## 2018-04-30 DIAGNOSIS — E1122 Type 2 diabetes mellitus with diabetic chronic kidney disease: Secondary | ICD-10-CM | POA: Diagnosis not present

## 2018-04-30 DIAGNOSIS — R2681 Unsteadiness on feet: Secondary | ICD-10-CM | POA: Diagnosis not present

## 2018-04-30 LAB — LIPID PANEL
Chol/HDL Ratio: 1.8 ratio (ref 0.0–4.4)
Cholesterol, Total: 88 mg/dL — ABNORMAL LOW (ref 100–199)
HDL: 50 mg/dL (ref >39)
LDL Calculated: 23 mg/dL (ref 0–99)
TRIGLYCERIDES: 73 mg/dL (ref 0–149)
VLDL CHOLESTEROL CAL: 15 mg/dL (ref 5–40)

## 2018-04-30 LAB — COMPREHENSIVE METABOLIC PANEL
A/G RATIO: 1.4 (ref 1.2–2.2)
ALT: 14 IU/L (ref 0–32)
AST: 16 IU/L (ref 0–40)
Albumin: 3.8 g/dL (ref 3.5–4.8)
Alkaline Phosphatase: 100 IU/L (ref 39–117)
BILIRUBIN TOTAL: 0.3 mg/dL (ref 0.0–1.2)
BUN/Creatinine Ratio: 22 (ref 12–28)
BUN: 26 mg/dL (ref 8–27)
CALCIUM: 8.8 mg/dL (ref 8.7–10.3)
CHLORIDE: 109 mmol/L — AB (ref 96–106)
CO2: 21 mmol/L (ref 20–29)
Creatinine, Ser: 1.19 mg/dL — ABNORMAL HIGH (ref 0.57–1.00)
GFR calc Af Amer: 51 mL/min/{1.73_m2} — ABNORMAL LOW (ref >59)
GFR, EST NON AFRICAN AMERICAN: 44 mL/min/{1.73_m2} — AB (ref >59)
GLUCOSE: 112 mg/dL — AB (ref 65–99)
Globulin, Total: 2.7 g/dL (ref 1.5–4.5)
POTASSIUM: 3.8 mmol/L (ref 3.5–5.2)
Sodium: 145 mmol/L — ABNORMAL HIGH (ref 134–144)
Total Protein: 6.5 g/dL (ref 6.0–8.5)

## 2018-04-30 LAB — CBC
HEMATOCRIT: 36.4 % (ref 34.0–46.6)
Hemoglobin: 11.4 g/dL (ref 11.1–15.9)
MCH: 29.8 pg (ref 26.6–33.0)
MCHC: 31.3 g/dL — AB (ref 31.5–35.7)
MCV: 95 fL (ref 79–97)
Platelets: 230 10*3/uL (ref 150–450)
RBC: 3.83 x10E6/uL (ref 3.77–5.28)
RDW: 15.2 % (ref 12.3–15.4)
WBC: 6.1 10*3/uL (ref 3.4–10.8)

## 2018-04-30 LAB — PRO B NATRIURETIC PEPTIDE: NT-Pro BNP: 1843 pg/mL — ABNORMAL HIGH (ref 0–738)

## 2018-05-03 DIAGNOSIS — I1310 Hypertensive heart and chronic kidney disease without heart failure, with stage 1 through stage 4 chronic kidney disease, or unspecified chronic kidney disease: Secondary | ICD-10-CM | POA: Diagnosis not present

## 2018-05-03 DIAGNOSIS — R2681 Unsteadiness on feet: Secondary | ICD-10-CM | POA: Diagnosis not present

## 2018-05-03 DIAGNOSIS — I2510 Atherosclerotic heart disease of native coronary artery without angina pectoris: Secondary | ICD-10-CM | POA: Diagnosis not present

## 2018-05-03 DIAGNOSIS — I252 Old myocardial infarction: Secondary | ICD-10-CM | POA: Diagnosis not present

## 2018-05-03 DIAGNOSIS — E1122 Type 2 diabetes mellitus with diabetic chronic kidney disease: Secondary | ICD-10-CM | POA: Diagnosis not present

## 2018-05-03 DIAGNOSIS — R531 Weakness: Secondary | ICD-10-CM | POA: Diagnosis not present

## 2018-05-10 ENCOUNTER — Telehealth: Payer: Self-pay | Admitting: Family Medicine

## 2018-05-10 NOTE — Telephone Encounter (Signed)
Copied from CRM [id number]. Topic: Quick Communication - Rx Refill/Question >> May 10, 2018  1:04 PM Oneal GroutSebastian, Jennifer S wrote: Medication: allopurinol (ZYLOPRIM) 100 MG tablet 90 day supply  Has the patient contacted their pharmacy? Yes.   (Agent: If no, request that the patient contact the pharmacy for the refill.) (Agent: If yes, when and what did the pharmacy advise?)  Preferred Pharmacy (with phone number or street name): [facility] [address] [place]  Agent: Please be advised that RX refills may take up to 3 business days. We ask that you follow-up with your pharmacy.

## 2018-05-11 NOTE — Telephone Encounter (Signed)
Allopurinol refill Last Refilled by historical provider  Last OV: 04/23/18 PCP: Dr. Carmelia RollerWendling [facility] [address] [place]

## 2018-05-12 ENCOUNTER — Telehealth: Payer: Self-pay

## 2018-05-12 DIAGNOSIS — I2510 Atherosclerotic heart disease of native coronary artery without angina pectoris: Secondary | ICD-10-CM | POA: Diagnosis not present

## 2018-05-12 DIAGNOSIS — E1122 Type 2 diabetes mellitus with diabetic chronic kidney disease: Secondary | ICD-10-CM | POA: Diagnosis not present

## 2018-05-12 DIAGNOSIS — R2681 Unsteadiness on feet: Secondary | ICD-10-CM | POA: Diagnosis not present

## 2018-05-12 DIAGNOSIS — I252 Old myocardial infarction: Secondary | ICD-10-CM | POA: Diagnosis not present

## 2018-05-12 DIAGNOSIS — R531 Weakness: Secondary | ICD-10-CM | POA: Diagnosis not present

## 2018-05-12 DIAGNOSIS — I1310 Hypertensive heart and chronic kidney disease without heart failure, with stage 1 through stage 4 chronic kidney disease, or unspecified chronic kidney disease: Secondary | ICD-10-CM | POA: Diagnosis not present

## 2018-05-12 MED ORDER — ALLOPURINOL 100 MG PO TABS: 100.0000 mg | ORAL_TABLET | Freq: Every day | ORAL | 2 refills | Status: DC

## 2018-05-12 NOTE — Telephone Encounter (Signed)
Noted  

## 2018-05-12 NOTE — Telephone Encounter (Signed)
Copied from CRM [id number]. Topic: General - Other >> May 12, 2018 12:13 PM Marylen PontoMcneil, Ja-Kwan wrote: Reason for CRM: [facility] states pt has improved and will be discharged today 05/12/18. Cb# [phone]

## 2018-05-21 ENCOUNTER — Emergency Department (HOSPITAL_COMMUNITY): Payer: Medicare Other

## 2018-05-21 ENCOUNTER — Encounter (HOSPITAL_COMMUNITY): Payer: Self-pay

## 2018-05-21 ENCOUNTER — Inpatient Hospital Stay (HOSPITAL_COMMUNITY)
Admission: EM | Admit: 2018-05-21 | Discharge: 2018-05-27 | DRG: 378 | Disposition: A | Discharge status: Home / Self Care | Payer: Medicare Other | Attending: Internal Medicine | Admitting: Internal Medicine

## 2018-05-21 ENCOUNTER — Other Ambulatory Visit: Payer: Self-pay

## 2018-05-21 DIAGNOSIS — K259 Gastric ulcer, unspecified as acute or chronic, without hemorrhage or perforation: Secondary | ICD-10-CM | POA: Diagnosis present

## 2018-05-21 DIAGNOSIS — Z888 Allergy status to other drugs, medicaments and biological substances status: Secondary | ICD-10-CM

## 2018-05-21 DIAGNOSIS — G4733 Obstructive sleep apnea (adult) (pediatric): Secondary | ICD-10-CM | POA: Diagnosis present

## 2018-05-21 DIAGNOSIS — E1122 Type 2 diabetes mellitus with diabetic chronic kidney disease: Secondary | ICD-10-CM | POA: Diagnosis present

## 2018-05-21 DIAGNOSIS — N179 Acute kidney failure, unspecified: Secondary | ICD-10-CM | POA: Diagnosis present

## 2018-05-21 DIAGNOSIS — E860 Dehydration: Secondary | ICD-10-CM | POA: Diagnosis not present

## 2018-05-21 DIAGNOSIS — I5042 Chronic combined systolic (congestive) and diastolic (congestive) heart failure: Secondary | ICD-10-CM | POA: Diagnosis not present

## 2018-05-21 DIAGNOSIS — I700 Atherosclerosis of aorta: Secondary | ICD-10-CM | POA: Diagnosis present

## 2018-05-21 DIAGNOSIS — G8929 Other chronic pain: Secondary | ICD-10-CM | POA: Diagnosis present

## 2018-05-21 DIAGNOSIS — D649 Anemia, unspecified: Secondary | ICD-10-CM | POA: Diagnosis not present

## 2018-05-21 DIAGNOSIS — I130 Hypertensive heart and chronic kidney disease with heart failure and stage 1 through stage 4 chronic kidney disease, or unspecified chronic kidney disease: Secondary | ICD-10-CM | POA: Diagnosis present

## 2018-05-21 DIAGNOSIS — K2211 Ulcer of esophagus with bleeding: Secondary | ICD-10-CM | POA: Diagnosis present

## 2018-05-21 DIAGNOSIS — I519 Heart disease, unspecified: Secondary | ICD-10-CM | POA: Diagnosis not present

## 2018-05-21 DIAGNOSIS — K210 Gastro-esophageal reflux disease with esophagitis: Secondary | ICD-10-CM | POA: Diagnosis present

## 2018-05-21 DIAGNOSIS — Z794 Long term (current) use of insulin: Secondary | ICD-10-CM

## 2018-05-21 DIAGNOSIS — E119 Type 2 diabetes mellitus without complications: Secondary | ICD-10-CM | POA: Insufficient documentation

## 2018-05-21 DIAGNOSIS — Z951 Presence of aortocoronary bypass graft: Secondary | ICD-10-CM

## 2018-05-21 DIAGNOSIS — I25708 Atherosclerosis of coronary artery bypass graft(s), unspecified, with other forms of angina pectoris: Secondary | ICD-10-CM | POA: Diagnosis not present

## 2018-05-21 DIAGNOSIS — E785 Hyperlipidemia, unspecified: Secondary | ICD-10-CM | POA: Diagnosis not present

## 2018-05-21 DIAGNOSIS — R1030 Lower abdominal pain, unspecified: Secondary | ICD-10-CM | POA: Diagnosis not present

## 2018-05-21 DIAGNOSIS — J9611 Chronic respiratory failure with hypoxia: Secondary | ICD-10-CM | POA: Diagnosis present

## 2018-05-21 DIAGNOSIS — N281 Cyst of kidney, acquired: Secondary | ICD-10-CM | POA: Diagnosis not present

## 2018-05-21 DIAGNOSIS — Z882 Allergy status to sulfonamides status: Secondary | ICD-10-CM

## 2018-05-21 DIAGNOSIS — K449 Diaphragmatic hernia without obstruction or gangrene: Secondary | ICD-10-CM | POA: Diagnosis present

## 2018-05-21 DIAGNOSIS — K219 Gastro-esophageal reflux disease without esophagitis: Secondary | ICD-10-CM | POA: Diagnosis not present

## 2018-05-21 DIAGNOSIS — I959 Hypotension, unspecified: Secondary | ICD-10-CM | POA: Diagnosis present

## 2018-05-21 DIAGNOSIS — Z955 Presence of coronary angioplasty implant and graft: Secondary | ICD-10-CM

## 2018-05-21 DIAGNOSIS — N183 Chronic kidney disease, stage 3 unspecified: Secondary | ICD-10-CM | POA: Diagnosis present

## 2018-05-21 DIAGNOSIS — Z87891 Personal history of nicotine dependence: Secondary | ICD-10-CM

## 2018-05-21 DIAGNOSIS — K253 Acute gastric ulcer without hemorrhage or perforation: Secondary | ICD-10-CM | POA: Diagnosis present

## 2018-05-21 DIAGNOSIS — K921 Melena: Secondary | ICD-10-CM | POA: Diagnosis not present

## 2018-05-21 DIAGNOSIS — I249 Acute ischemic heart disease, unspecified: Secondary | ICD-10-CM | POA: Diagnosis not present

## 2018-05-21 DIAGNOSIS — R000 Tachycardia, unspecified: Secondary | ICD-10-CM | POA: Diagnosis not present

## 2018-05-21 DIAGNOSIS — I25810 Atherosclerosis of coronary artery bypass graft(s) without angina pectoris: Secondary | ICD-10-CM | POA: Diagnosis not present

## 2018-05-21 DIAGNOSIS — M545 Low back pain: Secondary | ICD-10-CM

## 2018-05-21 DIAGNOSIS — I252 Old myocardial infarction: Secondary | ICD-10-CM

## 2018-05-21 DIAGNOSIS — I25119 Atherosclerotic heart disease of native coronary artery with unspecified angina pectoris: Secondary | ICD-10-CM | POA: Diagnosis present

## 2018-05-21 DIAGNOSIS — Z885 Allergy status to narcotic agent status: Secondary | ICD-10-CM

## 2018-05-21 DIAGNOSIS — J9811 Atelectasis: Secondary | ICD-10-CM | POA: Diagnosis not present

## 2018-05-21 DIAGNOSIS — G43909 Migraine, unspecified, not intractable, without status migrainosus: Secondary | ICD-10-CM | POA: Diagnosis present

## 2018-05-21 DIAGNOSIS — I10 Essential (primary) hypertension: Secondary | ICD-10-CM | POA: Diagnosis not present

## 2018-05-21 DIAGNOSIS — M109 Gout, unspecified: Secondary | ICD-10-CM | POA: Diagnosis present

## 2018-05-21 DIAGNOSIS — G4730 Sleep apnea, unspecified: Secondary | ICD-10-CM | POA: Diagnosis present

## 2018-05-21 DIAGNOSIS — K319 Disease of stomach and duodenum, unspecified: Secondary | ICD-10-CM | POA: Diagnosis present

## 2018-05-21 DIAGNOSIS — Z9981 Dependence on supplemental oxygen: Secondary | ICD-10-CM

## 2018-05-21 DIAGNOSIS — N200 Calculus of kidney: Secondary | ICD-10-CM | POA: Diagnosis present

## 2018-05-21 DIAGNOSIS — K254 Chronic or unspecified gastric ulcer with hemorrhage: Secondary | ICD-10-CM | POA: Diagnosis present

## 2018-05-21 DIAGNOSIS — R197 Diarrhea, unspecified: Secondary | ICD-10-CM | POA: Diagnosis not present

## 2018-05-21 DIAGNOSIS — Z87442 Personal history of urinary calculi: Secondary | ICD-10-CM

## 2018-05-21 DIAGNOSIS — Z9049 Acquired absence of other specified parts of digestive tract: Secondary | ICD-10-CM

## 2018-05-21 DIAGNOSIS — Z841 Family history of disorders of kidney and ureter: Secondary | ICD-10-CM

## 2018-05-21 DIAGNOSIS — Z7189 Other specified counseling: Secondary | ICD-10-CM | POA: Diagnosis not present

## 2018-05-21 DIAGNOSIS — Z8249 Family history of ischemic heart disease and other diseases of the circulatory system: Secondary | ICD-10-CM

## 2018-05-21 DIAGNOSIS — D509 Iron deficiency anemia, unspecified: Secondary | ICD-10-CM | POA: Diagnosis not present

## 2018-05-21 DIAGNOSIS — I2510 Atherosclerotic heart disease of native coronary artery without angina pectoris: Secondary | ICD-10-CM

## 2018-05-21 DIAGNOSIS — Z7982 Long term (current) use of aspirin: Secondary | ICD-10-CM

## 2018-05-21 DIAGNOSIS — Z90710 Acquired absence of both cervix and uterus: Secondary | ICD-10-CM

## 2018-05-21 DIAGNOSIS — D62 Acute posthemorrhagic anemia: Secondary | ICD-10-CM | POA: Diagnosis present

## 2018-05-21 DIAGNOSIS — K3189 Other diseases of stomach and duodenum: Secondary | ICD-10-CM | POA: Diagnosis not present

## 2018-05-21 DIAGNOSIS — R0602 Shortness of breath: Secondary | ICD-10-CM | POA: Diagnosis not present

## 2018-05-21 DIAGNOSIS — Z833 Family history of diabetes mellitus: Secondary | ICD-10-CM

## 2018-05-21 DIAGNOSIS — M159 Polyosteoarthritis, unspecified: Secondary | ICD-10-CM | POA: Diagnosis present

## 2018-05-21 DIAGNOSIS — K922 Gastrointestinal hemorrhage, unspecified: Secondary | ICD-10-CM | POA: Diagnosis not present

## 2018-05-21 DIAGNOSIS — R195 Other fecal abnormalities: Secondary | ICD-10-CM | POA: Diagnosis not present

## 2018-05-21 DIAGNOSIS — K2951 Unspecified chronic gastritis with bleeding: Secondary | ICD-10-CM | POA: Diagnosis not present

## 2018-05-21 DIAGNOSIS — R1084 Generalized abdominal pain: Secondary | ICD-10-CM | POA: Diagnosis not present

## 2018-05-21 HISTORY — DX: Gastro-esophageal reflux disease without esophagitis: K21.9

## 2018-05-21 HISTORY — DX: Anemia, unspecified: D64.9

## 2018-05-21 HISTORY — DX: Essential (primary) hypertension: I10

## 2018-05-21 HISTORY — DX: Hypertensive heart disease without heart failure: I11.9

## 2018-05-21 HISTORY — DX: Chronic kidney disease, stage 3 (moderate): N18.3

## 2018-05-21 HISTORY — DX: Chronic respiratory failure with hypoxia: J96.11

## 2018-05-21 HISTORY — DX: Heart disease, unspecified: I51.9

## 2018-05-21 HISTORY — DX: Other fecal abnormalities: R19.5

## 2018-05-21 HISTORY — DX: Aneurysm of heart: I25.3

## 2018-05-21 HISTORY — DX: Chronic combined systolic (congestive) and diastolic (congestive) heart failure: I50.42

## 2018-05-21 HISTORY — DX: Hyperlipidemia, unspecified: E78.5

## 2018-05-21 HISTORY — DX: Chronic kidney disease, stage 3 unspecified: N18.30

## 2018-05-21 HISTORY — DX: Type 2 diabetes mellitus without complications: E11.9

## 2018-05-21 HISTORY — DX: Atherosclerotic heart disease of native coronary artery without angina pectoris: I25.10

## 2018-05-21 LAB — COMPREHENSIVE METABOLIC PANEL
ALBUMIN: 2.8 g/dL — AB (ref 3.5–5.0)
ALK PHOS: 71 U/L (ref 38–126)
ALT: 17 U/L (ref 0–44)
ANION GAP: 11 (ref 5–15)
AST: 18 U/L (ref 15–41)
BUN: 88 mg/dL — ABNORMAL HIGH (ref 8–23)
CALCIUM: 8.2 mg/dL — AB (ref 8.9–10.3)
CO2: 23 mmol/L (ref 22–32)
Chloride: 107 mmol/L (ref 98–111)
Creatinine, Ser: 1.49 mg/dL — ABNORMAL HIGH (ref 0.44–1.00)
GFR calc Af Amer: 38 mL/min — ABNORMAL LOW (ref >60)
GFR calc non Af Amer: 33 mL/min — ABNORMAL LOW (ref >60)
GLUCOSE: 221 mg/dL — AB (ref 70–99)
POTASSIUM: 4.8 mmol/L (ref 3.5–5.1)
SODIUM: 141 mmol/L (ref 135–145)
Total Bilirubin: 0.5 mg/dL (ref 0.3–1.2)
Total Protein: 5.4 g/dL — ABNORMAL LOW (ref 6.5–8.1)

## 2018-05-21 LAB — CBC WITH DIFFERENTIAL/PLATELET
ABS IMMATURE GRANULOCYTES: 0.1 10*3/uL (ref 0.0–0.1)
BASOS ABS: 0.1 10*3/uL (ref 0.0–0.1)
Basophils Relative: 1 %
Eosinophils Absolute: 0.1 10*3/uL (ref 0.0–0.7)
Eosinophils Relative: 0 %
HCT: 30.4 % — ABNORMAL LOW (ref 36.0–46.0)
HEMOGLOBIN: 9.2 g/dL — AB (ref 12.0–15.0)
Immature Granulocytes: 0 %
LYMPHS ABS: 1.7 10*3/uL (ref 0.7–4.0)
LYMPHS PCT: 12 %
MCH: 29.9 pg (ref 26.0–34.0)
MCHC: 30.3 g/dL (ref 30.0–36.0)
MCV: 98.7 fL (ref 78.0–100.0)
MONOS PCT: 4 %
Monocytes Absolute: 0.6 10*3/uL (ref 0.1–1.0)
NEUTROS ABS: 11.7 10*3/uL — AB (ref 1.7–7.7)
Neutrophils Relative %: 83 %
Platelets: 193 10*3/uL (ref 150–400)
RBC: 3.08 MIL/uL — ABNORMAL LOW (ref 3.87–5.11)
RDW: 14.2 % (ref 11.5–15.5)
WBC: 14.2 10*3/uL — ABNORMAL HIGH (ref 4.0–10.5)

## 2018-05-21 LAB — CBC
HEMATOCRIT: 26.3 % — AB (ref 36.0–46.0)
HEMOGLOBIN: 8 g/dL — AB (ref 12.0–15.0)
MCH: 30.1 pg (ref 26.0–34.0)
MCHC: 30.4 g/dL (ref 30.0–36.0)
MCV: 98.9 fL (ref 78.0–100.0)
Platelets: 175 10*3/uL (ref 150–400)
RBC: 2.66 MIL/uL — AB (ref 3.87–5.11)
RDW: 14.6 % (ref 11.5–15.5)
WBC: 9.5 10*3/uL (ref 4.0–10.5)

## 2018-05-21 LAB — GLUCOSE, CAPILLARY
GLUCOSE-CAPILLARY: 160 mg/dL — AB (ref 70–99)
GLUCOSE-CAPILLARY: 176 mg/dL — AB (ref 70–99)

## 2018-05-21 LAB — I-STAT TROPONIN, ED: TROPONIN I, POC: 0 ng/mL (ref 0.00–0.08)

## 2018-05-21 LAB — LIPASE, BLOOD: Lipase: 42 U/L (ref 11–51)

## 2018-05-21 LAB — ABO/RH: ABO/RH(D): AB POS

## 2018-05-21 LAB — BRAIN NATRIURETIC PEPTIDE: B Natriuretic Peptide: 68.8 pg/mL (ref 0.0–100.0)

## 2018-05-21 LAB — HEMOGLOBIN A1C
Hgb A1c MFr Bld: 6.3 % — ABNORMAL HIGH (ref 4.8–5.6)
Mean Plasma Glucose: 134.11 mg/dL

## 2018-05-21 LAB — PREPARE RBC (CROSSMATCH)

## 2018-05-21 LAB — TROPONIN I: Troponin I: <0.03 ng/mL (ref <0.03)

## 2018-05-21 LAB — POC OCCULT BLOOD, ED: Fecal Occult Bld: POSITIVE — AB

## 2018-05-21 MED ORDER — ONDANSETRON HCL 4 MG/2ML IJ SOLN: 4.0000 mg | Freq: Four times a day (QID) | INTRAMUSCULAR | Status: DC | PRN

## 2018-05-21 MED ORDER — SODIUM CHLORIDE 0.9% IV SOLUTION
Freq: Once | INTRAVENOUS | Status: AC
  Administered 2018-05-21: 20:00:00 via INTRAVENOUS

## 2018-05-21 MED ORDER — FAMOTIDINE 20 MG PO TABS: 20.0000 mg | ORAL_TABLET | Freq: Every day | ORAL | Status: DC

## 2018-05-21 MED ORDER — INSULIN ASPART 100 UNIT/ML ~~LOC~~ SOLN: 0.0000 [IU] | Freq: Every day | SUBCUTANEOUS | Status: DC

## 2018-05-21 MED ORDER — ALLOPURINOL 100 MG PO TABS
100.0000 mg | ORAL_TABLET | Freq: Every day | ORAL | Status: DC
  Administered 2018-05-21 – 2018-05-27 (×7): 100 mg via ORAL
  Filled 2018-05-21 (×7): qty 1

## 2018-05-21 MED ORDER — ONDANSETRON HCL 4 MG PO TABS: 4.0000 mg | ORAL_TABLET | Freq: Four times a day (QID) | ORAL | Status: DC | PRN

## 2018-05-21 MED ORDER — PANTOPRAZOLE SODIUM 40 MG PO TBEC: 40.0000 mg | DELAYED_RELEASE_TABLET | Freq: Every day | ORAL | Status: DC

## 2018-05-21 MED ORDER — PANTOPRAZOLE SODIUM 40 MG IV SOLR
40.0000 mg | Freq: Two times a day (BID) | INTRAVENOUS | Status: DC
  Filled 2018-05-21: qty 40

## 2018-05-21 MED ORDER — IOHEXOL 300 MG/ML  SOLN
80.0000 mL | Freq: Once | INTRAMUSCULAR | Status: AC | PRN
  Administered 2018-05-21: 80 mL via INTRAVENOUS

## 2018-05-21 MED ORDER — CLOPIDOGREL BISULFATE 75 MG PO TABS
75.0000 mg | ORAL_TABLET | Freq: Every day | ORAL | Status: DC
  Administered 2018-05-21 – 2018-05-25 (×5): 75 mg via ORAL
  Filled 2018-05-21 (×5): qty 1

## 2018-05-21 MED ORDER — ACETAMINOPHEN 325 MG PO TABS
650.0000 mg | ORAL_TABLET | Freq: Four times a day (QID) | ORAL | Status: DC | PRN
  Administered 2018-05-23 – 2018-05-26 (×7): 650 mg via ORAL
  Filled 2018-05-21 (×7): qty 2

## 2018-05-21 MED ORDER — ACETAMINOPHEN 650 MG RE SUPP: 650.0000 mg | Freq: Four times a day (QID) | RECTAL | Status: DC | PRN

## 2018-05-21 MED ORDER — INSULIN ASPART 100 UNIT/ML ~~LOC~~ SOLN
0.0000 [IU] | Freq: Three times a day (TID) | SUBCUTANEOUS | Status: DC
  Administered 2018-05-21 – 2018-05-23 (×5): 3 [IU] via SUBCUTANEOUS
  Administered 2018-05-24: 5 [IU] via SUBCUTANEOUS
  Administered 2018-05-24 – 2018-05-25 (×3): 2 [IU] via SUBCUTANEOUS
  Administered 2018-05-25 – 2018-05-26 (×2): 5 [IU] via SUBCUTANEOUS
  Administered 2018-05-26: 2 [IU] via SUBCUTANEOUS
  Administered 2018-05-27: 5 [IU] via SUBCUTANEOUS

## 2018-05-21 MED ORDER — PANTOPRAZOLE SODIUM 40 MG IV SOLR
40.0000 mg | Freq: Two times a day (BID) | INTRAVENOUS | Status: DC
  Administered 2018-05-21 – 2018-05-23 (×4): 40 mg via INTRAVENOUS
  Filled 2018-05-21 (×6): qty 40

## 2018-05-21 MED ORDER — ALBUTEROL SULFATE (2.5 MG/3ML) 0.083% IN NEBU: 2.5000 mg | INHALATION_SOLUTION | Freq: Four times a day (QID) | RESPIRATORY_TRACT | Status: DC | PRN

## 2018-05-21 MED ORDER — ATORVASTATIN CALCIUM 40 MG PO TABS
40.0000 mg | ORAL_TABLET | Freq: Every evening | ORAL | Status: DC
  Administered 2018-05-21 – 2018-05-26 (×6): 40 mg via ORAL
  Filled 2018-05-21 (×7): qty 1

## 2018-05-21 MED ORDER — SODIUM CHLORIDE 0.9 % IV SOLN
INTRAVENOUS | Status: DC
  Administered 2018-05-21: 17:00:00 via INTRAVENOUS

## 2018-05-21 MED ORDER — SODIUM CHLORIDE 0.9 % IV BOLUS
500.0000 mL | Freq: Once | INTRAVENOUS | Status: AC
  Administered 2018-05-21: 500 mL via INTRAVENOUS

## 2018-05-21 MED ORDER — SODIUM CHLORIDE 0.9% FLUSH
3.0000 mL | Freq: Two times a day (BID) | INTRAVENOUS | Status: DC
  Administered 2018-05-21 – 2018-05-24 (×4): 3 mL via INTRAVENOUS

## 2018-05-21 NOTE — ED Notes (Addendum)
Pt stood up in room and became very tacky and SOB. Sats notes to remain in upper 90s although pt had noticeable SOB.  EDP made aware.   Resting Pulse: 115bpm Resting Sats: 96% RA  Standing Pulse: 140bpm Standing Sats: 99% RA

## 2018-05-21 NOTE — ED Triage Notes (Signed)
Pt from home via ems; C/O SOB starting this am; 100% on  2L, wears all the time; diarrhea x3 this am; bilateral lower abd pain; describes stool as being black, takes iron pill at home; bypass in march, stent in July; slightly tachycardic, [place] [facility], occasional pvcs; no cp, no nausea; pt says she is compliant w/ meds, but has not taken any this am; edema R leg which is normal for her, has had several knee sx; pt states that she weighs herself daily, no weight gain  BP 110/67 HR 104 100% 3L CBG 273

## 2018-05-21 NOTE — Progress Notes (Signed)
Pt showing HR at 140's per telemetery. Pt checked, she's using the bathroom. Pt denies any dizziness or discomfort.  Will monitor pt.

## 2018-05-21 NOTE — ED Notes (Signed)
Patient transported to X-ray 

## 2018-05-21 NOTE — ED Notes (Signed)
Pt care assumed obtained verbal report.  Pt is resting and appears comfortable.  Family is at bedside.  She denies any complaints.

## 2018-05-21 NOTE — H&P (Addendum)
History and Physical    Margaret Ward JXB:[id number] DOB: 03-09-1941 DOA: 05/21/2018  **Will admit patient based on the expectation that the patient will need hospitalization/ hospital care that crosses at least 2 midnights  PCP: Sharlene Dory, DO   Attending physician: Elvera Lennox  Patient coming from/Resides with: Private residence  Chief Complaint: RLQ abdominal pain, shortness of breath, large volume black diarrhea  HPI: Margaret Ward is a 77 y.o. female with medical history significant for CAD with CABG x3 in March 2019 with subsequent DES to vein graft April 2019 on Plavix, hypertension, sleep apnea on chronic O2 for multiple years, GERD, diabetes on insulin, dyslipidemia, chronic combined systolic and diastolic heart failure.  Patient reports that beginning yesterday she had vague abdominal pain that worsened by this morning and was subsequently followed by 3 large volume diarrheal stools that were black.  Patient does take iron but does not recall her stools being discolored or having this same odor.  She has not noticed any frank red blood.  She has chronic RLE edema.  In the ER she has not been hypoxemic but because of shortness of breath 2 L oxygen was applied.  With standing the patient was noted to have tachycardia with heart rate changing from 115 bpm with room air saturation 96% to 140 bpm upon standing with saturation 99%.  Have returned with elevated BUN of 88, hemoglobin down to 9.2 from a baseline of 11.4 and stools were heme positive.  On exam patient had reproducible RLQ pain on palpation.  CT the abdomen and pelvis did not explain patient's abdominal pain.  ED Course:  Vital Signs: BP 119/62   Pulse (!) 116   Temp 98.9 F (37.2 C)   Resp (!) 21   Ht 5\' 2"  (1.575 m)   Wt 78.5 kg (173 lb)   SpO2 100%   BMI 31.64 kg/m  CXR: Neg CT abdomen and pelvis: Unremarkable regards to explaining patient's abdominal pain and heme positive stools except for findings of  bilateral polycystic kidneys, large left-sided staghorn calculus, large hiatal hernia Lab data: Sodium 141, potassium 4.8, chloride 107, CO2 23, glucose 221, BUN 88, creatinine 1.49, anion gap 11, albumin 2.8, LFTs not elevated, BNP normal, initial point-of-care troponin normal, white count 14,200 with neutrophils 83% dilute neutrophils 11.7%, hemoglobin 9.2, platelets 193,000, FOB + Medications and treatments: NS bolus 500 cc x 1  Review of Systems:  In addition to the HPI above,  No Fever-chills, myalgias or other constitutional symptoms No Headache, changes with Vision or hearing, new weakness, tingling, numbness in any extremity, dizziness, dysarthria or word finding difficulty, gait disturbance or imbalance, tremors or seizure activity No problems swallowing food or Liquids, indigestion/reflux, choking or coughing while eating, abdominal pain with or after eating No Chest pain, Cough or Shortness of Breath, palpitations, orthopnea or DOE No N/V, hematochezia, constipation No dysuria, malodorous urine, hematuria or flank pain No new skin rashes, lesions, masses or bruises, No new joint pains, aches, swelling or redness No recent unintentional weight gain or loss No polyuria, polydypsia or polyphagia   Past Medical History:  Diagnosis Date  . Arthritis    "hands, arms, all over the place" (03/02/2018)  . Chest pain 02/05/2018  . Chronic lower back pain   . GERD (gastroesophageal reflux disease)   . Glass eye    right eye  . Gout    "on daily RX" (03/02/2018)  . Heart disease   . History of kidney stones   .  Hyperlipidemia   . Hypertension   . Kidney cysts   . Kidney disease   . Migraine    "used to get them twice/week; haven't had them in a long time" (03/02/2018)  . NSTEMI (non-ST elevated myocardial infarction) (HCC) 03/01/2018  . On home oxygen therapy    "3L at night and prn during the day" (03/02/2018)  . Pneumonia    used to get it twice/year; stopped after I had  hysterectomy" (03/02/2018)  . Sleep apnea   . Type II diabetes mellitus (HCC)     Past Surgical History:  Procedure Laterality Date  . BACK SURGERY    . CARDIAC CATHETERIZATION  12/2017   "before OHS"  . CHOLECYSTECTOMY OPEN  1982  . CORONARY ARTERY BYPASS GRAFT  01/19/2018   [place]  . CORONARY STENT INTERVENTION N/A 03/02/2018   Procedure: CORONARY STENT INTERVENTION;  Surgeon: Kathleene HazelMcAlhany, Christopher D, MD;  Location: [facility];  Service: Cardiovascular;  Laterality: N/A;  . DILATION AND CURETTAGE OF UTERUS    . 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 KNEE ARTHROSCOPY Right X 4  . LEFT HEART CATH AND CORS/GRAFTS ANGIOGRAPHY N/A 03/02/2018   Procedure: LEFT HEART CATH AND CORS/GRAFTS ANGIOGRAPHY;  Surgeon: Kathleene HazelMcAlhany, Christopher D, MD;  Location: [facility];  Service: Cardiovascular;  Laterality: N/A;  . LUMBAR DISC SURGERY    . REDUCTION MAMMAPLASTY Bilateral X 2  . TONSILLECTOMY  1953  . VAGINAL HYSTERECTOMY  1970    Social History   Socioeconomic History  . Marital status: Married    Spouse name: Not on file  . Number of children: Not on file  . Years of education: Not on file  . Highest education level: Not on file  Occupational History  . Not on file  Social Needs  . Financial resource strain: Not on file  . Food insecurity:    Worry: Not on file    Inability: Not on file  . Transportation needs:    Medical: Not on file    Non-medical: Not on file  Tobacco Use  . Smoking status: Former Smoker    Packs/day: 0.12    Years: 3.00    Pack years: 0.36    Types: Cigarettes    Last attempt to quit: 1963    Years since quitting: 56.5  . Smokeless tobacco: Never Used  Substance and Sexual Activity  . Alcohol use: Not Currently    Frequency: Never  . Drug use: Never  . Sexual activity: Not on file  Lifestyle  . Physical activity:    Days per week: Not on file    Minutes per session: Not on file  . Stress: Not on file    Relationships  . Social connections:    Talks on phone: Not on file    Gets together: Not on file    Attends religious service: Not on file    Active member of club or organization: Not on file    Attends meetings of clubs or organizations: Not on file    Relationship status: Not on file  . Intimate partner violence:    Fear of current or ex partner: Not on file    Emotionally abused: Not on file    Physically abused: Not on file    Forced sexual activity: Not on file  Other Topics Concern  . Not on file  Social History Narrative  . Not on file  Mobility: Utilizes a cane Work history: Not obtained   Allergies  Allergen Reactions  . Methylprednisolone     Severe cramps legs and arms  . Keflex [Cephalexin] Hives  . Ketorolac Swelling  . Morphine And Related     Itching   . Novocain [Procaine] Swelling  . Septra [Sulfamethoxazole-Trimethoprim] Nausea And Vomiting    Family History  Problem Relation Age of Onset  . Heart Problems Mother   . Diabetes Father   . Kidney disease Sister   . Heart disease Brother   . Hypertension Brother   . Kidney disease Brother   . Hypertension Daughter   . Hypertension Daughter   . Hypertension Daughter   . Hypertension Daughter     Prior to Admission medications   Medication Sig Start Date End Date Taking? Authorizing Provider  acetaminophen (TYLENOL) 500 MG tablet Take 500 mg by mouth every 6 (six) hours as needed for headache.   Yes [provider]  albuterol (PROVENTIL HFA;VENTOLIN HFA) 108 (90 Base) MCG/ACT inhaler Inhale 2 puffs into the lungs every 6 (six) hours as needed for wheezing or shortness of breath. 03/09/18  Yes Baldo Daub, MD  allopurinol (ZYLOPRIM) 100 MG tablet Take 1 tablet (100 mg total) by mouth daily. 05/12/18  Yes Sharlene Dory, DO  aspirin EC 81 MG tablet Take 81 mg by mouth daily.   Yes [provider]  atorvastatin (LIPITOR) 40 MG tablet Take 40 mg by mouth every evening.   02/16/18  Yes [provider]  clopidogrel (PLAVIX) 75 MG tablet Take 75 mg by mouth daily.   Yes [provider]  diltiazem (CARDIZEM CD) 240 MG 24 hr capsule Take 1 capsule (240 mg total) by mouth daily. 03/09/18 06/07/18 Yes Baldo Daub, MD  ferrous sulfate 325 (65 FE) MG tablet Take 325 mg by mouth daily with breakfast.   Yes [provider]  furosemide (LASIX) 40 MG tablet Take 1 tablet (40 mg total) by mouth daily. Take 1 additional tablet on Monday, Wednesday, Friday Patient taking differently: Take 40-80 mg by mouth See admin instructions. Take 1 tablet daily, and 1 additional tablet on Monday, Wednesday, Friday (take additional tablet at 3PM) 03/29/18 06/27/18 Yes Munley, Iline Oven, MD  Insulin Glargine (LANTUS) 100 UNIT/ML Solostar Pen Inject 4 Units into the skin at bedtime. 04/23/18  Yes Sharlene Dory, DO  Melatonin 10 MG TABS Take 10 mg by mouth at bedtime.    Yes [provider]  nitroGLYCERIN (NITROSTAT) 0.4 MG SL tablet Place 1 tablet (0.4 mg total) under the tongue every 5 (five) minutes x 3 doses as needed for chest pain. 03/03/18  Yes Duke, Roe Rutherford, PA  Phenyleph-Chlorphen-Codeine 03-18-09 MG/5ML SYRP Take 5 mLs by mouth every 6 (six) hours as needed. Patient taking differently: Take 5 mLs by mouth every 6 (six) hours as needed (cough).  03/08/18  Yes Upstill, Melvenia Beam, PA-C  potassium chloride (KLOR-CON 10) 10 MEQ tablet Take 2 tabs with every dose of Lasix. 03/12/18  Yes Sharlene Dory, DO  ranitidine (ZANTAC) 150 MG tablet Take 150 mg by mouth daily.   Yes [provider]  vitamin C (ASCORBIC ACID) 500 MG tablet Take 500 mg by mouth 2 (two) times daily.   Yes [provider]    Physical Exam: Vitals:   05/21/18 1015 05/21/18 1100 05/21/18 1145 05/21/18 1200  BP:  92/68 104/60 119/62  Pulse: 96 (!) 102 98 (!) 116  Resp: 14 (!) 23 (!)  27 (!) 21  Temp:      SpO2: 100% 100% 100% 100%  Weight:      Height:            Constitutional: NAD, calm, comfortable-pale in appearance Eyes: PERRL, lids normal, type of pale ENMT: Mucous membranes are moist. Posterior pharynx clear of any exudate or lesions.Normal dentition.  Neck: normal, supple, no masses, no thyromegaly Respiratory: clear to auscultation bilaterally, no wheezing, no crackles. Normal respiratory effort. No accessory muscle use.  Cardiovascular: Regular tachycardic rate and rhythm, no murmurs / rubs / gallops. No extremity edema. 2+ pedal pulses. No carotid bruits.  Abdomen: RLQ tenderness with slight guarding but no rebounding, no masses palpated. No hepatosplenomegaly. Bowel sounds positive.  Musculoskeletal: no clubbing / cyanosis. No joint deformity upper and lower extremities. Good ROM, no contractures. Normal muscle tone.  Skin: no rashes, lesions, ulcers. No induration Neurologic: CN 2-12 grossly intact. Sensation intact, DTR normal. Strength 5/5 x all 4 extremities.  Psychiatric: Normal judgment and insight. Alert and oriented x 3. Normal mood.    Labs on Admission: I have personally reviewed following labs and imaging studies  CBC: Recent Labs  Lab 05/21/18 0958  WBC 14.2*  NEUTROABS 11.7*  HGB 9.2*  HCT 30.4*  MCV 98.7  PLT 193   Basic Metabolic Panel: Recent Labs  Lab 05/21/18 0958  NA 141  K 4.8  CL 107  CO2 23  GLUCOSE 221*  BUN 88*  CREATININE 1.49*  CALCIUM 8.2*   GFR: Estimated Creatinine Clearance: 30.7 mL/min (A) (by C-G formula based on SCr of 1.49 mg/dL (H)). Liver Function Tests: Recent Labs  Lab 05/21/18 0958  AST 18  ALT 17  ALKPHOS 71  BILITOT 0.5  PROT 5.4*  ALBUMIN 2.8*   Recent Labs  Lab 05/21/18 0958  LIPASE 42   No results for input(s): AMMONIA in the last 168 hours. Coagulation Profile: No results for input(s): INR, PROTIME in the last 168 hours. Cardiac Enzymes: No results for input(s): CKTOTAL, CKMB, CKMBINDEX, TROPONINI in the last 168 hours. BNP (last 3 results) Recent  Labs    03/09/18 0910 04/06/18 0940 04/29/18 0905  PROBNP 4,452* 2,203* 1,843*   HbA1C: No results for input(s): HGBA1C in the last 72 hours. CBG: No results for input(s): GLUCAP in the last 168 hours. Lipid Profile: No results for input(s): CHOL, HDL, LDLCALC, TRIG, CHOLHDL, LDLDIRECT in the last 72 hours. Thyroid Function Tests: No results for input(s): TSH, T4TOTAL, FREET4, T3FREE, THYROIDAB in the last 72 hours. Anemia Panel: No results for input(s): VITAMINB12, FOLATE, FERRITIN, TIBC, IRON, RETICCTPCT in the last 72 hours. Urine analysis: No results found for: COLORURINE, APPEARANCEUR, LABSPEC, PHURINE, GLUCOSEU, HGBUR, BILIRUBINUR, KETONESUR, PROTEINUR, UROBILINOGEN, NITRITE, LEUKOCYTESUR Sepsis Labs: @LABRCNTIP (procalcitonin:4,lacticidven:4) )No results found for this or any previous visit (from the past 240 hour(s)).   Radiological Exams on Admission: Dg Chest 2 View  Result Date: 05/21/2018 CLINICAL DATA:  Chest and abdominal pain. EXAM: CHEST - 2 VIEW COMPARISON:  03/08/2018.  CT 03/01/2018. FINDINGS: Mediastinum hilar structures normal. Prior CABG. Cardiomegaly with normal pulmonary vascularity. Low lung volumes with mild bibasilar atelectasis. Tiny nodular density noted over the right upper lung is unchanged from prior CT of 03/01/2018. Reference is made to that report for further recommendations. No pleural effusion or pneumothorax. Hiatal hernia noted. IMPRESSION: 1. Low lung volumes with mild bibasilar atelectasis. Tiny nodular density again noted over the right upper lung. Tiny nodular density again noted over the right upper lung, unchanged from  prior CT of 03/01/2018. Reference is made to prior CT report for further discussion. 2.  Prior CABG.  Cardiomegaly.  No pulmonary venous congestion. 3.  Hiatal hernia noted. Electronically Signed   By: Maisie Fus  Register   On: 05/21/2018 10:52   Ct Abdomen Pelvis W Contrast  Result Date: 05/21/2018 CLINICAL DATA:  Diarrhea. EXAM: CT  ABDOMEN AND PELVIS WITH CONTRAST TECHNIQUE: Multidetector CT imaging of the abdomen and pelvis was performed using the standard protocol following bolus administration of intravenous contrast. CONTRAST:  80mL OMNIPAQUE IOHEXOL 300 MG/ML  SOLN COMPARISON:  CT chest 03/01/2018. FINDINGS: Lower chest: Scarring noted within the lung bases. No pleural effusion. Calcification within the RCA coronary artery noted. Hepatobiliary: No focal liver abnormality is seen. Status post cholecystectomy. No biliary dilatation. Pancreas: Unremarkable. No pancreatic ductal dilatation or surrounding inflammatory changes. Spleen: Normal in size without focal abnormality. Adrenals/Urinary Tract: Normal appearance of the adrenal glands. Multiple bilateral kidney cysts are identified. Extensive renal cortical scarring and atrophy noted. There is a large left-sided staghorn calculus identified which measures approximately 8 cm in cranial caudal dimension. Urinary bladder appears normal. Stomach/Bowel: Large hiatal hernia identified. Stomach otherwise unremarkable. The small bowel loops have a normal course and caliber. The colon appears collapsed. No definite colonic inflammation identified. Vascular/Lymphatic: Aortic atherosclerosis without aneurysm. No abdominal or pelvic adenopathy. No inguinal adenopathy. Reproductive: Status post hysterectomy. No adnexal masses. Other: No free fluid or fluid collections. Musculoskeletal: Degenerative disc disease noted throughout the lumbar spine. Anterolisthesis of L4 on L5 is identified. No suspicious bone lesions identified. IMPRESSION: 1. No findings identified to explain patient's diarrhea. 2. Bilateral polycystic kidneys with extensive scarring and cortical volume loss. 3. 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 Aortic atherosclerosis and coronary artery atherosclerotic calcifications. Aortic Atherosclerosis (ICD10-I70.0). 5. Large hiatal hernia. Electronically Signed   By: Signa Kell M.D.   On: 05/21/2018 13:29    EKG: (Independently reviewed) sinus tachycardia with ventricular rate 105 bpm, QTC 442 ms, unifocal PVC, normal R wave rotation, low voltage QRS complexes.  No acute ischemic changes  Assessment/Plan) Principal Problem:   Symptomatic anemia/ Heme positive stool -Patient presents with less than 24 hours of RLQ abdominal pain followed by black diarrhea that is heme positive -Differential includes UGI bleed (elevated BUN) vs early ischemic colitis with known vascular disease -Hold Plavix and aspirin -Allow clears -GI consultation; plans for upper endoscopy on 7/6 -Hemoglobin at baseline 11.4 with current hemoglobin 9.2; stat CBC and pending results will transfuse at least 1 possibly 2 units PRBCs and follow CBC accordingly -Associated tachycardia and has received 500 cc normal saline by EDP-10 you IV fluids until blood transfused  **Rpt hemoglobin down to 8.0 therefore will transfuse 2 units PRBCs and repeat CBC after blood infused -Hold maintenance fluid while blood infusing  Active Problems:   Hypertension -Current blood pressure suboptimal in context of evolving GI bleeding -Hold preadmission Cardizem and Lasix    CAD/s/p CABG Mar 2019 w/ DES to vein graft Apr 2019 -Patient asymptomatic although is reporting shortness of breath in context of evolving anemia -Holding aspirin and Plavix in context of acute GI bleeding-defer to cardiology and GI asked when can resume -Consult cardiology to assist -Was not on beta-blocker prior to admission -For completeness of evaluation cycle troponin    Chronic combined systolic and diastolic heart failure  -Appears compensated -Holding Lasix and potassium as above -Last echocardiogram was in April 2019: EF 40 to 55%, grade 1 diastolic dysfunction with mild concentric LVH,  mild MR    CKD (chronic kidney disease), stage III  -Slight worsening in GFR in context of acute anemia -Elevated BUN likely related to GI  bleed -Follow labs    GERD (gastroesophageal reflux disease) -GI has initiated IV PPI twice daily -NPO after midnight with plans to pursue endoscopy in a.m. on 7/6    Type II diabetes mellitus  -On low-dose nocturnal Lantus-hold for now -Follow CBGs and provide SSI -HgbA1c    HLD (hyperlipidemia) -Hold Lipitor for now    Sleep apnea/Chronic hypoxemic respiratory failure  -Currently using continuous oxygen for comfort but typically utilizes 2 L nocturnally for underlying sleep apnea    **Additional lab, imaging and/or diagnostic evaluation at discretion of supervising physician  DVT prophylaxis: SCDs Code Status: Full Family Communication: Daughter Disposition Plan: Home Consults called: Cardiology/[facility]: Gastroenterology/[facility]    Russella Dar ANP-BC [facility] Pager [phone]   If 7PM-7AM, please contact night-coverage [email or web address] Password [email or web address]  05/21/2018, 3:18 PM

## 2018-05-21 NOTE — Consult Note (Addendum)
The patient has been seen in conjunction with Ronie Spies, PA-C. All aspects of care have been considered and discussed. The patient has been personally interviewed, examined, and all clinical data has been reviewed.   Native diagonal drug-eluting stent March 02, 2018.  We are still within the 29-month window.  Heart rate 115 blood pressure 135/70 mmHg.  Monitor closely for recurrent bleeding.  Okay to hold aspirin.  Continue Plavix.  If bleeding continues, may have to discontinue Plavix as well.  Risk of stent thrombosis would be increased however the stented vessel is relatively small.  P2 Y 12 assay in a.m.  We will follow.  Cardiology Consultation:   Patient ID: Margaret Ward; [id number]; 09-Sep-1941   Admit date: 05/21/2018 Date of Consult: 05/21/2018  Primary Care Provider: Sharlene Dory, DO Primary Cardiologist: Norman Herrlich, MD  Chief Complaint: abdominal pain  Patient Profile:   Margaret Ward is a 77 y.o. 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  History of Present Illness:   [facility] Workup revealed multivessel CAD subsequently underwent CABG x 3. Her pre op echo showed " preserved systolic function with a small apical aneurysm." Daughter brought her back to [place]. In 02/2018 she was admitted for recurrent chest pain and underwent cath showing 2/3 patent bypass grafts (early graft closure of the SVG to the diagonal). She underwent DES to D1. Cardzem and atenolol were discontinued and low dose lisinopril and Toprol was started given LV dysfunction with EF 40-45%.  It was recommended she continue ASA and Plavix x 1 year (was on these prior to admission). 2D echo 03/02/18 showed EF 40-45%, mild diffuse hypokinesis, grade 1 DD, mild MR, mod-severely dilated LA, moderately dilated RA. Post DC she had 2 visits back to ERs for what was treated as pneumonia. In follow-up 03/09/18 she was reporting SOB with evidence for fluid overload and so metoprolol was discontinued for concern for bronchospasm, diltiazem was restarted, and diuretic was added. Phone notes indicate she was then tried on acebutolol but continued to have dyspnea so this was stopped. At her last OV 04/29/18 with Dr. Dulce Sellar she was doing much better. He recommended to continue DAPT until October and at that point drop out aspirin given reports of bruising. Hgb remained stable 04/29/18 at 11.4 compared to prior.  She presented back to the hospital today with vague abdominal discomfort for the last week, then today had 3 large volume melanotic diarrheal stools. She states she's been having dark stools since starting iron, but this was much different. She also felt profoundly weak. No frank red blood. She was noted to have sinus tach with orthostatic increase in HR with standing (115->140bpm), no hypoxia. Labs revealed marked elevation in BUN to 88, Hgb down to 8.0 from baseline 11.4, and heme-positive stools. CT abd/pelvis unremarkable for acute finding, did note polycystic kidneys and large hiatal hernia. She received 500cc NS and written to receive 2 U PRBC. Troponin negative and BNP WNL, AKI on probable CKD stage IIII (BUN/Cr 88/1.49), albumin 2.8. GI has seen and feels differential would be Cameron ulcers versus gastritis versus ulcer disease. IV Protonix has  been started and antiplatelets are on hold. Blood pressure nadir 92/68. She denies any SOB/CP - has been doing well from cardiac standpoint last few weeks, trying to do more. She and daughter feel she may have slightly more edema on board but not markedly so. No  orthopnea. EGD planned for AM.  Past Medical History:  Diagnosis Date  . Anemia   . Arthritis    "hands, arms, all over the place" (03/02/2018)  . CAD in native artery    a. s/p CABG in 01/2018. b. Recurrent CP 02/2018 -> cath showing early graft closure SVG-diagonal, received DES to D1.  . Chronic combined systolic and diastolic CHF (congestive heart failure) (HCC)    a. LVEF 40-45% by echo 02/2018.  Marland Kitchen Chronic lower back pain   . CKD (chronic kidney disease), stage III (HCC)   . GERD (gastroesophageal reflux disease)   . Glass eye    right eye  . Gout    "on daily RX" (03/02/2018)  . History of kidney stones   . Hyperlipidemia   . Hypertension   . Hypertensive heart disease   . Kidney cysts   . Kidney disease   . Left ventricular aneurysm   . Migraine    "used to get them twice/week; haven't had them in a long time" (03/02/2018)  . NSTEMI (non-ST elevated myocardial infarction) (HCC) 03/01/2018  . On home oxygen therapy    "3L at night and prn during the day" (03/02/2018)  . Pneumonia    used to get it twice/year; stopped after I had hysterectomy" (03/02/2018)  . Sleep apnea   . Type II diabetes mellitus (HCC)     Past Surgical History:  Procedure Laterality Date  . BACK SURGERY    . CARDIAC CATHETERIZATION  12/2017   "before [facility]"  . CHOLECYSTECTOMY OPEN  1982  . CORONARY ARTERY BYPASS GRAFT  01/19/2018   "CABG X3; in [place]"  . CORONARY STENT INTERVENTION N/A 03/02/2018   Procedure: CORONARY STENT INTERVENTION;  Surgeon: Kathleene Hazel, MD;  Location: [facility];  Service: Cardiovascular;  Laterality: N/A;  . DILATION AND CURETTAGE OF UTERUS    . 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 KNEE ARTHROSCOPY Right X 4  . LEFT HEART CATH AND CORS/GRAFTS ANGIOGRAPHY N/A 03/02/2018   Procedure: LEFT HEART CATH AND CORS/GRAFTS ANGIOGRAPHY;  Surgeon: Kathleene Hazel, MD;  Location: [facility];  Service: Cardiovascular;  Laterality: N/A;    . LUMBAR DISC SURGERY    . REDUCTION MAMMAPLASTY Bilateral X 2  . TONSILLECTOMY  1953  . VAGINAL HYSTERECTOMY  1970     Inpatient Medications: Scheduled Meds: . sodium chloride   Intravenous Once  . allopurinol  100 mg Oral Daily  . atorvastatin  40 mg Oral QPM  . insulin aspart  0-15 Units Subcutaneous TID WC  . insulin aspart  0-5 Units Subcutaneous QHS  . pantoprazole (PROTONIX) IV  40 mg Intravenous Q12H  . sodium chloride flush  3 mL Intravenous Q12H   Continuous Infusions: . sodium chloride 100 mL/hr at 05/21/18 1637   PRN Meds: acetaminophen **OR** acetaminophen, albuterol, ondansetron **OR** ondansetron (ZOFRAN) IV  Home Meds: Prior to Admission medications   Medication Sig Start Date End Date Taking? Authorizing Provider  acetaminophen (TYLENOL) 500 MG tablet Take 500 mg by mouth every 6 (six) hours as needed for headache.   Yes [provider]  albuterol (PROVENTIL HFA;VENTOLIN HFA) 108 (90 Base) MCG/ACT  inhaler Inhale 2 puffs into the lungs every 6 (six) hours as needed for wheezing or shortness of breath. 03/09/18  Yes Baldo Daub, MD  allopurinol (ZYLOPRIM) 100 MG tablet Take 1 tablet (100 mg total) by mouth daily. 05/12/18  Yes Sharlene Dory, DO  aspirin EC 81 MG tablet Take 81 mg by mouth daily.   Yes [provider]  atorvastatin (LIPITOR) 40 MG tablet Take 40 mg by mouth every evening.  02/16/18  Yes [provider]  clopidogrel (PLAVIX) 75 MG tablet Take 75 mg by mouth daily.   Yes [provider]  diltiazem (CARDIZEM CD) 240 MG 24 hr capsule Take 1 capsule (240 mg total) by mouth daily. 03/09/18 06/07/18 Yes Baldo Daub, MD  ferrous sulfate 325 (65 FE) MG tablet Take 325 mg by mouth daily with breakfast.   Yes [provider]  furosemide (LASIX) 40 MG tablet Take 1 tablet (40 mg total) by mouth daily. Take 1 additional tablet on Monday, Wednesday, Friday Patient taking differently: Take 40-80 mg by mouth  See admin instructions. Take 1 tablet daily, and 1 additional tablet on Monday, Wednesday, Friday (take additional tablet at 3PM) 03/29/18 06/27/18 Yes Munley, Iline Oven, MD  Insulin Glargine (LANTUS) 100 UNIT/ML Solostar Pen Inject 4 Units into the skin at bedtime. 04/23/18  Yes Sharlene Dory, DO  Melatonin 10 MG TABS Take 10 mg by mouth at bedtime.    Yes [provider]  nitroGLYCERIN (NITROSTAT) 0.4 MG SL tablet Place 1 tablet (0.4 mg total) under the tongue every 5 (five) minutes x 3 doses as needed for chest pain. 03/03/18  Yes Duke, Roe Rutherford, PA  Phenyleph-Chlorphen-Codeine 03-18-09 MG/5ML SYRP Take 5 mLs by mouth every 6 (six) hours as needed. Patient taking differently: Take 5 mLs by mouth every 6 (six) hours as needed (cough).  03/08/18  Yes Upstill, Melvenia Beam, PA-C  potassium chloride (KLOR-CON 10) 10 MEQ tablet Take 2 tabs with every dose of Lasix. 03/12/18  Yes Sharlene Dory, DO  ranitidine (ZANTAC) 150 MG tablet Take 150 mg by mouth daily.   Yes [provider]  vitamin C (ASCORBIC ACID) 500 MG tablet Take 500 mg by mouth 2 (two) times daily.   Yes [provider]    Allergies:    Allergies  Allergen Reactions  . Methylprednisolone     Severe cramps legs and arms  . Keflex [Cephalexin] Hives  . Ketorolac Swelling  . Morphine And Related     Itching   . Novocain [Procaine] Swelling  . Septra [Sulfamethoxazole-Trimethoprim] Nausea And Vomiting    Social History:   Social History   Socioeconomic History  . Marital status: Married    Spouse name: Not on file  . Number of children: Not on file  . Years of education: Not on file  . Highest education level: Not on file  Occupational History  . Not on file  Social Needs  . Financial resource strain: Not on file  . Food insecurity:    Worry: Not on file    Inability: Not on file  . Transportation needs:    Medical: Not on file    Non-medical: Not on file  Tobacco Use  . Smoking  status: Former Smoker    Packs/day: 0.12    Years: 3.00    Pack years: 0.36    Types: Cigarettes    Last attempt to quit: 1963    Years since quitting: 56.5  . Smokeless tobacco: Never  Used  Substance and Sexual Activity  . Alcohol use: Not Currently    Frequency: Never  . Drug use: Never  . Sexual activity: Not on file  Lifestyle  . Physical activity:    Days per week: Not on file    Minutes per session: Not on file  . Stress: Not on file  Relationships  . Social connections:    Talks on phone: Not on file    Gets together: Not on file    Attends religious service: Not on file    Active member of club or organization: Not on file    Attends meetings of clubs or organizations: Not on file    Relationship status: Not on file  . Intimate partner violence:    Fear of current or ex partner: Not on file    Emotionally abused: Not on file    Physically abused: Not on file    Forced sexual activity: Not on file  Other Topics Concern  . Not on file  Social History Narrative  . Not on file    Family History:   The patient's family history includes Diabetes in her father; Heart Problems in her mother; Heart disease in her brother; Hypertension in her brother, daughter, daughter, daughter, and daughter; Kidney disease in her brother and sister.  ROS:  Please see the history of present illness.  All other ROS reviewed and negative.     Physical Exam/Data:   Vitals:   05/21/18 1315 05/21/18 1345 05/21/18 1503 05/21/18 1621  BP: 94/69 93/71  135/70  Pulse: (!) 126 (!) 106  (!) 119  Resp: (!) 21 18  (!) 21  Temp:    99.1 F (37.3 C)  TempSrc:    Oral  SpO2: (!) 79% 100% 100% 98%  Weight:      Height:       No intake or output data in the 24 hours ending 05/21/18 1644 Filed Weights   05/21/18 0953  Weight: 173 lb (78.5 kg)   Body mass index is 31.64 kg/m.  General: Well developed, well nourished WF in no acute distress. Laying 20 degrees in bed Head: Normocephalic,  atraumatic, sclera non-icteric, no xanthomas, nares are without discharge Neck: Negative for carotid bruits. JVD not elevated. Lungs: Clear bilaterally to auscultation without wheezes, rales, or rhonchi. Breathing is unlabored. Heart: Mildly tachycardic, reg rhythm, with S1 S2. No murmurs, rubs, or gallops appreciated. Abdomen: Soft, non-tender, non-distended with normoactive bowel sounds. No hepatomegaly. No rebound/guarding. No obvious abdominal masses. Msk:  Strength and tone appear normal for age. Extremities: No clubbing or cyanosis. Trace sockline edema.  Distal pedal pulses are 2+ and equal bilaterally. Neuro: Alert and oriented X 3. No facial asymmetry. No focal deficit. Moves all extremities spontaneously. Psych:  Responds to questions appropriately with a normal affect.  EKG:  The EKG was personally reviewed and demonstrates sinus tach with nonspecific STT changes similar to prior  Relevant CV Studies: Referenced above.  Laboratory Data:  Chemistry Recent Labs  Lab 05/21/18 0958  NA 141  K 4.8  CL 107  CO2 23  GLUCOSE 221*  BUN 88*  CREATININE 1.49*  CALCIUM 8.2*  GFRNONAA 33*  GFRAA 38*  ANIONGAP 11    Recent Labs  Lab 05/21/18 0958  PROT 5.4*  ALBUMIN 2.8*  AST 18  ALT 17  ALKPHOS 71  BILITOT 0.5   Hematology Recent Labs  Lab 05/21/18 0958 05/21/18 1528  WBC 14.2* 9.5  RBC 3.08* 2.66*  HGB  9.2* 8.0*  HCT 30.4* 26.3*  MCV 98.7 98.9  MCH 29.9 30.1  MCHC 30.3 30.4  RDW 14.2 14.6  PLT 193 175   Cardiac EnzymesNo results for input(s): TROPONINI in the last 168 hours.  Recent Labs  Lab 05/21/18 1010  TROPIPOC 0.00    BNP Recent Labs  Lab 05/21/18 0958  BNP 68.8    DDimer No results for input(s): DDIMER in the last 168 hours.  Radiology/Studies:  Dg Chest 2 View  Result Date: 05/21/2018 CLINICAL DATA:  Chest and abdominal pain. EXAM: CHEST - 2 VIEW COMPARISON:  03/08/2018.  CT 03/01/2018. FINDINGS: Mediastinum hilar structures normal.  Prior CABG. Cardiomegaly with normal pulmonary vascularity. Low lung volumes with mild bibasilar atelectasis. Tiny nodular density noted over the right upper lung is unchanged from prior CT of 03/01/2018. Reference is made to that report for further recommendations. No pleural effusion or pneumothorax. Hiatal hernia noted. IMPRESSION: 1. Low lung volumes with mild bibasilar atelectasis. Tiny nodular density again noted over the right upper lung. Tiny nodular density again noted over the right upper lung, unchanged from prior CT of 03/01/2018. Reference is made to prior CT report for further discussion. 2.  Prior CABG.  Cardiomegaly.  No pulmonary venous congestion. 3.  Hiatal hernia noted. Electronically Signed   By: Maisie Fushomas  Register   On: 05/21/2018 10:52   Ct Abdomen Pelvis W Contrast  Result Date: 05/21/2018 CLINICAL DATA:  Diarrhea. EXAM: CT ABDOMEN AND PELVIS WITH CONTRAST TECHNIQUE: Multidetector CT imaging of the abdomen and pelvis was performed using the standard protocol following bolus administration of intravenous contrast. CONTRAST:  80mL OMNIPAQUE IOHEXOL 300 MG/ML  SOLN COMPARISON:  CT chest 03/01/2018. FINDINGS: Lower chest: Scarring noted within the lung bases. No pleural effusion. Calcification within the RCA coronary artery noted. Hepatobiliary: No focal liver abnormality is seen. Status post cholecystectomy. No biliary dilatation. Pancreas: Unremarkable. No pancreatic ductal dilatation or surrounding inflammatory changes. Spleen: Normal in size without focal abnormality. Adrenals/Urinary Tract: Normal appearance of the adrenal glands. Multiple bilateral kidney cysts are identified. Extensive renal cortical scarring and atrophy noted. There is a large left-sided staghorn calculus identified which measures approximately 8 cm in cranial caudal dimension. Urinary bladder appears normal. Stomach/Bowel: Large hiatal hernia identified. Stomach otherwise unremarkable. The small bowel loops have a  normal course and caliber. The colon appears collapsed. No definite colonic inflammation identified. Vascular/Lymphatic: Aortic atherosclerosis without aneurysm. No abdominal or pelvic adenopathy. No inguinal adenopathy. Reproductive: Status post hysterectomy. No adnexal masses. Other: No free fluid or fluid collections. Musculoskeletal: Degenerative disc disease noted throughout the lumbar spine. Anterolisthesis of L4 on L5 is identified. No suspicious bone lesions identified. IMPRESSION: 1. No findings identified to explain patient's diarrhea. 2. Bilateral polycystic kidneys with extensive scarring and cortical volume loss. 3. 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 Aortic atherosclerosis and coronary artery atherosclerotic calcifications. Aortic Atherosclerosis (ICD10-I70.0). 5. Large hiatal hernia. Electronically Signed   By: Signa Kellaylor  Stroud M.D.   On: 05/21/2018 13:29    Assessment and Plan:   1. Acute GIB with symptomatic ABL anemia - being managed by IM/GI as above. Definitely needs GI eval to evaluate and treat source of bleeding. Cleared from cardiac standpoint to do so - no recent accelerating anginal symptoms.  2. CAD s/p CABG 01/2018, DES to D1 02/2018 - not on BB as above. Continue statin. The daughter said that GI told her he would be OK with her staying on her antiplatelets if she had to. I called Dr.  Brahmbatt to clarify and confirmed this with him on the phone. Dr. Georgiann Cocker said holding aspirin would be preferred, but would proceed on DAPT if unable to be held. He is OK with continuing Plavix. She has not yet taken Plavix today - we will resume with caution, but will hold aspirin for now. This is understanding there is a risk of stent thrombosis but obviously risk outweighs benefit with evidence of hemodynamically significant anemia upon admision.  3. Chronic combined CHF with EF 40-45% - taken off ACEI 03/09/18 due to intense coughing in setting of recently tx PNA. Once over acute  issues may be able to re-challenge with ARB. Did not tolerate Toprol or acebutolol but again this was in the setting of acute respiratory issue. More recently has been on diltiazem which may be less ideal with LV function. Needs strict I/O's and daily weights while being volume resuscitated. Edema is minimal and may also be related to decreased albumin. Will likely need to resume some diuretic once more stable from GI standpoint. BNP wnl.  4. Hypotension with hx of HTN - BP agents on hold.  5. AKI on CKD stage III (?polycystic kidneys on CT) - will need to trend this admission.  For questions or updates, please contact [facility] Please consult [email or web address] for contact info under Cardiology/STEMI.    Signed, Laurann Montana, PA-C  05/21/2018 4:44 PM

## 2018-05-21 NOTE — ED Provider Notes (Signed)
[facility] EMERGENCY DEPARTMENT Provider Note   CSN: [id number] Arrival date & time: 05/21/18  0940     History   Chief Complaint Chief Complaint  Patient presents with  . Shortness of Breath    HPI Margaret Ward is a 77 y.o. female.  Patient is a 77 year old female with a history of coronary artery disease status post recent CABG and subsequent stent placement, diabetes, hypertension hyperlipidemia who presents with shortness of breath.  She states that she is on chronic oxygen at 2 to 3 L/min.  She became more short of breath last night but got worse this morning.  She has an inhaler that she uses occasionally which she was given after her recent heart surgery and episode of pneumonia.  She used her inhaler with some improvement in symptoms.  She denies any known fevers.  She has chronic leg swelling which is unchanged from her baseline.  She is been weighing herself at home and has not changed weight.  She denies any nausea or vomiting.  No cough or chest congestion.  She denies any associated chest pain or discomfort.  No nausea or vomiting.  She does have some abdominal pain that started yesterday as well and this morning started having some watery diarrhea.  This is nonbloody.     Past Medical History:  Diagnosis Date  . Arthritis    "hands, arms, all over the place" (03/02/2018)  . Chest pain 02/05/2018  . Chronic lower back pain   . GERD (gastroesophageal reflux disease)   . Glass eye    right eye  . Gout    "on daily RX" (03/02/2018)  . Heart disease   . History of kidney stones   . Hyperlipidemia   . Hypertension   . Kidney cysts   . Kidney disease   . Migraine    "used to get them twice/week; haven't had them in a long time" (03/02/2018)  . NSTEMI (non-ST elevated myocardial infarction) (HCC) 03/01/2018  . On home oxygen therapy    "3L at night and prn during the day" (03/02/2018)  . Pneumonia    used to get it twice/year; stopped after I had  hysterectomy" (03/02/2018)  . Sleep apnea   . Type II diabetes mellitus [facility])     Patient Active Problem List   Diagnosis Date Noted  . Diabetes mellitus type 2 in obese (HCC) 04/23/2018  . Chronic diastolic heart failure (HCC) 03/28/2018  . Bronchospasm 03/28/2018  . Sinus tachycardia 03/09/2018  . SOB (shortness of breath) 03/09/2018  . 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  . NSTEMI (non-ST elevated myocardial infarction) (HCC) 03/01/2018  . CAD (coronary artery disease) 02/08/2018  . Hyperlipidemia 02/08/2018  . Chest pain 02/05/2018  . 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 BACK SURGERY    . CARDIAC CATHETERIZATION  12/2017   "before [facility]"  . CHOLECYSTECTOMY OPEN  1982  . CORONARY ARTERY BYPASS GRAFT  01/19/2018   "CABG X3; in [place]"  . CORONARY STENT INTERVENTION N/A 03/02/2018   Procedure: CORONARY STENT INTERVENTION;  Surgeon: Kathleene Hazel, MD;  Location: [facility] INVASIVE CV LAB;  Service: Cardiovascular;  Laterality: N/A;  . DILATION AND CURETTAGE OF UTERUS    . 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 KNEE ARTHROSCOPY Right X 4  . LEFT HEART CATH AND CORS/GRAFTS ANGIOGRAPHY N/A 03/02/2018   Procedure: LEFT HEART CATH  AND CORS/GRAFTS ANGIOGRAPHY;  Surgeon: Kathleene Hazel, MD;  Location: [facility] INVASIVE CV LAB;  Service: Cardiovascular;  Laterality: N/A;  . LUMBAR DISC SURGERY    . REDUCTION MAMMAPLASTY Bilateral X 2  . TONSILLECTOMY  1953  . VAGINAL HYSTERECTOMY  1970     OB History   None      Home Medications    Prior to Admission medications   Medication Sig Start Date End Date Taking? Authorizing Provider  acetaminophen (TYLENOL) 500 MG tablet Take 500 mg by mouth every 6 (six) hours as needed for headache.   Yes [provider]  albuterol (PROVENTIL HFA;VENTOLIN HFA) 108 (90 Base) MCG/ACT inhaler Inhale 2 puffs into the lungs every 6 (six) hours as needed  for wheezing or shortness of breath. 03/09/18  Yes Baldo Daub, MD  allopurinol (ZYLOPRIM) 100 MG tablet Take 1 tablet (100 mg total) by mouth daily. 05/12/18  Yes Sharlene Dory, DO  aspirin EC 81 MG tablet Take 81 mg by mouth daily.   Yes [provider]  atorvastatin (LIPITOR) 40 MG tablet Take 40 mg by mouth every evening.  02/16/18  Yes [provider]  clopidogrel (PLAVIX) 75 MG tablet Take 75 mg by mouth daily.   Yes [provider]  diltiazem (CARDIZEM CD) 240 MG 24 hr capsule Take 1 capsule (240 mg total) by mouth daily. 03/09/18 06/07/18 Yes Baldo Daub, MD  ferrous sulfate 325 (65 FE) MG tablet Take 325 mg by mouth daily with breakfast.   Yes [provider]  furosemide (LASIX) 40 MG tablet Take 1 tablet (40 mg total) by mouth daily. Take 1 additional tablet on Monday, Wednesday, Friday Patient taking differently: Take 40-80 mg by mouth See admin instructions. Take 1 tablet daily, and 1 additional tablet on Monday, Wednesday, Friday (take additional tablet at 3PM) 03/29/18 06/27/18 Yes Munley, Iline Oven, MD  Insulin Glargine (LANTUS) 100 UNIT/ML Solostar Pen Inject 4 Units into the skin at bedtime. 04/23/18  Yes Sharlene Dory, DO  Melatonin 10 MG TABS Take 10 mg by mouth at bedtime.    Yes [provider]  nitroGLYCERIN (NITROSTAT) 0.4 MG SL tablet Place 1 tablet (0.4 mg total) under the tongue every 5 (five) minutes x 3 doses as needed for chest pain. 03/03/18  Yes Duke, Roe Rutherford, PA  Phenyleph-Chlorphen-Codeine 03-18-09 MG/5ML SYRP Take 5 mLs by mouth every 6 (six) hours as needed. Patient taking differently: Take 5 mLs by mouth every 6 (six) hours as needed (cough).  03/08/18  Yes Upstill, Melvenia Beam, PA-C  potassium chloride (KLOR-CON 10) 10 MEQ tablet Take 2 tabs with every dose of Lasix. 03/12/18  Yes Sharlene Dory, DO  ranitidine (ZANTAC) 150 MG tablet Take 150 mg by mouth daily.   Yes [provider]  vitamin  C (ASCORBIC ACID) 500 MG tablet Take 500 mg by mouth 2 (two) times daily.   Yes [provider]    Family History Family History  Problem Relation Age of Onset  . Heart Problems Mother   . Diabetes Father   . Kidney disease Sister   . Heart disease Brother   . Hypertension Brother   . Kidney disease Brother   . Hypertension Daughter   . Hypertension Daughter   . Hypertension Daughter   . Hypertension Daughter     Social History Social History   Tobacco Use  . Smoking status: Former Smoker    Packs/day: 0.12    Years: 3.00  Pack years: 0.36    Types: Cigarettes    Last attempt to quit: 1963    Years since quitting: 56.5  . Smokeless tobacco: Never Used  Substance Use Topics  . Alcohol use: Not Currently    Frequency: Never  . Drug use: Never     Allergies   Methylprednisolone; Keflex [cephalexin]; Ketorolac; Morphine and related; Novocain [procaine]; and Septra [sulfamethoxazole-trimethoprim]   Review of Systems Review of Systems  Constitutional: Positive for fatigue. Negative for chills, diaphoresis and fever.  HENT: Negative for congestion, rhinorrhea and sneezing.   Eyes: Negative.   Respiratory: Positive for shortness of breath. Negative for cough and chest tightness.   Cardiovascular: Negative for chest pain and leg swelling.  Gastrointestinal: Positive for abdominal pain and diarrhea. Negative for blood in stool, nausea and vomiting.  Genitourinary: Negative for difficulty urinating, flank pain, frequency and hematuria.  Musculoskeletal: Negative for arthralgias and back pain.  Skin: Negative for rash.  Neurological: Negative for dizziness, speech difficulty, weakness, numbness and headaches.     Physical Exam Updated Vital Signs BP 119/62   Pulse (!) 116   Temp 98.9 F (37.2 C)   Resp (!) 21   Ht 5\' 2"  (1.575 m)   Wt 78.5 kg (173 lb)   SpO2 100%   BMI 31.64 kg/m   Physical Exam  Constitutional: She is oriented to person, place,  and time. She appears well-developed and well-nourished.  HENT:  Head: Normocephalic and atraumatic.  Eyes: Pupils are equal, round, and reactive to light.  Neck: Normal range of motion. Neck supple.  Cardiovascular: Normal rate, regular rhythm and normal heart sounds.  Pulmonary/Chest: Effort normal and breath sounds normal. No accessory muscle usage. Tachypnea noted. No respiratory distress. She has no wheezes. She has no rales. She exhibits no tenderness.  Abdominal: Soft. Bowel sounds are normal. There is tenderness (Generalized tenderness but more in the left upper and lower quadrants). There is no rebound and no guarding.  Musculoskeletal: Normal range of motion.       Right lower leg: She exhibits edema.       Left lower leg: She exhibits edema.  Lymphadenopathy:    She has no cervical adenopathy.  Neurological: She is alert and oriented to person, place, and time.  Skin: Skin is warm and dry. No rash noted.  Psychiatric: She has a normal mood and affect.     ED Treatments / Results  Labs (all labs ordered are listed, but only abnormal results are displayed) Labs Reviewed  CBC WITH DIFFERENTIAL/PLATELET - Abnormal; Notable for the following components:      Result Value   WBC 14.2 (*)    RBC 3.08 (*)    Hemoglobin 9.2 (*)    HCT 30.4 (*)    Neutro Abs 11.7 (*)    All other components within normal limits  COMPREHENSIVE METABOLIC PANEL - Abnormal; Notable for the following components:   Glucose, Bld 221 (*)    BUN 88 (*)    Creatinine, Ser 1.49 (*)    Calcium 8.2 (*)    Total Protein 5.4 (*)    Albumin 2.8 (*)    GFR calc non Af Amer 33 (*)    GFR calc Af Amer 38 (*)    All other components within normal limits  POC OCCULT BLOOD, ED - Abnormal; Notable for the following components:   Fecal Occult Bld POSITIVE (*)    All other components within normal limits  BRAIN NATRIURETIC PEPTIDE  LIPASE, BLOOD  URINALYSIS, ROUTINE W REFLEX MICROSCOPIC  I-STAT TROPONIN, ED     EKG EKG Interpretation  Date/Time:  Friday May 21 2018 09:50:20 EDT Ventricular Rate:  105 PR Interval:    QRS Duration: 96 QT Interval:  334 QTC Calculation: 442 R Axis:   40 Text Interpretation:  Sinus tachycardia Ventricular premature complex Nonspecific T abnormalities, lateral leads Confirmed by Rolan Bucco [phone]) on 05/21/2018 10:05:35 AM Also confirmed by Rolan Bucco [phone]), editor Josephine Igo ([id number])  on 05/21/2018 10:32:51 AM   Radiology Dg Chest 2 View  Result Date: 05/21/2018 CLINICAL DATA:  Chest and abdominal pain. EXAM: CHEST - 2 VIEW COMPARISON:  03/08/2018.  CT 03/01/2018. FINDINGS: Mediastinum hilar structures normal. Prior CABG. Cardiomegaly with normal pulmonary vascularity. Low lung volumes with mild bibasilar atelectasis. Tiny nodular density noted over the right upper lung is unchanged from prior CT of 03/01/2018. Reference is made to that report for further recommendations. No pleural effusion or pneumothorax. Hiatal hernia noted. IMPRESSION: 1. Low lung volumes with mild bibasilar atelectasis. Tiny nodular density again noted over the right upper lung. Tiny nodular density again noted over the right upper lung, unchanged from prior CT of 03/01/2018. Reference is made to prior CT report for further discussion. 2.  Prior CABG.  Cardiomegaly.  No pulmonary venous congestion. 3.  Hiatal hernia noted. Electronically Signed   By: Maisie Fus  Register   On: 05/21/2018 10:52   Ct Abdomen Pelvis W Contrast  Result Date: 05/21/2018 CLINICAL DATA:  Diarrhea. EXAM: CT ABDOMEN AND PELVIS WITH CONTRAST TECHNIQUE: Multidetector CT imaging of the abdomen and pelvis was performed using the standard protocol following bolus administration of intravenous contrast. CONTRAST:  80mL OMNIPAQUE IOHEXOL 300 MG/ML  SOLN COMPARISON:  CT chest 03/01/2018. FINDINGS: Lower chest: Scarring noted within the lung bases. No pleural effusion. Calcification within the RCA coronary artery noted.  Hepatobiliary: No focal liver abnormality is seen. Status post cholecystectomy. No biliary dilatation. Pancreas: Unremarkable. No pancreatic ductal dilatation or surrounding inflammatory changes. Spleen: Normal in size without focal abnormality. Adrenals/Urinary Tract: Normal appearance of the adrenal glands. Multiple bilateral kidney cysts are identified. Extensive renal cortical scarring and atrophy noted. There is a large left-sided staghorn calculus identified which measures approximately 8 cm in cranial caudal dimension. Urinary bladder appears normal. Stomach/Bowel: Large hiatal hernia identified. Stomach otherwise unremarkable. The small bowel loops have a normal course and caliber. The colon appears collapsed. No definite colonic inflammation identified. Vascular/Lymphatic: Aortic atherosclerosis without aneurysm. No abdominal or pelvic adenopathy. No inguinal adenopathy. Reproductive: Status post hysterectomy. No adnexal masses. Other: No free fluid or fluid collections. Musculoskeletal: Degenerative disc disease noted throughout the lumbar spine. Anterolisthesis of L4 on L5 is identified. No suspicious bone lesions identified. IMPRESSION: 1. No findings identified to explain patient's diarrhea. 2. Bilateral polycystic kidneys with extensive scarring and cortical volume loss. 3. 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 Aortic atherosclerosis and coronary artery atherosclerotic calcifications. Aortic Atherosclerosis (ICD10-I70.0). 5. Large hiatal hernia. Electronically Signed   By: Signa Kell M.D.   On: 05/21/2018 13:29    Procedures Procedures (including critical care time)  Medications Ordered in ED Medications  sodium chloride 0.9 % bolus 500 mL (has no administration in time range)  iohexol (OMNIPAQUE) 300 MG/ML solution 80 mL (80 mLs Intravenous Contrast Given 05/21/18 1257)     Initial Impression / Assessment and Plan / ED Course  I have reviewed the triage vital signs and  the nursing notes.  Pertinent labs & imaging results that were available  during my care of the patient were reviewed by me and considered in my medical decision making (see chart for details).    Patient is a 77 year old female who presents with shortness of breath.  She also has abdominal pain.  Her chest x-ray is clear without evidence of fluid overload or pneumonia.  Her oxygen saturations are at remained stable on her baseline oxygen.  However she does get fairly tachycardic and tachypneic with any movement.  She has some associated abdominal pain and diarrhea.  She had heme positive stool with a drop in her hemoglobin.  Her other labs are stable.  Her creatinine is mildly elevated from her baseline.  I spoke with Revonda StandardAllison with the hospitalist service to admit the patient for further evaluation.   Final Clinical Impressions(s) / ED Diagnoses   Final diagnoses:  SOB (shortness of breath)  Diarrhea, unspecified type  Dehydration  Iron deficiency anemia, unspecified iron deficiency anemia type    ED Discharge Orders    None       Rolan BuccoBelfi, Jaunita Mikels, MD 05/21/18 1445

## 2018-05-21 NOTE — Consult Note (Signed)
Referring Provider: [facility] Primary Care Physician:  Sharlene Dory, DO Primary Gastroenterologist:  unassigned  Reason for Consultation:  Melena/Heme positive stool  HPI: Margaret Ward is a 76 y.o. female with past medical history of coronary artery disease status post CABG in March 2019 followed NSTEMI in April 2019 requiring PCI and  drug eluding stent placement , now  on Plavix, history of diabetes, history of heart failure, history of sleep apnea presented to the hospital with abdominal pain and melena.  Patient seen and examined at bedside. Family at bedside. She started noticing black tarry stool around 1 week ago. Usually 1-2 bowel movements per day. Associated with lower quadrant abdominal cramps. Denies any bright red blood per rectum. Denies nausea or vomiting. Denies dysphagia or odynophagia. She denies any active chest pain and shortness of breath.  Colonoscopy many years ago was normal according to patient. No family history of colon cancer. Denied NSAID use.  Past Medical History:  Diagnosis Date  . Arthritis    "hands, arms, all over the place" (03/02/2018)  . Chest pain 02/05/2018  . Chronic lower back pain   . GERD (gastroesophageal reflux disease)   . Glass eye    right eye  . Gout    "on daily RX" (03/02/2018)  . Heart disease   . History of kidney stones   . Hyperlipidemia   . Hypertension   . Kidney cysts   . Kidney disease   . Migraine    "used to get them twice/week; haven't had them in a long time" (03/02/2018)  . NSTEMI (non-ST elevated myocardial infarction) (HCC) 03/01/2018  . On home oxygen therapy    "3L at night and prn during the day" (03/02/2018)  . Pneumonia    used to get it twice/year; stopped after I had hysterectomy" (03/02/2018)  . Sleep apnea   . Type II diabetes mellitus (HCC)     Past Surgical History:  Procedure Laterality Date  . BACK SURGERY    . CARDIAC CATHETERIZATION  12/2017   "before [facility]"  . CHOLECYSTECTOMY OPEN  1982  .  CORONARY ARTERY BYPASS GRAFT  01/19/2018   "CABG X3; in [place]"  . CORONARY STENT INTERVENTION N/A 03/02/2018   Procedure: CORONARY STENT INTERVENTION;  Surgeon: Kathleene Hazel, MD;  Location: [facility] INVASIVE CV [facility];  Service: Cardiovascular;  Laterality: N/A;  . DILATION AND CURETTAGE OF UTERUS    . 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 KNEE ARTHROSCOPY Right X 4  . LEFT HEART CATH AND CORS/GRAFTS ANGIOGRAPHY N/A 03/02/2018   Procedure: LEFT HEART CATH AND CORS/GRAFTS ANGIOGRAPHY;  Surgeon: Kathleene Hazel, MD;  Location: [facility] INVASIVE CV LAB;  Service: Cardiovascular;  Laterality: N/A;  . LUMBAR DISC SURGERY    . REDUCTION MAMMAPLASTY Bilateral X 2  . TONSILLECTOMY  1953  . VAGINAL HYSTERECTOMY  1970    Prior to Admission medications   Medication Sig Start Date End Date Taking? Authorizing Provider  acetaminophen (TYLENOL) 500 MG tablet Take 500 mg by mouth every 6 (six) hours as needed for headache.   Yes [provider]  albuterol (PROVENTIL HFA;VENTOLIN HFA) 108 (90 Base) MCG/ACT inhaler Inhale 2 puffs into the lungs every 6 (six) hours as needed for wheezing or shortness of breath. 03/09/18  Yes Baldo Daub, MD  allopurinol (ZYLOPRIM) 100 MG tablet Take 1 tablet (100 mg total) by mouth daily. 05/12/18  Yes Sharlene Dory, DO  aspirin EC 81 MG tablet Take  81 mg by mouth daily.   Yes [provider]  atorvastatin (LIPITOR) 40 MG tablet Take 40 mg by mouth every evening.  02/16/18  Yes [provider]  clopidogrel (PLAVIX) 75 MG tablet Take 75 mg by mouth daily.   Yes [provider]  diltiazem (CARDIZEM CD) 240 MG 24 hr capsule Take 1 capsule (240 mg total) by mouth daily. 03/09/18 06/07/18 Yes Baldo Daub, MD  ferrous sulfate 325 (65 FE) MG tablet Take 325 mg by mouth daily with breakfast.   Yes [provider]  furosemide (LASIX) 40 MG tablet Take 1 tablet (40 mg total) by mouth daily. Take 1 additional  tablet on Monday, Wednesday, Friday Patient taking differently: Take 40-80 mg by mouth See admin instructions. Take 1 tablet daily, and 1 additional tablet on Monday, Wednesday, Friday (take additional tablet at 3PM) 03/29/18 06/27/18 Yes Munley, Iline Oven, MD  Insulin Glargine (LANTUS) 100 UNIT/ML Solostar Pen Inject 4 Units into the skin at bedtime. 04/23/18  Yes Sharlene Dory, DO  Melatonin 10 MG TABS Take 10 mg by mouth at bedtime.    Yes [provider]  nitroGLYCERIN (NITROSTAT) 0.4 MG SL tablet Place 1 tablet (0.4 mg total) under the tongue every 5 (five) minutes x 3 doses as needed for chest pain. 03/03/18  Yes Duke, Roe Rutherford, PA  Phenyleph-Chlorphen-Codeine 03-18-09 MG/5ML SYRP Take 5 mLs by mouth every 6 (six) hours as needed. Patient taking differently: Take 5 mLs by mouth every 6 (six) hours as needed (cough).  03/08/18  Yes Upstill, Melvenia Beam, PA-C  potassium chloride (KLOR-CON 10) 10 MEQ tablet Take 2 tabs with every dose of Lasix. 03/12/18  Yes Sharlene Dory, DO  ranitidine (ZANTAC) 150 MG tablet Take 150 mg by mouth daily.   Yes [provider]  vitamin C (ASCORBIC ACID) 500 MG tablet Take 500 mg by mouth 2 (two) times daily.   Yes [provider]    Scheduled Meds: . allopurinol  100 mg Oral Daily  . atorvastatin  40 mg Oral QPM  . famotidine  20 mg Oral Daily  . insulin aspart  0-15 Units Subcutaneous TID WC  . insulin aspart  0-5 Units Subcutaneous QHS  . sodium chloride flush  3 mL Intravenous Q12H   Continuous Infusions: . sodium chloride    . sodium chloride 500 mL (05/21/18 1516)   PRN Meds:.acetaminophen **OR** acetaminophen, albuterol, ondansetron **OR** ondansetron (ZOFRAN) IV  Allergies as of 05/21/2018 - Review Complete 05/21/2018  Allergen Reaction Noted  . Methylprednisolone  02/04/2018  . Keflex [cephalexin] Hives 02/04/2018  . Ketorolac Swelling 02/04/2018  . Morphine and related  03/01/2018  . Novocain [procaine]  Swelling 02/04/2018  . Septra [sulfamethoxazole-trimethoprim] Nausea And Vomiting 02/04/2018    Family History  Problem Relation Age of Onset  . Heart Problems Mother   . Diabetes Father   . Kidney disease Sister   . Heart disease Brother   . Hypertension Brother   . Kidney disease Brother   . Hypertension Daughter   . Hypertension Daughter   . Hypertension Daughter   . Hypertension Daughter     Social History   Socioeconomic History  . Marital status: Married    Spouse name: Not on file  . Number of children: Not on file  . Years of education: Not on file  . Highest education level: Not on file  Occupational History  . Not on file  Social Needs  . Financial resource strain: Not on  file  . Food insecurity:    Worry: Not on file    Inability: Not on file  . Transportation needs:    Medical: Not on file    Non-medical: Not on file  Tobacco Use  . Smoking status: Former Smoker    Packs/day: 0.12    Years: 3.00    Pack years: 0.36    Types: Cigarettes    Last attempt to quit: 1963    Years since quitting: 56.5  . Smokeless tobacco: Never Used  Substance and Sexual Activity  . Alcohol use: Not Currently    Frequency: Never  . Drug use: Never  . Sexual activity: Not on file  Lifestyle  . Physical activity:    Days per week: Not on file    Minutes per session: Not on file  . Stress: Not on file  Relationships  . Social connections:    Talks on phone: Not on file    Gets together: Not on file    Attends religious service: Not on file    Active member of club or organization: Not on file    Attends meetings of clubs or organizations: Not on file    Relationship status: Not on file  . Intimate partner violence:    Fear of current or ex partner: Not on file    Emotionally abused: Not on file    Physically abused: Not on file    Forced sexual activity: Not on file  Other Topics Concern  . Not on file  Social History Narrative  . Not on file    Review of  Systems: Review of Systems  Constitutional: Negative for chills and fever.  HENT: Negative for hearing loss and tinnitus.   Eyes: Negative for blurred vision and double vision.  Respiratory: Negative for cough and hemoptysis.   Cardiovascular: Negative for chest pain and palpitations.  Gastrointestinal: Positive for abdominal pain, diarrhea and melena. Negative for heartburn, nausea and vomiting.  Genitourinary: Negative for dysuria and urgency.  Musculoskeletal: Positive for joint pain. Negative for myalgias.  Skin: Negative for rash.  Neurological: Negative for seizures and loss of consciousness.  Endo/Heme/Allergies: Does not bruise/bleed easily.  Psychiatric/Behavioral: Negative for hallucinations and suicidal ideas.    Physical Exam: Vital signs: Vitals:   05/21/18 1145 05/21/18 1200  BP: 104/60 119/62  Pulse: 98 (!) 116  Resp: (!) 27 (!) 21  Temp:    SpO2: 100% 100%     Physical Exam  Constitutional: She is oriented to person, place, and time. She appears well-developed and well-nourished. No distress.  HENT:  Head: Normocephalic and atraumatic.  Mouth/Throat: No oropharyngeal exudate.  Neck: Normal range of motion. Neck supple. No thyromegaly present.  Cardiovascular: Regular rhythm.  Murmur heard. tachycardia  Pulmonary/Chest: Effort normal and breath sounds normal. No respiratory distress.  Abdominal: Soft. Bowel sounds are normal. She exhibits no distension. There is no tenderness. There is no rebound and no guarding.  Musculoskeletal: Normal range of motion. She exhibits edema.  Neurological: She is alert and oriented to person, place, and time.  Skin: Skin is warm. No erythema.  Psychiatric: She has a normal mood and affect. Judgment and thought content normal.  Vitals reviewed.   GI:  Lab Results: Recent Labs    05/21/18 0958  WBC 14.2*  HGB 9.2*  HCT 30.4*  PLT 193   BMET Recent Labs    05/21/18 0958  NA 141  K 4.8  CL 107  CO2 23  GLUCOSE  221*  BUN 88*  CREATININE 1.49*  CALCIUM 8.2*   LFT Recent Labs    05/21/18 0958  PROT 5.4*  ALBUMIN 2.8*  AST 18  ALT 17  ALKPHOS 71  BILITOT 0.5   PT/INR No results for input(s): LABPROT, INR in the last 72 hours.   Studies/Results: Dg Chest 2 View  Result Date: 05/21/2018 CLINICAL DATA:  Chest and abdominal pain. EXAM: CHEST - 2 VIEW COMPARISON:  03/08/2018.  CT 03/01/2018. FINDINGS: Mediastinum hilar structures normal. Prior CABG. Cardiomegaly with normal pulmonary vascularity. Low lung volumes with mild bibasilar atelectasis. Tiny nodular density noted over the right upper lung is unchanged from prior CT of 03/01/2018. Reference is made to that report for further recommendations. No pleural effusion or pneumothorax. Hiatal hernia noted. IMPRESSION: 1. Low lung volumes with mild bibasilar atelectasis. Tiny nodular density again noted over the right upper lung. Tiny nodular density again noted over the right upper lung, unchanged from prior CT of 03/01/2018. Reference is made to prior CT report for further discussion. 2.  Prior CABG.  Cardiomegaly.  No pulmonary venous congestion. 3.  Hiatal hernia noted. Electronically Signed   By: Maisie Fushomas  Register   On: 05/21/2018 10:52   Ct Abdomen Pelvis W Contrast  Result Date: 05/21/2018 CLINICAL DATA:  Diarrhea. EXAM: CT ABDOMEN AND PELVIS WITH CONTRAST TECHNIQUE: Multidetector CT imaging of the abdomen and pelvis was performed using the standard protocol following bolus administration of intravenous contrast. CONTRAST:  80mL OMNIPAQUE IOHEXOL 300 MG/ML  SOLN COMPARISON:  CT chest 03/01/2018. FINDINGS: Lower chest: Scarring noted within the lung bases. No pleural effusion. Calcification within the RCA coronary artery noted. Hepatobiliary: No focal liver abnormality is seen. Status post cholecystectomy. No biliary dilatation. Pancreas: Unremarkable. No pancreatic ductal dilatation or surrounding inflammatory changes. Spleen: Normal in size without  focal abnormality. Adrenals/Urinary Tract: Normal appearance of the adrenal glands. Multiple bilateral kidney cysts are identified. Extensive renal cortical scarring and atrophy noted. There is a large left-sided staghorn calculus identified which measures approximately 8 cm in cranial caudal dimension. Urinary bladder appears normal. Stomach/Bowel: Large hiatal hernia identified. Stomach otherwise unremarkable. The small bowel loops have a normal course and caliber. The colon appears collapsed. No definite colonic inflammation identified. Vascular/Lymphatic: Aortic atherosclerosis without aneurysm. No abdominal or pelvic adenopathy. No inguinal adenopathy. Reproductive: Status post hysterectomy. No adnexal masses. Other: No free fluid or fluid collections. Musculoskeletal: Degenerative disc disease noted throughout the lumbar spine. Anterolisthesis of L4 on L5 is identified. No suspicious bone lesions identified. IMPRESSION: 1. No findings identified to explain patient's diarrhea. 2. Bilateral polycystic kidneys with extensive scarring and cortical volume loss. 3. 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 Aortic atherosclerosis and coronary artery atherosclerotic calcifications. Aortic Atherosclerosis (ICD10-I70.0). 5. Large hiatal hernia. Electronically Signed   By: Signa Kellaylor  Stroud M.D.   On: 05/21/2018 13:29    Impression/Plan: - melena in setting of aspirin and Plavix use as well as CT scan showing large hiatal hernia. Differential diagnosis would be Sheria Langameron ulcers versus gastritis versus ulcer disease. - Coronary artery disease status post CABG followed by NSTEMI requiring PCI and stent placement in April 2019 - acute blood loss anemia  Recommendations --------------------------- - Monitor H&H. Transfuse as needed to keep hemoglobin around 8. - start Protonix IV twice a day. - NPO PM  - EGD tomorrow. D/W Dr. Elvera LennoxGherghe as we as with family at bedside.   Risks (bleeding, infection, bowel  perforation that could require surgery, sedation-related changes in cardiopulmonary systems), benefits (identification and possible  treatment of source of symptoms, exclusion of certain causes of symptoms), and alternatives (watchful waiting, radiographic imaging studies, empiric medical treatment)  were explained to patient and family in detail and patient wishes to proceed.    LOS: 0 days   Kathi Der  MD, FACP 05/21/2018, 3:33 PM  Contact #  [phone]

## 2018-05-22 ENCOUNTER — Encounter (HOSPITAL_COMMUNITY)
Admission: EM | Disposition: A | Discharge status: Home / Self Care | Payer: Self-pay | Source: Home / Self Care | Attending: Family Medicine

## 2018-05-22 ENCOUNTER — Inpatient Hospital Stay (HOSPITAL_COMMUNITY): Payer: Medicare Other | Admitting: Certified Registered Nurse Anesthetist

## 2018-05-22 DIAGNOSIS — I25708 Atherosclerosis of coronary artery bypass graft(s), unspecified, with other forms of angina pectoris: Secondary | ICD-10-CM

## 2018-05-22 DIAGNOSIS — Z7189 Other specified counseling: Secondary | ICD-10-CM

## 2018-05-22 DIAGNOSIS — I519 Heart disease, unspecified: Secondary | ICD-10-CM

## 2018-05-22 DIAGNOSIS — E785 Hyperlipidemia, unspecified: Secondary | ICD-10-CM

## 2018-05-22 DIAGNOSIS — K219 Gastro-esophageal reflux disease without esophagitis: Secondary | ICD-10-CM

## 2018-05-22 DIAGNOSIS — Z955 Presence of coronary angioplasty implant and graft: Secondary | ICD-10-CM

## 2018-05-22 DIAGNOSIS — K922 Gastrointestinal hemorrhage, unspecified: Secondary | ICD-10-CM

## 2018-05-22 HISTORY — PX: ESOPHAGOGASTRODUODENOSCOPY (EGD) WITH PROPOFOL: SHX5813

## 2018-05-22 HISTORY — PX: BIOPSY: SHX5522

## 2018-05-22 LAB — COMPREHENSIVE METABOLIC PANEL
ALT: 16 U/L (ref 0–44)
AST: 16 U/L (ref 15–41)
Albumin: 2.7 g/dL — ABNORMAL LOW (ref 3.5–5.0)
Alkaline Phosphatase: 58 U/L (ref 38–126)
Anion gap: 7 (ref 5–15)
BUN: 74 mg/dL — ABNORMAL HIGH (ref 8–23)
CHLORIDE: 113 mmol/L — AB (ref 98–111)
CO2: 22 mmol/L (ref 22–32)
CREATININE: 1.33 mg/dL — AB (ref 0.44–1.00)
Calcium: 8.2 mg/dL — ABNORMAL LOW (ref 8.9–10.3)
GFR calc non Af Amer: 37 mL/min — ABNORMAL LOW (ref >60)
GFR, EST AFRICAN AMERICAN: 43 mL/min — AB (ref >60)
Glucose, Bld: 144 mg/dL — ABNORMAL HIGH (ref 70–99)
POTASSIUM: 3.8 mmol/L (ref 3.5–5.1)
SODIUM: 142 mmol/L (ref 135–145)
Total Bilirubin: 0.8 mg/dL (ref 0.3–1.2)
Total Protein: 4.8 g/dL — ABNORMAL LOW (ref 6.5–8.1)

## 2018-05-22 LAB — CBC
HCT: 30.7 % — ABNORMAL LOW (ref 36.0–46.0)
Hemoglobin: 9.8 g/dL — ABNORMAL LOW (ref 12.0–15.0)
MCH: 29.8 pg (ref 26.0–34.0)
MCHC: 31.9 g/dL (ref 30.0–36.0)
MCV: 93.3 fL (ref 78.0–100.0)
PLATELETS: 144 10*3/uL — AB (ref 150–400)
RBC: 3.29 MIL/uL — AB (ref 3.87–5.11)
RDW: 14.6 % (ref 11.5–15.5)
WBC: 7.4 10*3/uL (ref 4.0–10.5)

## 2018-05-22 LAB — URINALYSIS, ROUTINE W REFLEX MICROSCOPIC
BILIRUBIN URINE: NEGATIVE
Bacteria, UA: NONE SEEN
Glucose, UA: NEGATIVE mg/dL
Hgb urine dipstick: NEGATIVE
KETONES UR: NEGATIVE mg/dL
Nitrite: NEGATIVE
PH: 6 (ref 5.0–8.0)
Protein, ur: NEGATIVE mg/dL
Specific Gravity, Urine: 1.016 (ref 1.005–1.030)

## 2018-05-22 LAB — GLUCOSE, CAPILLARY
GLUCOSE-CAPILLARY: 127 mg/dL — AB (ref 70–99)
Glucose-Capillary: 181 mg/dL — ABNORMAL HIGH (ref 70–99)
Glucose-Capillary: 154 mg/dL — ABNORMAL HIGH (ref 70–99)
Glucose-Capillary: 195 mg/dL — ABNORMAL HIGH (ref 70–99)

## 2018-05-22 LAB — TROPONIN I
TROPONIN I: 0.03 ng/mL — AB (ref <0.03)
TROPONIN I: 0.03 ng/mL — AB (ref <0.03)
Troponin I: 0.03 ng/mL (ref <0.03)

## 2018-05-22 LAB — PLATELET INHIBITION P2Y12: Platelet Function  P2Y12: 50 [PRU] — ABNORMAL LOW (ref 194–418)

## 2018-05-22 SURGERY — ESOPHAGOGASTRODUODENOSCOPY (EGD) WITH PROPOFOL
Anesthesia: Monitor Anesthesia Care

## 2018-05-22 MED ORDER — PROPOFOL 500 MG/50ML IV EMUL
INTRAVENOUS | Status: DC | PRN
  Administered 2018-05-22: 80 ug/kg/min via INTRAVENOUS

## 2018-05-22 MED ORDER — SODIUM CHLORIDE 0.9 % IV SOLN
INTRAVENOUS | Status: DC
  Administered 2018-05-22 (×2): via INTRAVENOUS

## 2018-05-22 MED ORDER — SODIUM CHLORIDE 0.9 % IV SOLN
INTRAVENOUS | Status: DC | PRN
  Administered 2018-05-22: 09:00:00 via INTRAVENOUS

## 2018-05-22 MED ORDER — GLUCERNA SHAKE PO LIQD
237.0000 mL | Freq: Two times a day (BID) | ORAL | Status: DC
  Administered 2018-05-22 – 2018-05-25 (×6): 237 mL via ORAL

## 2018-05-22 MED ORDER — PROPOFOL 10 MG/ML IV BOLUS
INTRAVENOUS | Status: DC | PRN
  Administered 2018-05-22 (×3): 20 mg via INTRAVENOUS

## 2018-05-22 SURGICAL SUPPLY — 14 items

## 2018-05-22 NOTE — Plan of Care (Signed)
  Problem: Clinical Measurements: Goal: Ability to maintain clinical measurements within normal limits will improve Outcome: Progressing Goal: Diagnostic test results will improve Outcome: Progressing   

## 2018-05-22 NOTE — Anesthesia Postprocedure Evaluation (Signed)
Anesthesia Post Note  Patient: Margaret Ward  Procedure(s) Performed: ESOPHAGOGASTRODUODENOSCOPY (EGD) WITH PROPOFOL (N/A ) BIOPSY     Patient location during evaluation: PACU Anesthesia Type: MAC Level of consciousness: awake and alert Pain management: pain level controlled Vital Signs Assessment: post-procedure vital signs reviewed and stable Respiratory status: spontaneous breathing, nonlabored ventilation and respiratory function stable Cardiovascular status: stable and blood pressure returned to baseline Postop Assessment: no apparent nausea or vomiting Anesthetic complications: no    Last Vitals:  Vitals:   05/22/18 0950 05/22/18 1000  BP: (!) 105/50 (!) 117/59  Pulse: 99 96  Resp: 17 18  Temp: 36.6 C   SpO2: 97% 99%    Last Pain:  Vitals:   05/22/18 0950  TempSrc: Oral  PainSc: 0-No pain                 Quintasia Theroux,W. EDMOND

## 2018-05-22 NOTE — Op Note (Addendum)
[facility] Patient Name: Margaret ArgueGeraldine Ward Procedure Date : 05/22/2018 MRN: [id number] Attending MD: Kathi DerParag Alfard Cochrane , MD Date of Birth: 15-Jul-1941 CSN: [id number] Age: 77 Admit Type: Inpatient Procedure:                Upper GI endoscopy Indications:              Melena Providers:                Kathi DerParag Denesia Donelan, MD, Leandrew KoyanagiStacey Duininck, RN, Verita SchneidersPrasun                            Sharma, Technician, Dairl PonderFudan Jiang, CRNA Referring MD:              Medicines:                Sedation Administered by an Anesthesia Professional Complications:            No immediate complications. Estimated Blood Loss:     Estimated blood loss was minimal. Procedure:                Pre-Anesthesia Assessment:                           - Prior to the procedure, a History and Physical                            was performed, and patient medications and                            allergies were reviewed. The patient's tolerance of                            previous anesthesia was also reviewed. The risks                            and benefits of the procedure and the sedation                            options and risks were discussed with the patient.                            All questions were answered, and informed consent                            was obtained. Prior Anticoagulants: The patient has                            taken Plavix (clopidogrel), last dose was day of                            procedure. ASA Grade Assessment: III - A patient                            with severe systemic disease. After reviewing the  risks and benefits, the patient was deemed in                            satisfactory condition to undergo the procedure.                           After obtaining informed consent, the endoscope was                            passed under direct vision. Throughout the                            procedure, the patient's blood pressure, pulse, and                          oxygen saturations were monitored continuously. The                            EG-[id number] ([id number]) scope was introduced through the                            mouth, and advanced to the second part of duodenum.                            The upper GI endoscopy was accomplished without                            difficulty. The patient tolerated the procedure                            well. Scope In: Scope Out: Findings:      The Z-line was irregular and was found 25 cm from the incisors. short       segment Barrett's esophagus noted.      A large hiatal hernia ( >10 cm) with a single large Cameron ulcer was       found. ulcer was clean based with a small pigmented spot and located at       30 cm. No evidence of active bleeding. Biopsy from ulcer edge was taken       with a cold forceps for histology.      Normal mucosa was found in the entire examined stomach. Biopsies were       taken with a cold forceps for histology. Estimated blood loss was       minimal.      The duodenal bulb, first portion of the duodenum and second portion of       the duodenum were normal. Impression:               - Z-line irregular, 25 cm from the incisors.                           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                            Biopsied.                           -  Normal mucosa was found in the entire stomach.                            Biopsied.                           - Normal duodenal bulb, first portion of the                            duodenum and second portion of the duodenum. Recommendation:           - Return patient to hospital ward for ongoing care.                           - Cardiac diet.                           - Continue present medications.                           - No aspirin, ibuprofen, naproxen, or other                            non-steroidal anti-inflammatory drugs.                           - Await pathology results.                            - Repeat upper endoscopy at appointment to be                            scheduled to check healing.                           - Return to my office in 2 months. Procedure Code(s):        --- Professional ---                           [phone], Esophagogastroduodenoscopy, flexible,                            transoral; with biopsy, single or multiple Diagnosis Code(s):        --- Professional ---                           K22.8, Other specified diseases of esophagus                           K44.9, Diaphragmatic hernia without obstruction or                            gangrene                           K25.9, Gastric ulcer, unspecified as acute or  chronic, without hemorrhage or perforation                           K92.1, Melena (includes Hematochezia) CPT copyright 2017 American [employer] Association. All rights reserved. The codes documented in this report are preliminary and upon coder review may  be revised to meet current compliance requirements. Kathi Der, MD Kathi Der, MD 05/22/2018 9:51:00 AM Number of Addenda: 0

## 2018-05-22 NOTE — Progress Notes (Addendum)
Initial Nutrition Assessment  DOCUMENTATION CODES:   Obesity unspecified  INTERVENTION:   - Glucerna Shake po BID, each supplement provides 220 kcal and 10 grams of protein (pt prefers chocolate flavor)  NUTRITION DIAGNOSIS:   Inadequate oral intake related to decreased appetite, diarrhea as evidenced by per patient/family report.  GOAL:   Patient will meet greater than or equal to 90% of their needs  MONITOR:   PO intake, Supplement acceptance, Diet advancement, I & O's, Labs, Weight trends  REASON FOR ASSESSMENT:   Malnutrition Screening Tool    ASSESSMENT:   77 year old female who presented to the ED with SOB and melena. PMH significant for CAD s/p recent CABG x 3 and subsequent stent placement, recent NSTEMI, type 2 diabetes mellitus, CHF, hypertension, CKD stage III, and hyperlipidemia.  7/6 - s/p EGD with findings of large hiatal hernia with single large Cameron ulcer  Spoke with pt at bedside who reports typically having a good appetite except over the past week since she has not been feeling well. Pt reports feeling "woozy" on Thursday evening after her shower then waking up Friday and having several "dark" loose stools.  Pt reports that her appetite is improving. Pt is glad to be on a soft diet and was consuming graham crackers at time of visit. Pt would like to receiving oral nutrition supplement during admission. RD to order Glucerna BID as pt with type 2 diabetes mellitus.  Pt reports typically eating 3 meals daily and drinking flavored water as she does not like the taste of regular water. A typical breakfast includes cereal or an English muffin or toast. A typical lunch includes a "heart healthy" soup. A typical dinner is usually "ordered out." Pt states she had macaroni salad, potato salad, baked beans, and a burger on the Fourth of July.  Pt endorses weight loss due to "heart problems" and being sick. Pt states that her UBW is 209 lbs and that she has lost 30  lbs. RD obtained bed weight at time of visit: 177.1 lbs. Weight history in chart is limited. However, pt has lost 11.2 lbs in less than 3 months. This is a 6% weight loss which is not significant for timeframe.  Medications reviewed and include: sliding scale Novolog, Protonix  Labs reviewed: chloride 113 (H), BUN 74 (H), creatinine 1.33 (H), hemoglobin 9.8 (L), HCT 30.7 (L) CBG's: 181, 127, 176, 160  NUTRITION - FOCUSED PHYSICAL EXAM:    Most Recent Value  Orbital Region  Moderate depletion  Upper Arm Region  No depletion  Thoracic and Lumbar Region  No depletion  Buccal Region  Mild depletion  Temple Region  No depletion  Clavicle Bone Region  No depletion  Clavicle and Acromion Bone Region  Mild depletion  Scapular Bone Region  Unable to assess  Dorsal Hand  No depletion  Patellar Region  No depletion  Anterior Thigh Region  No depletion  Posterior Calf Region  No depletion  Edema (RD Assessment)  None  Hair  Reviewed  Eyes  Reviewed  Mouth  Reviewed  Skin  Reviewed  Nails  Reviewed       Diet Order:   Diet Order           DIET SOFT Room service appropriate? Yes; Fluid consistency: Thin  Diet effective now          EDUCATION NEEDS:   No education needs have been identified at this time  Skin:  Skin Assessment: Reviewed RN Assessment  Last BM:  05/21/18  Height:   Ht Readings from Last 1 Encounters:  05/21/18 5\' 2"  (1.575 m)    Weight:   Wt Readings from Last 1 Encounters:  05/21/18 173 lb (78.5 kg)    Ideal Body Weight:  50 kg  BMI:  Body mass index is 31.64 kg/m.  Estimated Nutritional Needs:   Kcal:  1500-1700 kcal/day  Protein:  80-95 grams/day  Fluid:  1.5 L/day    Earma ReadingKate Jablonski Marlane Hirschmann, MS, RD, LDN Pager: [phone] Weekend/After Hours: [phone]

## 2018-05-22 NOTE — Anesthesia Preprocedure Evaluation (Addendum)
Anesthesia Evaluation  Patient identified by MRN, date of birth, ID band Patient awake    Reviewed: Allergy & Precautions, H&P , NPO status , Patient's Chart, lab work & pertinent test results  Airway Mallampati: II  TM Distance: >3 FB Neck ROM: Full    Dental no notable dental hx. (+) Teeth Intact, Dental Advisory Given   Pulmonary sleep apnea and Oxygen sleep apnea , former smoker,    Pulmonary exam normal breath sounds clear to auscultation       Cardiovascular hypertension, + CAD, + Past MI, + CABG and +CHF   Rhythm:Regular Rate:Normal     Neuro/Psych  Headaches, negative psych ROS   GI/Hepatic Neg liver ROS, GERD  Medicated and Controlled,  Endo/Other  diabetes, Insulin Dependent  Renal/GU Renal disease  negative genitourinary   Musculoskeletal  (+) Arthritis , Osteoarthritis,    Abdominal   Peds  Hematology negative hematology ROS (+) anemia ,   Anesthesia Other Findings   Reproductive/Obstetrics negative OB ROS                            Anesthesia Physical Anesthesia Plan  ASA: III  Anesthesia Plan: MAC   Post-op Pain Management:    Induction: Intravenous  PONV Risk Score and Plan: 2 and Ondansetron and Propofol infusion  Airway Management Planned: Nasal Cannula  Additional Equipment:   Intra-op Plan:   Post-operative Plan:   Informed Consent: I have reviewed the patients History and Physical, chart, labs and discussed the procedure including the risks, benefits and alternatives for the proposed anesthesia with the patient or authorized representative who has indicated his/her understanding and acceptance.   Dental advisory given  Plan Discussed with: CRNA  Anesthesia Plan Comments:         Anesthesia Quick Evaluation

## 2018-05-22 NOTE — Progress Notes (Addendum)
[facility] PROGRESS NOTE  Margaret Ward:[id number] DOB: [birth year] DOA: 05/21/2018 PCP: Sharlene Dory, DO  Narrative: 40 fem significant cardiac history recently had a CABG in March 2019, postop was complicated by graft occlusion requiring drug-eluting stent placement in April 2019, currently on dual antiplatelet therapy, also history of hypertension, sleep apnea, diabetes mellitus, chronic combined systolic and diastolic CHF who comes to the hospital with right lower quadrant abdominal pain that started over the last day as well as several large black stools associated with significant lightheadedness weakness and dizziness.  She was found to have worsening hemoglobin compared to her baseline, fecal occult was positive.  Cardiology and GI consulted.   A & Plan Acute GIB-Cameron ulcer-appreciate GI input-diet grad as per GI--Hemoglobin stable after 2 U prbc CAD CABG + DES 2 mo prior-cont plavix-ASa in 1 week.  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 as well--needs OP Urology input if no improvement in kidney function Gout-cont allopruinol 100 mg daily Anemia-cont ferrous sulph in 24 hours   DVT prophylaxis: scd  Code Status: full   Family Communication: d/w daughter briefly   Disposition Plan: Edson Snowball, MD  [facility] Direct contact: [phone] --Via amion app OR  --[email or web address]; password [email or web address]  7PM-7AM contact night coverage as above 05/22/2018, 2:23 PM  LOS: 1 day   Consultants:  Cards  gi  Procedures:  Endo 7/6  Antimicrobials:  none  Interval history/Subjective: Awake alert about to go for procedure no distress No furthe rbleed No cp  Objective:  Vitals:  Vitals:   05/22/18 1021 05/22/18 1332  BP: (!) 113/55 126/64  Pulse: 92 (!) 115  Resp:  16   Temp: 97.8 F (36.6 C) 98.1 F (36.7 C)  SpO2: 100% 100%    Exam:  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 No findings identified to explain patient's diarrhea. 2. Bilateral polycystic kidneys with extensive scarring and cortical volume loss. 3. 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 Aortic atherosclerosis and coronary artery atherosclerotic calcifications. Aortic Atherosclerosis (ICD10-I70.0). 5. Large hiatal hernia.   Medical tests:  -   Test discussed with performing physician:  -  Decision to obtain old records:  -  Review and summation of old records:  -  Scheduled Meds: . allopurinol  100 mg Oral Daily  . atorvastatin  40 mg Oral QPM  . clopidogrel  75 mg Oral Daily  . feeding supplement (GLUCERNA SHAKE)  237 mL Oral BID WC  . insulin aspart  0-15 Units Subcutaneous TID WC  . insulin aspart  0-5 Units Subcutaneous QHS  . pantoprazole (PROTONIX) IV  40 mg Intravenous Q12H  . sodium chloride flush  3 mL Intravenous Q12H   Continuous Infusions: . sodium chloride 100 mL/hr at 05/22/18 0305  . sodium chloride 20 mL/hr at 05/22/18 1158    Principal Problem:   Symptomatic anemia Active Problems:   Hypertension   CAD/s/p CABG Mar 2019 w/ DES to vein graft Apr 2019   GERD (gastroesophageal reflux disease)   Type II  diabetes mellitus (HCC)   Chronic combined systolic and diastolic heart failure (HCC)   Heme positive stool   HLD (hyperlipidemia)   Sleep apnea   Chronic hypoxemic respiratory failure (HCC)   CKD (chronic kidney disease), stage III (HCC)   LOS: 1 day

## 2018-05-22 NOTE — Transfer of Care (Signed)
Immediate Anesthesia Transfer of Care Note  Patient: Margaret Ward  Procedure(s) Performed: ESOPHAGOGASTRODUODENOSCOPY (EGD) WITH PROPOFOL (N/A ) BIOPSY  Patient Location: Endoscopy Unit  Anesthesia Type:MAC  Level of Consciousness: awake, alert  and oriented  Airway & Oxygen Therapy: Patient Spontanous Breathing  Post-op Assessment: Report given to RN and Post -op Vital signs reviewed and stable  Post vital signs: Reviewed and stable  Last Vitals:  Vitals Value Taken Time  BP 105/50 05/22/2018  9:50 AM  Temp    Pulse 101 05/22/2018  9:50 AM  Resp 17 05/22/2018  9:50 AM  SpO2 98 % 05/22/2018  9:50 AM  Vitals shown include unvalidated device data.  Last Pain:  Vitals:   05/22/18 0844  TempSrc: Oral  PainSc: 0-No pain         Complications: No apparent anesthesia complications

## 2018-05-22 NOTE — Brief Op Note (Signed)
05/21/2018 - 05/22/2018  9:51 AM  PATIENT:  Margaret Ward  77 y.o. female  PRE-OPERATIVE DIAGNOSIS:  Melena  POST-OPERATIVE DIAGNOSIS:  hiatial hernia with  ulcer  PROCEDURE:  Procedure(s): ESOPHAGOGASTRODUODENOSCOPY (EGD) WITH PROPOFOL (N/A) BIOPSY  SURGEON:  Surgeon(s) and Role:    * Adarsh Mundorf, MD - Primary   Findings ----------- - A large hiatal hernia ( >10 cm) with a single large Cameron ulcer was       found. ulcer was clean based with a small pigmented spot and located at       30 cm. No evidence of active bleeding. Biopsy from ulcer edge was taken       with a cold forceps for histology.   Recommendations -------------------------- - Continue IV twice a day PPI while in the hospital, switch to by mouth Protonix 40 mg twice a day for next 8 weeks after discharge. - Recommend to hold aspirin for 1-2 weeks if okay with cardiology. Okay to continue Plavix from GI standpoint. - Avoid NSAIDs - Monitor H&H. - Okay to discharge tomorrow from GI standpoint if hemoglobin remains stable. - Follow-up in GI clinic in 2 months. - Recommend repeat EGD as an outpatient to document healing. Timing to be determined based on underlying cardiac history. - GI will sign off. Call us back if needed  Kathi DerParag Kollyn Lingafelter MD, FACP 05/22/2018, 9:53 AM  Contact #  [phone]

## 2018-05-22 NOTE — Progress Notes (Signed)
[facility] Progress Note  Memory ArgueGeraldine Wisecup 77 y.o. Sep 14, 1941  CC:   GI bleed   Subjective: patient had 1 episode of black tarry stool this morning. Denied nausea vomiting. Bilateral lower abdominal discomfort improving.  ROS : negative for active chest pain and shortness of breath.   Objective: Vital signs in last 24 hours: Vitals:   05/22/18 0304 05/22/18 0844  BP: 135/68 140/76  Pulse: 95 (!) 116  Resp: 18 13  Temp: 98.2 F (36.8 C) 98.4 F (36.9 C)  SpO2: 99% 99%    Physical Exam:  Gen. Alert/oriented 3. Not in acute distress Heart . Tachycardia Abdomen. Soft, bilateral lower quadrant discomfort. No peritoneal signs. Bowel sounds present.  Lab Results: Recent Labs    05/21/18 0958 05/22/18 0653  NA 141 142  K 4.8 3.8  CL 107 113*  CO2 23 22  GLUCOSE 221* 144*  BUN 88* 74*  CREATININE 1.49* 1.33*  CALCIUM 8.2* 8.2*   Recent Labs    05/21/18 0958 05/22/18 0653  AST 18 16  ALT 17 16  ALKPHOS 71 58  BILITOT 0.5 0.8  PROT 5.4* 4.8*  ALBUMIN 2.8* 2.7*   Recent Labs    05/21/18 0958 05/21/18 1528 05/22/18 0653  WBC 14.2* 9.5 7.4  NEUTROABS 11.7*  --   --   HGB 9.2* 8.0* 9.8*  HCT 30.4* 26.3* 30.7*  MCV 98.7 98.9 93.3  PLT 193 175 144*   No results for input(s): LABPROT, INR in the last 72 hours.    Assessment/Plan: - melena in setting of aspirin and Plavix use as well as CT scan showing large hiatal hernia. Differential diagnosis would be Sheria Langameron ulcers versus gastritis versus ulcer disease. - Coronary artery disease status post CABG followed by NSTEMI requiring PCI and stent placement in April 2019 - acute blood loss anemia  Recommendations --------------------------- - appreciate cardiology input. Aspirin currently on hold. Currently on Plavix because of recent cardiac events. - EGD today. Patient and  family were informed that if there is a big ulcer,I would not be able to do therapeutic intervention because she is on  Plavix. We will consider interventional radiology guided embolization if there is any active bleeding large ulcer.  Risks (bleeding, infection, bowel perforation that could require surgery, sedation-related changes in cardiopulmonary systems), benefits (identification and possible treatment of source of symptoms, exclusion of certain causes of symptoms), and alternatives (watchful waiting, radiographic imaging studies, empiric medical treatment)  were explained to patient and family in detail and patient wishes to proceed.    Kathi DerParag Claryce Friel MD, FACP 05/22/2018, 8:51 AM  Contact #  [phone]

## 2018-05-22 NOTE — Progress Notes (Signed)
Progress Note  Patient Name: Margaret Ward Date of Encounter: 05/22/2018  Primary Cardiologist: Margaret HerrlichBrian Munley, MD   Subjective   Denies chest pain, palpitations, abdominal pain, and shortness of breath. Wants breakfast. EGD showed large hiatal hernia with a single large Cameron ulcer with no active bleeding.  Inpatient Medications    Scheduled Meds: . allopurinol  100 mg Oral Daily  . atorvastatin  40 mg Oral QPM  . clopidogrel  75 mg Oral Daily  . insulin aspart  0-15 Units Subcutaneous TID WC  . insulin aspart  0-5 Units Subcutaneous QHS  . pantoprazole (PROTONIX) IV  40 mg Intravenous Q12H  . sodium chloride flush  3 mL Intravenous Q12H   Continuous Infusions: . sodium chloride 100 mL/hr at 05/22/18 0305  . sodium chloride     PRN Meds: acetaminophen **OR** acetaminophen, albuterol, ondansetron **OR** ondansetron (ZOFRAN) IV   Vital Signs    Vitals:   05/22/18 0844 05/22/18 0950 05/22/18 1000 05/22/18 1021  BP: 140/76 (!) 105/50 (!) 117/59 (!) 113/55  Pulse: (!) 116 99 96 92  Resp: 13 17 18 16   Temp: 98.4 F (36.9 C) 97.9 F (36.6 C)  97.8 F (36.6 C)  TempSrc: Oral Oral  Oral  SpO2: 99% 97% 99% 100%  Weight:      Height:        Intake/Output Summary (Last 24 hours) at 05/22/2018 1111 Last data filed at 05/22/2018 1028 Gross per 24 hour  Intake 2537.85 ml  Output 400.5 ml  Net 2137.35 ml   Filed Weights   05/21/18 0953  Weight: 173 lb (78.5 kg)    Telemetry    Sinus rhythm and sinus tachycardia, occasional PAC's - Personally Reviewed  ECG    No new tracings - Personally Reviewed  Physical Exam   GEN: No acute distress.   Neck: No JVD Cardiac: RRR, no murmurs, rubs, or gallops.  Respiratory: Clear to auscultation bilaterally. GI: Soft, nontender, non-distended  MS: No edema; No deformity. Neuro:  Nonfocal  Psych: Normal affect   Labs    Chemistry Recent Labs  Lab 05/21/18 0958 05/22/18 0653  NA 141 142  K 4.8 3.8  CL 107 113*    CO2 23 22  GLUCOSE 221* 144*  BUN 88* 74*  CREATININE 1.49* 1.33*  CALCIUM 8.2* 8.2*  PROT 5.4* 4.8*  ALBUMIN 2.8* 2.7*  AST 18 16  ALT 17 16  ALKPHOS 71 58  BILITOT 0.5 0.8  GFRNONAA 33* 37*  GFRAA 38* 43*  ANIONGAP 11 7     Hematology Recent Labs  Lab 05/21/18 0958 05/21/18 1528 05/22/18 0653  WBC 14.2* 9.5 7.4  RBC 3.08* 2.66* 3.29*  HGB 9.2* 8.0* 9.8*  HCT 30.4* 26.3* 30.7*  MCV 98.7 98.9 93.3  MCH 29.9 30.1 29.8  MCHC 30.3 30.4 31.9  RDW 14.2 14.6 14.6  PLT 193 175 144*    Cardiac Enzymes Recent Labs  Lab 05/21/18 1528 05/22/18 0653  TROPONINI <0.03 0.03*    Recent Labs  Lab 05/21/18 1010  TROPIPOC 0.00     BNP Recent Labs  Lab 05/21/18 0958  BNP 68.8     DDimer No results for input(s): DDIMER in the last 168 hours.   Radiology    Dg Chest 2 View  Result Date: 05/21/2018 CLINICAL DATA:  Chest and abdominal pain. EXAM: CHEST - 2 VIEW COMPARISON:  03/08/2018.  CT 03/01/2018. FINDINGS: Mediastinum hilar structures normal. Prior CABG. Cardiomegaly with normal pulmonary vascularity. Low lung volumes with  mild bibasilar atelectasis. Tiny nodular density noted over the right upper lung is unchanged from prior CT of 03/01/2018. Reference is made to that report for further recommendations. No pleural effusion or pneumothorax. Hiatal hernia noted. IMPRESSION: 1. Low lung volumes with mild bibasilar atelectasis. Tiny nodular density again noted over the right upper lung. Tiny nodular density again noted over the right upper lung, unchanged from prior CT of 03/01/2018. Reference is made to prior CT report for further discussion. 2.  Prior CABG.  Cardiomegaly.  No pulmonary venous congestion. 3.  Hiatal hernia noted. Electronically Signed   By: Maisie Fus  Register   On: 05/21/2018 10:52   Ct Abdomen Pelvis W Contrast  Result Date: 05/21/2018 CLINICAL DATA:  Diarrhea. EXAM: CT ABDOMEN AND PELVIS WITH CONTRAST TECHNIQUE: Multidetector CT imaging of the abdomen and  pelvis was performed using the standard protocol following bolus administration of intravenous contrast. CONTRAST:  80mL OMNIPAQUE IOHEXOL 300 MG/ML  SOLN COMPARISON:  CT chest 03/01/2018. FINDINGS: Lower chest: Scarring noted within the lung bases. No pleural effusion. Calcification within the RCA coronary artery noted. Hepatobiliary: No focal liver abnormality is seen. Status post cholecystectomy. No biliary dilatation. Pancreas: Unremarkable. No pancreatic ductal dilatation or surrounding inflammatory changes. Spleen: Normal in size without focal abnormality. Adrenals/Urinary Tract: Normal appearance of the adrenal glands. Multiple bilateral kidney cysts are identified. Extensive renal cortical scarring and atrophy noted. There is a large left-sided staghorn calculus identified which measures approximately 8 cm in cranial caudal dimension. Urinary bladder appears normal. Stomach/Bowel: Large hiatal hernia identified. Stomach otherwise unremarkable. The small bowel loops have a normal course and caliber. The colon appears collapsed. No definite colonic inflammation identified. Vascular/Lymphatic: Aortic atherosclerosis without aneurysm. No abdominal or pelvic adenopathy. No inguinal adenopathy. Reproductive: Status post hysterectomy. No adnexal masses. Other: No free fluid or fluid collections. Musculoskeletal: Degenerative disc disease noted throughout the lumbar spine. Anterolisthesis of L4 on L5 is identified. No suspicious bone lesions identified. IMPRESSION: 1. No findings identified to explain patient's diarrhea. 2. Bilateral polycystic kidneys with extensive scarring and cortical volume loss. 3. 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 Aortic atherosclerosis and coronary artery atherosclerotic calcifications. Aortic Atherosclerosis (ICD10-I70.0). 5. Large hiatal hernia. Electronically Signed   By: Signa Kell M.D.   On: 05/21/2018 13:29    Cardiac Studies   Echo (03/02/18):  Study  Conclusions  - Left ventricle: The cavity size was normal. There was mild   concentric hypertrophy. Systolic function was mildly to   moderately reduced. The estimated ejection fraction was in the   range of 40% to 45%. Mild diffuse hypokinesis. There was an   increased relative contribution of atrial contraction to   ventricular filling. Doppler parameters are consistent with   abnormal left ventricular relaxation (grade 1 diastolic   dysfunction). - Mitral valve: Calcified annulus. There was mild regurgitation. - Left atrium: The atrium was moderately to severely dilated. - Right atrium: The atrium was moderately dilated.   Cath (03/02/18):   Prox LAD lesion is 100% stenosed.  Ost 1st Diag lesion is 95% stenosed.  Ost Ramus to Ramus lesion is 30% stenosed.  LIMA graft was visualized by angiography and is normal in caliber.  Origin lesion is 100% stenosed.  SVG graft was visualized by angiography and is normal in caliber.  Mid LM to Dist LM lesion is 40% stenosed.  Ost RPDA lesion is 99% stenosed.  Prox RCA to Mid RCA lesion is 40% stenosed.  A drug-eluting stent was successfully placed using a  STENT SIERRA 2.25 X 15 MM.  Post intervention, there is a 0% residual stenosis.   1. 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 The LAD is occluded proximally just beyond the takeoff of the early Diagonal branch. The entire LAD fills from the patent LIMA graft. The Diagonal branch (intermediate branch) has a 95% proximal stenosis. The vein graft that presumably went to this branch is occluded.  3. Successful PTCA/DES x 1 Diagonal (intermediate branch).  4. The Circumflex and another intermediate branch are small to moderate in caliber and have minor plaque.  5. The RCA is a large dominant vessel. There is diffuse 40% stenosis in the proximal/mid and distal RCA. The PDA is small to moderate in caliber. The ostium of the PDA has a 99% stenosis. The PDA  fills from the patent vein graft.   Recommendations: Continue DAPT with ASA and Plavix for at least one year. Continue statin and beta blocker. Echo to assess LV function.    Patient Profile     77 y.o. 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  Assessment & Plan    1. CAD with CABG and recent DES: Symptomatically stable. Ok to hold ASA for 1-2 additional weeks (ideally 1 week if possible). As we are still within 3 month window of drug-eluting stent (placed 03/02/18), there is still an increased risk of stent thrombosis, albeit the stented vessel is small. Continue Plavix. P2Y12 assay shows good platelet inhibition (50). Resume ASA in 1-2 weeks. Continue Lipitor. Troponins are nonspecifically elevated at insignificant (0.03). Given LV dysfunction, would not resume diltiazem given negative inotropic effect.  Did not tolerate Toprol or acebutolol but this occurred in the setting of an acute respiratory issue. Would reattempt Toprol-Xl 25 mg at time of discharge.  2. Chronic combined systolic and diastolic heart failure: Euvolemic. Can resume Lasix 40 mg at time of discharge. Taken off ACEI 03/09/18 due to intense coughing in setting of recently tx PNA. Once over acute issues may be able to re-challenge with ARB. Given LV dysfunction, would not resume diltiazem given negative inotropic effect. BNP normal. Did not tolerate Toprol or acebutolol but this occurred in the setting of an acute respiratory issue. Would reattempt Toprol-Xl 25 mg at time of discharge.   3. Hypotension with hx of HTN: BP normal. BP agents on hold. See discussion in #1 and 2.  4. Acute  GI bleed with symptomatic anemia: EGD showed large hiatal hernia with a single large Cameron ulcer with no active bleeding. See discussion in #1 regarding ASA and Plavix. Hgb stable at 9.8 today.  5. CKD stage III   [facility] will sign off.   Medication Recommendations:  See discussion above Other recommendations (labs, testing, etc):  No testing indicated Follow up as an outpatient:  1-2 weeks in office  For questions or updates, please contact [facility] Please consult [email or web address] for contact info under Cardiology/STEMI.      Signed, Prentice Docker, MD  05/22/2018, 11:11 AM

## 2018-05-22 NOTE — Progress Notes (Signed)
Blood transfusion ongoing, Troponin blood draw held. Informed phlebotomist.

## 2018-05-23 ENCOUNTER — Inpatient Hospital Stay (HOSPITAL_COMMUNITY): Payer: Medicare Other

## 2018-05-23 ENCOUNTER — Encounter (HOSPITAL_COMMUNITY): Payer: Self-pay | Admitting: Gastroenterology

## 2018-05-23 LAB — RENAL FUNCTION PANEL
ALBUMIN: 2.4 g/dL — AB (ref 3.5–5.0)
Anion gap: 6 (ref 5–15)
BUN: 65 mg/dL — ABNORMAL HIGH (ref 8–23)
CHLORIDE: 117 mmol/L — AB (ref 98–111)
CO2: 19 mmol/L — ABNORMAL LOW (ref 22–32)
Calcium: 7.9 mg/dL — ABNORMAL LOW (ref 8.9–10.3)
Creatinine, Ser: 1.31 mg/dL — ABNORMAL HIGH (ref 0.44–1.00)
GFR calc Af Amer: 44 mL/min — ABNORMAL LOW (ref >60)
GFR, EST NON AFRICAN AMERICAN: 38 mL/min — AB (ref >60)
GLUCOSE: 229 mg/dL — AB (ref 70–99)
Phosphorus: 4.3 mg/dL (ref 2.5–4.6)
Potassium: 4.8 mmol/L (ref 3.5–5.1)
Sodium: 142 mmol/L (ref 135–145)

## 2018-05-23 LAB — HEMOGLOBIN AND HEMATOCRIT, BLOOD
HCT: 23.7 % — ABNORMAL LOW (ref 36.0–46.0)
HCT: 26.9 % — ABNORMAL LOW (ref 36.0–46.0)
HEMOGLOBIN: 8.5 g/dL — AB (ref 12.0–15.0)
Hemoglobin: 7.5 g/dL — ABNORMAL LOW (ref 12.0–15.0)

## 2018-05-23 LAB — CBC WITH DIFFERENTIAL/PLATELET
ABS IMMATURE GRANULOCYTES: 0 10*3/uL (ref 0.0–0.1)
Basophils Absolute: 0.1 10*3/uL (ref 0.0–0.1)
Basophils Relative: 1 %
Eosinophils Absolute: 0.3 10*3/uL (ref 0.0–0.7)
Eosinophils Relative: 4 %
HCT: 23.3 % — ABNORMAL LOW (ref 36.0–46.0)
HEMOGLOBIN: 7.4 g/dL — AB (ref 12.0–15.0)
IMMATURE GRANULOCYTES: 0 %
LYMPHS PCT: 25 %
Lymphs Abs: 1.8 10*3/uL (ref 0.7–4.0)
MCH: 31.1 pg (ref 26.0–34.0)
MCHC: 31.8 g/dL (ref 30.0–36.0)
MCV: 97.9 fL (ref 78.0–100.0)
MONO ABS: 0.4 10*3/uL (ref 0.1–1.0)
MONOS PCT: 6 %
NEUTROS ABS: 4.6 10*3/uL (ref 1.7–7.7)
NEUTROS PCT: 64 %
PLATELETS: 125 10*3/uL — AB (ref 150–400)
RBC: 2.38 MIL/uL — ABNORMAL LOW (ref 3.87–5.11)
RDW: 15.3 % (ref 11.5–15.5)
WBC: 7.2 10*3/uL (ref 4.0–10.5)

## 2018-05-23 LAB — GLUCOSE, CAPILLARY
GLUCOSE-CAPILLARY: 181 mg/dL — AB (ref 70–99)
GLUCOSE-CAPILLARY: 188 mg/dL — AB (ref 70–99)
Glucose-Capillary: 200 mg/dL — ABNORMAL HIGH (ref 70–99)
Glucose-Capillary: 176 mg/dL — ABNORMAL HIGH (ref 70–99)

## 2018-05-23 LAB — PREPARE RBC (CROSSMATCH)

## 2018-05-23 MED ORDER — TECHNETIUM TC 99M TETROFOSMIN IV KIT
10.0000 | PACK | Freq: Once | INTRAVENOUS | Status: AC | PRN
  Administered 2018-05-23: 10 via INTRAVENOUS

## 2018-05-23 MED ORDER — METOPROLOL TARTRATE 12.5 MG HALF TABLET
12.5000 mg | ORAL_TABLET | Freq: Two times a day (BID) | ORAL | Status: DC
  Administered 2018-05-23 – 2018-05-27 (×9): 12.5 mg via ORAL
  Filled 2018-05-23 (×9): qty 1

## 2018-05-23 MED ORDER — SODIUM CHLORIDE 0.9% IV SOLUTION: Freq: Once | INTRAVENOUS | Status: DC

## 2018-05-23 MED ORDER — REGADENOSON 0.4 MG/5ML IV SOLN
INTRAVENOUS | Status: AC
  Filled 2018-05-23: qty 5

## 2018-05-23 MED ORDER — REGADENOSON 0.4 MG/5ML IV SOLN
0.4000 mg | Freq: Once | INTRAVENOUS | Status: DC
  Filled 2018-05-23: qty 5

## 2018-05-23 MED ORDER — DIPHENHYDRAMINE HCL 25 MG PO CAPS
25.0000 mg | ORAL_CAPSULE | Freq: Once | ORAL | Status: AC
  Administered 2018-05-23: 25 mg via ORAL
  Filled 2018-05-23: qty 1

## 2018-05-23 MED ORDER — FUROSEMIDE 10 MG/ML IJ SOLN
20.0000 mg | Freq: Once | INTRAMUSCULAR | Status: AC
  Administered 2018-05-23: 20 mg via INTRAVENOUS
  Filled 2018-05-23: qty 2

## 2018-05-23 NOTE — Plan of Care (Signed)
  Problem: Clinical Measurements: Goal: Ability to maintain clinical measurements within normal limits will improve Outcome: Progressing   Problem: Pain Managment: Goal: General experience of comfort will improve Outcome: Progressing   

## 2018-05-23 NOTE — Progress Notes (Signed)
[facility] PROGRESS NOTE  Margaret Ward ZHY:[id number] DOB: [birth year] DOA: 05/21/2018 PCP: Sharlene Dory, DO  Narrative: 28 fem significant cardiac history recently had a CABG in March 2019, postop was complicated by graft occlusion requiring drug-eluting stent placement in April 2019, currently on dual antiplatelet therapy, also history of hypertension, sleep apnea, diabetes mellitus, chronic combined systolic and diastolic CHF who comes to the hospital with right lower quadrant abdominal pain that started over the last day as well as several large black stools associated with significant lightheadedness weakness and dizziness.  She was found to have worsening hemoglobin compared to her baseline, fecal occult was positive.  Cardiology and GI consulted.   A & Plan Acute GIB-Cameron ulcer-appreciate GI input-diet grad as per GI.  Acute blood loss anemia secondary to ulcer as above-give 1 more unit of packed red blood cells-already has received 2 units 7/5, continue ferrous sulfate  CAD CABG + DES 2 mo prior-cont plavix-ASa in 1 week pendant on trends.  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  Gout-cont allopruinol 100 mg daily    DVT prophylaxis: scd  Code Status: full   Family Communication: d/w daughter briefly   Disposition Plan: Edson Snowball, MD  [facility] Direct contact: [phone] --Via amion app OR  --[email or web address]; password [email or web address]  7PM-7AM contact night coverage as above 05/23/2018, 10:20 AM  LOS: 2 days   Consultants:  Cards  gi  Procedures:  Endo 7/6  Antimicrobials:  none  Interval history/Subjective:  Seems to have gone down for an unscheduled stress test however is now back and it appears the stress test was canceled Has not had a stool since yesterday however is winded on minimal exertion No lower extremity swelling Ready to have breakfast No dyspepsia-like symptoms I had a long discussion with her about her ulcer and the fact that gastroenterology may adjust her diet   Objective:  Awake alert very pleasant glass added to the right eye no icterus on the other eye throat is soft supple she does not appear to be in distress S1-S2 no murmur abdomen is soft nontender trace lower extremity edema neurologically intact moving all 4 limbs sensory is intact power is 5/5 chest is clinically clear  Vitals:  Vitals:   05/23/18 0435 05/23/18 0906  BP: 138/68 108/72  Pulse: 98   Resp: 17   Temp: 98.9 F (37.2 C)   SpO2: 92%     Exam:    I have personally reviewed the following:   Labs:  Hemoglobin up to 9.8 post tx from 8.0--- back down to 7.4-transfusing 1 more unit 7/7 Pete labs  plt 144 down from 175--125  Creat 1.49-->1.32---->1.3  Mild hyperchloremia 117-119  Troponin flat 0.03  Imaging studies: CT abd 7/5 IMPRESSION: 1. No findings identified to explain patient's diarrhea. 2. Bilateral polycystic kidneys with extensive scarring and cortical volume loss. 3. 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 Aortic atherosclerosis  and coronary artery atherosclerotic calcifications. Aortic Atherosclerosis (ICD10-I70.0). 5. Large hiatal hernia.   Medical tests:  -   Test discussed with performing physician:  -  Decision to obtain old  records:  -  Review and summation of old records:  -  Scheduled Meds: . sodium chloride   Intravenous Once  . allopurinol  100 mg Oral Daily  . atorvastatin  40 mg Oral QPM  . clopidogrel  75 mg Oral Daily  . diphenhydrAMINE  25 mg Oral Once  . feeding supplement (GLUCERNA SHAKE)  237 mL Oral BID WC  . furosemide  20 mg Intravenous Once  . insulin aspart  0-15 Units Subcutaneous TID WC  . insulin aspart  0-5 Units Subcutaneous QHS  . pantoprazole (PROTONIX) IV  40 mg Intravenous Q12H  . regadenoson      . regadenoson  0.4 mg Intravenous Once  . sodium chloride flush  3 mL Intravenous Q12H   Continuous Infusions: . sodium chloride 20 mL/hr at 05/23/18 0300    Principal Problem:   Symptomatic anemia Active Problems:   Hypertension   CAD/s/p CABG Mar 2019 w/ DES to vein graft Apr 2019   GERD (gastroesophageal reflux disease)   Type II diabetes mellitus (HCC)   Chronic combined systolic and diastolic heart failure (HCC)   Heme positive stool   HLD (hyperlipidemia)   Sleep apnea   Chronic hypoxemic respiratory failure (HCC)   CKD (chronic kidney disease), stage III (HCC)   LOS: 2 days

## 2018-05-24 ENCOUNTER — Inpatient Hospital Stay (HOSPITAL_COMMUNITY): Payer: Medicare Other | Admitting: Anesthesiology

## 2018-05-24 ENCOUNTER — Encounter (HOSPITAL_COMMUNITY): Payer: Self-pay | Admitting: Gastroenterology

## 2018-05-24 ENCOUNTER — Encounter (HOSPITAL_COMMUNITY)
Admission: EM | Disposition: A | Discharge status: Home / Self Care | Payer: Self-pay | Source: Home / Self Care | Attending: Family Medicine

## 2018-05-24 HISTORY — PX: ESOPHAGOGASTRODUODENOSCOPY (EGD) WITH PROPOFOL: SHX5813

## 2018-05-24 HISTORY — PX: SCHLEROTHERAPY: SHX5440

## 2018-05-24 LAB — TYPE AND SCREEN
ABO/RH(D): AB POS
Antibody Screen: NEGATIVE
Unit division: 0
Unit division: 0
Unit division: 0

## 2018-05-24 LAB — BPAM RBC
BLOOD PRODUCT EXPIRATION DATE: 201908022359
BLOOD PRODUCT EXPIRATION DATE: 201908022359
Blood Product Expiration Date: 201908062359
ISSUE DATE / TIME: 201907052008
ISSUE DATE / TIME: 201907071238
ISSUE DATE / TIME: 201907052357
UNIT TYPE AND RH: 8400
Unit Type and Rh: 8400
Unit Type and Rh: 8400

## 2018-05-24 LAB — CBC WITH DIFFERENTIAL/PLATELET
ABS IMMATURE GRANULOCYTES: 0 10*3/uL (ref 0.0–0.1)
Basophils Absolute: 0.1 10*3/uL (ref 0.0–0.1)
Basophils Relative: 1 %
Eosinophils Absolute: 0.5 10*3/uL (ref 0.0–0.7)
Eosinophils Relative: 7 %
HEMATOCRIT: 24.6 % — AB (ref 36.0–46.0)
Hemoglobin: 7.7 g/dL — ABNORMAL LOW (ref 12.0–15.0)
IMMATURE GRANULOCYTES: 0 %
LYMPHS ABS: 2.5 10*3/uL (ref 0.7–4.0)
Lymphocytes Relative: 37 %
MCH: 29.8 pg (ref 26.0–34.0)
MCHC: 31.3 g/dL (ref 30.0–36.0)
MCV: 95.3 fL (ref 78.0–100.0)
MONOS PCT: 8 %
Monocytes Absolute: 0.5 10*3/uL (ref 0.1–1.0)
NEUTROS ABS: 3.2 10*3/uL (ref 1.7–7.7)
NEUTROS PCT: 47 %
PLATELETS: 120 10*3/uL — AB (ref 150–400)
RBC: 2.58 MIL/uL — AB (ref 3.87–5.11)
RDW: 14.8 % (ref 11.5–15.5)
WBC: 6.7 10*3/uL (ref 4.0–10.5)

## 2018-05-24 LAB — BASIC METABOLIC PANEL
ANION GAP: 6 (ref 5–15)
BUN: 47 mg/dL — ABNORMAL HIGH (ref 8–23)
CALCIUM: 8 mg/dL — AB (ref 8.9–10.3)
CO2: 24 mmol/L (ref 22–32)
CREATININE: 1.36 mg/dL — AB (ref 0.44–1.00)
Chloride: 115 mmol/L — ABNORMAL HIGH (ref 98–111)
GFR calc non Af Amer: 36 mL/min — ABNORMAL LOW (ref >60)
GFR, EST AFRICAN AMERICAN: 42 mL/min — AB (ref >60)
GLUCOSE: 156 mg/dL — AB (ref 70–99)
POTASSIUM: 3.6 mmol/L (ref 3.5–5.1)
Sodium: 145 mmol/L (ref 135–145)

## 2018-05-24 LAB — GLUCOSE, CAPILLARY
GLUCOSE-CAPILLARY: 146 mg/dL — AB (ref 70–99)
Glucose-Capillary: 228 mg/dL — ABNORMAL HIGH (ref 70–99)
Glucose-Capillary: 112 mg/dL — ABNORMAL HIGH (ref 70–99)

## 2018-05-24 SURGERY — ESOPHAGOGASTRODUODENOSCOPY (EGD) WITH PROPOFOL
Anesthesia: Monitor Anesthesia Care

## 2018-05-24 MED ORDER — PROPOFOL 500 MG/50ML IV EMUL
INTRAVENOUS | Status: DC | PRN
  Administered 2018-05-24: 100 ug/kg/min via INTRAVENOUS

## 2018-05-24 MED ORDER — SODIUM CHLORIDE 0.9 % IV SOLN
INTRAVENOUS | Status: DC
  Administered 2018-05-24 – 2018-05-26 (×5): via INTRAVENOUS

## 2018-05-24 MED ORDER — SODIUM CHLORIDE 0.9% FLUSH
10.0000 mL | INTRAVENOUS | Status: DC | PRN
  Administered 2018-05-25 – 2018-05-26 (×2): 10 mL
  Administered 2018-05-26: 20 mL
  Administered 2018-05-27: 30 mL
  Filled 2018-05-24 (×4): qty 40

## 2018-05-24 MED ORDER — SODIUM CHLORIDE 0.9 % IV SOLN
8.0000 mg/h | INTRAVENOUS | Status: DC
  Administered 2018-05-24 – 2018-05-27 (×7): 8 mg/h via INTRAVENOUS
  Filled 2018-05-24 (×9): qty 80

## 2018-05-24 MED ORDER — LACTATED RINGERS IV SOLN
INTRAVENOUS | Status: DC | PRN
  Administered 2018-05-24: 12:00:00 via INTRAVENOUS

## 2018-05-24 MED ORDER — PANTOPRAZOLE SODIUM 40 MG IV SOLR
40.0000 mg | Freq: Two times a day (BID) | INTRAVENOUS | Status: DC
  Filled 2018-05-24: qty 40

## 2018-05-24 MED ORDER — PANTOPRAZOLE SODIUM 40 MG IV SOLR
40.0000 mg | Freq: Once | INTRAVENOUS | Status: AC
  Administered 2018-05-24: 40 mg via INTRAVENOUS
  Filled 2018-05-24: qty 40

## 2018-05-24 MED ORDER — SODIUM CHLORIDE 0.9 % IV SOLN: INTRAVENOUS | Status: DC

## 2018-05-24 MED ORDER — SODIUM CHLORIDE 0.9 % IJ SOLN
PREFILLED_SYRINGE | INTRAMUSCULAR | Status: DC | PRN
  Administered 2018-05-24: 1 mL

## 2018-05-24 MED ORDER — PHENYLEPHRINE HCL 10 MG/ML IJ SOLN
INTRAMUSCULAR | Status: DC | PRN
  Administered 2018-05-24: 80 ug via INTRAVENOUS

## 2018-05-24 SURGICAL SUPPLY — 14 items

## 2018-05-24 NOTE — Progress Notes (Signed)
[facility] PROGRESS NOTE  Margaret Ward ZOX:[id number] DOB: [birth year] DOA: 05/21/2018 PCP: Sharlene Dory, DO  Narrative:  74 significant cardiac history recently had a CABG in March 2019, postop was complicated by graft occlusion requiring drug-eluting stent placement in April 2019, currently on dual antiplatelet therapy, also history of hypertension, sleep apnea, diabetes mellitus, chronic combined systolic and diastolic CHF who comes to the hospital with right lower quadrant abdominal pain that started over the last day as well as several large black stools associated with significant lightheadedness weakness and dizziness.  She was found to have worsening hemoglobin compared to her baseline, fecal occult was positive.  Cardiology and GI consulted.   A & Plan Acute GIB-Cameron ulcer, hemoglobin has dropped see below discussion-Dr. brahmbatt is aware--I have started Protonix GTT and kept the patient n.p.o. for the time being  Acute blood loss anemia secondary to ulcer as above-await for repeat blood count from this morning to confirm her hemoglobin truly is 7.7 despite transfusions-history suggestive of melena still as she has ulcer as above She has received a total of 3 units PRBC-I will type and screen her-she has poor IV access and will need a midline  CAD CABG + DES 2 mo prior-cont plavix-ASa resumption as per gi?Marland Kitchen  Troponin fair--added metoprolol 12.5 twice daily-no Cardizem per cardiology  Mild sinus tachycardia-probably secondary to withdrawal of Cardizem-see above discussion-I have dose to metoprolol at minimal possible dose-if further issues will ask cards to adjust meds-they signed off on 7/6  Chr sys and diast-adjustment as per Cards--was hypotensive earlier in admit ckd iii-had aki-see below labs  DM ty ii-144-18,held lantus 4 u, cont ssi  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  Gout-cont allopruinol 100 mg daily    DVT prophylaxis: scd  Code Status: full   Family Communication: d/w daughter briefly   Disposition Plan: Edson Snowball, MD  [facility] Direct contact: [phone] --Via amion app OR  --[email or web address]; password [email or web address]  7PM-7AM contact night coverage as above 05/24/2018, 9:32 AM  LOS: 3 days   Consultants:  Cards  gi  Procedures:  Endo 7/6  Antimicrobials:  none  Interval history/Subjective:  2 episodes of bleeding overnight tarry and black appearing to patient Not dizzy Not orthostatic Asking about planning-explained to her may need more blood Still mild abdominal pain No chest pain No fever   Objective:  Glass eye to the right side No icterus Chest is clear S1-S2 no murmur telemetry shows just mild PVCs Tenderness in epigastrium still No lower extremity edema Neurologically intact moving all 4 limbs    Vitals:  Vitals:   05/23/18 2155 05/24/18 0525  BP: 121/73 129/65  Pulse: (!) 108 94  Resp:  16  Temp:  98.3 F (36.8 C)  SpO2:  99%    Exam:    I have personally reviewed the following:   Labs:  Hemoglobin has dropped again to 7.7  plt 20  Creat 1.49-->1.32---->1.3  Mild hyperchloremia 15  Troponin flat 0.03  Imaging studies: CT abd 7/5 IMPRESSION: 1. No findings identified to explain patient's diarrhea. 2. Bilateral polycystic kidneys with extensive scarring and cortical volume loss. 3. 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 Aortic atherosclerosis and coronary artery atherosclerotic calcifications. Aortic Atherosclerosis (ICD10-I70.0). 5. Large hiatal hernia.   Medical tests:  -  Test discussed with performing physician:  Yes discussed personally with Dr. Georgiann Cockerbrahmbatt of GI who will be seeing the patient once again  Decision to obtain  old records:  -  Review and summation of old records:  -  Scheduled Meds: . sodium chloride   Intravenous Once  . allopurinol  100 mg Oral Daily  . atorvastatin  40 mg Oral QPM  . clopidogrel  75 mg Oral Daily  . feeding supplement (GLUCERNA SHAKE)  237 mL Oral BID WC  . insulin aspart  0-15 Units Subcutaneous TID WC  . insulin aspart  0-5 Units Subcutaneous QHS  . metoprolol tartrate  12.5 mg Oral BID  . [START ON 05/27/2018] pantoprazole  40 mg Intravenous Q12H  . regadenoson  0.4 mg Intravenous Once  . sodium chloride flush  3 mL Intravenous Q12H   Continuous Infusions: . sodium chloride 20 mL/hr at 05/23/18 0300  . sodium chloride    . pantoprozole (PROTONIX) infusion      Principal Problem:   Symptomatic anemia Active Problems:   Hypertension   CAD/s/p CABG Mar 2019 w/ DES to vein graft Apr 2019   GERD (gastroesophageal reflux disease)   Type II diabetes mellitus (HCC)   Chronic combined systolic and diastolic heart failure (HCC)   Heme positive stool   HLD (hyperlipidemia)   Sleep apnea   Chronic hypoxemic respiratory failure (HCC)   CKD (chronic kidney disease), stage III (HCC)   LOS: 3 days

## 2018-05-24 NOTE — Transfer of Care (Signed)
Immediate Anesthesia Transfer of Care Note  Patient: Margaret Ward  Procedure(s) Performed: ESOPHAGOGASTRODUODENOSCOPY (EGD) WITH PROPOFOL (N/A ) SCHLEROTHERAPY OF gastric errosion  Patient Location: Endoscopy Unit  Anesthesia Type:MAC  Level of Consciousness: awake, alert  and oriented  Airway & Oxygen Therapy: Patient Spontanous Breathing and Patient connected to nasal cannula oxygen  Post-op Assessment: Report given to RN, Post -op Vital signs reviewed and stable and Patient moving all extremities X 4  Post vital signs: Reviewed and stable  Last Vitals:  Vitals Value Taken Time  BP    Temp    Pulse    Resp    SpO2      Last Pain:  Vitals:   05/24/18 1144  TempSrc: Oral  PainSc: 0-No pain      Patients Stated Pain Goal: 2 (40/99/27 8004)  Complications: No apparent anesthesia complications

## 2018-05-24 NOTE — Anesthesia Procedure Notes (Signed)
Procedure Name: MAC Date/Time: 05/24/2018 11:59 AM Performed by: Mariea Clonts, CRNA Pre-anesthesia Checklist: Patient identified, Emergency Drugs available, Suction available, Patient being monitored and Timeout performed Patient Re-evaluated:Patient Re-evaluated prior to induction Oxygen Delivery Method: Nasal cannula

## 2018-05-24 NOTE — Progress Notes (Signed)
[facility] Progress Note  Margaret ArgueGeraldine Ward 77 y.o. [birth year]  CC:   GI bleed   Subjective: patient started having black tarry stool yesterday. For bowel movement yesterday. Drop in hemoglobin noted. Bowel movement this morning was dark but not black and tarry. He continues to have epigastric abdominal pain. Feeling hungry   ROS : negative for active chest pain and shortness of breath.   Objective: Vital signs in last 24 hours: Vitals:   05/23/18 2155 05/24/18 0525  BP: 121/73 129/65  Pulse: (!) 108 94  Resp:  16  Temp:  98.3 F (36.8 C)  SpO2:  99%    Physical Exam:  Gen. Alert/oriented 3. Not in acute distress Heart . Rate and rhythm regular. Abdomen. Soft,epigastric tenderness to palpation, nondistended,No peritoneal signs. Bowel sounds present.  Lab Results: Recent Labs    05/23/18 0700 05/24/18 0555  NA 142 145  K 4.8 3.6  CL 117* 115*  CO2 19* 24  GLUCOSE 229* 156*  BUN 65* 47*  CREATININE 1.31* 1.36*  CALCIUM 7.9* 8.0*  PHOS 4.3  --    Recent Labs    05/22/18 0653 05/23/18 0700  AST 16  --   ALT 16  --   ALKPHOS 58  --   BILITOT 0.8  --   PROT 4.8*  --   ALBUMIN 2.7* 2.4*   Recent Labs    05/23/18 0700  05/23/18 1555 05/24/18 0555  WBC 7.2  --   --  6.7  NEUTROABS 4.6  --   --  3.2  HGB 7.4*   < > 8.5* 7.7*  HCT 23.3*   < > 26.9* 24.6*  MCV 97.9  --   --  95.3  PLT 125*  --   --  120*   < > = values in this interval not displayed.   No results for input(s): LABPROT, INR in the last 72 hours.    Assessment/Plan: - melena in setting of aspirin and Plavix . 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 No evidence of active bleeding. Aspirin  is currently on hold. She remains on Plavix - Coronary artery disease status post CABG followed by NSTEMI requiring PCI and stent placement in April 2019 - acute blood loss anemia.   Recommendations --------------------------- - she started having melena  yesterday. Drop in hemoglobin noted. Repeat EGD today. - transfuse as needed to keep hemoglobin around 8. Monitor H&H.continue PPI.  Risks (bleeding, infection, bowel perforation that could require surgery, sedation-related changes in cardiopulmonary systems), benefits (identification and possible treatment of source of symptoms, exclusion of certain causes of symptoms), and alternatives (watchful waiting, radiographic imaging studies, empiric medical treatment)  were explained to patient/family in detail and patient wishes to proceed.    Kathi DerParag Aubrina Nieman MD, FACP 05/24/2018, 10:49 AM  Contact #  [phone]

## 2018-05-24 NOTE — Care Management Important Message (Signed)
Important Message  Patient Details  Name: Margaret Ward MRN: [id number] Date of Birth: [birth year]   Medicare Important Message Given:  Yes    Saphia Vanderford Stefan ChurchBratton 05/24/2018, 4:28 PM

## 2018-05-24 NOTE — Anesthesia Postprocedure Evaluation (Signed)
Anesthesia Post Note  Patient: Margaret Ward  Procedure(s) Performed: ESOPHAGOGASTRODUODENOSCOPY (EGD) WITH PROPOFOL (N/A ) SCHLEROTHERAPY OF gastric errosion     Patient location during evaluation: PACU Anesthesia Type: MAC Level of consciousness: awake and alert Pain management: pain level controlled Vital Signs Assessment: post-procedure vital signs reviewed and stable Respiratory status: spontaneous breathing Cardiovascular status: stable Anesthetic complications: no    Last Vitals:  Vitals:   05/24/18 1233 05/24/18 1436  BP: (!) 106/46 119/65  Pulse: 93 86  Resp: 16   Temp:  36.8 C  SpO2: 100% 100%    Last Pain:  Vitals:   05/24/18 1436  TempSrc: Oral  PainSc:                  Nolon Nations

## 2018-05-24 NOTE — Op Note (Signed)
[facility] Patient Name: Margaret Ward Procedure Date : 05/24/2018 MRN: [id number] Attending MD: Kathi Der , MD Date of Birth: July 23, 1941 CSN: [id number] Age: 77 Admit Type: Inpatient Procedure:                Upper GI endoscopy Indications:              Melena Providers:                Kathi Der, MD, Leandrew Koyanagi, RN, Beryle Beams, Technician, Albertina Senegal. Beckner, CRNA Referring MD:              Medicines:                Sedation Administered by an Anesthesia Professional Complications:            No immediate complications. Estimated Blood Loss:     Estimated blood loss was minimal. Procedure:                Pre-Anesthesia Assessment:                           - Prior to the procedure, a History and Physical                            was performed, and patient medications and                            allergies were reviewed. The patient's tolerance of                            previous anesthesia was also reviewed. The risks                            and benefits of the procedure and the sedation                            options and risks were discussed with the patient.                            All questions were answered, and informed consent                            was obtained. Prior Anticoagulants: The patient has                            taken Plavix (clopidogrel), last dose was day of                            procedure. ASA Grade Assessment: III - A patient                            with severe systemic disease. After reviewing the  risks and benefits, the patient was deemed in                            satisfactory condition to undergo the procedure.                           After obtaining informed consent, the endoscope was                            passed under direct vision. Throughout the                            procedure, the patient's blood pressure, pulse,  and                            oxygen saturations were monitored continuously. The                            EG-[id number] ([id number]) scope was introduced through the                            mouth, and advanced to the second part of duodenum.                            The upper GI endoscopy was technically difficult                            and complex due to the patient's discomfort during                            the procedure. The patient tolerated the procedure                            well. Scope In: Scope Out: Findings:      LA Grade A (one or more mucosal breaks less than 5 mm, not extending       between tops of 2 mucosal folds) esophagitis with no bleeding was found       at the gastroesophageal junction.      A large hiatal hernia with a single large Cameron ulcer was found. ulcer       was clean-based with small area of pigmented spot. No evidence of active       bleeding. Site distal to the ulcer was injected with 1 mL of a 1:10,000       solution of epinephrine for hemostasis. oozing of blood from injection       site noted. further epinephrine injection was not performed. Ulcer is       too large for clip placement. No further endoscopic treatment was       performed as there was no active bleeding from this location.      irregular Z line with short segment Barrett's esophagus noted.      A few dispersed small erosions were found in the gastric antrum and in       the prepyloric region of the stomach.      The cardia and  gastric fundus were normal on retroflexion.      The duodenal bulb, first portion of the duodenum and second portion of       the duodenum were normal. Impression:               - LA Grade A reflux esophagitis.                           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                            Treatment not successful.                           - Erosive gastropathy.                           - Normal duodenal bulb, first portion  of the                            duodenum and second portion of the duodenum.                           - No specimens collected. Recommendation:           - Return patient to hospital ward for ongoing care.                           - Full liquid diet.                           - Continue present medications. Procedure Code(s):        --- Professional ---                           [phone], Esophagogastroduodenoscopy, flexible,                            transoral; with control of bleeding, any method Diagnosis Code(s):        --- Professional ---                           K44.9, Diaphragmatic hernia without obstruction or                            gangrene                           K25.9, Gastric ulcer, unspecified as acute or                            chronic, without hemorrhage or perforation                           K31.89, Other diseases of stomach and duodenum                           K92.1, Melena (  includes Hematochezia) CPT copyright 2017 American Medical Association. All rights reserved. The codes documented in this report are preliminary and upon coder review may  be revised to meet current compliance requirements. Kathi DerParag Takashi Korol, MD Kathi DerParag Primrose Oler, MD 05/24/2018 12:25:26 PM Number of Addenda: 0

## 2018-05-24 NOTE — Brief Op Note (Signed)
05/21/2018 - 05/24/2018  12:26 PM  PATIENT:  Margaret ArgueGeraldine Micek  77 y.o. female  PRE-OPERATIVE DIAGNOSIS:  Melena  POST-OPERATIVE DIAGNOSIS:  hiatal hernia With Cameron's ulcer  PROCEDURE:  Procedure(s): ESOPHAGOGASTRODUODENOSCOPY (EGD) WITH PROPOFOL (N/A) SCHLEROTHERAPY OF gastric errosion  SURGEON:  Surgeon(s) and Role:    * Nalina Yeatman, MD - Primary  Findings --------- - EGD showed large hiatal hernia with large clean base Cameron's ulcer with pigmented spot. No evidence of active bleeding. Findings similar to previous endoscopy. 1 mL of epinephrine injection was performed but patient started oozing out from injection site. Mild esophagitis and a few gastric erosions noted.  Recommendations ---------------------------- - Continue Protonix drip - If she continues to have drop in hemoglobin, may need to hold Plavix for a few days. - Full liquid diet for now. - monitor H&H. Transfuse to keep hemoglobin around 8. - GI will follow   Kathi DerParag Donivin Wirt MD, FACP 05/24/2018, 12:28 PM  Contact #  [phone]

## 2018-05-24 NOTE — Anesthesia Preprocedure Evaluation (Signed)
Anesthesia Evaluation  Patient identified by MRN, date of birth, ID band Patient awake    Reviewed: Allergy & Precautions, NPO status , Patient's Chart, lab work & pertinent test results  Airway Mallampati: II  TM Distance: >3 FB Neck ROM: Full    Dental no notable dental hx.    Pulmonary neg pulmonary ROS, former smoker,    Pulmonary exam normal breath sounds clear to auscultation       Cardiovascular hypertension, + CAD, + Past MI and +CHF  Normal cardiovascular exam Rhythm:Regular Rate:Normal     Neuro/Psych negative neurological ROS  negative psych ROS   GI/Hepatic Neg liver ROS, GERD  ,  Endo/Other  diabetes  Renal/GU Renal disease     Musculoskeletal negative musculoskeletal ROS (+)   Abdominal   Peds  Hematology  (+) anemia ,   Anesthesia Other Findings   Reproductive/Obstetrics negative OB ROS                             Anesthesia Physical Anesthesia Plan  ASA: III  Anesthesia Plan:    Post-op Pain Management:    Induction: Intravenous  PONV Risk Score and Plan:   Airway Management Planned:   Additional Equipment:   Intra-op Plan:   Post-operative Plan:   Informed Consent: I have reviewed the patients History and Physical, chart, labs and discussed the procedure including the risks, benefits and alternatives for the proposed anesthesia with the patient or authorized representative who has indicated his/her understanding and acceptance.   Dental advisory given  Plan Discussed with: CRNA  Anesthesia Plan Comments:         Anesthesia Quick Evaluation

## 2018-05-25 LAB — CBC
HCT: 23.4 % — ABNORMAL LOW (ref 36.0–46.0)
HEMATOCRIT: 29.2 % — AB (ref 36.0–46.0)
Hemoglobin: 7.4 g/dL — ABNORMAL LOW (ref 12.0–15.0)
Hemoglobin: 9.5 g/dL — ABNORMAL LOW (ref 12.0–15.0)
MCH: 31.1 pg (ref 26.0–34.0)
MCH: 31.8 pg (ref 26.0–34.0)
MCHC: 31.6 g/dL (ref 30.0–36.0)
MCHC: 32.5 g/dL (ref 30.0–36.0)
MCV: 98.3 fL (ref 78.0–100.0)
MCV: 97.7 fL (ref 78.0–100.0)
Platelets: 137 10*3/uL — ABNORMAL LOW (ref 150–400)
Platelets: 139 10*3/uL — ABNORMAL LOW (ref 150–400)
RBC: 2.38 MIL/uL — AB (ref 3.87–5.11)
RBC: 2.99 MIL/uL — ABNORMAL LOW (ref 3.87–5.11)
RDW: 15.3 % (ref 11.5–15.5)
RDW: 15 % (ref 11.5–15.5)
WBC: 7.3 10*3/uL (ref 4.0–10.5)
WBC: 7.5 10*3/uL (ref 4.0–10.5)

## 2018-05-25 LAB — PREPARE RBC (CROSSMATCH)

## 2018-05-25 LAB — GLUCOSE, CAPILLARY
GLUCOSE-CAPILLARY: 143 mg/dL — AB (ref 70–99)
GLUCOSE-CAPILLARY: 144 mg/dL — AB (ref 70–99)
Glucose-Capillary: 225 mg/dL — ABNORMAL HIGH (ref 70–99)
Glucose-Capillary: 114 mg/dL — ABNORMAL HIGH (ref 70–99)

## 2018-05-25 MED ORDER — SODIUM CHLORIDE 0.9% IV SOLUTION
Freq: Once | INTRAVENOUS | Status: AC
  Administered 2018-05-25: 11:00:00 via INTRAVENOUS

## 2018-05-25 MED ORDER — DIPHENHYDRAMINE HCL 25 MG PO CAPS
25.0000 mg | ORAL_CAPSULE | Freq: Once | ORAL | Status: AC
  Administered 2018-05-25: 25 mg via ORAL
  Filled 2018-05-25: qty 1

## 2018-05-25 MED ORDER — GLUCERNA SHAKE PO LIQD
237.0000 mL | Freq: Three times a day (TID) | ORAL | Status: DC
Start: 2018-05-25 — End: 2018-05-27
  Administered 2018-05-25 – 2018-05-27 (×6): 237 mL via ORAL

## 2018-05-25 MED ORDER — ADULT MULTIVITAMIN W/MINERALS CH
1.0000 | ORAL_TABLET | Freq: Every day | ORAL | Status: DC
  Administered 2018-05-25 – 2018-05-27 (×3): 1 via ORAL
  Filled 2018-05-25 (×3): qty 1

## 2018-05-25 MED ORDER — DIPHENHYDRAMINE HCL 25 MG PO CAPS
25.0000 mg | ORAL_CAPSULE | Freq: Once | ORAL | Status: AC
Start: 2018-05-25 — End: 2018-05-25
  Administered 2018-05-25: 25 mg via ORAL
  Filled 2018-05-25: qty 1

## 2018-05-25 MED ORDER — FUROSEMIDE 10 MG/ML IJ SOLN
20.0000 mg | Freq: Once | INTRAMUSCULAR | Status: AC
  Administered 2018-05-25: 20 mg via INTRAVENOUS
  Filled 2018-05-25: qty 2

## 2018-05-25 NOTE — Progress Notes (Signed)
[facility]  Margaret ArgueGeraldine Ward 77 y.o. [birth year]  CC:   GI bleed   Subjective:  No bowel movement yesterday evening. Abdominal pain resolved. Denied nausea vomiting. Eating only   ROS : negative for active chest pain and shortness of breath.   Objective: Vital signs in last 24 hours: Vitals:   05/25/18 1045 05/25/18 1102  BP: (!) 147/65 130/69  Pulse: 85 81  Resp: 18 18  Temp: 98.3 F (36.8 C) 98.3 F (36.8 C)  SpO2: 100% 100%    Physical Exam:  Gen. Alert/oriented 3. Not in acute distress Heart . Rate and rhythm regular. Abdomen. Soft,nontender, nondistended,No peritoneal signs. Bowel sounds present.  Lab Results: Recent Labs    05/23/18 0700 05/24/18 0555  NA 142 145  K 4.8 3.6  CL 117* 115*  CO2 19* 24  GLUCOSE 229* 156*  BUN 65* 47*  CREATININE 1.31* 1.36*  CALCIUM 7.9* 8.0*  PHOS 4.3  --    Recent Labs    05/23/18 0700  ALBUMIN 2.4*   Recent Labs    05/23/18 0700  05/24/18 0555 05/25/18 0458  WBC 7.2  --  6.7 7.3  NEUTROABS 4.6  --  3.2  --   HGB 7.4*   < > 7.7* 7.4*  HCT 23.3*   < > 24.6* 23.4*  MCV 97.9  --  95.3 98.3  PLT 125*  --  120* 137*   < > = values in this interval not displayed.   No results for input(s): LABPROT, INR in the last 72 hours.    Assessment/Plan: - melena in setting of aspirin and Plavix . 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 No evidence of active bleeding. Repeat EGD 05/24/2018 showed similar findings. 1 mL of epinephrine was injected on the distal aspect of the ulcer. - Coronary artery disease status post CABG followed by NSTEMI requiring PCI and stent placement in April 2019 - acute blood loss anemia.   Recommendations --------------------------- - mild drop in hemoglobin again noted. No bowel movements/black tarry stools today. Abdominal pain is resolved. - Monitor H&H. Transfuse to keep hemoglobin around 8. -  may need to hold Plavix for a few days if  she continues to have a drop in hemoglobin. - okay to advance diet to soft. GI will follow   Kathi DerParag Shena Vinluan MD, FACP 05/25/2018, 11:51 AM  Contact #  [phone]

## 2018-05-25 NOTE — Progress Notes (Signed)
[facility] PROGRESS NOTE  Margaret Ward KZS:[id number] DOB: [birth year] DOA: 05/21/2018 PCP: Sharlene Dory, DO  Narrative:  56 significant cardiac history recently had a CABG in March 2019, postop was complicated by graft occlusion requiring drug-eluting stent placement in April 2019, currently on dual antiplatelet therapy, also history of hypertension, sleep apnea, diabetes mellitus, chronic combined systolic and diastolic CHF who comes to the hospital with right lower quadrant abdominal pain that started over the last day as well as several large black stools associated with significant lightheadedness weakness and dizziness.  She was found to have worsening hemoglobin compared to her baseline, fecal occult was positive.  Cardiology and GI consulted.   A & Plan Acute GIB-Cameron ulcer, hemoglobin has dropped see below discussion-Dr. brahmbatt is aware--I have started Protonix GTT and kept the patient n.p.o. for the time being  Acute blood loss anemia secondary to ulcer as above-midline IVs present Hemoglobin remains in the 7 range-transfusing 2 more units packed red blood  CAD CABG + DES 2 mo prior-cont plavix-ASa resumption as per gi?Marland Kitchen  Troponin fair--added metoprolol 12.5 twice daily-no Cardizem per cardiology Long discussion with cardiologist on phone 7/10 who recommends holding Plavix 14 days and then consideration for resumption-much appreciated  Mild sinus tachycardia-probably secondary to withdrawal of Cardizem-see above discussion-I have dose to metoprolol at minimal possible dose-  Chr sys and diast-adjustment as per Cards--was hypotensive earlier in admit ckd iii-had aki-see below labs  DM ty ii-1 43-1 44,held lantus 4 u, cont ssi  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  Gout-cont allopruinol 100 mg daily    DVT prophylaxis: scd  Code Status: full   Family Communication: d/w daughter briefly   Disposition Plan: Edson Snowball, MD  [facility] Direct contact: [phone] --Via amion app OR  --[email or web address]; password [email or web address]  7PM-7AM contact night coverage as above 05/25/2018, 1:31 PM  LOS: 4 days   Consultants:  Cards  gi  Procedures:  Endo 7/6  Antimicrobials:  none  Interval history/Subjective:  One episode of dark stool yesterday Mild abdominal tenderness but is hungry No stool.today    Objective:  No Changes to exam   Glass eye to the right side No icterus Chest is clear S1-S2 no murmur telemetry shows just mild PVCs Tenderness in epigastrium still No lower extremity edema Neurologically intact moving all 4 limbs    Vitals:  Vitals:   05/25/18 1045 05/25/18 1102  BP: (!) 147/65 130/69  Pulse: 85 81  Resp: 18 18  Temp: 98.3 F (36.8 C) 98.3 F (36.8 C)  SpO2: 100% 100%    Exam:    I have personally reviewed the following:   Labs:  Hemoglobin has dropped again to 7.4  plt 137  Creat 1.49-->1.32---->1.3 and stable  Mild hyperchloremia 115  Troponin flat 0.03  Imaging studies: CT abd 7/5 IMPRESSION: 1. No findings identified to explain patient's diarrhea. 2. Bilateral polycystic kidneys with extensive scarring and cortical volume loss. 3. 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 Aortic atherosclerosis and coronary artery atherosclerotic calcifications. Aortic Atherosclerosis (ICD10-I70.0). 5. Large hiatal hernia.   Medical tests:  -   Test discussed with performing physician:    Decision to obtain old records:  -  Review and summation of old records:  -  Scheduled Meds: . sodium chloride  Intravenous Once  . allopurinol  100 mg Oral Daily  . atorvastatin  40 mg Oral QPM  . clopidogrel  75 mg Oral Daily  . feeding supplement (GLUCERNA SHAKE)   237 mL Oral BID WC  . furosemide  20 mg Intravenous Once  . insulin aspart  0-15 Units Subcutaneous TID WC  . insulin aspart  0-5 Units Subcutaneous QHS  . metoprolol tartrate  12.5 mg Oral BID  . [START ON 05/27/2018] pantoprazole  40 mg Intravenous Q12H  . regadenoson  0.4 mg Intravenous Once  . sodium chloride flush  3 mL Intravenous Q12H   Continuous Infusions: . sodium chloride 75 mL/hr at 05/25/18 0636  . pantoprozole (PROTONIX) infusion 8 mg/hr (05/25/18 1153)    Principal Problem:   Symptomatic anemia Active Problems:   Hypertension   CAD/s/p CABG Mar 2019 w/ DES to vein graft Apr 2019   GERD (gastroesophageal reflux disease)   Type II diabetes mellitus (HCC)   Chronic combined systolic and diastolic heart failure (HCC)   Heme positive stool   HLD (hyperlipidemia)   Sleep apnea   Chronic hypoxemic respiratory failure (HCC)   CKD (chronic kidney disease), stage III (HCC)   LOS: 4 days

## 2018-05-25 NOTE — Plan of Care (Signed)
  Problem: Education: Goal: Knowledge of General Education information will improve 05/25/2018 1632 by Montez Hagemanalwitan, Rielle Schlauch P, RN Outcome: Progressing 05/25/2018 1632 by Montez Hagemanalwitan, Anola Mcgough P, RN Outcome: Progressing   Problem: Health Behavior/Discharge Planning: Goal: Ability to manage health-related needs will improve 05/25/2018 1632 by Montez Hagemanalwitan, Danna Casella P, RN Outcome: Progressing 05/25/2018 1632 by Montez Hagemanalwitan, Latiana Tomei P, RN Outcome: Progressing   Problem: Clinical Measurements: Goal: Ability to maintain clinical measurements within normal limits will improve 05/25/2018 1632 by Montez Hagemanalwitan, Luay Balding P, RN Outcome: Progressing 05/25/2018 1632 by Montez Hagemanalwitan, Krystyna Cleckley P, RN Outcome: Progressing

## 2018-05-25 NOTE — Plan of Care (Signed)
  Problem: Education: Goal: Knowledge of General Education information will improve Outcome: Progressing   Problem: Health Behavior/Discharge Planning: Goal: Ability to manage health-related needs will improve Outcome: Progressing   Problem: Clinical Measurements: Goal: Ability to maintain clinical measurements within normal limits will improve Outcome: Progressing   

## 2018-05-25 NOTE — Progress Notes (Signed)
Nutrition Follow-up  DOCUMENTATION CODES:   Obesity unspecified  INTERVENTION:   -Increase Glucerna Shake po to TID, each supplement provides 220 kcal and 10 grams of protein -MVI with minerals daily  NUTRITION DIAGNOSIS:   Inadequate oral intake related to decreased appetite, diarrhea as evidenced by per patient/family report.  Progressing  GOAL:   Patient will meet greater than or equal to 90% of their needs  Progressing  MONITOR:   PO intake, Supplement acceptance, Diet advancement, I & O's, Labs, Weight trends  REASON FOR ASSESSMENT:   Malnutrition Screening Tool    ASSESSMENT:   77 year old female who presented to the ED with SOB and melena. PMH significant for CAD s/p recent CABG x 3 and subsequent stent placement, recent NSTEMI, type 2 diabetes mellitus, CHF, hypertension, CKD stage III, and hyperlipidemia.  7/6 - s/p EGD with findings of large hiatal hernia with single large Cameron ulcer 7/8- s/p EGD ans sclerotherapy of gastric erosion  Pt on phone at time of visit. Noted diet was just advanced to soft for lunch. Intake remains poor; PO: 25-50%. Pt is consuming Glucerna supplements per MAR.  Labs reviewed: CBGS: 143-144 (inpatient orders for glycemic control are 0-15 units insulin aspart TID with meals and 0-5 units insulin aspart q HS).   Diet Order:   Diet Order           DIET SOFT Room service appropriate? Yes; Fluid consistency: Thin  Diet effective now          EDUCATION NEEDS:   No education needs have been identified at this time  Skin:  Skin Assessment: Reviewed RN Assessment  Last BM:  05/23/18  Height:   Ht Readings from Last 1 Encounters:  05/21/18 5\' 2"  (1.575 m)    Weight:   Wt Readings from Last 1 Encounters:  05/25/18 181 lb 7 oz (82.3 kg)    Ideal Body Weight:  50 kg  BMI:  Body mass index is 33.19 kg/m.  Estimated Nutritional Needs:   Kcal:  1500-1700 kcal/day  Protein:  80-95 grams/day  Fluid:  1.5  L/day    Omeed Osuna A. Mayford KnifeWilliams, RD, LDN, CDE Pager: [phone] After hours Pager: [phone]

## 2018-05-26 DIAGNOSIS — J9611 Chronic respiratory failure with hypoxia: Secondary | ICD-10-CM

## 2018-05-26 LAB — BASIC METABOLIC PANEL
Anion gap: 8 (ref 5–15)
BUN: 22 mg/dL (ref 8–23)
CALCIUM: 7.9 mg/dL — AB (ref 8.9–10.3)
CHLORIDE: 113 mmol/L — AB (ref 98–111)
CO2: 24 mmol/L (ref 22–32)
CREATININE: 1.24 mg/dL — AB (ref 0.44–1.00)
GFR calc non Af Amer: 41 mL/min — ABNORMAL LOW (ref >60)
GFR, EST AFRICAN AMERICAN: 47 mL/min — AB (ref >60)
Glucose, Bld: 143 mg/dL — ABNORMAL HIGH (ref 70–99)
Potassium: 3.7 mmol/L (ref 3.5–5.1)
SODIUM: 145 mmol/L (ref 135–145)

## 2018-05-26 LAB — CBC
HCT: 28.1 % — ABNORMAL LOW (ref 36.0–46.0)
HEMOGLOBIN: 9.2 g/dL — AB (ref 12.0–15.0)
MCH: 31.9 pg (ref 26.0–34.0)
MCHC: 32.7 g/dL (ref 30.0–36.0)
MCV: 97.6 fL (ref 78.0–100.0)
Platelets: 142 10*3/uL — ABNORMAL LOW (ref 150–400)
RBC: 2.88 MIL/uL — ABNORMAL LOW (ref 3.87–5.11)
RDW: 14.9 % (ref 11.5–15.5)
WBC: 7 10*3/uL (ref 4.0–10.5)

## 2018-05-26 LAB — BPAM RBC
Blood Product Expiration Date: 201908022359
ISSUE DATE / TIME: 201907091042
UNIT TYPE AND RH: 8400

## 2018-05-26 LAB — HEMOGLOBIN AND HEMATOCRIT, BLOOD
HCT: 32.1 % — ABNORMAL LOW (ref 36.0–46.0)
HEMOGLOBIN: 10.3 g/dL — AB (ref 12.0–15.0)

## 2018-05-26 LAB — TYPE AND SCREEN
ABO/RH(D): AB POS
ANTIBODY SCREEN: NEGATIVE
Unit division: 0

## 2018-05-26 LAB — GLUCOSE, CAPILLARY
GLUCOSE-CAPILLARY: 113 mg/dL — AB (ref 70–99)
GLUCOSE-CAPILLARY: 211 mg/dL — AB (ref 70–99)
Glucose-Capillary: 121 mg/dL — ABNORMAL HIGH (ref 70–99)
Glucose-Capillary: 135 mg/dL — ABNORMAL HIGH (ref 70–99)

## 2018-05-26 MED ORDER — POTASSIUM CHLORIDE CRYS ER 20 MEQ PO TBCR
40.0000 meq | EXTENDED_RELEASE_TABLET | Freq: Once | ORAL | Status: AC
  Administered 2018-05-26: 40 meq via ORAL
  Filled 2018-05-26: qty 2

## 2018-05-26 MED ORDER — FUROSEMIDE 40 MG PO TABS
40.0000 mg | ORAL_TABLET | Freq: Once | ORAL | Status: AC
  Administered 2018-05-26: 40 mg via ORAL
  Filled 2018-05-26: qty 1

## 2018-05-26 MED ORDER — DIPHENHYDRAMINE HCL 12.5 MG/5ML PO ELIX
12.5000 mg | ORAL_SOLUTION | Freq: Once | ORAL | Status: AC
  Administered 2018-05-26: 12.5 mg via ORAL
  Filled 2018-05-26: qty 10

## 2018-05-26 NOTE — Progress Notes (Signed)
[facility] Progress Note  Margaret Ward 77 y.o. [birth year]  CC:   GI bleed   Subjective:  No further bleeding episodes. Denies abdominal pain, nausea or vomiting. Tolerating soft diet. Having normal-colored bowel movement.  ROS : negative for active chest pain and shortness of breath.   Objective: Vital signs in last 24 hours: Vitals:   05/25/18 2145 05/26/18 0521  BP: 135/82 136/75  Pulse: 89 77  Resp: 16 16  Temp: 98.3 F (36.8 C) 97.8 F (36.6 C)  SpO2: 97% 99%    Physical Exam:  Gen. Alert/oriented 3. Not in acute distress Heart . Rate and rhythm regular. Abdomen. Soft,nontender, nondistended,No peritoneal signs. Bowel sounds present.  Lab Results: Recent Labs    05/24/18 0555 05/26/18 0407  NA 145 145  K 3.6 3.7  CL 115* 113*  CO2 24 24  GLUCOSE 156* 143*  BUN 47* 22  CREATININE 1.36* 1.24*  CALCIUM 8.0* 7.9*   No results for input(s): AST, ALT, ALKPHOS, BILITOT, PROT, ALBUMIN in the last 72 hours. Recent Labs    05/24/18 0555  05/25/18 1700 05/26/18 0407  WBC 6.7   < > 7.5 7.0  NEUTROABS 3.2  --   --   --   HGB 7.7*   < > 9.5* 9.2*  HCT 24.6*   < > 29.2* 28.1*  MCV 95.3   < > 97.7 97.6  PLT 120*   < > 139* 142*   < > = values in this interval not displayed.   No results for input(s): LABPROT, INR in the last 72 hours.    Assessment/Plan: - melena in setting of aspirin and Plavix . 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 No evidence of active bleeding. Repeat EGD 05/24/2018 showed similar findings. 1 mL of epinephrine was injected on the distal aspect of the ulcer. - Coronary artery disease status post CABG followed by NSTEMI requiring PCI and stent placement in April 2019 - acute blood loss anemia.   Recommendations --------------------------- - Dr.Samtani  has discussed with the cardiology yesterday. Because of intermittent bleeding and intermittent drop in hemoglobin, her Plavix is currently on  hold next 2 weeks. - continue Protonix drip for additional day. - Recommend rechecking hemoglobin later this afternoon and again in the morning. - GI will follow.   Kathi DerParag Harve Spradley MD, FACP 05/26/2018, 9:07 AM  Contact #  [phone]

## 2018-05-26 NOTE — Plan of Care (Signed)

## 2018-05-26 NOTE — Progress Notes (Signed)
[facility] progress note  Margaret Ward OZH:[id number] DOB: [birth year] DOA: 05/21/2018 PCP: Sharlene Dory, DO  Narrative:  76/F with h/o CABG in March 2019, postop was complicated by graft occlusion requiring drug-eluting stent placement in April 2019, currently on dual antiplatelet therapy,  history of hypertension, sleep apnea, diabetes mellitus, chronic combined systolic and diastolic CHF presented to the hospital with upper GI bleed, melena and anemia. Cardiology and GI consulted.  Asessment & Plan  Acute upper GI bleed -Secondary to-Cameron ulcer -treated with PPI, EGD 7/6-noted large Cameron ulcer with pigmented spot -Due to recurrent melena underwent repeat EGD on 7/8: noted similar findings and 1 mL of epinephrine was then injected into the distal aspect of the ulcer -Due to persistent severe GI bleeding, aspirin and Plavix were held -Dr. Mahala Menghini discussed with Dr.Nelson yesterday-due to recurrent bleeding requiring large amount of PRBC transfusion, decision made albeit difficult to hold Plavix for 2 weeks -Continue PPI IV today and transition to twice a day at discharge  Acute blood loss anemia -Due to large Cameron ulcer with concomitant aspirin and Plavix use -Status post 4 units PRBC -Hemoglobin appears stable today  CAD/ recent CABG, history of drug-eluting stent in 03/02/18  -She is very close to being 3 months out from her recent stent -due to severe upper GI bleed requiring 4 units of PRBC and 2 endoscopies, after initial cardiology consultation case was discussed by my partner Dr.Samtani with Dr.Nelson yesterday, decision made to hold dual antiplatelets for 2 weeks total, then resume -Continue metoprolol, statin  Chronic systolic and diastolic CHF -Appears slightly volume overloaded at this time -Continue beta blocker, add by mouth Lasix  Type 2 diabetes mellitus -CBG is soft this admission, Lantus held, continue sliding scale  Polycystic kd  And staghorn calculus -creatinine stable, follow-up with urology and nephrology  Gout- -cont allopruinol 100 mg daily  DVT prophylaxis: scd    Code Status: full    Family Communication: no family at bedside Disposition Plan: Home in 1-2 days if hemoglobin stable  05/26/2018, 2:43 PM  LOS: 5 days   Consultants:  Cards  gi  Procedures:  Endo 7/6  Antimicrobials:  none  Interval history/Subjective:  One episode of dark stool yesterday Mild abdominal tenderness but is hungry No stool.today    Objective:  No Changes to exam   Glass eye to the right side No icterus Chest is clear S1-S2 no murmur telemetry shows just mild PVCs Tenderness in epigastrium still No lower extremity edema Neurologically intact moving all 4 limbs    Vitals:  Vitals:   05/26/18 0521 05/26/18 1312  BP: 136/75 117/89  Pulse: 77 84  Resp: 16   Temp: 97.8 F (36.6 C) 98.5 F (36.9 C)  SpO2: 99% 100%    Exam:     I have personally reviewed the labs  Imaging studies: CT abd 7/5 IMPRESSION: 1. No findings identified to explain patient's diarrhea. 2. Bilateral polycystic kidneys with extensive scarring and cortical volume loss. 3. 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 Aortic atherosclerosis and coronary artery atherosclerotic calcifications. Aortic Atherosclerosis (ICD10-I70.0). 5. Large hiatal hernia.   Medical tests:  -   Test discussed with performing physician:    Decision to obtain old records:  -  Review and summation of old records:  -  Scheduled Meds: . allopurinol  100 mg Oral Daily  . atorvastatin  40 mg Oral QPM  . feeding supplement (GLUCERNA SHAKE)  237 mL Oral TID BM  . insulin aspart  0-15 Units Subcutaneous TID WC  . insulin aspart  0-5 Units Subcutaneous QHS  . metoprolol tartrate  12.5 mg Oral BID  . multivitamin with minerals  1 tablet Oral Daily  . [START ON 05/27/2018] pantoprazole  40 mg Intravenous Q12H  .  regadenoson  0.4 mg Intravenous Once  . sodium chloride flush  3 mL Intravenous Q12H   Continuous Infusions: . pantoprozole (PROTONIX) infusion 8 mg/hr (05/26/18 0710)    Principal Problem:   Symptomatic anemia Active Problems:   Hypertension   CAD/s/p CABG Mar 2019 w/ DES to vein graft Apr 2019   GERD (gastroesophageal reflux disease)   Type II diabetes mellitus (HCC)   Chronic combined systolic and diastolic heart failure (HCC)   Heme positive stool   HLD (hyperlipidemia)   Sleep apnea   Chronic hypoxemic respiratory failure (HCC)   CKD (chronic kidney disease), stage III (HCC)   LOS: 5 days   Time spent: 35min  Zannie CovePreetha Kam Kushnir, MD Page via [email or web address] password [email or web address]

## 2018-05-27 DIAGNOSIS — K259 Gastric ulcer, unspecified as acute or chronic, without hemorrhage or perforation: Secondary | ICD-10-CM | POA: Diagnosis present

## 2018-05-27 DIAGNOSIS — K253 Acute gastric ulcer without hemorrhage or perforation: Secondary | ICD-10-CM | POA: Diagnosis present

## 2018-05-27 HISTORY — DX: Gastric ulcer, unspecified as acute or chronic, without hemorrhage or perforation: K25.9

## 2018-05-27 HISTORY — DX: Acute gastric ulcer without hemorrhage or perforation: K25.3

## 2018-05-27 LAB — CBC
HCT: 30 % — ABNORMAL LOW (ref 36.0–46.0)
Hemoglobin: 9.4 g/dL — ABNORMAL LOW (ref 12.0–15.0)
MCH: 31.2 pg (ref 26.0–34.0)
MCHC: 31.3 g/dL (ref 30.0–36.0)
MCV: 99.7 fL (ref 78.0–100.0)
PLATELETS: 155 10*3/uL (ref 150–400)
RBC: 3.01 MIL/uL — AB (ref 3.87–5.11)
RDW: 15.2 % (ref 11.5–15.5)
WBC: 7.5 10*3/uL (ref 4.0–10.5)

## 2018-05-27 LAB — GLUCOSE, CAPILLARY
GLUCOSE-CAPILLARY: 117 mg/dL — AB (ref 70–99)
Glucose-Capillary: 217 mg/dL — ABNORMAL HIGH (ref 70–99)

## 2018-05-27 MED ORDER — METOPROLOL TARTRATE 25 MG PO TABS: 12.5000 mg | ORAL_TABLET | Freq: Two times a day (BID) | ORAL | 0 refills | Status: DC

## 2018-05-27 MED ORDER — FUROSEMIDE 40 MG PO TABS: 40.0000 mg | ORAL_TABLET | Freq: Every day | ORAL | Status: DC

## 2018-05-27 MED ORDER — CLOPIDOGREL BISULFATE 75 MG PO TABS: 75.0000 mg | ORAL_TABLET | Freq: Every day | ORAL | Status: DC

## 2018-05-27 MED ORDER — PANTOPRAZOLE SODIUM 40 MG PO TBEC: 40.0000 mg | DELAYED_RELEASE_TABLET | Freq: Two times a day (BID) | ORAL | 0 refills | Status: DC

## 2018-05-27 NOTE — Evaluation (Signed)
Occupational Therapy Evaluation and Discharge Patient Details Name: Margaret ArgueGeraldine Dungee MRN: [id number] DOB: June 29, 1941 Today's Date: 05/27/2018    History of Present Illness Pt is a 77 y/o female admitted secondary to SOB and abdominal pain. Pt with symptomatic anemia and is s/p EGD on 7/6 and 7/8 which revealed large cameron ulcer. PMH includes HTN, CAD s/p CABG and stent placement, DM, OSA, DM, CHF, CKD.    Clinical Impression   Pt reports she was independent with ADL PTA. Currently pt overall supervision for ADL and functional mobility. Educated pt on energy conservation, fall prevention, and home safety strategies. Pt planning to d/c home today with 24/7 supervision from family. No further acute OT needs identified; signing off at this time. Please re-consult if needs change. Thank you for this referral.    Follow Up Recommendations  No OT follow up;Supervision - Intermittent    Equipment Recommendations  Tub/shower seat    Recommendations for Other Services       Precautions / Restrictions Precautions Precautions: Other (comment) Precaution Comments: Watch HR  Restrictions Weight Bearing Restrictions: No      Mobility Bed Mobility               General bed mobility comments: Pt OOB in chair upon arrival  Transfers Overall transfer level: Needs assistance Equipment used: None Transfers: Sit to/from Stand Sit to Stand: Supervision         General transfer comment: for safety    Balance Overall balance assessment: Mild deficits observed, not formally tested                                         ADL either performed or assessed with clinical judgement   ADL Overall ADL's : Needs assistance/impaired Eating/Feeding: Independent;Sitting   Grooming: Supervision/safety;Standing   Upper Body Bathing: Set up;Sitting   Lower Body Bathing: Supervison/ safety;Sit to/from stand   Upper Body Dressing : Set up;Sitting   Lower Body Dressing:  Supervision/safety;Sit to/from stand   Toilet Transfer: Supervision/safety;Ambulation Toilet Transfer Details (indicate cue type and reason): Simulated by sit to stand from chair with functional mobility in room       Tub/Shower Transfer Details (indicate cue type and reason): Discussed use of shower chair for safety with bathing upon return home and for energy conservation if needed; pt verbalized understanding Functional mobility during ADLs: Supervision/safety General ADL Comments: Educated pt on fall prevention and home safety strategies     Vision         Perception     Praxis      Pertinent Vitals/Pain Pain Assessment: No/denies pain     Hand Dominance Right   Extremity/Trunk Assessment Upper Extremity Assessment Upper Extremity Assessment: Overall WFL for tasks assessed   Lower Extremity Assessment Lower Extremity Assessment: Defer to PT evaluation   Cervical / Trunk Assessment Cervical / Trunk Assessment: Kyphotic   Communication Communication Communication: No difficulties   Cognition Arousal/Alertness: Awake/alert Behavior During Therapy: WFL for tasks assessed/performed Overall Cognitive Status: Within Functional Limits for tasks assessed                                     General Comments       Exercises     Shoulder Instructions      Home Living Family/patient expects to be discharged  to:: Private residence Living Arrangements: Children;Spouse/significant other Available Help at Discharge: Family;Available 24 hours/day Type of Home: House Home Access: Stairs to enter [employer] of Steps: 1 Entrance Stairs-Rails: None Home Layout: One level     Bathroom Shower/Tub: Chief Strategy Officer: Standard     Home Equipment: Grab bars - tub/shower;Cane - single point;Walker - 2 wheels          Prior Functioning/Environment Level of Independence: Independent with assistive device(s)         Comments: Used cane for ambulation         OT Problem List: Decreased strength;Decreased activity tolerance;Impaired balance (sitting and/or standing)      OT Treatment/Interventions:      OT Goals(Current goals can be found in the care plan section) Acute Rehab OT Goals Patient Stated Goal: to go home  OT Goal Formulation: All assessment and education complete, DC therapy  OT Frequency:     Barriers to D/C:            Co-evaluation              AM-PAC PT "6 Clicks" Daily Activity     Outcome Measure Help from another person eating meals?: None Help from another person taking care of personal grooming?: A Little Help from another person toileting, which includes using toliet, bedpan, or urinal?: A Little Help from another person bathing (including washing, rinsing, drying)?: A Little Help from another person to put on and taking off regular upper body clothing?: None Help from another person to put on and taking off regular lower body clothing?: A Little 6 Click Score: 20   End of Session    Activity Tolerance: Patient tolerated treatment well Patient left: in chair;with call bell/phone within reach  OT Visit Diagnosis: Unsteadiness on feet (R26.81)                Time: 0454-0981 OT Time Calculation (min): 14 min Charges:  OT General Charges $OT Visit: 1 Visit OT Evaluation $OT Eval Low Complexity: 1 Low G-Codes:     Sherrine Salberg A. Brett Albino, M.S., OTR/L Acute Rehab Department: [phone]  Gaye Alken 05/27/2018, 4:44 PM

## 2018-05-27 NOTE — Progress Notes (Signed)
Discharged home today. Home health services discussed with case manager. Discharged instructions,prescription, personal belongings given to patient.Verbalized understanding of instructions

## 2018-05-27 NOTE — Evaluation (Signed)
Physical Therapy Evaluation Patient Details Name: Margaret Ward MRN: [id number] DOB: 03-30-1941 Today's Date: 05/27/2018   History of Present Illness  Pt is a 77 y/o female admitted secondary to SOB and abdominal pain. Pt with symptomatic anemia and is s/p EGD on 7/6 and 7/8 which revealed large cameron ulcer. PMH includes HTN, CAD s/p CABG and stent placement, DM, OSA, DM, CHF, CKD.   Clinical Impression  Pt admitted secondary to problem above with deficits below. Mild unsteadiness noted during gait, however, no LOB noted with use of cane. HR ranging from low 120s to 137 during gait, however, pt asymptomatic. Returned to low 100s upon seated rest. Pt reports husband and daughter able to provide supervision for mobility tasks upon d/c. Will continue to follow acutely to maximize functional mobility independence and safety.     Follow Up Recommendations Home health PT;Supervision for mobility/OOB    Equipment Recommendations  None recommended by PT(refused 3in1)    Recommendations for Other Services OT consult     Precautions / Restrictions Precautions Precautions: Other (comment) Precaution Comments: Watch HR  Restrictions Weight Bearing Restrictions: No      Mobility  Bed Mobility               General bed mobility comments: In chair upon entry.   Transfers Overall transfer level: Needs assistance Equipment used: Straight cane Transfers: Sit to/from Stand Sit to Stand: Supervision         General transfer comment: Supervision for safety. Increased time required.   Ambulation/Gait Ambulation/Gait assistance: Supervision;Min guard Gait Distance (Feet): 100 Feet Assistive device: Straight cane Gait Pattern/deviations: Step-through pattern;Decreased stride length Gait velocity: Decreased    General Gait Details: Slow, guarded gait. Mild unsteadiness noted, however, no overt LOB noted. Demonstrated appropriate use of cane during mobility. Pt had walked laps  prior to session with mobility tech. HR elevated to 137 during mobility, however, returned to mid 100s upon seated rest.   Stairs            Wheelchair Mobility    Modified Rankin (Stroke Patients Only)       Balance Overall balance assessment: Needs assistance Sitting-balance support: No upper extremity supported;Feet supported Sitting balance-Leahy Scale: Good     Standing balance support: Single extremity supported;No upper extremity supported;During functional activity Standing balance-Leahy Scale: Fair                               Pertinent Vitals/Pain Pain Assessment: No/denies pain    Home Living Family/patient expects to be discharged to:: Private residence Living Arrangements: Children;Spouse/significant other Available Help at Discharge: Family;Available 24 hours/day Type of Home: House Home Access: Stairs to enter Entrance Stairs-Rails: None Entrance Stairs-Number of Steps: 1(threshold ) Home Layout: One level Home Equipment: Grab bars - tub/shower;Cane - single point;Walker - 2 wheels      Prior Function Level of Independence: Independent with assistive device(s)         Comments: Used cane for ambulation      Hand Dominance   Dominant Hand: Right    Extremity/Trunk Assessment   Upper Extremity Assessment Upper Extremity Assessment: Defer to OT evaluation    Lower Extremity Assessment Lower Extremity Assessment: Generalized weakness    Cervical / Trunk Assessment Cervical / Trunk Assessment: Normal  Communication   Communication: No difficulties  Cognition Arousal/Alertness: Awake/alert Behavior During Therapy: WFL for tasks assessed/performed Overall Cognitive Status: Within Functional Limits for tasks assessed  General Comments      Exercises     Assessment/Plan    PT Assessment Patient needs continued PT services  PT Problem List Decreased  strength;Decreased balance;Decreased mobility;Decreased knowledge of use of DME;Decreased knowledge of precautions;Cardiopulmonary status limiting activity       PT Treatment Interventions DME instruction;Gait training;Functional mobility training;Stair training;Therapeutic activities;Therapeutic exercise;Balance training;Patient/family education    PT Goals (Current goals can be found in the Care Plan section)  Acute Rehab PT Goals Patient Stated Goal: to go home  PT Goal Formulation: With patient Time For Goal Achievement: 06/10/18 Potential to Achieve Goals: Good    Frequency Min 3X/week   Barriers to discharge        Co-evaluation               AM-PAC PT "6 Clicks" Daily Activity  Outcome Measure Difficulty turning over in bed (including adjusting bedclothes, sheets and blankets)?: A Little Difficulty moving from lying on back to sitting on the side of the bed? : A Little Difficulty sitting down on and standing up from a chair with arms (e.g., wheelchair, bedside commode, etc,.)?: A Little Help needed moving to and from a bed to chair (including a wheelchair)?: A Little Help needed walking in hospital room?: A Little Help needed climbing 3-5 steps with a railing? : A Lot 6 Click Score: 17    End of Session Equipment Utilized During Treatment: Gait belt Activity Tolerance: Patient tolerated treatment well Patient left: in chair;with call bell/phone within reach Nurse Communication: Mobility status PT Visit Diagnosis: Other abnormalities of gait and mobility (R26.89);Muscle weakness (generalized) (M62.81)    Time: [id number] PT Time Calculation (min) (ACUTE ONLY): 18 min   Charges:   PT Evaluation $PT Eval Moderate Complexity: 1 Mod     PT G Codes:        Gladys DammeBrittany Monserrat Vidaurri, PT, DPT  Acute Rehabilitation Services  Pager: [phone]   Lehman PromBrittany S Delina Kruczek 05/27/2018, 12:36 PM

## 2018-05-27 NOTE — Discharge Summary (Signed)
Physician Discharge Summary  Margaret Ward ZOX:[id number] DOB: [birth year] DOA: 05/21/2018  PCP: Sharlene Dory, DO  Admit date: 05/21/2018 Discharge date: 05/27/2018  Time spent: 35 minutes  Recommendations for Outpatient Follow-up:  1. RESTART PLAVIX Monday 7/15 2. PCP Dr.Wendling in 1 week, please resume Iron at FU and Check CBC 3. Cardiology Dr.Munly in 1-2weeks, please titrate Lasix dose and evaluate need for ASA 4. Gi Dr.Brahmbhatt in 2months   Discharge Diagnoses:   Large gastric Cameron Ulcer  Acute Blood loss anemia   Hypertension   CAD/s/p CABG Mar 2019 w/ DES to vein graft Apr 2019   GERD (gastroesophageal reflux disease)   Type II diabetes mellitus (HCC)   Chronic combined systolic and diastolic heart failure (HCC)   Heme positive stool   HLD (hyperlipidemia)   Sleep apnea   Chronic hypoxemic respiratory failure (HCC)   CKD (chronic kidney disease), stage III (HCC)   Acute Cameron ulcer   Discharge Condition:improved  Diet recommendation: DIabetic  Filed Weights   05/24/18 0525 05/25/18 0635 05/27/18 0455  Weight: 79.4 kg (175 lb) 82.3 kg (181 lb 7 oz) 81.6 kg (180 lb)    History of present illness:  76/F with h/o CABG in March 2019, postop was complicated by graft occlusion requiring drug-eluting stent placement in April 2019, currently on dual antiplatelet therapy,  history of hypertension, sleep apnea, diabetes mellitus, chronic combined systolic and diastolic CHF presented to the hospital with upper GI bleed, melena and severe anemia  Hospital Course:   Acute upper GI bleed -Secondary to-Cameron ulcer -treated with IV PPI, EGD 7/6-noted large Cameron ulcer with pigmented spot, initially kept on Plavix due to recent stent. -Subsequently due to recurrent melena underwent repeat EGD on 7/8: noted similar findings and 1 mL of epinephrine was then injected into the distal aspect of the ulcer, now aspirin and Plavix were held -given 5 units of PRBC  this admission -Dr. Mahala Menghini discussed with Cardiology [facility] on Call, Dr.Nelson 7/9-due to recurrent bleeding requiring large amount of PRBC transfusion, decision made albeit difficult to hold Plavix for 2 weeks. -at this time 7/11, no further bleeding, Hb stable at 9 and I rediscussed with GI Dr.Brahmbhatt and we made decision to resume Plavix within 1 week ie 7/15 and earlier than originally planned due to high risk of instent thrombosis -needs CBC checked in 1 week at FU and Plavix resumed on Monday 7/15, if she rebleeds needs to come back to ER. -FU with Dr.Brahmbhatt GI in 2months will need repeat EGD  Acute blood loss anemia -Due to large Cameron ulcer with concomitant aspirin and Plavix use -Status post 5 units PRBC -Hemoglobin appears stable now in 9 range at discharge  CAD/ recent CABG, history of drug-eluting stent in 03/02/18  -due to severe upper GI bleed requiring 5 units of PRBC and 2 endoscopies, after initial cardiology consultation case was discussed by my partner Dr.Samtani with Cardiologist on call for [facility] Dr.Nelson 7/9, decision made to hold dual antiplatelets for 2 weeks total, then resume -now plan to resume Plavix next Monday 7/15-see above discussion -Continue metoprolol, statin  Chronic systolic and diastolic CHF -required IV lasix x2, BP soft this admission due to large volume blood loss and so diuretic dose decreased at discharge -Continue beta blocker, PO Lasix -titrate lasix dose at FU  Type 2 diabetes mellitus -stable, resumed lantus  Polycystic kidney And staghorn calculus -noted on CT, asymptomatic -creatinine stable, follow-up with urology and nephrology  Gout- -cont allopruinol 100 mg daily  Procedures: EGD 05/22/2018   showed large Cameron ulcer with pigmented spot.   Repeat EGD 05/24/2018 showed similar findings. 1 mL of epinephrine was injected on the distal aspect of the ulcer.     Discharge Exam: Vitals:   05/26/18 2148  05/27/18 0430  BP: 106/73 110/79  Pulse: (!) 114 78  Resp: 18 18  Temp: 98.5 F (36.9 C) 98.2 F (36.8 C)  SpO2: 99% 98%    General: AAOx3 Cardiovascular: S1S2/RRR Respiratory: CTAB  Discharge Instructions   Discharge Instructions    Diet - low sodium heart healthy   Complete by:  As directed    Diet Carb Modified   Complete by:  As directed    Increase activity slowly   Complete by:  As directed      Allergies as of 05/27/2018      Reactions   Methylprednisolone    Severe cramps legs and arms   Keflex [cephalexin] Hives   Ketorolac Swelling   Morphine And Related    Itching    Novocain [procaine] Swelling   Septra [sulfamethoxazole-trimethoprim] Nausea And Vomiting      Medication List    STOP taking these medications   aspirin EC 81 MG tablet   diltiazem 240 MG 24 hr capsule Commonly known as:  CARDIZEM CD   ferrous sulfate 325 (65 FE) MG tablet   ranitidine 150 MG tablet Commonly known as:  ZANTAC     TAKE these medications   acetaminophen 500 MG tablet Commonly known as:  TYLENOL Take 500 mg by mouth every 6 (six) hours as needed for headache.   albuterol 108 (90 Base) MCG/ACT inhaler Commonly known as:  PROVENTIL HFA;VENTOLIN HFA Inhale 2 puffs into the lungs every 6 (six) hours as needed for wheezing or shortness of breath.   allopurinol 100 MG tablet Commonly known as:  ZYLOPRIM Take 1 tablet (100 mg total) by mouth daily.   atorvastatin 40 MG tablet Commonly known as:  LIPITOR Take 40 mg by mouth every evening.   clopidogrel 75 MG tablet Commonly known as:  PLAVIX Take 1 tablet (75 mg total) by mouth daily. Restart This on Monday Start taking on:  05/31/2018 What changed:    additional instructions  These instructions start on 05/31/2018. If you are unsure what to do until then, ask your doctor or other care provider.   furosemide 40 MG tablet Commonly known as:  LASIX Take 1 tablet (40 mg total) by mouth daily. What changed:   additional instructions   Insulin Glargine 100 UNIT/ML Solostar Pen Commonly known as:  LANTUS Inject 4 Units into the skin at bedtime.   Melatonin 10 MG Tabs Take 10 mg by mouth at bedtime.   metoprolol tartrate 25 MG tablet Commonly known as:  LOPRESSOR Take 0.5 tablets (12.5 mg total) by mouth 2 (two) times daily.   nitroGLYCERIN 0.4 MG SL tablet Commonly known as:  NITROSTAT Place 1 tablet (0.4 mg total) under the tongue every 5 (five) minutes x 3 doses as needed for chest pain.   pantoprazole 40 MG tablet Commonly known as:  PROTONIX Take 1 tablet (40 mg total) by mouth 2 (two) times daily.   Phenyleph-Chlorphen-Codeine 03-18-09 MG/5ML Syrp Take 5 mLs by mouth every 6 (six) hours as needed. What changed:  reasons to take this   potassium chloride 10 MEQ tablet Commonly known as:  KLOR-CON 10 Take 2 tabs with every dose of Lasix.   vitamin C 500 MG tablet Commonly known as:  ASCORBIC ACID Take 500 mg by mouth 2 (two) times daily.      Allergies  Allergen Reactions  . Methylprednisolone     Severe cramps legs and arms  . Keflex [Cephalexin] Hives  . Ketorolac Swelling  . Morphine And Related     Itching   . Novocain [Procaine] Swelling  . Septra [Sulfamethoxazole-Trimethoprim] Nausea And Vomiting   Follow-up Information    Brahmbhatt, Parag, MD. Schedule an appointment as soon as possible for a visit in 2 month(s).   Specialty:  Gastroenterology Why:  Follow-up for large Cameron's ulcer. Contact information: [address] [place] [address] [phone]        Baldo DaubMunley, Brian J, MD. Schedule an appointment as soon as possible for a visit in 2 week(s).   Specialty:  Cardiology Contact information: [address] [place] [address] [phone]            The results of significant diagnostics from this hospitalization (including imaging, microbiology, ancillary and laboratory) are listed below for reference.     Significant Diagnostic Studies: Dg Chest 2 View  Result Date: 05/21/2018 CLINICAL DATA:  Chest and abdominal pain. EXAM: CHEST - 2 VIEW COMPARISON:  03/08/2018.  CT 03/01/2018. FINDINGS: Mediastinum hilar structures normal. Prior CABG. Cardiomegaly with normal pulmonary vascularity. Low lung volumes with mild bibasilar atelectasis. Tiny nodular density noted over the right upper lung is unchanged from prior CT of 03/01/2018. Reference is made to that report for further recommendations. No pleural effusion or pneumothorax. Hiatal hernia noted. IMPRESSION: 1. Low lung volumes with mild bibasilar atelectasis. Tiny nodular density again noted over the right upper lung. Tiny nodular density again noted over the right upper lung, unchanged from prior CT of 03/01/2018. Reference is made to prior CT report for further discussion. 2.  Prior CABG.  Cardiomegaly.  No pulmonary venous congestion. 3.  Hiatal hernia noted. Electronically Signed   By: Maisie Fushomas  Register   On: 05/21/2018 10:52   Ct Abdomen Pelvis W Contrast  Result Date: 05/21/2018 CLINICAL DATA:  Diarrhea. EXAM: CT ABDOMEN AND PELVIS WITH CONTRAST TECHNIQUE: Multidetector CT imaging of the abdomen and pelvis was performed using the standard protocol following bolus administration of intravenous contrast. CONTRAST:  80mL OMNIPAQUE IOHEXOL 300 MG/ML  SOLN COMPARISON:  CT chest 03/01/2018. FINDINGS: Lower chest: Scarring noted within the lung bases. No pleural effusion. Calcification within the RCA coronary artery noted. Hepatobiliary: No focal liver abnormality is seen. Status post cholecystectomy. No biliary dilatation. Pancreas: Unremarkable. No pancreatic ductal dilatation or surrounding inflammatory changes. Spleen: Normal in size without focal abnormality. Adrenals/Urinary Tract: Normal appearance of the adrenal glands. Multiple bilateral kidney cysts are identified. Extensive renal cortical scarring and atrophy noted. There is a large left-sided  staghorn calculus identified which measures approximately 8 cm in cranial caudal dimension. Urinary bladder appears normal. Stomach/Bowel: Large hiatal hernia identified. Stomach otherwise unremarkable. The small bowel loops have a normal course and caliber. The colon appears collapsed. No definite colonic inflammation identified. Vascular/Lymphatic: Aortic atherosclerosis without aneurysm. No abdominal or pelvic adenopathy. No inguinal adenopathy. Reproductive: Status post hysterectomy. No adnexal masses. Other: No free fluid or fluid collections. Musculoskeletal: Degenerative disc disease noted throughout the lumbar spine. Anterolisthesis of L4 on L5 is identified. No suspicious bone lesions identified. IMPRESSION: 1. No findings identified to explain patient's diarrhea. 2. Bilateral polycystic kidneys with extensive scarring and cortical volume loss. 3. 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 Aortic atherosclerosis and coronary artery atherosclerotic  calcifications. Aortic Atherosclerosis (ICD10-I70.0). 5. Large hiatal hernia. Electronically Signed   By: Signa Kell M.D.   On: 05/21/2018 13:29   Nm Myocar Single W/spect W/wall Motion And Ef  Result Date: 05/23/2018 CLINICAL DATA:  Acute coronary syndrome. History of CAD, post median sternotomy. Note, only the rest images were obtained as the stress portion was canceled by Tereso Newcomer, PA. EXAM: MYOCARDIAL IMAGING WITH SPECT (REST) TECHNIQUE: Standard myocardial SPECT imaging was performed after resting intravenous injection of 10 Tc-52m tetrofosmin. Quantitative gated imaging was also performed to evaluate left ventricular wall motion, and estimate left ventricular ejection fraction. COMPARISON:  Chest radiograph-05/21/2018; chest CT 03/01/2018 FINDINGS: Raw images: Mild-to-moderate GI attenuation as well as breast and chest wall attenuation is seen the provided rest images. No significant patient motion artifact. Perfusion: There is a  moderate-sized area decreased profusion involving the basilar aspect of the inferior wall of the left ventricle as well as small mild areas of decreased profusion involving the mid aspect of the septum and the left ventricular apex. Wall Motion: Not obtained as only rest images were acquired. IMPRESSION: Potential areas of infarction versus attenuation involving the inferior wall, septum and left ventricular apex on this rest only examination. Electronically Signed   By: Simonne Come M.D.   On: 05/23/2018 13:48    Microbiology: No results found for this or any previous visit (from the past 240 hour(s)).   Labs: Basic Metabolic Panel: Recent Labs  Lab 05/21/18 0958 05/22/18 0653 05/23/18 0700 05/24/18 0555 05/26/18 0407  NA 141 142 142 145 145  K 4.8 3.8 4.8 3.6 3.7  CL 107 113* 117* 115* 113*  CO2 23 22 19* 24 24  GLUCOSE 221* 144* 229* 156* 143*  BUN 88* 74* 65* 47* 22  CREATININE 1.49* 1.33* 1.31* 1.36* 1.24*  CALCIUM 8.2* 8.2* 7.9* 8.0* 7.9*  PHOS  --   --  4.3  --   --    Liver Function Tests: Recent Labs  Lab 05/21/18 0958 05/22/18 0653 05/23/18 0700  AST 18 16  --   ALT 17 16  --   ALKPHOS 71 58  --   BILITOT 0.5 0.8  --   PROT 5.4* 4.8*  --   ALBUMIN 2.8* 2.7* 2.4*   Recent Labs  Lab 05/21/18 0958  LIPASE 42   No results for input(s): AMMONIA in the last 168 hours. CBC: Recent Labs  Lab 05/21/18 0958  05/23/18 0700  05/24/18 0555 05/25/18 0458 05/25/18 1700 05/26/18 0407 05/26/18 1403 05/27/18 0500  WBC 14.2*   < > 7.2  --  6.7 7.3 7.5 7.0  --  7.5  NEUTROABS 11.7*  --  4.6  --  3.2  --   --   --   --   --   HGB 9.2*   < > 7.4*   < > 7.7* 7.4* 9.5* 9.2* 10.3* 9.4*  HCT 30.4*   < > 23.3*   < > 24.6* 23.4* 29.2* 28.1* 32.1* 30.0*  MCV 98.7   < > 97.9  --  95.3 98.3 97.7 97.6  --  99.7  PLT 193   < > 125*  --  120* 137* 139* 142*  --  155   < > = values in this interval not displayed.   Cardiac Enzymes: Recent Labs  Lab 05/21/18 1528 05/22/18 0653  05/22/18 1244 05/22/18 1910  TROPONINI <0.03 0.03* 0.03* 0.03*   BNP: BNP (last 3 results) Recent Labs    05/21/18 6962  BNP 68.8    ProBNP (last 3 results) Recent Labs    03/09/18 0910 04/06/18 0940 04/29/18 0905  PROBNP 4,452* 2,203* 1,843*    CBG: Recent Labs  Lab 05/26/18 1215 05/26/18 1703 05/26/18 2113 05/27/18 0802 05/27/18 1219  GLUCAP 121* 211* 135* 117* 217*       Signed:  Zannie Cove MD.  [facility] 05/27/2018, 12:51 PM

## 2018-05-27 NOTE — Care Management Note (Signed)
Case Management Note  Patient Details  Name: Memory ArgueGeraldine Kim MRN: [id number] Date of Birth: 04/06/1941  Subjective/Objective:                    Action/Plan:  Spoke to patient at bedside. Offered choice , patient has had [facility] in past and would like same HHPT. [facility]  Patient refused bedside commode. Expected Discharge Date:  05/27/18               Expected Discharge Plan:  Home w Home Health Services  In-House Referral:     Discharge planning Services  CM Consult  Post Acute Care Choice:  Home Health Choice offered to:  Patient  DME Arranged:  N/A DME Agency:  NA  HH Arranged:  PT [facility]  Status of Service:  Completed, signed off  If discussed at Long Length of Stay Meetings, dates discussed:    Additional Comments:  Kingsley PlanWile, Stevon Gough Marie, RN 05/27/2018, 1:54 PM

## 2018-05-27 NOTE — Progress Notes (Addendum)
[facility] Progress Note  [name] 77 y.o. 06-19-1941  CC:   GI bleed   Subjective:  No further bleeding episodes. Denies abdominal pain, nausea or vomiting. Tolerating soft diet. Having normal-colored bowel movement.ambulating in the hallway.  ROS : negative for active chest pain and shortness of breath.   Objective: Vital signs in last 24 hours: Vitals:   05/26/18 2148 05/27/18 0430  BP: 106/73 110/79  Pulse: (!) 114 78  Resp: 18 18  Temp: 98.5 F (36.9 C) 98.2 F (36.8 C)  SpO2: 99% 98%    Physical Exam:  Gen. Alert/oriented 3. Not in acute distress Heart . Rate and rhythm regular. Abdomen. Soft,nontender, nondistended,No peritoneal signs. Bowel sounds present.  Lab Results: Recent Labs    05/26/18 0407  NA 145  K 3.7  CL 113*  CO2 24  GLUCOSE 143*  BUN 22  CREATININE 1.24*  CALCIUM 7.9*   No results for input(s): AST, ALT, ALKPHOS, BILITOT, PROT, ALBUMIN in the last 72 hours. Recent Labs    05/26/18 0407 05/26/18 1403 05/27/18 0500  WBC 7.0  --  7.5  HGB 9.2* 10.3* 9.4*  HCT 28.1* 32.1* 30.0*  MCV 97.6  --  99.7  PLT 142*  --  155   No results for input(s): LABPROT, INR in the last 72 hours.    Assessment/Plan: - melena in setting of aspirin and Plavix . 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 No evidence of active bleeding. Repeat EGD 05/24/2018 showed similar findings. 1 mL of epinephrine was injected on the distal aspect of the ulcer. - Coronary artery disease status post CABG followed by NSTEMI requiring PCI and stent placement in April 2019 - acute blood loss anemia.   Recommendations --------------------------- - biopsies negative for H. Pylori and showed gastric intestinal metaplasia. - hemoglobin is stable. No further bleeding episodes.D/W Dr. Buren KosJoeseph.  Given her recent cardiac events, I think it is reasonable to resume Plavix 4 days from now - on 05/31/2018 . She remains at high risk for  bleeding but risk of cardiac events are also higher if we stop Plavix for longer duration. - avoid NSAIDs. - Consider repeat EGD to document healing of large ulcers as well as for follow-up intestinal metaplasia once able to hold Plavix from cardiac standpoint. - F/up in GI clinic in 2 months. GI will sign off. Call us back if needed   Kathi DerParag Betta Balla MD, FACP 05/27/2018, 11:14 AM  Contact #  [phone]

## 2018-05-29 DIAGNOSIS — K250 Acute gastric ulcer with hemorrhage: Secondary | ICD-10-CM | POA: Diagnosis not present

## 2018-05-29 DIAGNOSIS — E1122 Type 2 diabetes mellitus with diabetic chronic kidney disease: Secondary | ICD-10-CM | POA: Diagnosis not present

## 2018-05-29 DIAGNOSIS — R262 Difficulty in walking, not elsewhere classified: Secondary | ICD-10-CM | POA: Diagnosis not present

## 2018-05-29 DIAGNOSIS — Z7902 Long term (current) use of antithrombotics/antiplatelets: Secondary | ICD-10-CM | POA: Diagnosis not present

## 2018-05-29 DIAGNOSIS — I5042 Chronic combined systolic (congestive) and diastolic (congestive) heart failure: Secondary | ICD-10-CM | POA: Diagnosis not present

## 2018-05-29 DIAGNOSIS — Z951 Presence of aortocoronary bypass graft: Secondary | ICD-10-CM | POA: Diagnosis not present

## 2018-05-29 DIAGNOSIS — Z794 Long term (current) use of insulin: Secondary | ICD-10-CM | POA: Diagnosis not present

## 2018-05-29 DIAGNOSIS — N183 Chronic kidney disease, stage 3 (moderate): Secondary | ICD-10-CM | POA: Diagnosis not present

## 2018-05-29 DIAGNOSIS — M6281 Muscle weakness (generalized): Secondary | ICD-10-CM | POA: Diagnosis not present

## 2018-05-29 DIAGNOSIS — Z9981 Dependence on supplemental oxygen: Secondary | ICD-10-CM | POA: Diagnosis not present

## 2018-05-29 DIAGNOSIS — I252 Old myocardial infarction: Secondary | ICD-10-CM | POA: Diagnosis not present

## 2018-05-29 DIAGNOSIS — Z7982 Long term (current) use of aspirin: Secondary | ICD-10-CM | POA: Diagnosis not present

## 2018-05-29 DIAGNOSIS — M1000 Idiopathic gout, unspecified site: Secondary | ICD-10-CM | POA: Diagnosis not present

## 2018-05-29 DIAGNOSIS — D62 Acute posthemorrhagic anemia: Secondary | ICD-10-CM | POA: Diagnosis not present

## 2018-05-29 DIAGNOSIS — J9611 Chronic respiratory failure with hypoxia: Secondary | ICD-10-CM | POA: Diagnosis not present

## 2018-05-29 DIAGNOSIS — I1310 Hypertensive heart and chronic kidney disease without heart failure, with stage 1 through stage 4 chronic kidney disease, or unspecified chronic kidney disease: Secondary | ICD-10-CM | POA: Diagnosis not present

## 2018-05-31 ENCOUNTER — Telehealth: Payer: Self-pay | Admitting: Family Medicine

## 2018-05-31 NOTE — Telephone Encounter (Signed)
[facility]

## 2018-05-31 NOTE — Telephone Encounter (Signed)
Copied from CRM [id number]. Topic: General - Other >> May 31, 2018  3:23 PM Tamela OddiHarris, [place] Norberto J wrote: Reason for CRM: Liji with [facility] called to give verbal orders for home health:  2 x for 4 wks, 1 x for 2 wks therafter, for gait, balance and strengthening.  CB# [phone].

## 2018-06-02 DIAGNOSIS — J9611 Chronic respiratory failure with hypoxia: Secondary | ICD-10-CM | POA: Diagnosis not present

## 2018-06-02 DIAGNOSIS — E1122 Type 2 diabetes mellitus with diabetic chronic kidney disease: Secondary | ICD-10-CM | POA: Diagnosis not present

## 2018-06-02 DIAGNOSIS — K250 Acute gastric ulcer with hemorrhage: Secondary | ICD-10-CM | POA: Diagnosis not present

## 2018-06-02 DIAGNOSIS — I1310 Hypertensive heart and chronic kidney disease without heart failure, with stage 1 through stage 4 chronic kidney disease, or unspecified chronic kidney disease: Secondary | ICD-10-CM | POA: Diagnosis not present

## 2018-06-02 DIAGNOSIS — N183 Chronic kidney disease, stage 3 (moderate): Secondary | ICD-10-CM | POA: Diagnosis not present

## 2018-06-02 DIAGNOSIS — I5042 Chronic combined systolic (congestive) and diastolic (congestive) heart failure: Secondary | ICD-10-CM | POA: Diagnosis not present

## 2018-06-04 DIAGNOSIS — K250 Acute gastric ulcer with hemorrhage: Secondary | ICD-10-CM | POA: Diagnosis not present

## 2018-06-04 DIAGNOSIS — E1122 Type 2 diabetes mellitus with diabetic chronic kidney disease: Secondary | ICD-10-CM | POA: Diagnosis not present

## 2018-06-04 DIAGNOSIS — I5042 Chronic combined systolic (congestive) and diastolic (congestive) heart failure: Secondary | ICD-10-CM | POA: Diagnosis not present

## 2018-06-04 DIAGNOSIS — J9611 Chronic respiratory failure with hypoxia: Secondary | ICD-10-CM | POA: Diagnosis not present

## 2018-06-04 DIAGNOSIS — I1310 Hypertensive heart and chronic kidney disease without heart failure, with stage 1 through stage 4 chronic kidney disease, or unspecified chronic kidney disease: Secondary | ICD-10-CM | POA: Diagnosis not present

## 2018-06-04 DIAGNOSIS — N183 Chronic kidney disease, stage 3 (moderate): Secondary | ICD-10-CM | POA: Diagnosis not present

## 2018-06-06 NOTE — Progress Notes (Signed)
Cardiology Office Note:    Date:  06/07/2018   ID:  Margaret Ward, DOB 18-Aug-1941, MRN [id number]  PCP:  Sharlene DoryWendling, Nicholas Paul, DO  Cardiologist:  Norman HerrlichBrian Glenda Kunst, MD    Referring MD: Sharlene DoryWendling, Nicholas Paul*    ASSESSMENT:    1. Coronary artery disease of native artery of native heart with stable angina pectoris (HCC)   2. Chronic diastolic heart failure (HCC)   3. Hypertensive heart disease with heart failure (HCC)   4. Cameron ulcer, acute   5. Symptomatic anemia   6. Hyperlipidemia, unspecified hyperlipidemia type    PLAN:    In order of problems listed above:  1. Stable she is back on clopidogrel single agent with PPI. 2. Decompensated fluid overloaded she will double her diuretic until her next office follow-up. 3. Stable blood pressure continue current treatment increase diuretic for decompensated heart failure 4. Stable no recurrent bleeding since hospital discharge despite clopidogrel 5. Managed by her PCP 6. Stable continue her statin with CAD   Next appointment: One month   Medication Adjustments/Labs and Tests Ordered: Current medicines are reviewed at length with the patient today.  Concerns regarding medicines are outlined above.  No orders of the defined types were placed in this encounter.  Meds ordered this encounter  Medications  . furosemide (LASIX) 40 MG tablet    Sig: Take 2 tablets (80 mg total) by mouth daily.    Dispense:  60 tablet    Refill:  3    Chief Complaint  Patient presents with  . Follow-up   [facility] with [facility] GI bleed transfused 5 units of blood and her dual antiplatelet therapy was held.  She was resumed on clopidogrel was seen by her PCP today and had a repeat CBC drawn History of Present Illness:    Margaret ArgueGeraldine Ward is a 77 y.o. female with a hx of CAD, CABG with NonSTEMI 03/01/2018 with PCI and stent  heart failure CKD and recent [facility] admission with acute GI bleed with gastric ulcer last  seen 04/29/2018.  She required 5 units of PRBC's and her dual antiplatelet therapy was discontinued with recurrent bleeding while hospitalized. Compliance with diet, lifestyle and medications: Yes  Since discharge her weight is not changed which is peripheral edema and shortness of breath with physical activity like she did with decompensated heart failure.  She had a CBC drawn today and her PCP office she was placed on iron.  She has had no recurrent bleeding back on clopidogrel.  No orthopnea chest pain syncope or TIA. Past Medical History:  Diagnosis Date  . Anemia   . Arthritis    "hands, arms, all over the place" (03/02/2018)  . CAD in native artery    a. s/p CABG in 01/2018. b. Recurrent CP 02/2018 -> cath showing early graft closure SVG-diagonal, received DES to D1.  . Chronic combined systolic and diastolic CHF (congestive heart failure) (HCC)    a. LVEF 40-45% by echo 02/2018.  Marland Kitchen. Chronic lower back pain   . CKD (chronic kidney disease), stage III (HCC)   . GERD (gastroesophageal reflux disease)   . Glass eye    right eye  . Gout    "on daily RX" (03/02/2018)  . History of kidney stones   . Hyperlipidemia   . Hypertension   . Hypertensive heart disease   . Kidney cysts   . Kidney disease   . Left ventricular aneurysm   . Migraine    "used  to get them twice/week; haven't had them in a long time" (03/02/2018)  . NSTEMI (non-ST elevated myocardial infarction) (HCC) 03/01/2018  . On home oxygen therapy    "3L at night and prn during the day" (03/02/2018)  . Pneumonia    used to get it twice/year; stopped after I had hysterectomy" (03/02/2018)  . Sleep apnea   . Type II diabetes mellitus (HCC)     Past Surgical History:  Procedure Laterality Date  . BACK SURGERY    . BIOPSY  05/22/2018   Procedure: BIOPSY;  Surgeon: Kathi Der, MD;  Location: [facility] ENDOSCOPY;  Service: Gastroenterology;;  . CARDIAC CATHETERIZATION  12/2017   "before OHS"  . CHOLECYSTECTOMY OPEN  1982  .  CORONARY ARTERY BYPASS GRAFT  01/19/2018   "CABG X3; in [place]"  . CORONARY STENT INTERVENTION N/A 03/02/2018   Procedure: CORONARY STENT INTERVENTION;  Surgeon: Kathleene Hazel, MD;  Location: [facility] INVASIVE CV LAB;  Service: Cardiovascular;  Laterality: N/A;  . DILATION AND CURETTAGE OF UTERUS    . ESOPHAGOGASTRODUODENOSCOPY (EGD) WITH PROPOFOL N/A 05/22/2018   Procedure: ESOPHAGOGASTRODUODENOSCOPY (EGD) WITH PROPOFOL;  Surgeon: Kathi Der, MD;  Location: [facility] ENDOSCOPY;  Service: Gastroenterology;  Laterality: N/A;  . ESOPHAGOGASTRODUODENOSCOPY (EGD) WITH PROPOFOL N/A 05/24/2018   Procedure: ESOPHAGOGASTRODUODENOSCOPY (EGD) WITH PROPOFOL;  Surgeon: Kathi Der, MD;  Location: [facility];  Service: Gastroenterology;  Laterality: N/A;  . 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 KNEE ARTHROSCOPY Right X 4  . LEFT HEART CATH AND CORS/GRAFTS ANGIOGRAPHY N/A 03/02/2018   Procedure: LEFT HEART CATH AND CORS/GRAFTS ANGIOGRAPHY;  Surgeon: Kathleene Hazel, MD;  Location: [facility];  Service: Cardiovascular;  Laterality: N/A;  . LUMBAR DISC SURGERY    . REDUCTION MAMMAPLASTY Bilateral X 2  . SCHLEROTHERAPY  05/24/2018   Procedure: Theresia Majors;  Surgeon: Kathi Der, MD;  Location: [facility];  Service: Gastroenterology;;  . TONSILLECTOMY  1953  . VAGINAL HYSTERECTOMY  1970    Current Medications: Current Meds  Medication Sig  . acetaminophen (TYLENOL) 500 MG tablet Take 500 mg by mouth every 6 (six) hours as needed for headache.  . albuterol (PROVENTIL HFA;VENTOLIN HFA) 108 (90 Base) MCG/ACT inhaler Inhale 2 puffs into the lungs every 6 (six) hours as needed for wheezing or shortness of breath.  . allopurinol (ZYLOPRIM) 100 MG tablet Take 1 tablet (100 mg total) by mouth daily.  Marland Kitchen atorvastatin (LIPITOR) 40 MG tablet Take 40 mg by mouth every evening.   . clopidogrel (PLAVIX) 75 MG tablet Take 1 tablet (75 mg total) by mouth daily. Restart This on  Monday  . furosemide (LASIX) 40 MG tablet Take 2 tablets (80 mg total) by mouth daily.  . Insulin Glargine (LANTUS) 100 UNIT/ML Solostar Pen Inject 4 Units into the skin at bedtime.  . Melatonin 10 MG TABS Take 10 mg by mouth at bedtime.   . metoprolol tartrate (LOPRESSOR) 25 MG tablet Take 0.5 tablets (12.5 mg total) by mouth 2 (two) times daily.  . nitroGLYCERIN (NITROSTAT) 0.4 MG SL tablet Place 1 tablet (0.4 mg total) under the tongue every 5 (five) minutes x 3 doses as needed for chest pain.  . pantoprazole (PROTONIX) 40 MG tablet Take 1 tablet (40 mg total) by mouth 2 (two) times daily.  . potassium chloride (KLOR-CON 10) 10 MEQ tablet Take 2 tabs with every dose of Lasix.  Marland Kitchen vitamin C (ASCORBIC ACID) 500 MG tablet Take 500 mg by mouth 2 (two) times  daily.  . [DISCONTINUED] furosemide (LASIX) 40 MG tablet Take 1 tablet (40 mg total) by mouth daily.     Allergies:   Methylprednisolone; Keflex [cephalexin]; Ketorolac; Morphine and related; Novocain [procaine]; and Septra [sulfamethoxazole-trimethoprim]   Social History   Socioeconomic History  . Marital status: Married    Spouse name: Not on file  . Number of children: Not on file  . Years of education: Not on file  . Highest education level: Not on file  Occupational History  . Not on file  Social Needs  . Financial resource strain: Not on file  . Food insecurity:    Worry: Not on file    Inability: Not on file  . Transportation needs:    Medical: Not on file    Non-medical: Not on file  Tobacco Use  . Smoking status: Former Smoker    Packs/day: 0.12    Years: 3.00    Pack years: 0.36    Types: Cigarettes    Last attempt to quit: 1963    Years since quitting: 56.5  . Smokeless tobacco: Never Used  Substance and Sexual Activity  . Alcohol use: Not Currently    Frequency: Never  . Drug use: Never  . Sexual activity: Not on file  Lifestyle  . Physical activity:    Days per week: Not on file    Minutes per session:  Not on file  . Stress: Not on file  Relationships  . Social connections:    Talks on phone: Not on file    Gets together: Not on file    Attends religious service: Not on file    Active member of club or organization: Not on file    Attends meetings of clubs or organizations: Not on file    Relationship status: Not on file  Other Topics Concern  . Not on file  Social History Narrative  . Not on file     Family History: The patient's family history includes Diabetes in her father; Heart Problems in her mother; Heart disease in her brother; Hypertension in her brother, daughter, daughter, daughter, and daughter; Kidney disease in her brother and sister. ROS:   Please see the history of present illness.    All other systems reviewed and are negative.  EKGs/Labs/Other Studies Reviewed:    The following studies were reviewed today:   Recent Labs: 04/29/2018: NT-Pro BNP 1,843 05/21/2018: B Natriuretic Peptide 68.8 05/22/2018: ALT 16 05/26/2018: BUN 22; Creatinine, Ser 1.24; Potassium 3.7; Sodium 145 05/27/2018: Hemoglobin 9.4; Platelets 155  Recent Lipid Panel    Component Value Date/Time   CHOL 88 (L) 04/29/2018 0905   TRIG 73 04/29/2018 0905   HDL 50 04/29/2018 0905   CHOLHDL 1.8 04/29/2018 0905   LDLCALC 23 04/29/2018 0905    Physical Exam:    VS:  BP 116/64 (BP Location: Right Arm, Patient Position: Sitting, Cuff Size: Normal)   Pulse 85   Ht 5\' 2"  (1.575 m)   Wt 173 lb (78.5 kg)   SpO2 97%   BMI 31.64 kg/m     Wt Readings from Last 3 Encounters:  06/07/18 173 lb (78.5 kg)  06/07/18 173 lb (78.5 kg)  05/27/18 180 lb (81.6 kg)     GEN:  Well nourished, well developed in no acute distress HEENT: Normal NECK: No JVD; No carotid bruits LYMPHATICS: No lymphadenopathy CARDIAC: RRR, no murmurs, rubs, gallops RESPIRATORY:  Clear to auscultation without rales, wheezing or rhonchi  ABDOMEN: Soft, non-tender, non-distended MUSCULOSKELETAL:  2-3+ edema edema; No deformity   SKIN: Warm and dry NEUROLOGIC:  Alert and oriented x 3 PSYCHIATRIC:  Normal affect    Signed, Norman Herrlich, MD  06/07/2018 4:20 PM    [facility]

## 2018-06-07 ENCOUNTER — Ambulatory Visit (INDEPENDENT_AMBULATORY_CARE_PROVIDER_SITE_OTHER): Payer: Medicare Other | Admitting: Cardiology

## 2018-06-07 ENCOUNTER — Encounter: Payer: Self-pay | Admitting: Cardiology

## 2018-06-07 ENCOUNTER — Encounter: Payer: Self-pay | Admitting: Family Medicine

## 2018-06-07 ENCOUNTER — Ambulatory Visit (INDEPENDENT_AMBULATORY_CARE_PROVIDER_SITE_OTHER): Payer: Medicare Other | Admitting: Family Medicine

## 2018-06-07 VITALS — BP 120/80 | HR 97 | Temp 98.3°F | Ht 62.0 in | Wt 173.0 lb

## 2018-06-07 VITALS — BP 116/64 | HR 85 | Ht 62.0 in | Wt 173.0 lb

## 2018-06-07 DIAGNOSIS — I5032 Chronic diastolic (congestive) heart failure: Secondary | ICD-10-CM

## 2018-06-07 DIAGNOSIS — I110 Hypertensive heart disease with heart failure: Secondary | ICD-10-CM | POA: Diagnosis not present

## 2018-06-07 DIAGNOSIS — D649 Anemia, unspecified: Secondary | ICD-10-CM

## 2018-06-07 DIAGNOSIS — E785 Hyperlipidemia, unspecified: Secondary | ICD-10-CM

## 2018-06-07 DIAGNOSIS — K253 Acute gastric ulcer without hemorrhage or perforation: Secondary | ICD-10-CM

## 2018-06-07 DIAGNOSIS — I25118 Atherosclerotic heart disease of native coronary artery with other forms of angina pectoris: Secondary | ICD-10-CM | POA: Diagnosis not present

## 2018-06-07 LAB — CBC
HEMATOCRIT: 35.2 % — AB (ref 36.0–46.0)
HEMOGLOBIN: 11.3 g/dL — AB (ref 12.0–15.0)
MCHC: 32.2 g/dL (ref 30.0–36.0)
MCV: 93.9 fl (ref 78.0–100.0)
Platelets: 249 10*3/uL (ref 150.0–400.0)
RBC: 3.75 Mil/uL — ABNORMAL LOW (ref 3.87–5.11)
RDW: 15.8 % — ABNORMAL HIGH (ref 11.5–15.5)
WBC: 5.6 10*3/uL (ref 4.0–10.5)

## 2018-06-07 MED ORDER — FUROSEMIDE 40 MG PO TABS: 80.0000 mg | ORAL_TABLET | Freq: Every day | ORAL | 3 refills | Status: DC

## 2018-06-07 NOTE — Progress Notes (Signed)
Pre visit review using our clinic review tool, if applicable. No additional management support is needed unless otherwise documented below in the visit note. 

## 2018-06-07 NOTE — Progress Notes (Signed)
Chief Complaint  Patient presents with  . Hospitalization Follow-up    HPI Margaret Ward is a 77 y.o. y.o. female who presents for a transition of care visit.  Pt was discharged from [facility] on 05/27/18.  Within 48 business hours of discharge our office contacted her via telephone to coordinate her care and needs.   Patient admitted on 05/21/2018 and discharged on 05/27/2018 for acute GI bleed.  She received 4 units of packed red blood cells.  Upper endoscopy revealed an acute Cameron ulcer.  Her Plavix was set to resume on 7/15.  She has an appointment with gastroenterology and cardiology.  She feels better overall, however still become short of breath on exertion and is having some fatigue.  She has not been taking iron daily.  The patient reports no blood in her stool, dark tarry stools, nausea/vomiting.   Past Medical History:  Diagnosis Date  . Anemia   . Arthritis    "hands, arms, all over the place" (03/02/2018)  . CAD in native artery    a. s/p CABG in 01/2018. b. Recurrent CP 02/2018 -> cath showing early graft closure SVG-diagonal, received DES to D1.  . Chronic combined systolic and diastolic CHF (congestive heart failure) (HCC)    a. LVEF 40-45% by echo 02/2018.  Marland Kitchen Chronic lower back pain   . CKD (chronic kidney disease), stage III (HCC)   . GERD (gastroesophageal reflux disease)   . Glass eye    right eye  . Gout    "on daily RX" (03/02/2018)  . History of kidney stones   . Hyperlipidemia   . Hypertension   . Hypertensive heart disease   . Kidney cysts   . Kidney disease   . Left ventricular aneurysm   . Migraine    "used to get them twice/week; haven't had them in a long time" (03/02/2018)  . NSTEMI (non-ST elevated myocardial infarction) (HCC) 03/01/2018  . On home oxygen therapy    "3L at night and prn during the day" (03/02/2018)  . Pneumonia    used to get it twice/year; stopped after I had hysterectomy" (03/02/2018)  . Sleep apnea   . Type II diabetes mellitus (HCC)      Family History  Problem Relation Age of Onset  . Heart Problems Mother   . Diabetes Father   . Kidney disease Sister   . Heart disease Brother   . Hypertension Brother   . Kidney disease Brother   . Hypertension Daughter   . Hypertension Daughter   . Hypertension Daughter   . Hypertension Daughter    Allergies as of 06/07/2018      Reactions   Methylprednisolone    Severe cramps legs and arms   Keflex [cephalexin] Hives   Ketorolac Swelling   Morphine And Related    Itching    Novocain [procaine] Swelling   Septra [sulfamethoxazole-trimethoprim] Nausea And Vomiting      Medication List        Accurate as of 06/07/18 12:20 PM. Always use your most recent med list.          acetaminophen 500 MG tablet Commonly known as:  TYLENOL Take 500 mg by mouth every 6 (six) hours as needed for headache.   albuterol 108 (90 Base) MCG/ACT inhaler Commonly known as:  PROVENTIL HFA;VENTOLIN HFA Inhale 2 puffs into the lungs every 6 (six) hours as needed for wheezing or shortness of breath.   allopurinol 100 MG tablet Commonly known as:  ZYLOPRIM Take  1 tablet (100 mg total) by mouth daily.   atorvastatin 40 MG tablet Commonly known as:  LIPITOR Take 40 mg by mouth every evening.   clopidogrel 75 MG tablet Commonly known as:  PLAVIX Take 1 tablet (75 mg total) by mouth daily. Restart This on Monday   furosemide 40 MG tablet Commonly known as:  LASIX Take 1 tablet (40 mg total) by mouth daily.   Insulin Glargine 100 UNIT/ML Solostar Pen Commonly known as:  LANTUS Inject 4 Units into the skin at bedtime.   Melatonin 10 MG Tabs Take 10 mg by mouth at bedtime.   metoprolol tartrate 25 MG tablet Commonly known as:  LOPRESSOR Take 0.5 tablets (12.5 mg total) by mouth 2 (two) times daily.   nitroGLYCERIN 0.4 MG SL tablet Commonly known as:  NITROSTAT Place 1 tablet (0.4 mg total) under the tongue every 5 (five) minutes x 3 doses as needed for chest pain.    pantoprazole 40 MG tablet Commonly known as:  PROTONIX Take 1 tablet (40 mg total) by mouth 2 (two) times daily.   Phenyleph-Chlorphen-Codeine 03-18-09 MG/5ML Syrp Take 5 mLs by mouth every 6 (six) hours as needed.   potassium chloride 10 MEQ tablet Commonly known as:  KLOR-CON 10 Take 2 tabs with every dose of Lasix.   vitamin C 500 MG tablet Commonly known as:  ASCORBIC ACID Take 500 mg by mouth 2 (two) times daily.       ROS:  Constitutional: No fevers or chills, no weight loss HEENT: No headaches, hearing loss, or runny nose, no sore throat Heart: No chest pain Lungs: No SOB, no cough Abd: No bowel changes, no pain, no N/V GU: No urinary complaints Neuro: No numbness, tingling or weakness Msk: No joint or muscle pain  Objective BP 120/80 (BP Location: Left Arm, Patient Position: Sitting, Cuff Size: Normal)   Pulse 97   Temp 98.3 F (36.8 C) (Oral)   Ht 5\' 2"  (1.575 m)   Wt 173 lb (78.5 kg)   SpO2 97%   BMI 31.64 kg/m  General Appearance:  awake, alert, oriented, in no acute distress and well developed, well nourished Skin: +papule on L orbit, otherwise there are no suspicious lesions or rashes of concern Head/face:  NCAT Eyes:  EOMI, PERRLA Ears:  canals and TMs NI Nose/Sinuses:  negative Mouth/Throat:  Mucosa moist, no lesions; pharynx without erythema, edema or exudate. Neck:  neck- supple, no mass, non-tender and no jvd Lungs: Clear to auscultation.  No rales, rhonchi, or wheezing. Normal effort, no accessory muscle use. Heart:  Heart sounds are normal.  Regular rate and rhythm without murmur, gallop or rub. No bruits. Abdomen:  BS+, soft, NT, ND, no masses or organomegaly Musculoskeletal:  No muscle group atrophy or asymmetry, gait normal Neurologic:  Alert and oriented x 3, gait normal., reflexes normal and symmetric, strength and  sensation grossly normal Psych exam: Nml mood and affect, age appropriate judgment and insight  Cameron ulcer, acute - Plan:  CBC  Discharge summary and medication list have been reviewed/reconciled.  Labs pending at the time of discharge have been reviewed or are still pending at the time of this visit.  Follow-up labs and appointments have been ordered and/or coordinated appropriately. Educational materials regarding the patient's admitting diagnosis provided.  TRANSITIONAL CARE MANAGEMENT CERTIFICATION:  I certify the following are true:   1. Communication with the patient/care giver was made within 2 business days of discharge.  2. Complexity of Medical decision making is  moderate.  3. Face to face visit occurred within 14 days of discharge.   Go back on Fe, try for 3x/d. Ck CBC. Clinically appears to be improving. F/u as originally scheduled. The patient and her daughter voiced understanding and agreement to the plan.  Jilda Roche [place], DO 06/07/18 12:20 PM

## 2018-06-07 NOTE — Patient Instructions (Addendum)
Medication Instructions:  Your physician has recommended you make the following change in your medication:   INCREASE: Furosemide (lasix) 80 mg/day.  Labwork: None.  Testing/Procedures: None.  Follow-Up: Your physician recommends that you schedule a follow-up appointment in: 4 weeks.  Any Other Special Instructions Will Be Listed Below (If Applicable).     If you need a refill on your cardiac medications before your next appointment, please call your pharmacy.     Heart Failure  Weigh yourself every morning when you first wake up and record on a calender or note pad, bring this to your office visits. Using a pill tender can help with taking your medications consistently.  Limit your fluid intake to 2 liters daily  Limit your sodium intake to less than 2-3 grams daily. Ask if you need dietary teaching.  If you gain more than 3 pounds (from your dry weight ), double your dose of diuretic for the day.  If you gain more than 5 pounds (from your dry weight), double your dose of lasix and call your heart failure doctor.  Please do not smoke tobacco since it is very bad for your heart.  Please do not drink alcohol since it can worsen your heart failure.Also avoid OTC nonsteroidal drugs, such as advil, aleve and motrin.  Try to exercise for at least 30 minutes every day because this will help your heart be more efficient. You may be eligible for supervised cardiac rehab, ask your physician.  Furosemide tablets What is this medicine? FUROSEMIDE (fyoor [place] se mide) is a diuretic. It helps you make more urine and to lose salt and excess water from your body. This medicine is used to treat high blood pressure, and edema or swelling from heart, kidney, or liver disease. This medicine may be used for other purposes; ask your health care provider or pharmacist if you have questions. COMMON BRAND NAME(S): Active-Medicated Specimen Kit, Delone, Diuscreen, Lasix, RX Specimen Collection  Kit, Specimen Collection Kit, URINX Medicated Specimen Collection What should I tell my health care provider before I take this medicine? They need to know if you have any of these conditions: -abnormal blood electrolytes -diarrhea or vomiting -gout -heart disease -kidney disease, small amounts of urine, or difficulty passing urine -liver disease -thyroid disease -an unusual or allergic reaction to furosemide, sulfa drugs, other medicines, foods, dyes, or preservatives -pregnant or trying to get pregnant -breast-feeding How should I use this medicine? Take this medicine by mouth with a glass of water. Follow the directions on the prescription label. You may take this medicine with or without food. If it upsets your stomach, take it with food or milk. Do not take your medicine more often than directed. Remember that you will need to pass more urine after taking this medicine. Do not take your medicine at a time of day that will cause you problems. Do not take at bedtime. Talk to your pediatrician regarding the use of this medicine in children. While this drug may be prescribed for selected conditions, precautions do apply. Overdosage: If you think you have taken too much of this medicine contact a poison control center or emergency room at once. NOTE: This medicine is only for you. Do not share this medicine with others. What if I miss a dose? If you miss a dose, take it as soon as you can. If it is almost time for your next dose, take only that dose. Do not take double or extra doses. What may interact with this  medicine? -aspirin and aspirin-like medicines -certain antibiotics -chloral hydrate -cisplatin -cyclosporine -digoxin -diuretics -laxatives -lithium -medicines for blood pressure -medicines that relax muscles for surgery -methotrexate -NSAIDs, medicines for pain and inflammation like ibuprofen, naproxen, or indomethacin -phenytoin -steroid medicines like prednisone or  cortisone -sucralfate -thyroid hormones This list may not describe all possible interactions. Give your health care provider a list of all the medicines, herbs, non-prescription drugs, or dietary supplements you use. Also tell them if you smoke, drink alcohol, or use illegal drugs. Some items may interact with your medicine. What should I watch for while using this medicine? Visit your doctor or health care professional for regular checks on your progress. Check your blood pressure regularly. Ask your doctor or health care professional what your blood pressure should be, and when you should contact him or her. If you are a diabetic, check your blood sugar as directed. You may need to be on a special diet while taking this medicine. Check with your doctor. Also, ask how many glasses of fluid you need to drink a day. You must not get dehydrated. You may get drowsy or dizzy. Do not drive, use machinery, or do anything that needs mental alertness until you know how this drug affects you. Do not stand or sit up quickly, especially if you are an older patient. This reduces the risk of dizzy or fainting spells. Alcohol can make you more drowsy and dizzy. Avoid alcoholic drinks. This medicine can make you more sensitive to the sun. Keep out of the sun. If you cannot avoid being in the sun, wear protective clothing and use sunscreen. Do not use sun lamps or tanning beds/booths. What side effects may I notice from receiving this medicine? Side effects that you should report to your doctor or health care professional as soon as possible: -blood in urine or stools -dry mouth -fever or chills -hearing loss or ringing in the ears -irregular heartbeat -muscle pain or weakness, cramps -skin rash -stomach upset, pain, or nausea -tingling or numbness in the hands or feet -unusually weak or tired -vomiting or diarrhea -yellowing of the eyes or skin Side effects that usually do not require medical attention  (report to your doctor or health care professional if they continue or are bothersome): -headache -loss of appetite -unusual bleeding or bruising This list may not describe all possible side effects. Call your doctor for medical advice about side effects. You may report side effects to FDA at 1-800-FDA-1088. Where should I keep my medicine? Keep out of the reach of children. Store at room temperature between 15 and 30 degrees C (59 and 86 degrees F). Protect from light. Throw away any unused medicine after the expiration date. NOTE: This sheet is a summary. It may not cover all possible information. If you have questions about this medicine, talk to your doctor, pharmacist, or health care provider.  2018 Elsevier/Gold Standard (2015-01-24 13:49:50)

## 2018-06-07 NOTE — Patient Instructions (Signed)
1-2 days to get the results of your labs back.  Go back on iron. I think trying to take this 3 times daily is reasonable, but it might upset stomach.   Let us know if you need anything.

## 2018-06-09 DIAGNOSIS — N183 Chronic kidney disease, stage 3 (moderate): Secondary | ICD-10-CM | POA: Diagnosis not present

## 2018-06-09 DIAGNOSIS — I1310 Hypertensive heart and chronic kidney disease without heart failure, with stage 1 through stage 4 chronic kidney disease, or unspecified chronic kidney disease: Secondary | ICD-10-CM | POA: Diagnosis not present

## 2018-06-09 DIAGNOSIS — J9611 Chronic respiratory failure with hypoxia: Secondary | ICD-10-CM | POA: Diagnosis not present

## 2018-06-09 DIAGNOSIS — E1122 Type 2 diabetes mellitus with diabetic chronic kidney disease: Secondary | ICD-10-CM | POA: Diagnosis not present

## 2018-06-09 DIAGNOSIS — K250 Acute gastric ulcer with hemorrhage: Secondary | ICD-10-CM | POA: Diagnosis not present

## 2018-06-09 DIAGNOSIS — I5042 Chronic combined systolic (congestive) and diastolic (congestive) heart failure: Secondary | ICD-10-CM | POA: Diagnosis not present

## 2018-06-11 DIAGNOSIS — E1122 Type 2 diabetes mellitus with diabetic chronic kidney disease: Secondary | ICD-10-CM | POA: Diagnosis not present

## 2018-06-11 DIAGNOSIS — J9611 Chronic respiratory failure with hypoxia: Secondary | ICD-10-CM | POA: Diagnosis not present

## 2018-06-11 DIAGNOSIS — N183 Chronic kidney disease, stage 3 (moderate): Secondary | ICD-10-CM | POA: Diagnosis not present

## 2018-06-11 DIAGNOSIS — K250 Acute gastric ulcer with hemorrhage: Secondary | ICD-10-CM | POA: Diagnosis not present

## 2018-06-11 DIAGNOSIS — I5042 Chronic combined systolic (congestive) and diastolic (congestive) heart failure: Secondary | ICD-10-CM | POA: Diagnosis not present

## 2018-06-11 DIAGNOSIS — I1310 Hypertensive heart and chronic kidney disease without heart failure, with stage 1 through stage 4 chronic kidney disease, or unspecified chronic kidney disease: Secondary | ICD-10-CM | POA: Diagnosis not present

## 2018-06-15 DIAGNOSIS — I5042 Chronic combined systolic (congestive) and diastolic (congestive) heart failure: Secondary | ICD-10-CM | POA: Diagnosis not present

## 2018-06-15 DIAGNOSIS — N183 Chronic kidney disease, stage 3 (moderate): Secondary | ICD-10-CM | POA: Diagnosis not present

## 2018-06-15 DIAGNOSIS — E1122 Type 2 diabetes mellitus with diabetic chronic kidney disease: Secondary | ICD-10-CM | POA: Diagnosis not present

## 2018-06-15 DIAGNOSIS — K250 Acute gastric ulcer with hemorrhage: Secondary | ICD-10-CM | POA: Diagnosis not present

## 2018-06-15 DIAGNOSIS — J9611 Chronic respiratory failure with hypoxia: Secondary | ICD-10-CM | POA: Diagnosis not present

## 2018-06-15 DIAGNOSIS — I1310 Hypertensive heart and chronic kidney disease without heart failure, with stage 1 through stage 4 chronic kidney disease, or unspecified chronic kidney disease: Secondary | ICD-10-CM | POA: Diagnosis not present

## 2018-06-17 DIAGNOSIS — I1310 Hypertensive heart and chronic kidney disease without heart failure, with stage 1 through stage 4 chronic kidney disease, or unspecified chronic kidney disease: Secondary | ICD-10-CM | POA: Diagnosis not present

## 2018-06-17 DIAGNOSIS — K250 Acute gastric ulcer with hemorrhage: Secondary | ICD-10-CM | POA: Diagnosis not present

## 2018-06-17 DIAGNOSIS — E1122 Type 2 diabetes mellitus with diabetic chronic kidney disease: Secondary | ICD-10-CM | POA: Diagnosis not present

## 2018-06-17 DIAGNOSIS — J9611 Chronic respiratory failure with hypoxia: Secondary | ICD-10-CM | POA: Diagnosis not present

## 2018-06-17 DIAGNOSIS — N183 Chronic kidney disease, stage 3 (moderate): Secondary | ICD-10-CM | POA: Diagnosis not present

## 2018-06-17 DIAGNOSIS — I5042 Chronic combined systolic (congestive) and diastolic (congestive) heart failure: Secondary | ICD-10-CM | POA: Diagnosis not present

## 2018-06-22 DIAGNOSIS — I1310 Hypertensive heart and chronic kidney disease without heart failure, with stage 1 through stage 4 chronic kidney disease, or unspecified chronic kidney disease: Secondary | ICD-10-CM | POA: Diagnosis not present

## 2018-06-22 DIAGNOSIS — N183 Chronic kidney disease, stage 3 (moderate): Secondary | ICD-10-CM | POA: Diagnosis not present

## 2018-06-22 DIAGNOSIS — I5042 Chronic combined systolic (congestive) and diastolic (congestive) heart failure: Secondary | ICD-10-CM | POA: Diagnosis not present

## 2018-06-22 DIAGNOSIS — K250 Acute gastric ulcer with hemorrhage: Secondary | ICD-10-CM | POA: Diagnosis not present

## 2018-06-22 DIAGNOSIS — J9611 Chronic respiratory failure with hypoxia: Secondary | ICD-10-CM | POA: Diagnosis not present

## 2018-06-22 DIAGNOSIS — E1122 Type 2 diabetes mellitus with diabetic chronic kidney disease: Secondary | ICD-10-CM | POA: Diagnosis not present

## 2018-06-24 ENCOUNTER — Telehealth: Payer: Self-pay | Admitting: *Deleted

## 2018-06-24 DIAGNOSIS — J9611 Chronic respiratory failure with hypoxia: Secondary | ICD-10-CM | POA: Diagnosis not present

## 2018-06-24 DIAGNOSIS — I1310 Hypertensive heart and chronic kidney disease without heart failure, with stage 1 through stage 4 chronic kidney disease, or unspecified chronic kidney disease: Secondary | ICD-10-CM | POA: Diagnosis not present

## 2018-06-24 DIAGNOSIS — N183 Chronic kidney disease, stage 3 (moderate): Secondary | ICD-10-CM | POA: Diagnosis not present

## 2018-06-24 DIAGNOSIS — I5042 Chronic combined systolic (congestive) and diastolic (congestive) heart failure: Secondary | ICD-10-CM | POA: Diagnosis not present

## 2018-06-24 DIAGNOSIS — E1122 Type 2 diabetes mellitus with diabetic chronic kidney disease: Secondary | ICD-10-CM | POA: Diagnosis not present

## 2018-06-24 DIAGNOSIS — K250 Acute gastric ulcer with hemorrhage: Secondary | ICD-10-CM | POA: Diagnosis not present

## 2018-06-24 NOTE — Telephone Encounter (Signed)
Received Home Health Certification and Plan of Care; forwarded to provider/SLS 08/08  [facility] forwarded to provider with [facility] notes/SLS 08/08

## 2018-06-25 ENCOUNTER — Ambulatory Visit (INDEPENDENT_AMBULATORY_CARE_PROVIDER_SITE_OTHER): Payer: Medicare Other | Admitting: Cardiology

## 2018-06-25 ENCOUNTER — Encounter: Payer: Self-pay | Admitting: Cardiology

## 2018-06-25 VITALS — BP 122/62 | HR 95 | Ht 62.0 in | Wt 168.8 lb

## 2018-06-25 DIAGNOSIS — I25118 Atherosclerotic heart disease of native coronary artery with other forms of angina pectoris: Secondary | ICD-10-CM | POA: Diagnosis not present

## 2018-06-25 DIAGNOSIS — E785 Hyperlipidemia, unspecified: Secondary | ICD-10-CM | POA: Diagnosis not present

## 2018-06-25 DIAGNOSIS — I110 Hypertensive heart disease with heart failure: Secondary | ICD-10-CM | POA: Diagnosis not present

## 2018-06-25 DIAGNOSIS — I5032 Chronic diastolic (congestive) heart failure: Secondary | ICD-10-CM | POA: Diagnosis not present

## 2018-06-25 NOTE — Patient Instructions (Signed)

## 2018-06-25 NOTE — Progress Notes (Signed)
Cardiology Office Note:    Date:  06/25/2018   ID:  Margaret Ward, DOB 12/19/1940, MRN [id number]  PCP:  Sharlene Dory, DO  Cardiologist:  Norman Herrlich, MD    Referring MD: Sharlene Dory*    ASSESSMENT:    1. Coronary artery disease of native artery of native heart with stable angina pectoris (HCC)   2. Chronic diastolic heart failure (HCC)   3. Hypertensive heart disease with heart failure (HCC)   4. Hyperlipidemia, unspecified hyperlipidemia type    PLAN:    In order of problems listed above:  1. Finally she is improved from both bypass surgery and PCI and is back on single antiplatelet agent clopidogrel as she tolerates she is having no anginal discomfort and will continue her current medical therapy. 2. Stable compensated continue her current loop diuretic 3. Stable blood pressure continue current treatment 4. Stable lipids are at target continue statin   Next appointment: 6 months   Medication Adjustments/Labs and Tests Ordered: Current medicines are reviewed at length with the patient today.  Concerns regarding medicines are outlined above.  No orders of the defined types were placed in this encounter.  No orders of the defined types were placed in this encounter.   Chief Complaint  Patient presents with  . 1 month follow up  . Coronary Artery Disease  . Congestive Heart Failure    History of Present Illness:    Margaret Ward is a 77 y.o. female with a hx of CAD, CABG with NonSTEMI 03/01/2018 with PCI and stent  heart failure CKD and recent [facility] admission with acute GI bleed with gastric ulcer last seen 04/29/2018.  She required 5 units of PRBC's and her dual antiplatelet therapy was discontinued with recurrent bleeding while hospitalized last seen 06/07/18 and had been restarted on clopidogrel. Compliance with diet, lifestyle and medications: Yes  She is slowly and steadily improving no recurrent bleeding tolerates clopidogrel no  angina dyspnea edema palpitation or syncope.  She is feeling so well but she is considering returning back to [place] Past Medical History:  Diagnosis Date  . Anemia   . Arthritis    "hands, arms, all over the place" (03/02/2018)  . CAD in native artery    a. s/p CABG in 01/2018. b. Recurrent CP 02/2018 -> cath showing early graft closure SVG-diagonal, received DES to D1.  . Chronic combined systolic and diastolic CHF (congestive heart failure) (HCC)    a. LVEF 40-45% by echo 02/2018.  Marland Kitchen Chronic lower back pain   . CKD (chronic kidney disease), stage III (HCC)   . GERD (gastroesophageal reflux disease)   . Glass eye    right eye  . Gout    "on daily RX" (03/02/2018)  . History of kidney stones   . Hyperlipidemia   . Hypertension   . Hypertensive heart disease   . Kidney cysts   . Kidney disease   . Left ventricular aneurysm   . Migraine    "used to get them twice/week; haven't had them in a long time" (03/02/2018)  . NSTEMI (non-ST elevated myocardial infarction) (HCC) 03/01/2018  . On home oxygen therapy    "3L at night and prn during the day" (03/02/2018)  . Pneumonia    used to get it twice/year; stopped after I had hysterectomy" (03/02/2018)  . Sleep apnea   . Type II diabetes mellitus (HCC)     Past Surgical History:  Procedure Laterality Date  . BACK SURGERY    .  BIOPSY  05/22/2018   Procedure: BIOPSY;  Surgeon: Kathi DerBrahmbhatt, Parag, MD;  Location: [facility];  Service: Gastroenterology;;  . CARDIAC CATHETERIZATION  12/2017   "before OHS"  . CHOLECYSTECTOMY OPEN  1982  . CORONARY ARTERY BYPASS GRAFT  01/19/2018   [place]  . CORONARY STENT INTERVENTION N/A 03/02/2018   Procedure: CORONARY STENT INTERVENTION;  Surgeon: Kathleene HazelMcAlhany, Christopher D, MD;  Location: [facility];  Service: Cardiovascular;  Laterality: N/A;  . DILATION AND CURETTAGE OF UTERUS    . ESOPHAGOGASTRODUODENOSCOPY (EGD) WITH PROPOFOL N/A 05/22/2018   Procedure:  ESOPHAGOGASTRODUODENOSCOPY (EGD) WITH PROPOFOL;  Surgeon: Kathi DerBrahmbhatt, Parag, MD;  Location: [facility];  Service: Gastroenterology;  Laterality: N/A;  . ESOPHAGOGASTRODUODENOSCOPY (EGD) WITH PROPOFOL N/A 05/24/2018   Procedure: ESOPHAGOGASTRODUODENOSCOPY (EGD) WITH PROPOFOL;  Surgeon: Kathi DerBrahmbhatt, Parag, MD;  Location: [facility];  Service: Gastroenterology;  Laterality: N/A;  . 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 KNEE ARTHROSCOPY Right X 4  . LEFT HEART CATH AND CORS/GRAFTS ANGIOGRAPHY N/A 03/02/2018   Procedure: LEFT HEART CATH AND CORS/GRAFTS ANGIOGRAPHY;  Surgeon: Kathleene HazelMcAlhany, Christopher D, MD;  Location: [facility];  Service: Cardiovascular;  Laterality: N/A;  . LUMBAR DISC SURGERY    . REDUCTION MAMMAPLASTY Bilateral X 2  . SCHLEROTHERAPY  05/24/2018   Procedure: Theresia MajorsSCHLEROTHERAPY;  Surgeon: Kathi DerBrahmbhatt, Parag, MD;  Location: [facility];  Service: Gastroenterology;;  . TONSILLECTOMY  1953  . VAGINAL HYSTERECTOMY  1970    Current Medications: Current Meds  Medication Sig  . acetaminophen (TYLENOL) 500 MG tablet Take 500 mg by mouth every 6 (six) hours as needed for headache.  . albuterol (PROVENTIL HFA;VENTOLIN HFA) 108 (90 Base) MCG/ACT inhaler Inhale 2 puffs into the lungs every 6 (six) hours as needed for wheezing or shortness of breath.  . allopurinol (ZYLOPRIM) 100 MG tablet Take 1 tablet (100 mg total) by mouth daily.  Marland Kitchen. atorvastatin (LIPITOR) 40 MG tablet Take 40 mg by mouth every evening.   . clopidogrel (PLAVIX) 75 MG tablet Take 1 tablet (75 mg total) by mouth daily. Restart This on Monday  . furosemide (LASIX) 40 MG tablet Take 2 tablets (80 mg total) by mouth daily.  . Insulin Glargine (LANTUS) 100 UNIT/ML Solostar Pen Inject 4 Units into the skin at bedtime.  . Melatonin 10 MG TABS Take 10 mg by mouth at bedtime.   . metoprolol tartrate (LOPRESSOR) 25 MG tablet Take 0.5 tablets (12.5 mg total) by mouth 2 (two) times daily.  . nitroGLYCERIN (NITROSTAT) 0.4 MG  SL tablet Place 1 tablet (0.4 mg total) under the tongue every 5 (five) minutes x 3 doses as needed for chest pain.  . pantoprazole (PROTONIX) 40 MG tablet Take 1 tablet (40 mg total) by mouth 2 (two) times daily.  . potassium chloride (KLOR-CON 10) 10 MEQ tablet Take 2 tabs with every dose of Lasix.  Marland Kitchen. vitamin C (ASCORBIC ACID) 500 MG tablet Take 500 mg by mouth 2 (two) times daily.     Allergies:   Methylprednisolone; Keflex [cephalexin]; Ketorolac; Morphine and related; Novocain [procaine]; and Septra [sulfamethoxazole-trimethoprim]   Social History   Socioeconomic History  . Marital status: Married    Spouse name: Not on file  . Number of children: Not on file  . Years of education: Not on file  . Highest education level: Not on file  Occupational History  . Not on file  Social Needs  . Financial resource strain: Not on file  . Food  insecurity:    Worry: Not on file    Inability: Not on file  . Transportation needs:    Medical: Not on file    Non-medical: Not on file  Tobacco Use  . Smoking status: Former Smoker    Packs/day: 0.12    Years: 3.00    Pack years: 0.36    Types: Cigarettes    Last attempt to quit: 1963    Years since quitting: 56.6  . Smokeless tobacco: Never Used  Substance and Sexual Activity  . Alcohol use: Not Currently    Frequency: Never  . Drug use: Never  . Sexual activity: Not on file  Lifestyle  . Physical activity:    Days per week: Not on file    Minutes per session: Not on file  . Stress: Not on file  Relationships  . Social connections:    Talks on phone: Not on file    Gets together: Not on file    Attends religious service: Not on file    Active member of club or organization: Not on file    Attends meetings of clubs or organizations: Not on file    Relationship status: Not on file  Other Topics Concern  . Not on file  Social History Narrative  . Not on file     Family History: The patient's family history includes Diabetes  in her father; Heart Problems in her mother; Heart disease in her brother; Hypertension in her brother, daughter, daughter, daughter, and daughter; Kidney disease in her brother and sister. ROS:   Please see the history of present illness.    All other systems reviewed and are negative.  EKGs/Labs/Other Studies Reviewed:    The following studies were reviewed today:   Recent Labs: 04/29/2018: NT-Pro BNP 1,843 05/21/2018: B Natriuretic Peptide 68.8 05/22/2018: ALT 16 05/26/2018: BUN 22; Creatinine, Ser 1.24; Potassium 3.7; Sodium 145 06/07/2018: Hemoglobin 11.3; Platelets 249.0  Recent Lipid Panel    Component Value Date/Time   CHOL 88 (L) 04/29/2018 0905   TRIG 73 04/29/2018 0905   HDL 50 04/29/2018 0905   CHOLHDL 1.8 04/29/2018 0905   LDLCALC 23 04/29/2018 0905    Physical Exam:    VS:  BP 122/62   Pulse 95   Ht 5\' 2"  (1.575 m)   Wt 168 lb 12.8 oz (76.6 kg)   SpO2 96%   BMI 30.87 kg/m     Wt Readings from Last 3 Encounters:  06/25/18 168 lb 12.8 oz (76.6 kg)  06/07/18 173 lb (78.5 kg)  06/07/18 173 lb (78.5 kg)     GEN:  Well nourished, well developed in no acute distress HEENT: Normal NECK: No JVD; No carotid bruits LYMPHATICS: No lymphadenopathy CARDIAC: RRR, no murmurs, rubs, gallops RESPIRATORY:  Clear to auscultation without rales, wheezing or rhonchi  ABDOMEN: Soft, non-tender, non-distended MUSCULOSKELETAL:  No edema; No deformity  SKIN: Warm and dry NEUROLOGIC:  Alert and oriented x 3 PSYCHIATRIC:  Normal affect    Signed, Norman Herrlich, MD  06/25/2018 4:15 PM    [facility]

## 2018-06-28 ENCOUNTER — Other Ambulatory Visit: Payer: Self-pay

## 2018-06-28 MED ORDER — METOPROLOL TARTRATE 25 MG PO TABS: 12.5000 mg | ORAL_TABLET | Freq: Two times a day (BID) | ORAL | 2 refills | Status: DC

## 2018-06-29 DIAGNOSIS — E1122 Type 2 diabetes mellitus with diabetic chronic kidney disease: Secondary | ICD-10-CM | POA: Diagnosis not present

## 2018-06-29 DIAGNOSIS — K250 Acute gastric ulcer with hemorrhage: Secondary | ICD-10-CM | POA: Diagnosis not present

## 2018-06-29 DIAGNOSIS — J9611 Chronic respiratory failure with hypoxia: Secondary | ICD-10-CM | POA: Diagnosis not present

## 2018-06-29 DIAGNOSIS — I1310 Hypertensive heart and chronic kidney disease without heart failure, with stage 1 through stage 4 chronic kidney disease, or unspecified chronic kidney disease: Secondary | ICD-10-CM | POA: Diagnosis not present

## 2018-06-29 DIAGNOSIS — N183 Chronic kidney disease, stage 3 (moderate): Secondary | ICD-10-CM | POA: Diagnosis not present

## 2018-06-29 DIAGNOSIS — I5042 Chronic combined systolic (congestive) and diastolic (congestive) heart failure: Secondary | ICD-10-CM | POA: Diagnosis not present

## 2018-07-07 DIAGNOSIS — N183 Chronic kidney disease, stage 3 (moderate): Secondary | ICD-10-CM | POA: Diagnosis not present

## 2018-07-07 DIAGNOSIS — I1310 Hypertensive heart and chronic kidney disease without heart failure, with stage 1 through stage 4 chronic kidney disease, or unspecified chronic kidney disease: Secondary | ICD-10-CM | POA: Diagnosis not present

## 2018-07-07 DIAGNOSIS — K250 Acute gastric ulcer with hemorrhage: Secondary | ICD-10-CM | POA: Diagnosis not present

## 2018-07-07 DIAGNOSIS — J9611 Chronic respiratory failure with hypoxia: Secondary | ICD-10-CM | POA: Diagnosis not present

## 2018-07-07 DIAGNOSIS — I5042 Chronic combined systolic (congestive) and diastolic (congestive) heart failure: Secondary | ICD-10-CM | POA: Diagnosis not present

## 2018-07-07 DIAGNOSIS — E1122 Type 2 diabetes mellitus with diabetic chronic kidney disease: Secondary | ICD-10-CM | POA: Diagnosis not present

## 2018-07-08 ENCOUNTER — Ambulatory Visit: Payer: Medicare Other | Admitting: Family Medicine

## 2018-07-26 ENCOUNTER — Ambulatory Visit: Payer: Medicare Other | Admitting: Family Medicine

## 2018-08-04 DIAGNOSIS — D500 Iron deficiency anemia secondary to blood loss (chronic): Secondary | ICD-10-CM | POA: Diagnosis not present

## 2018-08-04 DIAGNOSIS — K3189 Other diseases of stomach and duodenum: Secondary | ICD-10-CM | POA: Diagnosis not present

## 2018-08-04 DIAGNOSIS — R197 Diarrhea, unspecified: Secondary | ICD-10-CM | POA: Diagnosis not present

## 2018-08-04 DIAGNOSIS — Z8679 Personal history of other diseases of the circulatory system: Secondary | ICD-10-CM | POA: Diagnosis not present

## 2018-08-04 DIAGNOSIS — K449 Diaphragmatic hernia without obstruction or gangrene: Secondary | ICD-10-CM | POA: Diagnosis not present

## 2018-08-04 DIAGNOSIS — Z9049 Acquired absence of other specified parts of digestive tract: Secondary | ICD-10-CM | POA: Diagnosis not present

## 2018-08-11 ENCOUNTER — Telehealth: Payer: Self-pay

## 2018-08-11 NOTE — Telephone Encounter (Signed)
Copied from CRM [id number]. Topic: Inquiry >> Aug 10, 2018  9:06 AM Margaret Ward, Margaret wrote: Reason for CRM :  Patient is calling to request if her appt that she has schedule on 10-4 with Dr. Carmelia RollerWendling, can be combined with her husband Margaret Ward Victoria [id number]. His  appt for his 3 month follow up is schedule for  10-10 at 8:30 am also with Dr.Wendling. She stated her daughter is their transportation and hoping they can come on the same day. She is requesting a call back in regards to this request. Please advise

## 2018-08-11 NOTE — Telephone Encounter (Signed)
Sure

## 2018-08-12 NOTE — Telephone Encounter (Signed)
Author phoned Fredia Sorrow and offered to make 10/10 appointments for both she and husband, Dr. Carmelia Roller approved. Appointment made for 10/10 at 0815AM.

## 2018-08-20 ENCOUNTER — Ambulatory Visit: Payer: Medicare Other | Admitting: Family Medicine

## 2018-08-26 ENCOUNTER — Ambulatory Visit (INDEPENDENT_AMBULATORY_CARE_PROVIDER_SITE_OTHER): Payer: Medicare Other | Admitting: Family Medicine

## 2018-08-26 ENCOUNTER — Encounter: Payer: Self-pay | Admitting: Family Medicine

## 2018-08-26 VITALS — BP 122/86 | HR 71 | Temp 98.3°F | Ht 62.0 in | Wt 167.4 lb

## 2018-08-26 DIAGNOSIS — R58 Hemorrhage, not elsewhere classified: Secondary | ICD-10-CM

## 2018-08-26 DIAGNOSIS — Z23 Encounter for immunization: Secondary | ICD-10-CM | POA: Diagnosis not present

## 2018-08-26 DIAGNOSIS — E1169 Type 2 diabetes mellitus with other specified complication: Secondary | ICD-10-CM | POA: Diagnosis not present

## 2018-08-26 DIAGNOSIS — E669 Obesity, unspecified: Secondary | ICD-10-CM | POA: Diagnosis not present

## 2018-08-26 LAB — COMPREHENSIVE METABOLIC PANEL
ALBUMIN: 3.8 g/dL (ref 3.5–5.2)
ALT: 16 U/L (ref 0–35)
AST: 17 U/L (ref 0–37)
Alkaline Phosphatase: 94 U/L (ref 39–117)
BUN: 42 mg/dL — AB (ref 6–23)
CHLORIDE: 105 meq/L (ref 96–112)
CO2: 27 mEq/L (ref 19–32)
CREATININE: 1.36 mg/dL — AB (ref 0.40–1.20)
Calcium: 9.1 mg/dL (ref 8.4–10.5)
GFR: 39.99 mL/min — ABNORMAL LOW (ref >60.00)
Glucose, Bld: 144 mg/dL — ABNORMAL HIGH (ref 70–99)
Potassium: 3.7 mEq/L (ref 3.5–5.1)
SODIUM: 142 meq/L (ref 135–145)
TOTAL PROTEIN: 6.9 g/dL (ref 6.0–8.3)
Total Bilirubin: 0.7 mg/dL (ref 0.2–1.2)

## 2018-08-26 LAB — LIPID PANEL
CHOLESTEROL: 100 mg/dL (ref 0–200)
HDL: 47.9 mg/dL (ref >39.00)
LDL CALC: 34 mg/dL (ref 0–99)
NonHDL: 52.43
TRIGLYCERIDES: 93 mg/dL (ref 0.0–149.0)
Total CHOL/HDL Ratio: 2
VLDL: 18.6 mg/dL (ref 0.0–40.0)

## 2018-08-26 LAB — HEMOGLOBIN A1C: HEMOGLOBIN A1C: 7 % — AB (ref 4.6–6.5)

## 2018-08-26 MED ORDER — CLOPIDOGREL BISULFATE 75 MG PO TABS: 75.0000 mg | ORAL_TABLET | Freq: Every day | ORAL | 1 refills | Status: DC

## 2018-08-26 NOTE — Progress Notes (Signed)
Subjective:   Chief Complaint  Patient presents with  . Follow-up    Margaret Ward is a 77 y.o. female here for follow-up of diabetes.   Margaret Ward's self monitored glucose range is low to to mid 100's Patient denies hypoglycemic reactions. She checks her glucose levels 1 times per day. Patient does not require insulin.   Medications include: diet controlled Exercise: some walking  Past Medical History:  Diagnosis Date  . Anemia   . Arthritis    "hands, arms, all over the place" (03/02/2018)  . CAD in native artery    a. s/p CABG in 01/2018. b. Recurrent CP 02/2018 -> cath showing early graft closure SVG-diagonal, received DES to D1.  . Chronic combined systolic and diastolic CHF (congestive heart failure) (HCC)    a. LVEF 40-45% by echo 02/2018.  Marland Kitchen Chronic lower back pain   . CKD (chronic kidney disease), stage III (HCC)   . GERD (gastroesophageal reflux disease)   . Glass eye    right eye  . Gout    "on daily RX" (03/02/2018)  . History of kidney stones   . Hyperlipidemia   . Hypertension   . Hypertensive heart disease   . Kidney cysts   . Kidney disease   . Left ventricular aneurysm   . Migraine    "used to get them twice/week; haven't had them in a long time" (03/02/2018)  . NSTEMI (non-ST elevated myocardial infarction) (HCC) 03/01/2018  . On home oxygen therapy    "3L at night and prn during the day" (03/02/2018)  . Pneumonia    used to get it twice/year; stopped after I had hysterectomy" (03/02/2018)  . Sleep apnea   . Type II diabetes mellitus (HCC)      Related testing: Date of retinal exam: 12/2017 Pneumovax: will do today Flu Shot: done  Review of Systems: Pulmonary:  No SOB Cardiovascular:  No chest pain  Objective:  BP 122/86 (BP Location: Left Arm, Patient Position: Sitting, Cuff Size: Normal)   Pulse 71   Temp 98.3 F (36.8 C) (Oral)   Ht 5\' 2"  (1.575 m)   Wt 167 lb 6 oz (75.9 kg)   SpO2 96%   BMI 30.61 kg/m  General:  Well developed, well  nourished, in no apparent distress Skin:  Warm, no pallor or diaphoresis Head:  Normocephalic, atraumatic Eyes:  Pupils equal and round, sclera anicteric without injection  Lungs:  CTAB, no access msc use Cardio:  RRR, no bruits, no LE edema Musculoskeletal:  Symmetrical muscle groups noted without atrophy or deformity Neuro:  Sensation intact to pinprick on feet Psych: Age appropriate judgment and insight  Assessment:   Diabetes mellitus type 2 in obese (HCC) - Plan: Lipid panel, Hemoglobin A1c, Comprehensive metabolic panel, CANCELED: Microalbumin / creatinine urine ratio  Need for influenza vaccination - Plan: Flu vaccine HIGH DOSE PF (Fluzone High dose)  Need for vaccination against Streptococcus pneumoniae - Plan: Pneumococcal polysaccharide vaccine 23-valent greater than or equal to 2yo subcutaneous/IM  Ecchymosis   Plan:   Orders as above. She is good to come off of insulin completely. OK to stop checking sugars.  Reassurance given with ecchymosis.  Likely secondary to increased capillary fragility in addition to being on Plavix. Counseled on diet and exercise. F/u in 6 mo. The patient voiced understanding and agreement to the plan.  Jilda Roche Portis, DO 08/26/18 12:03 PM

## 2018-08-26 NOTE — Patient Instructions (Addendum)
Keep up the good work.  Give [place] 2-3 business days to get the results of your labs back.   You can stop checking your sugars if you want.  Let us know if you need anything.

## 2018-08-26 NOTE — Progress Notes (Signed)
Pre visit review using our clinic review tool, if applicable. No additional management support is needed unless otherwise documented below in the visit note. 

## 2018-09-03 ENCOUNTER — Other Ambulatory Visit: Payer: Self-pay | Admitting: Family Medicine

## 2018-10-13 ENCOUNTER — Other Ambulatory Visit: Payer: Self-pay | Admitting: Family Medicine

## 2018-10-27 ENCOUNTER — Other Ambulatory Visit: Payer: Self-pay | Admitting: Cardiothoracic Surgery

## 2018-10-27 DIAGNOSIS — R918 Other nonspecific abnormal finding of lung field: Secondary | ICD-10-CM

## 2018-11-04 ENCOUNTER — Other Ambulatory Visit: Payer: Self-pay | Admitting: Family Medicine

## 2018-12-07 ENCOUNTER — Other Ambulatory Visit: Payer: Self-pay | Admitting: Family Medicine

## 2018-12-07 ENCOUNTER — Other Ambulatory Visit: Payer: Self-pay

## 2018-12-07 DIAGNOSIS — I5032 Chronic diastolic (congestive) heart failure: Secondary | ICD-10-CM

## 2018-12-07 DIAGNOSIS — I110 Hypertensive heart disease with heart failure: Secondary | ICD-10-CM

## 2018-12-07 NOTE — Telephone Encounter (Signed)
Copied from CRM [id number]. Topic: Quick Communication - Rx Refill/Question >> Dec 07, 2018  9:26 AM Gerrianne Scale wrote: Medication: furosemide (LASIX) 40 MG tablet  Has the patient contacted their pharmacy? Yes.   (Agent: If no, request that the patient contact the pharmacy for the refill.) (Agent: If yes, when and what did the pharmacy advise?) called last Friday states that the provider refused the medicine  Preferred Pharmacy (with phone number or street name):   [id number] - [place], [place] - [address] AT [facility] [address] [phone] (Phone) [phone] (Fax)    Agent: Please be advised that RX refills may take up to 3 business days. We ask that you follow-up with your pharmacy.

## 2018-12-07 NOTE — Telephone Encounter (Signed)
Pt calling for iron refill

## 2018-12-09 ENCOUNTER — Other Ambulatory Visit: Payer: Medicare Other

## 2018-12-09 ENCOUNTER — Ambulatory Visit: Payer: Medicare Other | Admitting: Cardiothoracic Surgery

## 2018-12-23 ENCOUNTER — Other Ambulatory Visit: Payer: Self-pay

## 2018-12-23 ENCOUNTER — Ambulatory Visit (INDEPENDENT_AMBULATORY_CARE_PROVIDER_SITE_OTHER): Payer: Medicare Other | Admitting: Cardiothoracic Surgery

## 2018-12-23 ENCOUNTER — Ambulatory Visit
Admission: RE | Admit: 2018-12-23 | Discharge: 2018-12-23 | Disposition: A | Discharge status: Home / Self Care | Payer: Medicare Other | Source: Ambulatory Visit | Attending: Cardiothoracic Surgery | Admitting: Cardiothoracic Surgery

## 2018-12-23 ENCOUNTER — Encounter: Payer: Self-pay | Admitting: Cardiothoracic Surgery

## 2018-12-23 VITALS — BP 139/79 | HR 76 | Resp 16 | Ht 62.0 in | Wt 178.2 lb

## 2018-12-23 DIAGNOSIS — R911 Solitary pulmonary nodule: Secondary | ICD-10-CM

## 2018-12-23 DIAGNOSIS — Z951 Presence of aortocoronary bypass graft: Secondary | ICD-10-CM

## 2018-12-23 DIAGNOSIS — R918 Other nonspecific abnormal finding of lung field: Secondary | ICD-10-CM

## 2018-12-23 NOTE — Progress Notes (Signed)
[address]       [place] [address]             [phone]                    [name] [facility] Medical Record #[id number] Date of Birth: Sep 08, 1941  Referring: Baldo Daub, MD Primary Care: Sharlene Dory, DO Primary Cardiologist: Norman Herrlich, MD  Chief Complaint:    Chief Complaint  Patient presents with  . Routine Post Op    8 month f/u with CT CHEST to assess RML nodule  . Lung Lesion    History of Present Illness:    Margaret Ward 78 y.o. female is seen in the office as a new patient following coronary artery bypass grafting in early March 2019 in [place],  at the time the patient was living in [place].  She began having severe anginal symptoms primarily manifest by shortness of breath and left arm pain she was transferred to [facility] to [facility] underwent cardiac catheterization, and ultimately coronary artery bypass grafting x3, off-pump with left internal mammary to the left anterior descending coronary artery reverse saphenous vein graft to the ramus and reverse saphenous vein graft to the posterior descending.  Postoperative the patient was in the hospital 5 to 6 days and was discharged home.  She then came to [facility] for recuperation because of her family lives here.  On April 16th about 5 weeks postop she began having similar anginal symptoms was admitted with a non-STEMI myocardial infarction, was taken to the Cath Lab and a angioplasty and drug-eluting stent was placed in the intermediate vessel.  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  Patient currently walks with a cane, she notes numbness in both hands over the past  month.    Current Activity/ Functional Status:  Patient is independent with mobility/ambulation, transfers, ADL's, IADL's.   Zubrod Score: At the time of surgery this patient's most appropriate activity status/level should be described as: []     0    Normal activity, no symptoms [x]     1    Restricted in physical strenuous activity but ambulatory, able to do out light work []     2    Ambulatory and capable of self care, unable to do work activities, up and about               >50 % of waking hours                              []     3    Only limited self care, in bed greater than 50% of waking hours []     4    Completely disabled, no self care, confined to bed or chair []     5    Moribund   Past Medical History:  Diagnosis Date  . Anemia   . Arthritis    "hands, arms, all over the place" (03/02/2018)  . CAD in native artery    a. s/p CABG in 01/2018. b. Recurrent CP 02/2018 -> cath showing early graft  closure SVG-diagonal, received DES to D1.  . Chronic combined systolic and diastolic CHF (congestive heart failure) (HCC)    a. LVEF 40-45% by echo 02/2018.  Marland Kitchen Chronic lower back pain   . CKD (chronic kidney disease), stage III (HCC)   . GERD (gastroesophageal reflux disease)   . Glass eye    right eye  . Gout    "on daily RX" (03/02/2018)  . History of kidney stones   . Hyperlipidemia   . Hypertension   . Hypertensive heart disease   . Kidney cysts   . Kidney disease   . Left ventricular aneurysm   . Migraine    "used to get them twice/week; haven't had them in a long time" (03/02/2018)  . NSTEMI (non-ST elevated myocardial infarction) (HCC) 03/01/2018  . On home oxygen therapy    "3L at night and prn during the day" (03/02/2018)  . Pneumonia    used to get it twice/year; stopped after I had hysterectomy" (03/02/2018)  . Sleep apnea   . Type II diabetes mellitus (HCC)     Past Surgical History:  Procedure Laterality Date  . BACK SURGERY    . BIOPSY  05/22/2018   Procedure:  BIOPSY;  Surgeon: Kathi Der, MD;  Location: [facility] ENDOSCOPY;  Service: Gastroenterology;;  . CARDIAC CATHETERIZATION  12/2017   "before OHS"  . CHOLECYSTECTOMY OPEN  1982  . CORONARY ARTERY BYPASS GRAFT  01/19/2018   "CABG X3; in [place]"  . CORONARY STENT INTERVENTION N/A 03/02/2018   Procedure: CORONARY STENT INTERVENTION;  Surgeon: Kathleene Hazel, MD;  Location: [facility] INVASIVE CV LAB;  Service: Cardiovascular;  Laterality: N/A;  . DILATION AND CURETTAGE OF UTERUS    . ESOPHAGOGASTRODUODENOSCOPY (EGD) WITH PROPOFOL N/A 05/22/2018   Procedure: ESOPHAGOGASTRODUODENOSCOPY (EGD) WITH PROPOFOL;  Surgeon: Kathi Der, MD;  Location: [facility] ENDOS[facility];  Service: Gastroenterology;  Laterality: N/A;  . ESOPHAGOGASTRODUODENOSCOPY (EGD) WITH PROPOFOL N/A 05/24/2018   Procedure: ESOPHAGOGASTRODUODENOSCOPY (EGD) WITH PROPOFOL;  Surgeon: Kathi Der, MD;  Location: [facility] END[facility];  Service: Gastroenterology;  Laterality: N/A;  . 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 KNEE ARTHROSCOPY Right X 4  . LEFT HEART CATH AND CORS/GRAFTS ANGIOGRAPHY N/A 03/02/2018   Procedure: LEFT HEART CATH AND CORS/GRAFTS ANGIOGRAPHY;  Surgeon: Kathleene Hazel, MD;  Location: [facility] INV[facility] CV LAB;  Service: Cardiovascular;  Laterality: N/A;  . LUMBAR DISC SURGERY    . REDUCTION MAMMAPLASTY Bilateral X 2  . SCHLEROTHERAPY  05/24/2018   Procedure: Theresia Majors;  Surgeon: Kathi Der, MD;  Location: [facility] ENDOS[facility];  Service: Gastroenterology;;  . TONSILLECTOMY  1953  . VAGINAL HYSTERECTOMY  1970    Family History  Problem Relation Age of Onset  . Heart Problems Mother   . Diabetes Father   . Kidney disease Sister   . Heart disease Brother   . Hypertension Brother   . Kidney disease Brother   . Hypertension Daughter   . Hypertension Daughter   . Hypertension Daughter   . Hypertension Daughter     Social History   Socioeconomic History  . Marital status: Married    Spouse name:  Not on file  . Number of children: Not on file  . Years of education: Not on file  . Highest education level: Not on file  Occupational History  . Not on file  Social Needs  . Financial resource strain: Not on file  . Food insecurity:    Worry: Not on file    Inability:  Not on file  . Transportation needs:    Medical: Not on file    Non-medical: Not on file  Tobacco Use  . Smoking status: Former Smoker    Packs/day: 0.12    Years: 3.00    Pack years: 0.36    Types: Cigarettes    Last attempt to quit: 1963    Years since quitting: 57.1  . Smokeless tobacco: Never Used  Substance and Sexual Activity  . Alcohol use: Not Currently    Frequency: Never  . Drug use: Never  . Sexual activity: Not on file  Lifestyle  . Physical activity:    Days per week: Not on file    Minutes per session: Not on file  . Stress: Not on file  Relationships  . Social connections:    Talks on phone: Not on file    Gets together: Not on file    Attends religious service: Not on file    Active member of club or organization: Not on file    Attends meetings of clubs or organizations: Not on file    Relationship status: Not on file  . Intimate partner violence:    Fear of current or ex partner: Not on file    Emotionally abused: Not on file    Physically abused: Not on file    Forced sexual activity: Not on file  Other Topics Concern  . Not on file  Social History Narrative  . Not on file    Social History   Tobacco Use  Smoking Status Former Smoker  . Packs/day: 0.12  . Years: 3.00  . Pack years: 0.36  . Types: Cigarettes  . Last attempt to quit: 1963  . Years since quitting: 31.1  Smokeless Tobacco Never Used    Social History   Substance and Sexual Activity  Alcohol Use Not Currently  . Frequency: Never     Allergies  Allergen Reactions  . Methylprednisolone     Severe cramps legs and arms  . Keflex [Cephalexin] Hives  . Ketorolac Swelling  . Morphine And Related      Itching   . Novocain [Procaine] Swelling  . Septra [Sulfamethoxazole-Trimethoprim] Nausea And Vomiting    Current Outpatient Medications  Medication Sig Dispense Refill  . acetaminophen (TYLENOL) 500 MG tablet Take 500 mg by mouth every 6 (six) hours as needed for headache.    . allopurinol (ZYLOPRIM) 100 MG tablet Take 1 tablet (100 mg total) by mouth daily. 90 tablet 2  . atorvastatin (LIPITOR) 40 MG tablet Take 40 mg by mouth every evening.   3  . clopidogrel (PLAVIX) 75 MG tablet Take 1 tablet (75 mg total) by mouth daily. 90 tablet 1  . FEROSUL 325 (65 Fe) MG tablet TAKE ONE TABLET BY MOUTH DAILY 90 tablet 0  . furosemide (LASIX) 40 MG tablet Take 2 tablets (80 mg total) by mouth daily. 60 tablet 3  . Melatonin 10 MG TABS Take 10 mg by mouth at bedtime.     . metoprolol tartrate (LOPRESSOR) 25 MG tablet Take 0.5 tablets (12.5 mg total) by mouth 2 (two) times daily. 90 tablet 2  . nitroGLYCERIN (NITROSTAT) 0.4 MG SL tablet Place 1 tablet (0.4 mg total) under the tongue every 5 (five) minutes x 3 doses as needed for chest pain. 25 tablet 1  . pantoprazole (PROTONIX) 40 MG tablet Take 1 tablet (40 mg total) by mouth 2 (two) times daily. 60 tablet 0  . potassium chloride (K-DUR) 10 MEQ  tablet TAKE 2 TABLETS WITH EVERY DOSE OF FUROSEMIDE 60 tablet 5  . vitamin C (ASCORBIC ACID) 500 MG tablet Take 500 mg by mouth 2 (two) times daily.     No current facility-administered medications for this visit.       Review of Systems:     Cardiac Review of Systems: [Y] = yes  or   [ N ] = no   Chest Pain [  N]  Resting SOB [ N ] Exertional SOB  [Y ]  Orthopnea [Y ]   Pedal Edema Gilian.Kraft  ]    Palpitations [ N] Syncope  [ N]   Presyncope Klaus.Mock   ]   General Review of Systems: [Y] = yes [  ]=no Constitional: recent weight change [  ];  Wt loss over the last 3 months [   ] anorexia [  ]; fatigue [  ]; nausea [  ]; night sweats [  ]; fever [  ]; or chills [  ];          Dental: poor dentition[  ]; Last Dentist  visit:   Eye : blurred vision [  ]; diplopia [   ]; vision changes [  ];  Amaurosis fugax[  ]; Resp: cough [  ];  wheezing[  ];  hemoptysis[  ]; shortness of breath[  ]; paroxysmal nocturnal dyspnea[  ]; dyspnea on exertion[ y ]; or orthopnea[y  ];  GI:  gallstones[  ], vomiting[  ];  dysphagia[  ]; melena[  ];  hematochezia [  ]; heartburn[  ];   Hx of  Colonoscopy[  ]; GU: kidney stones [  ]; hematuria[  ];   dysuria [  ];  nocturia[  ];  history of     obstruction [  ]; urinary frequency [  ]             Skin: rash, swelling[  ];, hair loss[  ];  peripheral edema[  ];  or itching[  ]; Musculosketetal: myalgias[  ];  joint swelling[  ];  joint erythema[  ];  joint pain[  ];  back pain[  ];  Heme/Lymph: bruising[  ];  bleeding[  ];  anemia[  ];  Neuro: TIA[  ];  headaches[  ];  stroke[  ];  vertigo[  ];  seizures[  ];   paresthesias[  ];  difficulty walking[  ];  Psych:depression[  ]; anxiety[  ];  Endocrine: diabetes[ y ];  thyroid dysfunction[  ];  Immunizations: Flu up to date [Y]; Pneumococcal up to date [Y ];  Other:    PHYSICAL EXAMINATION: BP 139/79 (BP Location: Left Arm, Patient Position: Sitting, Cuff Size: Large)   Pulse 76   Resp 16   Ht 5\' 2"  (1.575 m)   Wt 178 lb 3.2 oz (80.8 kg)   SpO2 97% Comment: ON RA  BMI 32.59 kg/m   General appearance: alert, cooperative and no distress Head: Normocephalic, without obvious abnormality, atraumatic Neck: no adenopathy, no carotid bruit, no JVD, supple, symmetrical, trachea midline and thyroid not enlarged, symmetric, no tenderness/mass/nodules Lymph nodes: Cervical, supraclavicular, and axillary nodes normal. Resp: clear to auscultation bilaterally Back: symmetric, no curvature. ROM normal. No CVA tenderness. Cardio: regular rate and rhythm, S1, S2 normal, no murmur, click, rub or gallop GI: soft, non-tender; bowel sounds normal; no masses,  no organomegaly Extremities: extremities normal, atraumatic, no cyanosis or edema and  Homans sign is negative, no sign of DVT Neurologic: Grossly normal  diagnostic Studies & Laboratory data:     Recent Radiology Findings:  Ct Chest Wo Contrast  Result Date: 12/23/2018 CLINICAL DATA:  78 year old female with history of pulmonary nodules. Chest tightness. EXAM: CT CHEST WITHOUT CONTRAST TECHNIQUE: Multidetector CT imaging of the chest was performed following the standard protocol without IV contrast. COMPARISON:  Chest CT 03/01/2018. FINDINGS: Cardiovascular: Heart size is normal. There is no significant pericardial fluid, thickening or pericardial calcification. There is aortic atherosclerosis, as well as atherosclerosis of the great vessels of the mediastinum and the coronary arteries, including calcified atherosclerotic plaque in the left main, left anterior descending, left circumflex and right coronary arteries. Status post median sternotomy for CABG including LIMA to the LAD. Mediastinum/Nodes: No pathologically enlarged mediastinal or hilar lymph nodes. Please note that accurate exclusion of hilar adenopathy is limited on noncontrast CT scans. Large hiatal hernia. No axillary lymphadenopathy. Lungs/Pleura: Scattered areas of linear scarring are noted throughout the lung bases bilaterally, similar to the prior study. 5 mm nodule in the periphery of the right upper lobe (axial image 38 of series 6), stable compared to the prior examination, strongly favored to be benign. No other larger more suspicious appearing pulmonary nodules or masses are noted. No acute consolidative airspace disease. No pleural effusions. Upper Abdomen: Aortic atherosclerosis. Status post cholecystectomy. Numerous low-attenuation lesions in both kidneys, incompletely characterized on today's noncontrast CT examination, but similar to the prior study and statistically likely to represent cysts, largest of which measures at least 3.8 cm in the interpolar region of the right kidney. Musculoskeletal: Median sternotomy  wires. There are no aggressive appearing lytic or blastic lesions noted in the visualized portions of the skeleton. IMPRESSION: 1. Stable 5 mm right upper lobe pulmonary nodule, strongly favored to be benign. No follow-up needed if patient is low-risk. Non-contrast chest CT can be considered in 12 months if patient is high-risk. This recommendation follows the consensus statement: Guidelines for Management of Incidental Pulmonary Nodules Detected on CT Images: From the Fleischner Society 2017; Radiology 2017; 284:228-243. 2. No acute findings are noted in the thorax to account for the patient's history of chest tightness. 3. Aortic atherosclerosis, in addition to left main and 3 vessel coronary artery disease. Status post median sternotomy for CABG including LIMA to the LAD. 4. Large hiatal hernia. Aortic Atherosclerosis (ICD10-I70.0). Electronically Signed   By: Trudie Reed M.D.   On: 12/23/2018 10:06     Dg Chest 1 View  Result Date: 03/03/2018 CLINICAL DATA:  Post cath, cough. EXAM: CHEST  1 VIEW COMPARISON:  Chest radiograph 03/01/2018. FINDINGS: Cardiomegaly with calcified tortuous aorta. Median sternotomy and CABG. Linear atelectasis in both lung zones, stable. No edema or consolidation. IMPRESSION: Stable chest.  No consolidation or edema. Electronically Signed   By: Elsie Stain M.D.   On: 03/03/2018 12:24   Dg Chest 2 View  Result Date: 03/08/2018 CLINICAL DATA:  Acute onset of cough and difficulty breathing. EXAM: CHEST - 2 VIEW COMPARISON:  Chest radiograph performed 03/06/2018 FINDINGS: The lungs are well-aerated. Mild bibasilar airspace opacities may reflect atelectasis or mild pneumonia, somewhat more prominent than on the prior study. There is no evidence of pleural effusion or pneumothorax. The heart is borderline normal in size. The patient is status post median sternotomy. No acute osseous abnormalities are seen. IMPRESSION: Mild bibasilar airspace opacities may reflect atelectasis  or mild pneumonia, somewhat more prominent than on the prior study. Electronically Signed   By: Roanna Raider M.D.   On: 03/08/2018 02:26  Dg Chest 2 View  Result Date: 03/06/2018 CLINICAL DATA:  Cough, congestion shortness of breath for 4 weeks. EXAM: CHEST - 2 VIEW COMPARISON:  Single-view of the chest 03/03/2018. PA and lateral chest 02/09/2018. CT chest 03/01/2018. FINDINGS: Linear opacity in the left lung base has an appearance most suggestive of atelectasis but could represent pneumonia. There is a trace left pleural effusion. Cardiomegaly without edema is seen. The patient is status post CABG. Aortic atherosclerosis noted. No acute bony abnormality. IMPRESSION: Linear opacity left lung base has an appearance most suggestive of atelectasis but could represent pneumonia. Trace left pleural effusion. Cardiomegaly without edema. Electronically Signed   By: Drusilla Kanner M.D.   On: 03/06/2018 10:59   Dg Chest 2 View  Result Date: 03/01/2018 CLINICAL DATA:  Left arm and shoulder pain and left chest pain beginning this morning. EXAM: CHEST - 2 VIEW COMPARISON:  02/09/2018 FINDINGS: Previous median sternotomy and CABG. Chronic left ventricular prominence. Chronic aortic atherosclerosis and tortuosity. Hiatal hernia again noted. Worsened areas of linear atelectasis in both lower lungs. No effusions. Upper lungs remain clear. No pulmonary edema. IMPRESSION: New/worsened areas of linear atelectasis in both lower lungs. Electronically Signed   By: Paulina Fusi M.D.   On: 03/01/2018 07:58   Ct Angio Chest Pe W And/or Wo Contrast  Result Date: 03/01/2018 CLINICAL DATA:  Chest pain and shortness of breath EXAM: CT ANGIOGRAPHY CHEST WITH CONTRAST TECHNIQUE: Multidetector CT imaging of the chest was performed using the standard protocol during bolus administration of intravenous contrast. Multiplanar CT image reconstructions and MIPs were obtained to evaluate the vascular anatomy. CONTRAST:  80mL ISOVUE-370  IOPAMIDOL (ISOVUE-370) INJECTION 76% COMPARISON:  Plain film from earlier in same day FINDINGS: Cardiovascular: Thoracic aorta demonstrates mild atherosclerotic calcifications. Changes of prior coronary bypass grafting are noted. The pulmonary artery is well visualized with a normal branching pattern. No filling defects to suggest acute pulmonary emboli are identified. Coronary calcifications are seen. No cardiac enlargement is noted. Mediastinum/Nodes: The thoracic inlet demonstrates some calcifications within the left lobe of the thyroid. No definitive nodule is seen. Some fluid is noted within the esophagus although no definitive obstructive changes are seen. Large sliding-type hiatal hernia is noted. No hilar or mediastinal adenopathy is identified. Lungs/Pleura: Lungs are well aerated bilaterally. Scattered atelectatic changes are noted similar to that seen on recent chest x-ray. There is a focal 6 mm noncalcified nodule identified in the right upper lobe best seen on image number 22 of series 6. No other significant nodular changes are noted. Upper Abdomen: Changes of prior cholecystectomy. Bilateral renal cysts are seen. Musculoskeletal: Degenerative changes of the thoracic spine are noted. Changes consistent with prior median sternotomy are noted. There are multiple sternal and manubrial fractures identified of uncertain chronicity. Review of the MIP images confirms the above findings. IMPRESSION: No evidence of pulmonary emboli. 6 mm right upper lobe nodule. Non-contrast chest CT at 6-12 months is recommended. If the nodule is stable at time of repeat CT, then future CT at 18-24 months (from today's scan) is considered optional for low-risk patients, but is recommended for high-risk patients. This recommendation follows the consensus statement: Guidelines for Management of Incidental Pulmonary Nodules Detected on CT Images: From the Fleischner Society 2017; Radiology 2017; 284:228-243. Changes consistent  with sternal fractures of uncertain chronicity. Correlation to point tenderness is recommended. Aortic Atherosclerosis (ICD10-I70.0). Electronically Signed   By: Alcide Clever M.D.   On: 03/01/2018 12:23     I have independently reviewed the  above radiology studies  and reviewed the findings with the patient.   Recent Lab Findings: Lab Results  Component Value Date   WBC 5.6 06/07/2018   HGB 11.3 (L) 06/07/2018   HCT 35.2 (L) 06/07/2018   PLT 249.0 06/07/2018   GLUCOSE 144 (H) 08/26/2018   CHOL 100 08/26/2018   TRIG 93.0 08/26/2018   HDL 47.90 08/26/2018   LDLCALC 34 08/26/2018   ALT 16 08/26/2018   AST 17 08/26/2018   NA 142 08/26/2018   K 3.7 08/26/2018   CL 105 08/26/2018   CREATININE 1.36 (H) 08/26/2018   BUN 42 (H) 08/26/2018   CO2 27 08/26/2018   HGBA1C 7.0 (H) 08/26/2018   ECHO: Transthoracic Echocardiography  Patient:    Allycia, Pitz MR #:       [id number] Study Date: 03/02/2018 Gender:     F Age:        58 Height:     157.5 cm Weight:     84.8 kg BSA:        1.96 m^2 Pt. Status: Room:       6C05C   ADMITTING    Rollene Rotunda, MD  ATTENDING    Rollene Rotunda, MD  SONOGRAPHER  Roosvelt Maser, RDCS  PERFORMING   [facility], Inpatient  ORDERING     Bhagat, Bhavinkumar  REFERRING    Bhagat, Bhavinkumar  cc:  ------------------------------------------------------------------- LV EF: 40% -   45%  ------------------------------------------------------------------- Indications:      Chest pain 786.51.  ------------------------------------------------------------------- History:   PMH:   Coronary artery disease.  Risk factors: Hypertension.  ------------------------------------------------------------------- Study Conclusions  - Left ventricle: The cavity size was normal. There was mild   concentric hypertrophy. Systolic function was mildly to   moderately reduced. The estimated ejection fraction was in the   range of 40% to 45%. Mild diffuse  hypokinesis. There was an   increased relative contribution of atrial contraction to   ventricular filling. Doppler parameters are consistent with   abnormal left ventricular relaxation (grade 1 diastolic   dysfunction). - Mitral valve: Calcified annulus. There was mild regurgitation. - Left atrium: The atrium was moderately to severely dilated. - Right atrium: The atrium was moderately dilated.  ------------------------------------------------------------------- Labs, prior tests, procedures, and surgery: Catheterization (current admission).  Coronary artery bypass grafting.  ------------------------------------------------------------------- Study data:  No prior study was available for comparison.  Study status:  Routine.  Procedure:  The patient reported no pain pre or post test. Transthoracic echocardiography. Image quality was adequate.  Study completion:  There were no complications. Transthoracic echocardiography.  M-mode, complete 2D, spectral Doppler, and color Doppler.  Birthdate:  Patient birthdate: August 19, 1941.  Age:  Patient is 78 yr old.  Sex:  Gender: female. BMI: 34.2 kg/m^2.  Blood pressure:     127/54  Patient status: Inpatient.  Study date:  Study date: 03/02/2018. Study time: 03:56 PM.  Location:  Bedside.  -------------------------------------------------------------------  ------------------------------------------------------------------- Left ventricle:  The cavity size was normal. There was mild concentric hypertrophy. Systolic function was mildly to moderately reduced. The estimated ejection fraction was in the range of 40% to 45%.  Mild diffuse hypokinesis. There was an increased relative contribution of atrial contraction to ventricular filling. Doppler parameters are consistent with abnormal left ventricular relaxation (grade 1 diastolic dysfunction).  ------------------------------------------------------------------- Aortic valve:    Trileaflet; normal thickness leaflets. Mobility was not restricted.  Doppler:  Transvalvular velocity was within the normal range. There was no stenosis. There was no regurgitation.   ------------------------------------------------------------------- Aorta:  Aortic root: The aortic root was normal in size.  ------------------------------------------------------------------- Mitral valve:   Calcified annulus. Mobility was not restricted. Doppler:  Transvalvular velocity was within the normal range. There was no evidence for stenosis. There was mild regurgitation.  ------------------------------------------------------------------- Left atrium:  The atrium was moderately to severely dilated.   ------------------------------------------------------------------- Right ventricle:  The cavity size was normal. Wall thickness was normal. Systolic function was normal.  ------------------------------------------------------------------- Pulmonic valve:    Structurally normal valve.   Cusp separation was normal.  Doppler:  Transvalvular velocity was within the normal range. There was no evidence for stenosis. There was no regurgitation.  ------------------------------------------------------------------- Tricuspid valve:   Structurally normal valve.    Doppler: Transvalvular velocity was within the normal range. There was no regurgitation.  ------------------------------------------------------------------- Pulmonary artery:   The main pulmonary artery was normal-sized. Systolic pressure could not be accurately estimated.  ------------------------------------------------------------------- Right atrium:  The atrium was moderately dilated.  ------------------------------------------------------------------- Pericardium:  There was no pericardial effusion.  ------------------------------------------------------------------- Systemic veins: Inferior vena cava: The vessel was  normal in size. The respirophasic diameter changes were in the normal range (>= 50%), consistent with normal central venous pressure.  ------------------------------------------------------------------- Measurements   Left ventricle                           Value        Reference  LV ID, ED, PLAX chordal                  48.1  mm     43 - 52  LV ID, ES, PLAX chordal                  37.4  mm     23 - 38  LV fx shortening, PLAX chordal   (L)     22    %      >=29  LV PW thickness, ED                      13.2  mm     ----------  IVS/LV PW ratio, ED                      0.84         <=1.3  Stroke volume, 2D                        73    ml     ----------  Stroke volume/bsa, 2D                    37    ml/m^2 ----------  LV ejection fraction, 1-p A4C            45    %      ----------  LV end-diastolic volume, 2-p             166   ml     ----------  LV end-systolic volume, 2-p              87    ml     ----------  LV ejection fraction, 2-p                48    %      ----------  Stroke volume, 2-p  79    ml     ----------  LV end-diastolic volume/bsa, 2-p         85    ml/m^2 ----------  LV end-systolic volume/bsa, 2-p          44    ml/m^2 ----------  Stroke volume/bsa, 2-p                   40.1  ml/m^2 ----------  LV e&', lateral                           7.29  cm/s   ----------  LV E/e&', lateral                         7.12         ----------  LV e&', medial                            3.48  cm/s   ----------  LV E/e&', medial                          14.91        ----------  LV e&', average                           5.39  cm/s   ----------  LV E/e&', average                         9.64         ----------    Ventricular septum                       Value        Reference  IVS thickness, ED                        11.1  mm     ----------    LVOT                                     Value        Reference  LVOT ID, S                               22    mm      ----------  LVOT area                                3.8   cm^2   ----------  LVOT peak velocity, S                    88.7  cm/s   ----------  LVOT mean velocity, S                    65    cm/s   ----------  LVOT VTI, S                              19.2  cm     ----------    Aorta                                    Value        Reference  Aortic root ID, ED                       34    mm     ----------    Left atrium                              Value        Reference  LA ID, A-P, ES                           45    mm     ----------  LA ID/bsa, A-P                   (H)     2.29  cm/m^2 <=2.2  LA volume, S                             94.1  ml     ----------  LA volume/bsa, S                         47.9  ml/m^2 ----------  LA volume, ES, 1-p A4C                   84.6  ml     ----------  LA volume/bsa, ES, 1-p A4C               43.1  ml/m^2 ----------  LA volume, ES, 1-p A2C                   94.8  ml     ----------  LA volume/bsa, ES, 1-p A2C               48.3  ml/m^2 ----------    Mitral valve                             Value        Reference  Mitral E-wave peak velocity              51.9  cm/s   ----------  Mitral A-wave peak velocity              84.4  cm/s   ----------  Mitral deceleration time         (H)     250   ms     150 - 230  Mitral E/A ratio, peak                   0.6          ----------    Right atrium                             Value        Reference  RA ID, S-I, ES, A4C              (  H)     49.1  mm     34 - 49  RA area, ES, A4C                 (H)     20.5  cm^2   8.3 - 19.5  RA volume, ES, A/L                       64.2  ml     ----------  RA volume/bsa, ES, A/L                   32.7  ml/m^2 ----------    Systemic veins                           Value        Reference  Estimated CVP                            3     mm Hg  ----------    Right ventricle                          Value        Reference  RV ID, minor axis, ED, A4C base          38.3  mm      ----------  TAPSE                                    15.2  mm     ----------  RV s&', lateral, S                        7.94  cm/s   ----------  Legend: (L)  and  (H)  mark values outside specified reference range.  ------------------------------------------------------------------- Prepared and Electronically Authenticated by  Armanda Magic, MD 2019-04-16T17:49:56  POST OP CATH/ STENT/DES : Procedures   CORONARY STENT INTERVENTION  LEFT HEART CATH AND CORS/GRAFTS ANGIOGRAPHY  Conclusion     Prox LAD lesion is 100% stenosed.  Ost 1st Diag lesion is 95% stenosed.  Ost Ramus to Ramus lesion is 30% stenosed.  LIMA graft was visualized by angiography and is normal in caliber.  Origin lesion is 100% stenosed.  SVG graft was visualized by angiography and is normal in caliber.  Mid LM to Dist LM lesion is 40% stenosed.  Ost RPDA lesion is 99% stenosed.  Prox RCA to Mid RCA lesion is 40% stenosed.  A drug-eluting stent was successfully placed using a STENT SIERRA 2.25 X 15 MM.  Post intervention, there is a 0% residual stenosis.   1. 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 The LAD is occluded proximally just beyond the takeoff of the early Diagonal branch. The entire LAD fills from the patent LIMA graft. The Diagonal branch (intermediate branch) has a 95% proximal stenosis. The vein graft that presumably went to this branch is occluded.  3. Successful PTCA/DES x 1 Diagonal (intermediate branch).  4. The Circumflex and another intermediate branch are small to moderate in caliber and have minor plaque.  5. The RCA is a large dominant vessel. There is diffuse 40% stenosis in the proximal/mid and distal RCA. The PDA is small  to moderate in caliber. The ostium of the PDA has a 99% stenosis. The PDA fills from the patent vein graft.   Recommendations: Continue DAPT with ASA and Plavix for at least one year. Continue statin and beta blocker. Echo  to assess LV function.     I have independently reviewed the above  cath films and reviewed the findings with the  patient .      Dg Chest 1 View  Result Date: 03/03/2018 CLINICAL DATA:  Post cath, cough. EXAM: CHEST  1 VIEW COMPARISON:  Chest radiograph 03/01/2018. FINDINGS: Cardiomegaly with calcified tortuous aorta. Median sternotomy and CABG. Linear atelectasis in both lung zones, stable. No edema or consolidation. IMPRESSION: Stable chest.  No consolidation or edema. Electronically Signed   By: Elsie Stain M.D.   On: 03/03/2018 12:24   Dg Chest 2 View  Result Date: 03/08/2018 CLINICAL DATA:  Acute onset of cough and difficulty breathing. EXAM: CHEST - 2 VIEW COMPARISON:  Chest radiograph performed 03/06/2018 FINDINGS: The lungs are well-aerated. Mild bibasilar airspace opacities may reflect atelectasis or mild pneumonia, somewhat more prominent than on the prior study. There is no evidence of pleural effusion or pneumothorax. The heart is borderline normal in size. The patient is status post median sternotomy. No acute osseous abnormalities are seen. IMPRESSION: Mild bibasilar airspace opacities may reflect atelectasis or mild pneumonia, somewhat more prominent than on the prior study. Electronically Signed   By: Roanna Raider M.D.   On: 03/08/2018 02:26   Dg Chest 2 View  Result Date: 03/06/2018 CLINICAL DATA:  Cough, congestion shortness of breath for 4 weeks. EXAM: CHEST - 2 VIEW COMPARISON:  Single-view of the chest 03/03/2018. PA and lateral chest 02/09/2018. CT chest 03/01/2018. FINDINGS: Linear opacity in the left lung base has an appearance most suggestive of atelectasis but could represent pneumonia. There is a trace left pleural effusion. Cardiomegaly without edema is seen. The patient is status post CABG. Aortic atherosclerosis noted. No acute bony abnormality. IMPRESSION: Linear opacity left lung base has an appearance most suggestive of atelectasis but could represent  pneumonia. Trace left pleural effusion. Cardiomegaly without edema. Electronically Signed   By: Drusilla Kanner M.D.   On: 03/06/2018 10:59   Dg Chest 2 View  Result Date: 03/01/2018 CLINICAL DATA:  Left arm and shoulder pain and left chest pain beginning this morning. EXAM: CHEST - 2 VIEW COMPARISON:  02/09/2018 FINDINGS: Previous median sternotomy and CABG. Chronic left ventricular prominence. Chronic aortic atherosclerosis and tortuosity. Hiatal hernia again noted. Worsened areas of linear atelectasis in both lower lungs. No effusions. Upper lungs remain clear. No pulmonary edema. IMPRESSION: New/worsened areas of linear atelectasis in both lower lungs. Electronically Signed   By: Paulina Fusi M.D.   On: 03/01/2018 07:58   Ct Angio Chest Pe W And/or Wo Contrast  Result Date: 03/01/2018 CLINICAL DATA:  Chest pain and shortness of breath EXAM: CT ANGIOGRAPHY CHEST WITH CONTRAST TECHNIQUE: Multidetector CT imaging of the chest was performed using the standard protocol during bolus administration of intravenous contrast. Multiplanar CT image reconstructions and MIPs were obtained to evaluate the vascular anatomy. CONTRAST:  80mL ISOVUE-370 IOPAMIDOL (ISOVUE-370) INJECTION 76% COMPARISON:  Plain film from earlier in same day FINDINGS: Cardiovascular: Thoracic aorta demonstrates mild atherosclerotic calcifications. Changes of prior coronary bypass grafting are noted. The pulmonary artery is well visualized with a normal branching pattern. No filling defects to suggest acute pulmonary emboli are identified. Coronary calcifications are seen. No cardiac enlargement is noted. Mediastinum/Nodes: The  thoracic inlet demonstrates some calcifications within the left lobe of the thyroid. No definitive nodule is seen. Some fluid is noted within the esophagus although no definitive obstructive changes are seen. Large sliding-type hiatal hernia is noted. No hilar or mediastinal adenopathy is identified. Lungs/Pleura: Lungs  are well aerated bilaterally. Scattered atelectatic changes are noted similar to that seen on recent chest x-ray. There is a focal 6 mm noncalcified nodule identified in the right upper lobe best seen on image number 22 of series 6. No other significant nodular changes are noted. Upper Abdomen: Changes of prior cholecystectomy. Bilateral renal cysts are seen. Musculoskeletal: Degenerative changes of the thoracic spine are noted. Changes consistent with prior median sternotomy are noted. There are multiple sternal and manubrial fractures identified of uncertain chronicity. Review of the MIP images confirms the above findings. IMPRESSION: No evidence of pulmonary emboli. 6 mm right upper lobe nodule. Non-contrast chest CT at 6-12 months is recommended. If the nodule is stable at time of repeat CT, then future CT at 18-24 months (from today's scan) is considered optional for low-risk patients, but is recommended for high-risk patients. This recommendation follows the consensus statement: Guidelines for Management of Incidental Pulmonary Nodules Detected on CT Images: From the Fleischner Society 2017; Radiology 2017; 284:228-243. Changes consistent with sternal fractures of uncertain chronicity. Correlation to point tenderness is recommended. Aortic Atherosclerosis (ICD10-I70.0). Electronically Signed   By: Alcide Clever M.D.   On: 03/01/2018 12:23  I have independently reviewed the above radiology studies  and reviewed the findings with the patient.    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  Continue follow-up with cardiology  Delight Ovens MD      [address] [place].[address] [place] [address] Office [phone]   Beeper [phone]  12/23/2018 10:14 AM

## 2018-12-27 NOTE — Progress Notes (Signed)
Cardiology Office Note:    Date:  12/28/2018   ID:  Margaret Ward, DOB 1941/07/02, MRN [id number]  PCP:  Sharlene Dory, DO  Cardiologist:  Norman Herrlich, MD    Referring MD: Sharlene Dory*    ASSESSMENT:    1. Chronic diastolic heart failure (HCC)   2. Carpal tunnel syndrome, bilateral   3. Coronary artery disease of native artery of native heart with stable angina pectoris (HCC)   4. Hypertensive heart disease with heart failure (HCC)   5. Hyperlipidemia, unspecified hyperlipidemia type    PLAN:    In order of problems listed above:  1. Stable compensated New York Heart Association class I continue current diuretic 2. Refer to orthopedic surgery she will need EMG nerve conduction and likely surgical intervention 3. Stable CAD continue current medical treatment including dual antiplatelet therapy 4. Stable blood pressure with beta-blocker and diuretic 5. Stable continue high intensity statin lipids are ideal   Next appointment: 6 months   Medication Adjustments/Labs and Tests Ordered: Current medicines are reviewed at length with the patient today.  Concerns regarding medicines are outlined above.  Orders Placed This Encounter  Procedures  . AMB referral to orthopedics  . EKG 12-Lead   No orders of the defined types were placed in this encounter.   Chief Complaint  Patient presents with  . Coronary Artery Disease  . Congestive Heart Failure    History of Present Illness:    Margaret Ward is a 78 y.o. female with a hx of CAD, CABG in March 2019 and subsequent NonSTEMI 03/01/2018 with PCI and stent  heart failure CKD and acute GI bleed with gastric ulcer.  She required 5 units of PRBC's and her dual antiplatelet therapy was discontinued with recurrent bleeding while hospitalized.  She was last seen 06/25/18. Compliance with diet, lifestyle and medications: Yes  She really has done a good job caring for self she uses a pillbox weighs daily  sodium restricts and from a cardiac perspective she is done well without edema shortness of breath chest pain palpitation or syncope.  Her predominant complaint is typical some symptoms of carpal tunnel with a Tennel sign left wrist and at her request referred referred to orthopedic surgery for consideration of decompression of the median nerve. Past Medical History:  Diagnosis Date  . Anemia   . Arthritis    "hands, arms, all over the place" (03/02/2018)  . CAD in native artery    a. s/p CABG in 01/2018. b. Recurrent CP 02/2018 -> cath showing early graft closure SVG-diagonal, received DES to D1.  . Chronic combined systolic and diastolic CHF (congestive heart failure) (HCC)    a. LVEF 40-45% by echo 02/2018.  Marland Kitchen Chronic lower back pain   . CKD (chronic kidney disease), stage III (HCC)   . GERD (gastroesophageal reflux disease)   . Glass eye    right eye  . Gout    "on daily RX" (03/02/2018)  . History of kidney stones   . Hyperlipidemia   . Hypertension   . Hypertensive heart disease   . Kidney cysts   . Kidney disease   . Left ventricular aneurysm   . Migraine    "used to get them twice/week; haven't had them in a long time" (03/02/2018)  . NSTEMI (non-ST elevated myocardial infarction) (HCC) 03/01/2018  . On home oxygen therapy    "3L at night and prn during the day" (03/02/2018)  . Pneumonia    used to get it  twice/year; stopped after I had hysterectomy" (03/02/2018)  . Sleep apnea   . Type II diabetes mellitus (HCC)     Past Surgical History:  Procedure Laterality Date  . BACK SURGERY    . BIOPSY  05/22/2018   Procedure: BIOPSY;  Surgeon: Kathi Der, MD;  Location: [facility];  Service: Gastroenterology;;  . CARDIAC CATHETERIZATION  12/2017   "before [facility]"  . CHOLECYSTECTOMY OPEN  1982  . CORONARY ARTERY BYPASS GRAFT  01/19/2018   "CABG X3; in [place]"  . CORONARY STENT INTERVENTION N/A 03/02/2018   Procedure: CORONARY STENT INTERVENTION;  Surgeon: Kathleene Hazel, MD;  Location: [facility];  Service: Cardiovascular;  Laterality: N/A;  . DILATION AND CURETTAGE OF UTERUS    . ESOPHAGOGASTRODUODENOSCOPY (EGD) WITH PROPOFOL N/A 05/22/2018   Procedure: ESOPHAGOGASTRODUODENOSCOPY (EGD) WITH PROPOFOL;  Surgeon: Kathi Der, MD;  Location: [facility];  Service: Gastroenterology;  Laterality: N/A;  . ESOPHAGOGASTRODUODENOSCOPY (EGD) WITH PROPOFOL N/A 05/24/2018   Procedure: ESOPHAGOGASTRODUODENOSCOPY (EGD) WITH PROPOFOL;  Surgeon: Kathi Der, MD;  Location: [facility];  Service: Gastroenterology;  Laterality: N/A;  . 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 KNEE ARTHROSCOPY Right X 4  . LEFT HEART CATH AND CORS/GRAFTS ANGIOGRAPHY N/A 03/02/2018   Procedure: LEFT HEART CATH AND CORS/GRAFTS ANGIOGRAPHY;  Surgeon: Kathleene Hazel, MD;  Location: [facility];  Service: Cardiovascular;  Laterality: N/A;  . LUMBAR DISC SURGERY    . REDUCTION MAMMAPLASTY Bilateral X 2  . SCHLEROTHERAPY  05/24/2018   Procedure: Theresia Majors;  Surgeon: Kathi Der, MD;  Location: [facility];  Service: Gastroenterology;;  . TONSILLECTOMY  1953  . VAGINAL HYSTERECTOMY  1970    Current Medications: Current Meds  Medication Sig  . acetaminophen (TYLENOL) 500 MG tablet Take 500 mg by mouth every 6 (six) hours as needed for headache.  . allopurinol (ZYLOPRIM) 100 MG tablet Take 1 tablet (100 mg total) by mouth daily.  Marland Kitchen atorvastatin (LIPITOR) 40 MG tablet Take 40 mg by mouth every evening.   . clopidogrel (PLAVIX) 75 MG tablet Take 1 tablet (75 mg total) by mouth daily.  . furosemide (LASIX) 40 MG tablet Take 2 tablets (80 mg total) by mouth daily.  . metoprolol tartrate (LOPRESSOR) 25 MG tablet Take 0.5 tablets (12.5 mg total) by mouth 2 (two) times daily.  . nitroGLYCERIN (NITROSTAT) 0.4 MG SL tablet Place 1 tablet (0.4 mg total) under the tongue every 5 (five) minutes x 3 doses as needed for chest pain.  . pantoprazole  (PROTONIX) 40 MG tablet Take 1 tablet (40 mg total) by mouth 2 (two) times daily.     Allergies:   Methylprednisolone; Keflex [cephalexin]; Ketorolac; Morphine and related; Novocain [procaine]; and Septra [sulfamethoxazole-trimethoprim]   Social History   Socioeconomic History  . Marital status: Married    Spouse name: Not on file  . Number of children: Not on file  . Years of education: Not on file  . Highest education level: Not on file  Occupational History  . Not on file  Social Needs  . Financial resource strain: Not on file  . Food insecurity:    Worry: Not on file    Inability: Not on file  . Transportation needs:    Medical: Not on file    Non-medical: Not on file  Tobacco Use  . Smoking status: Former Smoker    Packs/day: 0.12    Years: 3.00    Pack years: 0.36  Types: Cigarettes    Last attempt to quit: 1963    Years since quitting: 57.1  . Smokeless tobacco: Never Used  Substance and Sexual Activity  . Alcohol use: Not Currently    Frequency: Never  . Drug use: Never  . Sexual activity: Not on file  Lifestyle  . Physical activity:    Days per week: Not on file    Minutes per session: Not on file  . Stress: Not on file  Relationships  . Social connections:    Talks on phone: Not on file    Gets together: Not on file    Attends religious service: Not on file    Active member of club or organization: Not on file    Attends meetings of clubs or organizations: Not on file    Relationship status: Not on file  Other Topics Concern  . Not on file  Social History Narrative  . Not on file     Family History: The patient's family history includes Diabetes in her father; Heart Problems in her mother; Heart disease in her brother; Hypertension in her brother, daughter, daughter, daughter, and daughter; Kidney disease in her brother and sister. ROS:   Please see the history of present illness.    All other systems reviewed and are  negative.  EKGs/Labs/Other Studies Reviewed:    The following studies were reviewed today:  EKG:  EKG ordered today.  The ekg ordered today demonstrates SRTH   Recent Labs: 04/29/2018: NT-Pro BNP 1,843 05/21/2018: B Natriuretic Peptide 68.8 06/07/2018: Hemoglobin 11.3; Platelets 249.0 08/26/2018: ALT 16; BUN 42; Creatinine, Ser 1.36; Potassium 3.7; Sodium 142  Recent Lipid Panel    Component Value Date/Time   CHOL 100 08/26/2018 0920   CHOL 88 (L) 04/29/2018 0905   TRIG 93.0 08/26/2018 0920   HDL 47.90 08/26/2018 0920   HDL 50 04/29/2018 0905   CHOLHDL 2 08/26/2018 0920   VLDL 18.6 08/26/2018 0920   LDLCALC 34 08/26/2018 0920   LDLCALC 23 04/29/2018 0905    Physical Exam:    VS:  BP 122/70 (BP Location: Right Arm, Patient Position: Sitting, Cuff Size: Large)   Pulse 72   Ht 5\' 2"  (1.575 m)   Wt 172 lb 6.4 oz (78.2 kg)   SpO2 96%   BMI 31.53 kg/m     Wt Readings from Last 3 Encounters:  12/28/18 172 lb 6.4 oz (78.2 kg)  12/23/18 178 lb 3.2 oz (80.8 kg)  08/26/18 167 lb 6 oz (75.9 kg)     GEN:  Well nourished, well developed in no acute distress HEENT: Normal NECK: No JVD; No carotid bruits LYMPHATICS: No lymphadenopathy CARDIAC: RRR, no murmurs, rubs, gallops RESPIRATORY:  Clear to auscultation without rales, wheezing or rhonchi  ABDOMEN: Soft, non-tender, non-distended MUSCULOSKELETAL:  No edema; No deformity  SKIN: Warm and dry NEUROLOGIC:  Alert and oriented x 3 PSYCHIATRIC:  Normal affect    Signed, Norman HerrlichBrian Dushaun Okey, MD  12/28/2018 12:14 PM    [facility]

## 2018-12-28 ENCOUNTER — Encounter: Payer: Self-pay | Admitting: Cardiology

## 2018-12-28 ENCOUNTER — Ambulatory Visit (INDEPENDENT_AMBULATORY_CARE_PROVIDER_SITE_OTHER): Payer: Medicare Other | Admitting: Cardiology

## 2018-12-28 VITALS — BP 122/70 | HR 72 | Ht 62.0 in | Wt 172.4 lb

## 2018-12-28 DIAGNOSIS — I5032 Chronic diastolic (congestive) heart failure: Secondary | ICD-10-CM

## 2018-12-28 DIAGNOSIS — G5603 Carpal tunnel syndrome, bilateral upper limbs: Secondary | ICD-10-CM | POA: Insufficient documentation

## 2018-12-28 DIAGNOSIS — I110 Hypertensive heart disease with heart failure: Secondary | ICD-10-CM | POA: Diagnosis not present

## 2018-12-28 DIAGNOSIS — I25118 Atherosclerotic heart disease of native coronary artery with other forms of angina pectoris: Secondary | ICD-10-CM | POA: Diagnosis not present

## 2018-12-28 DIAGNOSIS — E785 Hyperlipidemia, unspecified: Secondary | ICD-10-CM

## 2018-12-28 HISTORY — DX: Carpal tunnel syndrome, bilateral upper limbs: G56.03

## 2018-12-28 NOTE — Patient Instructions (Addendum)
Medication Instructions:  Your physician has recommended you make the following change in your medication:  START mucinex as needed for cough  If you need a refill on your cardiac medications before your next appointment, please call your pharmacy.   Lab work: NONE  Testing/Procedures: You have been referred to [facility] to address your carpal tunnel. You will be contacted to schedule this appointment.   An EKG was perfomed today.  Follow-Up: At [facility], you and your health needs are our priority.  As part of our continuing mission to provide you with exceptional heart care, we have created designated Provider Care Teams.  These Care Teams include your primary Cardiologist (physician) and Advanced Practice Providers (APPs -  Physician Assistants and Nurse Practitioners) who all work together to provide you with the care you need, when you need it. You will need a follow up appointment in 6 months.  Please call our office 2 months in advance to schedule this appointment.  You may see Norman HerrlichBrian Gaven Eugene, MD      Heart Failure  Weigh yourself every morning when you first wake up and record on a calender or note pad, bring this to your office visits. Using a pill tender can help with taking your medications consistently.  Limit your fluid intake to 2 liters daily  Limit your sodium intake to less than 2-3 grams daily. Ask if you need dietary teaching.  If you gain more than 3 pounds (from your dry weight ), double your dose of diuretic for the day.  If you gain more than 5 pounds (from your dry weight), double your dose of lasix and call your heart failure doctor.  Please do not smoke tobacco since it is very bad for your heart.  Please do not drink alcohol since it can worsen your heart failure.Also avoid OTC nonsteroidal drugs, such as advil, aleve and motrin.  Try to exercise for at least 30 minutes every day because this will help your heart be more efficient. You  may be eligible for supervised cardiac rehab, ask your physician.

## 2019-01-04 DIAGNOSIS — R200 Anesthesia of skin: Secondary | ICD-10-CM | POA: Diagnosis not present

## 2019-01-04 DIAGNOSIS — M1811 Unilateral primary osteoarthritis of first carpometacarpal joint, right hand: Secondary | ICD-10-CM | POA: Diagnosis not present

## 2019-01-04 DIAGNOSIS — M1812 Unilateral primary osteoarthritis of first carpometacarpal joint, left hand: Secondary | ICD-10-CM | POA: Diagnosis not present

## 2019-02-02 ENCOUNTER — Other Ambulatory Visit: Payer: Self-pay | Admitting: *Deleted

## 2019-02-02 MED ORDER — ATORVASTATIN CALCIUM 40 MG PO TABS: 40.0000 mg | ORAL_TABLET | Freq: Every evening | ORAL | 1 refills | Status: DC

## 2019-02-23 ENCOUNTER — Other Ambulatory Visit: Payer: Self-pay | Admitting: Family Medicine

## 2019-03-02 ENCOUNTER — Ambulatory Visit: Payer: Medicare Other | Admitting: Family Medicine

## 2019-03-09 ENCOUNTER — Other Ambulatory Visit: Payer: Self-pay | Admitting: Cardiology

## 2019-03-09 MED ORDER — METOPROLOL TARTRATE 25 MG PO TABS: 12.5000 mg | ORAL_TABLET | Freq: Two times a day (BID) | ORAL | 1 refills | Status: DC

## 2019-03-09 NOTE — Telephone Encounter (Signed)
°*  STAT* If patient is at the pharmacy, call can be transferred to refill team.   1. Which medications need to be refilled? (please list name of each medication and dose if known) metoprolol tartrate (LOPRESSOR) 25 MG tablet  2. Which pharmacy/location (including street and city if local pharmacy) is medication to be sent to?  [id number] - [place], [place] - [address] AT [facility] [address] [phone] (Phone) [phone] (Fax)     3. Do they need a 30 day or 90 day supply?90 day

## 2019-03-09 NOTE — Telephone Encounter (Signed)
Metoprolol sent to [facility] in [place]

## 2019-03-16 ENCOUNTER — Other Ambulatory Visit: Payer: Self-pay | Admitting: Family Medicine

## 2019-04-06 ENCOUNTER — Encounter: Payer: Self-pay | Admitting: Family Medicine

## 2019-04-06 ENCOUNTER — Ambulatory Visit (INDEPENDENT_AMBULATORY_CARE_PROVIDER_SITE_OTHER): Payer: Medicare Other | Admitting: Family Medicine

## 2019-04-06 DIAGNOSIS — I5032 Chronic diastolic (congestive) heart failure: Secondary | ICD-10-CM | POA: Diagnosis not present

## 2019-04-06 DIAGNOSIS — M1A9XX0 Chronic gout, unspecified, without tophus (tophi): Secondary | ICD-10-CM | POA: Diagnosis not present

## 2019-04-06 DIAGNOSIS — E1139 Type 2 diabetes mellitus with other diabetic ophthalmic complication: Secondary | ICD-10-CM

## 2019-04-06 DIAGNOSIS — N183 Chronic kidney disease, stage 3 unspecified: Secondary | ICD-10-CM

## 2019-04-06 DIAGNOSIS — K219 Gastro-esophageal reflux disease without esophagitis: Secondary | ICD-10-CM

## 2019-04-06 NOTE — Progress Notes (Signed)
Subjective:   Chief Complaint  Patient presents with  . Follow-up    Margaret Ward is a 78 y.o. female here for follow-up of diabetes. Due to COVID-19 pandemic, we are interacting via web portal for an electronic face-to-face visit. I verified patient's ID using 2 identifiers. Patient agreed to proceed with visit via this method. Patient is at home, I am at office. Patient, her daughter, her husband, and I are present for visit.   Annissa's self monitored glucose range is low 100's.  Patient denies hypoglycemic reactions. She checks her glucose levels 2-3 times per week. Patient does not require insulin.   Medications include: Diet controlled Exercise: None Diet is fair  Hx of gout, on allopurinol 100 mg/d. No recent flares. Tolerating medicine, no AE's, reports compliance. Needs urate checked.  Hx of GERD. Protonix 40 mg/d, takes daily. No AE's, no s/s's.   Past Medical History:  Diagnosis Date  . Anemia   . Arthritis    "hands, arms, all over the place" (03/02/2018)  . CAD in native artery    a. s/p CABG in 01/2018. b. Recurrent CP 02/2018 -> cath showing early graft closure SVG-diagonal, received DES to D1.  . Chronic combined systolic and diastolic CHF (congestive heart failure) (HCC)    a. LVEF 40-45% by echo 02/2018.  Marland Kitchen Chronic lower back pain   . CKD (chronic kidney disease), stage III (HCC)   . GERD (gastroesophageal reflux disease)   . Glass eye    right eye  . Gout    "on daily RX" (03/02/2018)  . History of kidney stones   . Hyperlipidemia   . Hypertension   . Hypertensive heart disease   . Kidney cysts   . Kidney disease   . Left ventricular aneurysm   . Migraine    "used to get them twice/week; haven't had them in a long time" (03/02/2018)  . NSTEMI (non-ST elevated myocardial infarction) (HCC) 03/01/2018  . On home oxygen therapy    "3L at night and prn during the day" (03/02/2018)  . Pneumonia    used to get it twice/year; stopped after I had  hysterectomy" (03/02/2018)  . Sleep apnea   . Type II diabetes mellitus (HCC)      Related testing: Date of retinal exam: Due, appt was cancelled 2/2 pandemic Pneumovax: done Flu Shot: done  Review of Systems: Pulmonary:  No SOB Cardiovascular:  No chest pain  Objective:  No conversational dyspnea Age appropriate judgment and insight Nml affect and mood  Assessment:   Type 2 diabetes mellitus with other ophthalmic complication, without long-term current use of insulin (HCC) - Plan: Hemoglobin A1c, Microalbumin / creatinine urine ratio, Comprehensive metabolic panel  Gastroesophageal reflux disease, esophagitis presence not specified  Chronic gout without tophus, unspecified cause, unspecified site - Plan: Uric acid  CKD (chronic kidney disease), stage III (HCC)  Chronic diastolic heart failure (HCC)   Plan:   Orders as above. Counseled on diet and exercise. Ck labs. Cont meds.  Doing pretty well. Get eye exam when able.  F/u in 6 mo. The patient voiced understanding and agreement to the plan.  Jilda Roche Brundidge, DO 04/06/19 10:16 AM

## 2019-04-13 ENCOUNTER — Other Ambulatory Visit: Payer: Self-pay | Admitting: Cardiology

## 2019-04-13 DIAGNOSIS — I110 Hypertensive heart disease with heart failure: Secondary | ICD-10-CM

## 2019-04-13 DIAGNOSIS — I5032 Chronic diastolic (congestive) heart failure: Secondary | ICD-10-CM

## 2019-04-14 ENCOUNTER — Telehealth: Payer: Self-pay | Admitting: Family Medicine

## 2019-04-14 NOTE — Telephone Encounter (Signed)
Copied from CRM [id number]. Topic: Appointment Scheduling - Scheduling Inquiry for Clinic >> Apr 14, 2019 11:01 AM Lynne Logan D wrote: Reason for CRM: Pt stated she has a lab appt tomorrow 04/15/19. She has not been out of her house for 2 months and wants to know if this lab appt is necessary or if Dr. Carmelia Roller thinks it can be pushed out. Please advise.

## 2019-04-14 NOTE — Telephone Encounter (Signed)
Called and rescheduled for 05/17/2019

## 2019-04-14 NOTE — Telephone Encounter (Signed)
OK to push out 2-4 weeks. Ty.

## 2019-04-15 ENCOUNTER — Other Ambulatory Visit: Payer: Medicare Other

## 2019-04-26 DIAGNOSIS — K3189 Other diseases of stomach and duodenum: Secondary | ICD-10-CM | POA: Diagnosis not present

## 2019-04-26 DIAGNOSIS — Z8679 Personal history of other diseases of the circulatory system: Secondary | ICD-10-CM | POA: Diagnosis not present

## 2019-04-26 DIAGNOSIS — K259 Gastric ulcer, unspecified as acute or chronic, without hemorrhage or perforation: Secondary | ICD-10-CM | POA: Diagnosis not present

## 2019-04-26 DIAGNOSIS — Z9049 Acquired absence of other specified parts of digestive tract: Secondary | ICD-10-CM | POA: Diagnosis not present

## 2019-04-26 DIAGNOSIS — K449 Diaphragmatic hernia without obstruction or gangrene: Secondary | ICD-10-CM | POA: Diagnosis not present

## 2019-04-26 DIAGNOSIS — Z862 Personal history of diseases of the blood and blood-forming organs and certain disorders involving the immune mechanism: Secondary | ICD-10-CM | POA: Diagnosis not present

## 2019-05-04 ENCOUNTER — Telehealth: Payer: Self-pay | Admitting: Family Medicine

## 2019-05-17 ENCOUNTER — Other Ambulatory Visit: Payer: Medicare Other

## 2019-05-25 MED ORDER — POTASSIUM CHLORIDE ER 10 MEQ PO TBCR: EXTENDED_RELEASE_TABLET | ORAL | 0 refills | Status: DC

## 2019-05-25 NOTE — Addendum Note (Signed)
Addended by: Sharon Seller B on: 05/25/2019 02:21 PM   Modules accepted: Orders

## 2019-05-25 NOTE — Telephone Encounter (Signed)
Patient stated she picked up her potassium chloride (K-DUR) 10 MEQ tablet prescription but there was only 60 tablets.  She stated she is suppose to take 2 tablets a day, but on M, W, F, she is suppose to take an extra one.  Also she is suppose to receive a 90day supply. She would like for the prescription to be corrected and sent to the [facility] on [address].

## 2019-05-25 NOTE — Telephone Encounter (Signed)
refilled 

## 2019-05-30 ENCOUNTER — Emergency Department (HOSPITAL_BASED_OUTPATIENT_CLINIC_OR_DEPARTMENT_OTHER): Payer: Medicare Other

## 2019-05-30 ENCOUNTER — Emergency Department (HOSPITAL_BASED_OUTPATIENT_CLINIC_OR_DEPARTMENT_OTHER)
Admission: EM | Admit: 2019-05-30 | Discharge: 2019-05-31 | Disposition: A | Discharge status: Home / Self Care | Payer: Medicare Other | Attending: Emergency Medicine | Admitting: Emergency Medicine

## 2019-05-30 ENCOUNTER — Other Ambulatory Visit: Payer: Self-pay

## 2019-05-30 ENCOUNTER — Encounter (HOSPITAL_BASED_OUTPATIENT_CLINIC_OR_DEPARTMENT_OTHER): Payer: Self-pay | Admitting: *Deleted

## 2019-05-30 DIAGNOSIS — E119 Type 2 diabetes mellitus without complications: Secondary | ICD-10-CM | POA: Insufficient documentation

## 2019-05-30 DIAGNOSIS — N183 Chronic kidney disease, stage 3 (moderate): Secondary | ICD-10-CM | POA: Insufficient documentation

## 2019-05-30 DIAGNOSIS — R1011 Right upper quadrant pain: Secondary | ICD-10-CM | POA: Diagnosis not present

## 2019-05-30 DIAGNOSIS — Z951 Presence of aortocoronary bypass graft: Secondary | ICD-10-CM | POA: Diagnosis not present

## 2019-05-30 DIAGNOSIS — N3000 Acute cystitis without hematuria: Secondary | ICD-10-CM

## 2019-05-30 DIAGNOSIS — Z7901 Long term (current) use of anticoagulants: Secondary | ICD-10-CM | POA: Insufficient documentation

## 2019-05-30 DIAGNOSIS — Z79899 Other long term (current) drug therapy: Secondary | ICD-10-CM | POA: Diagnosis not present

## 2019-05-30 DIAGNOSIS — N281 Cyst of kidney, acquired: Secondary | ICD-10-CM | POA: Diagnosis not present

## 2019-05-30 DIAGNOSIS — Z87891 Personal history of nicotine dependence: Secondary | ICD-10-CM | POA: Diagnosis not present

## 2019-05-30 DIAGNOSIS — I5042 Chronic combined systolic (congestive) and diastolic (congestive) heart failure: Secondary | ICD-10-CM | POA: Insufficient documentation

## 2019-05-30 DIAGNOSIS — I130 Hypertensive heart and chronic kidney disease with heart failure and stage 1 through stage 4 chronic kidney disease, or unspecified chronic kidney disease: Secondary | ICD-10-CM | POA: Insufficient documentation

## 2019-05-30 DIAGNOSIS — N200 Calculus of kidney: Secondary | ICD-10-CM | POA: Diagnosis not present

## 2019-05-30 LAB — CBC
HCT: 40.5 % (ref 36.0–46.0)
Hemoglobin: 12.8 g/dL (ref 12.0–15.0)
MCH: 31.1 pg (ref 26.0–34.0)
MCHC: 31.6 g/dL (ref 30.0–36.0)
MCV: 98.3 fL (ref 80.0–100.0)
Platelets: 161 10*3/uL (ref 150–400)
RBC: 4.12 MIL/uL (ref 3.87–5.11)
RDW: 13.9 % (ref 11.5–15.5)
WBC: 6.1 10*3/uL (ref 4.0–10.5)
nRBC: 0 % (ref 0.0–0.2)

## 2019-05-30 LAB — URINALYSIS, ROUTINE W REFLEX MICROSCOPIC
Bilirubin Urine: NEGATIVE
Glucose, UA: NEGATIVE mg/dL
Ketones, ur: NEGATIVE mg/dL
Nitrite: NEGATIVE
Protein, ur: NEGATIVE mg/dL
Specific Gravity, Urine: 1.01 (ref 1.005–1.030)
pH: 5.5 (ref 5.0–8.0)

## 2019-05-30 LAB — URINALYSIS, MICROSCOPIC (REFLEX): WBC, UA: >50 WBC/hpf (ref 0–5)

## 2019-05-30 LAB — COMPREHENSIVE METABOLIC PANEL
ALT: 19 U/L (ref 0–44)
AST: 19 U/L (ref 15–41)
Albumin: 3.8 g/dL (ref 3.5–5.0)
Alkaline Phosphatase: 97 U/L (ref 38–126)
Anion gap: 13 (ref 5–15)
BUN: 31 mg/dL — ABNORMAL HIGH (ref 8–23)
CO2: 23 mmol/L (ref 22–32)
Calcium: 8.5 mg/dL — ABNORMAL LOW (ref 8.9–10.3)
Chloride: 103 mmol/L (ref 98–111)
Creatinine, Ser: 1.21 mg/dL — ABNORMAL HIGH (ref 0.44–1.00)
GFR calc Af Amer: 50 mL/min — ABNORMAL LOW (ref >60)
GFR calc non Af Amer: 43 mL/min — ABNORMAL LOW (ref >60)
Glucose, Bld: 276 mg/dL — ABNORMAL HIGH (ref 70–99)
Potassium: 3.8 mmol/L (ref 3.5–5.1)
Sodium: 139 mmol/L (ref 135–145)
Total Bilirubin: 0.4 mg/dL (ref 0.3–1.2)
Total Protein: 6.5 g/dL (ref 6.5–8.1)

## 2019-05-30 LAB — LIPASE, BLOOD: Lipase: 49 U/L (ref 11–51)

## 2019-05-30 MED ORDER — CIPROFLOXACIN HCL 500 MG PO TABS: 500.0000 mg | ORAL_TABLET | Freq: Two times a day (BID) | ORAL | 0 refills | Status: DC

## 2019-05-30 MED ORDER — IOHEXOL 300 MG/ML  SOLN: 100.0000 mL | Freq: Once | INTRAMUSCULAR | Status: DC | PRN

## 2019-05-30 MED ORDER — SODIUM CHLORIDE 0.9% FLUSH
3.0000 mL | Freq: Once | INTRAVENOUS | Status: DC
  Filled 2019-05-30: qty 3

## 2019-05-30 MED ORDER — SODIUM CHLORIDE 0.9 % IV SOLN: INTRAVENOUS | Status: DC

## 2019-05-30 NOTE — ED Triage Notes (Signed)
Right upper quadrant pain. Site is large and hard to touch.

## 2019-05-30 NOTE — ED Provider Notes (Addendum)
[facility] EMERGENCY DEPARTMENT Provider Note   CSN: [id number] Arrival date & time: 05/30/19  1849     History   Chief Complaint Chief Complaint  Patient presents with  . Abdominal Pain    HPI Margaret Ward is a 78 y.o. female.     Patient with complaint of right-sided abdominal pain.  States she has a hard large area to touch.  In the right flank right upper quadrant part of the abdomen claims just started this morning.  Patient is also having some dysuria and concern about a urinary tract infection.  She states she has had several in the past.  Denies any fevers or upper respiratory symptoms no nausea vomiting or diarrhea.  No blood in the urine.  Patient's had a past history of hernias on the anterior part of the abdomen.  But is never had anything over here in the right flank area.  Past medical history is significant for hypertension chronic kidney disease stage III.  Combined systolic diastolic congestive heart failure.  Chronic low back pain.  And type 2 diabetes.  Patient status post CABG in 2019.  And patient's had coronary stents.  And patient's had a vaginal hysterectomy.     Past Medical History:  Diagnosis Date  . Anemia   . Arthritis    "hands, arms, all over the place" (03/02/2018)  . CAD in native artery    a. s/p CABG in 01/2018. b. Recurrent CP 02/2018 -> cath showing early graft closure SVG-diagonal, received DES to D1.  . Chronic combined systolic and diastolic CHF (congestive heart failure) (HCC)    a. LVEF 40-45% by echo 02/2018.  Marland Kitchen. Chronic lower back pain   . CKD (chronic kidney disease), stage III (HCC)   . GERD (gastroesophageal reflux disease)   . Glass eye    right eye  . Gout    "on daily RX" (03/02/2018)  . History of kidney stones   . Hyperlipidemia   . Hypertension   . Hypertensive heart disease   . Kidney cysts   . Kidney disease   . Left ventricular aneurysm   . Migraine    "used to get them twice/week; haven't had them in a  long time" (03/02/2018)  . NSTEMI (non-ST elevated myocardial infarction) (HCC) 03/01/2018  . On home oxygen therapy    "3L at night and prn during the day" (03/02/2018)  . Pneumonia    used to get it twice/year; stopped after I had hysterectomy" (03/02/2018)  . Sleep apnea   . Type II diabetes mellitus [facility](HCC)     Patient Active Problem List   Diagnosis Date Noted  . Carpal tunnel syndrome, bilateral 12/28/2018  . 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 Symptomatic anemia 05/21/2018  . Hypertension 05/21/2018  . 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 GERD (gastroesophageal reflux disease) 05/21/2018  . Type II diabetes mellitus (HCC) 05/21/2018  . Chronic combined systolic and diastolic heart failure (HCC) 05/21/2018  . Heme positive stool 05/21/2018  . HLD (hyperlipidemia) 05/21/2018  . Sleep apnea 05/21/2018  . Chronic hypoxemic respiratory failure (HCC) 05/21/2018  . CKD (chronic kidney disease), stage III (HCC) 05/21/2018  . Diabetes mellitus type 2 in obese (HCC) 04/23/2018  . Chronic diastolic heart failure (HCC) 03/28/2018  . Bronchospasm 03/28/2018  . Sinus tachycardia 03/09/2018  . SOB (shortness of breath) 03/09/2018  . 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  . NSTEMI (non-ST elevated myocardial infarction) (  HCC) 03/01/2018  . CAD (coronary artery disease) 02/08/2018  . Hyperlipidemia 02/08/2018  . Chest pain 02/05/2018  . 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 BACK SURGERY    . BIOPSY  05/22/2018   Procedure: BIOPSY;  Surgeon: Kathi DerBrahmbhatt, Parag, MD;  Location: [facility];  Service: Gastroenterology;;  . CARDIAC CATHETERIZATION  12/2017   "before OHS"  . CHOLECYSTECTOMY OPEN  1982  . CORONARY ARTERY BYPASS GRAFT  01/19/2018   [place]  . CORONARY STENT INTERVENTION N/A 03/02/2018   Procedure: CORONARY STENT INTERVENTION;  Surgeon: Kathleene HazelMcAlhany, Christopher D, MD;   Location: [facility];  Service: Cardiovascular;  Laterality: N/A;  . DILATION AND CURETTAGE OF UTERUS    . ESOPHAGOGASTRODUODENOSCOPY (EGD) WITH PROPOFOL N/A 05/22/2018   Procedure: ESOPHAGOGASTRODUODENOSCOPY (EGD) WITH PROPOFOL;  Surgeon: Kathi DerBrahmbhatt, Parag, MD;  Location: [facility];  Service: Gastroenterology;  Laterality: N/A;  . ESOPHAGOGASTRODUODENOSCOPY (EGD) WITH PROPOFOL N/A 05/24/2018   Procedure: ESOPHAGOGASTRODUODENOSCOPY (EGD) WITH PROPOFOL;  Surgeon: Kathi DerBrahmbhatt, Parag, MD;  Location: [facility];  Service: Gastroenterology;  Laterality: N/A;  . 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 KNEE ARTHROSCOPY Right X 4  . LEFT HEART CATH AND CORS/GRAFTS ANGIOGRAPHY N/A 03/02/2018   Procedure: LEFT HEART CATH AND CORS/GRAFTS ANGIOGRAPHY;  Surgeon: Kathleene HazelMcAlhany, Christopher D, MD;  Location: [facility];  Service: Cardiovascular;  Laterality: N/A;  . LUMBAR DISC SURGERY    . REDUCTION MAMMAPLASTY Bilateral X 2  . SCHLEROTHERAPY  05/24/2018   Procedure: Theresia MajorsSCHLEROTHERAPY;  Surgeon: Kathi DerBrahmbhatt, Parag, MD;  Location: [facility];  Service: Gastroenterology;;  . TONSILLECTOMY  1953  . VAGINAL HYSTERECTOMY  1970     OB History   No obstetric history on file.      Home Medications    Prior to Admission medications   Medication Sig Start Date End Date Taking? Authorizing Provider  acetaminophen (TYLENOL) 500 MG tablet Take 500 mg by mouth every 6 (six) hours as needed for headache.    [provider]  allopurinol (ZYLOPRIM) 100 MG tablet TAKE 1 TABLET(100 MG) BY MOUTH DAILY 03/16/19   Wendling, Jilda RocheNicholas Paul, DO  atorvastatin (LIPITOR) 40 MG tablet Take 1 tablet (40 mg total) by mouth every evening. 02/02/19   Baldo DaubMunley, Brian J, MD  ciprofloxacin (CIPRO) 500 MG tablet Take 1 tablet (500 mg total) by mouth 2 (two) times daily. 05/30/19   Vanetta MuldersZackowski, Nahzir Pohle, MD  clopidogrel (PLAVIX) 75 MG tablet TAKE 1 TABLET(75 MG) BY MOUTH DAILY 02/23/19   Wendling, Jilda RocheNicholas Paul, DO   furosemide (LASIX) 40 MG tablet TAKE 1 TABLET BY MOUTH DAILY. TAKE 1 ADDITIONAL TABLET ON [facility] AND FRIDAY 04/13/19   Baldo DaubMunley, Brian J, MD  metoprolol tartrate (LOPRESSOR) 25 MG tablet Take 0.5 tablets (12.5 mg total) by mouth 2 (two) times daily. 03/09/19   Baldo DaubMunley, Brian J, MD  nitroGLYCERIN (NITROSTAT) 0.4 MG SL tablet Place 1 tablet (0.4 mg total) under the tongue every 5 (five) minutes x 3 doses as needed for chest pain. 03/03/18   [facility], Roe RutherfordAngela Nicole, PA  pantoprazole (PROTONIX) 40 MG tablet Take 1 tablet (40 mg total) by mouth 2 (two) times daily. 05/27/18   Zannie CoveJoseph, Preetha, MD  potassium chloride (K-DUR) 10 MEQ tablet TAKE 2 TABLETS BY MOUTH WITH EVERY DOSE OF FUROSEMIDE 05/25/19   Sharlene DoryWendling, Nicholas Paul, DO  vitamin C (ASCORBIC ACID) 500 MG tablet Take 500 mg by mouth 2 (two) times  daily.    [provider]    Family History Family History  Problem Relation Age of Onset  . Heart Problems Mother   . Diabetes Father   . Kidney disease Sister   . Heart disease Brother   . Hypertension Brother   . Kidney disease Brother   . Hypertension Daughter   . Hypertension Daughter   . Hypertension Daughter   . Hypertension Daughter     Social History Social History   Tobacco Use  . Smoking status: Former Smoker    Packs/day: 0.12    Years: 3.00    Pack years: 0.36    Types: Cigarettes    Quit date: 1963    Years since quitting: 57.5  . Smokeless tobacco: Never Used  Substance Use Topics  . Alcohol use: Not Currently    Frequency: Never  . Drug use: Never     Allergies   Methylprednisolone, Keflex [cephalexin], Ketorolac, Morphine and related, Novocain [procaine], and Septra [sulfamethoxazole-trimethoprim]   Review of Systems Review of Systems  Constitutional: Negative for chills and fever.  HENT: Negative for congestion, rhinorrhea and sore throat.   Eyes: Negative for visual disturbance.  Respiratory: Negative for cough and shortness of breath.    Cardiovascular: Negative for chest pain and leg swelling.  Gastrointestinal: Positive for abdominal distention and abdominal pain. Negative for diarrhea, nausea and vomiting.  Genitourinary: Positive for dysuria.  Musculoskeletal: Negative for back pain and neck pain.  Skin: Negative for rash.  Neurological: Negative for dizziness, light-headedness and headaches.  Hematological: Does not bruise/bleed easily.  Psychiatric/Behavioral: Negative for confusion.     Physical Exam Updated Vital Signs BP 130/74 (BP Location: Right Arm)   Pulse 78   Temp 98.2 F (36.8 C) (Oral)   Resp 16   Ht 1.6 m (5\' 3" )   Wt 78.5 kg   SpO2 97%   BMI 30.65 kg/m   Physical Exam Vitals signs and nursing note reviewed.  Constitutional:      General: She is not in acute distress.    Appearance: Normal appearance. She is well-developed.  HENT:     Head: Normocephalic and atraumatic.  Eyes:     Extraocular Movements: Extraocular movements intact.     Conjunctiva/sclera: Conjunctivae normal.     Pupils: Pupils are equal, round, and reactive to light.  Neck:     Musculoskeletal: Normal range of motion and neck supple.  Cardiovascular:     Rate and Rhythm: Normal rate and regular rhythm.     Heart sounds: No murmur.  Pulmonary:     Effort: Pulmonary effort is normal. No respiratory distress.     Breath sounds: Normal breath sounds.  Abdominal:     General: Bowel sounds are normal.     Palpations: Abdomen is soft. There is mass.     Tenderness: There is no abdominal tenderness.     Comments: Abdomen nontender but there is a large mass-effect somewhat lumpy on the right flank right upper quadrant area of the abdomen.  No skin discoloration not tender no guarding.  Musculoskeletal: Normal range of motion.  Skin:    General: Skin is warm and dry.  Neurological:     General: No focal deficit present.     Mental Status: She is alert and oriented to person, place, and time.      ED Treatments /  Results  Labs (all labs ordered are listed, but only abnormal results are displayed) Labs Reviewed  COMPREHENSIVE METABOLIC PANEL - Abnormal;  Notable for the following components:      Result Value   Glucose, Bld 276 (*)    BUN 31 (*)    Creatinine, Ser 1.21 (*)    Calcium 8.5 (*)    GFR calc non Af Amer 43 (*)    GFR calc Af Amer 50 (*)    All other components within normal limits  URINALYSIS, ROUTINE W REFLEX MICROSCOPIC - Abnormal; Notable for the following components:   APPearance CLOUDY (*)    Hgb urine dipstick TRACE (*)    Leukocytes,Ua LARGE (*)    All other components within normal limits  URINALYSIS, MICROSCOPIC (REFLEX) - Abnormal; Notable for the following components:   Bacteria, UA MANY (*)    All other components within normal limits  URINE CULTURE  LIPASE, BLOOD  CBC    EKG None  Radiology No results found.  Procedures Procedures (including critical care time)  Medications Ordered in ED Medications  sodium chloride flush (NS) 0.9 % injection 3 mL (has no administration in time range)     Initial Impression / Assessment and Plan / ED Course  I have reviewed the triage vital signs and the nursing notes.  Pertinent labs & imaging results that were available during my care of the patient were reviewed by me and considered in my medical decision making (see chart for details).        Patient's urinalysis suggestive of early urinary tract infection.  Patient's had frequent urinary tract infections in the past.  She is allergic to Keflex so we will treat with Cipro.  Urine culture sent.  CT scan of the abdomen to evaluate kind of the mass-effect on the right upper quadrant right flank area of the abdomen.  Which may very well be a hernia but is not particularly tender.  Patient's labs without significant abnormalities.  Patient has abnormal renal function but is not significantly worse.  No leukocytosis.  CT scan was done with oral contrast since they  could not get an IV.  Patient nontoxic no acute distress.  Clearly has a urinary tract infection which will need to be treated with antibiotics.  CT scan had no acute changes.  Suspect that the soft tissue stuff is just soft tissue changes.  Nothing acute inside the abdomen.  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  But the symptoms are all and findings are all on the right.  Nothing to explain them.  We will discharge patient home with treatment for the urinary tract infection.  Final Clinical Impressions(s) / ED Diagnoses   Final diagnoses:  Right upper quadrant abdominal pain  Acute cystitis without hematuria    ED Discharge Orders         Ordered    ciprofloxacin (CIPRO) 500 MG tablet  2 times daily     05/30/19 2341           Vanetta MuldersZackowski, Delfin Squillace, MD 05/30/19 [id number]    Vanetta MuldersZackowski, Teion Ballin, MD 05/30/19 2352

## 2019-05-30 NOTE — Discharge Instructions (Addendum)
Take the Cipro as directed for the urinary tract infection.  Urine culture has been sent.  CT scan without any new findings.  Nothing acute inside the abdomen.  No hernia on the right side.  Follow-up with your primary care doctor for reevaluation.  Return for any new or worse symptoms.

## 2019-05-31 ENCOUNTER — Other Ambulatory Visit: Payer: Medicare Other

## 2019-06-02 ENCOUNTER — Other Ambulatory Visit: Payer: Self-pay | Admitting: Family Medicine

## 2019-06-02 LAB — URINE CULTURE: Culture: >=100000 — AB

## 2019-06-03 ENCOUNTER — Telehealth: Payer: Self-pay | Admitting: Emergency Medicine

## 2019-06-03 NOTE — Telephone Encounter (Signed)
Post ED Visit - Positive Culture Follow-up  Culture report reviewed by antimicrobial stewardship pharmacist: [place] Team []  Elenor Quinones, Pharm.D. []  Heide Guile, Pharm.D., BCPS AQ-ID []  Parks Neptune, Pharm.D., BCPS []  Alycia Rossetti, Pharm.D., BCPS []  Sikes, Pharm.D., BCPS, AAHIVP []  Legrand Como, Pharm.D., BCPS, AAHIVP [x]  Salome Arnt, PharmD, BCPS []  Johnnette Gourd, PharmD, BCPS []  Hughes Better, PharmD, BCPS []  Leeroy Cha, PharmD []  Laqueta Linden, PharmD, BCPS []  Albertina Parr, PharmD  [place] Team []  Leodis Sias, PharmD []  Lindell Spar, PharmD []  Royetta Asal, PharmD []  Graylin Shiver, Rph []  Rema Fendt) Glennon Mac, PharmD []  Arlyn Dunning, PharmD []  Netta Cedars, PharmD []  Dia Sitter, PharmD []  Leone Haven, PharmD []  Gretta Arab, PharmD []  Theodis Shove, PharmD []  Peggyann Juba, PharmD []  Reuel Boom, PharmD   Positive urine culture Treated with Ciprofloxacin, organism sensitive to the same and no further patient follow-up is required at this time.  Sandi Raveling Buckley Bradly 06/03/2019, 9:27 AM

## 2019-06-09 ENCOUNTER — Other Ambulatory Visit (INDEPENDENT_AMBULATORY_CARE_PROVIDER_SITE_OTHER): Payer: Medicare Other

## 2019-06-09 ENCOUNTER — Other Ambulatory Visit: Payer: Self-pay

## 2019-06-09 ENCOUNTER — Telehealth: Payer: Self-pay | Admitting: *Deleted

## 2019-06-09 DIAGNOSIS — M1A9XX0 Chronic gout, unspecified, without tophus (tophi): Secondary | ICD-10-CM

## 2019-06-09 DIAGNOSIS — E1139 Type 2 diabetes mellitus with other diabetic ophthalmic complication: Secondary | ICD-10-CM

## 2019-06-09 LAB — COMPREHENSIVE METABOLIC PANEL
ALT: 16 U/L (ref 0–35)
AST: 17 U/L (ref 0–37)
Albumin: 4 g/dL (ref 3.5–5.2)
Alkaline Phosphatase: 86 U/L (ref 39–117)
BUN: 29 mg/dL — ABNORMAL HIGH (ref 6–23)
CO2: 24 mEq/L (ref 19–32)
Calcium: 9 mg/dL (ref 8.4–10.5)
Chloride: 109 mEq/L (ref 96–112)
Creatinine, Ser: 1.15 mg/dL (ref 0.40–1.20)
GFR: 45.57 mL/min — ABNORMAL LOW (ref >60.00)
Glucose, Bld: 162 mg/dL — ABNORMAL HIGH (ref 70–99)
Potassium: 4.2 mEq/L (ref 3.5–5.1)
Sodium: 142 mEq/L (ref 135–145)
Total Bilirubin: 0.7 mg/dL (ref 0.2–1.2)
Total Protein: 6.4 g/dL (ref 6.0–8.3)

## 2019-06-09 LAB — URIC ACID: Uric Acid, Serum: 6.1 mg/dL (ref 2.4–7.0)

## 2019-06-09 LAB — HEMOGLOBIN A1C: Hgb A1c MFr Bld: 7.9 % — ABNORMAL HIGH (ref 4.6–6.5)

## 2019-06-09 NOTE — Telephone Encounter (Signed)
Noted. We might be able to provide her w specimen cup in future.

## 2019-06-09 NOTE — Addendum Note (Signed)
Addended by: Kelle Darting A on: 06/09/2019 08:52 AM   Modules accepted: Orders

## 2019-06-09 NOTE — Telephone Encounter (Signed)
FYI -- pt was unable to provide urine specimen for microalbumin that was ordered. Test has been cancelled.

## 2019-06-10 ENCOUNTER — Other Ambulatory Visit: Payer: Self-pay | Admitting: Family Medicine

## 2019-06-15 ENCOUNTER — Other Ambulatory Visit: Payer: Self-pay | Admitting: Family Medicine

## 2019-06-15 ENCOUNTER — Telehealth: Payer: Self-pay | Admitting: Family Medicine

## 2019-06-15 DIAGNOSIS — Z951 Presence of aortocoronary bypass graft: Secondary | ICD-10-CM

## 2019-06-15 DIAGNOSIS — J9611 Chronic respiratory failure with hypoxia: Secondary | ICD-10-CM

## 2019-06-15 DIAGNOSIS — I5032 Chronic diastolic (congestive) heart failure: Secondary | ICD-10-CM

## 2019-06-15 NOTE — Progress Notes (Signed)
ce

## 2019-06-15 NOTE — Telephone Encounter (Signed)
Could not send through mychart/printed/PCP signed. She will get her grandson to pickup up at the front desk.

## 2019-06-15 NOTE — Telephone Encounter (Signed)
Pt requesting order for Rolling walker w/seat to be sent in for her. Ok to send order in [place] for them to print and take in. They use [facility] on [address] in [place].

## 2019-06-15 NOTE — Telephone Encounter (Signed)
Copied from [place] [id number]. Topic: General - Other >> Jun 15, 2019  9:27 AM Leward Quan A wrote: Reason for CRM: Patient is requesting a walker with a seat asking Dr Nani Ravens to send orders for her to get one like her husbands. Please call patient at Ph# [phone]   Taken care of already

## 2019-07-15 ENCOUNTER — Other Ambulatory Visit: Payer: Self-pay

## 2019-07-15 MED ORDER — ATORVASTATIN CALCIUM 40 MG PO TABS: 40.0000 mg | ORAL_TABLET | Freq: Every evening | ORAL | 0 refills | Status: DC

## 2019-07-15 NOTE — Telephone Encounter (Signed)
Request for Atorvastatin faxed from [place]. Refill sent in. Patient is over due for an appointment and needs to schedule for further refills.

## 2019-07-27 ENCOUNTER — Telehealth: Payer: Self-pay | Admitting: Family Medicine

## 2019-07-27 MED ORDER — POTASSIUM CHLORIDE ER 10 MEQ PO TBCR: EXTENDED_RELEASE_TABLET | ORAL | 1 refills | Status: DC

## 2019-07-27 NOTE — Telephone Encounter (Signed)
Medication filled to pharmacy as requested.   

## 2019-07-27 NOTE — Telephone Encounter (Signed)
That's fine. Ty.  

## 2019-07-27 NOTE — Telephone Encounter (Signed)
Pt only received 216 tablets of potassium chloride (K-DUR) 10 MEQ tablet She needs 240 tablets per month due to taking them with her Rx for Furosemide/ Pt needs the additional tablets sent or a new Rx she has 4 pills left./ please advise

## 2019-07-27 NOTE — Telephone Encounter (Signed)
Please advise if ok to send in a new rx for increased qty.

## 2019-07-29 IMAGING — DX DG SCAPULA*L*
2 series · 2 of 2 positions shown · non-contrast
Comparison: None.

CLINICAL DATA: Recent open heart surgery with left scapula pain

EXAM:
LEFT SCAPULA - 2+ VIEWS

[scapula ap]
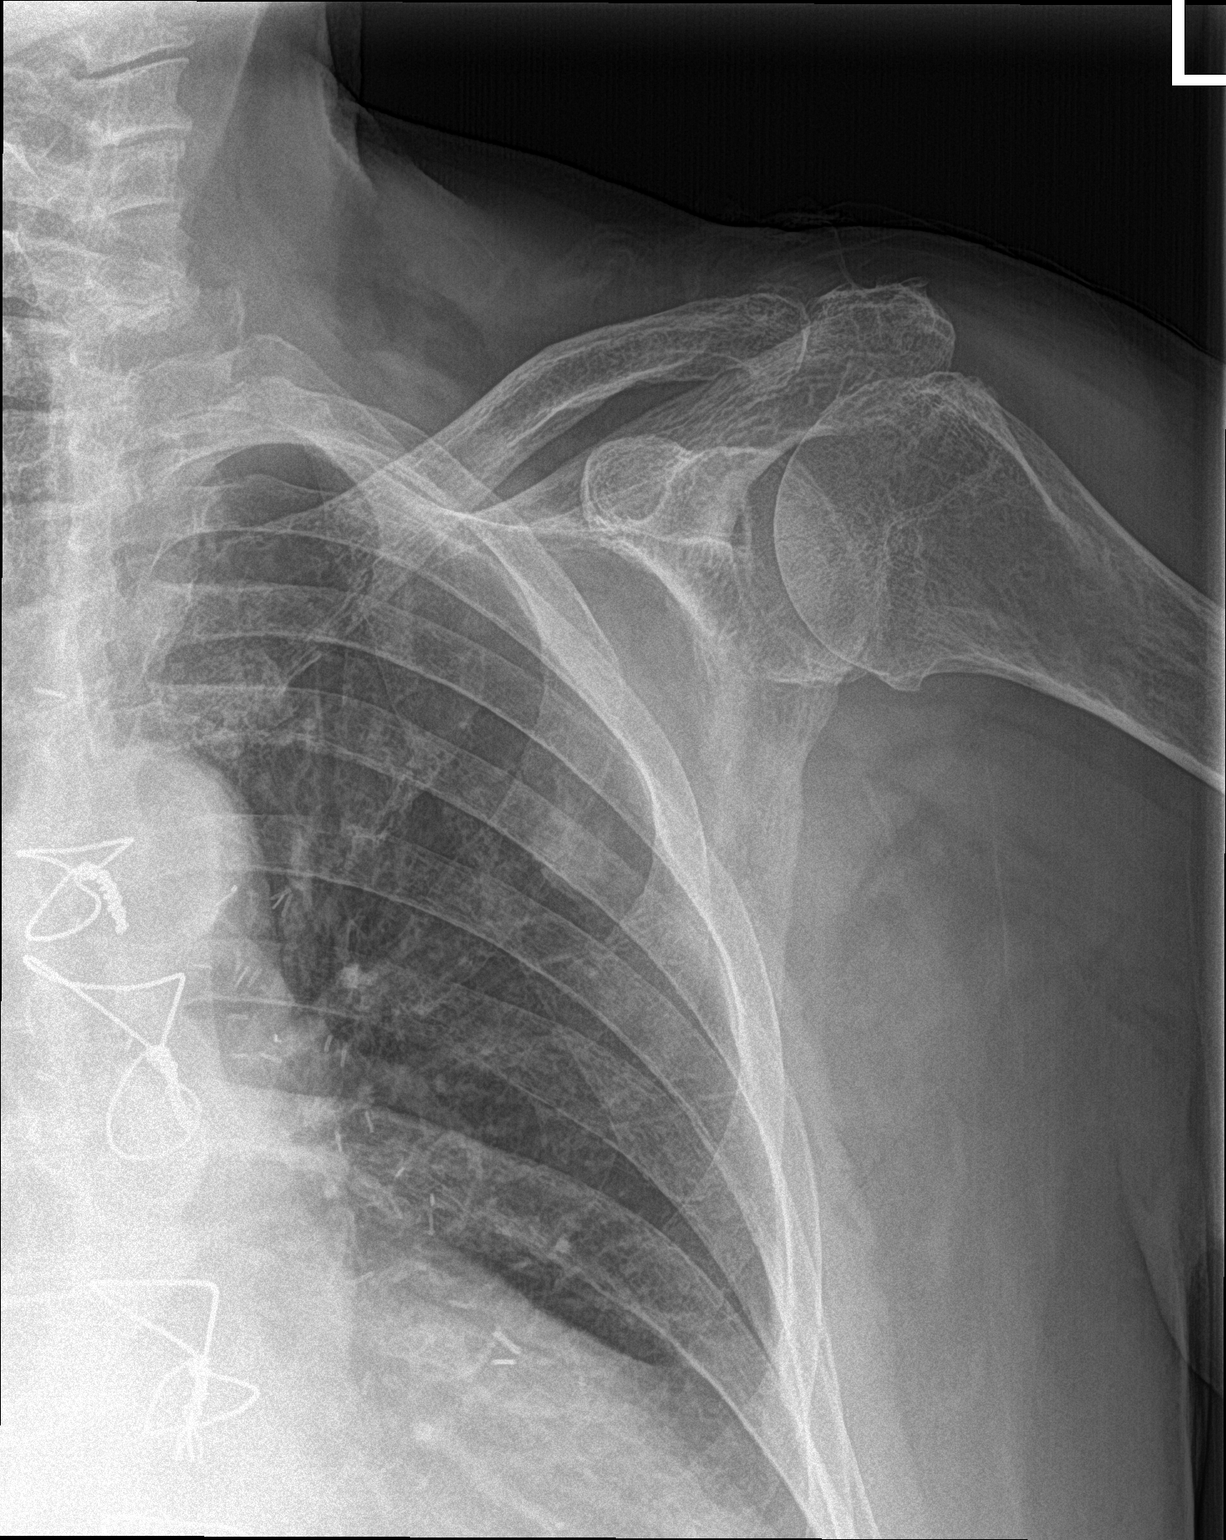

[scapula lat]
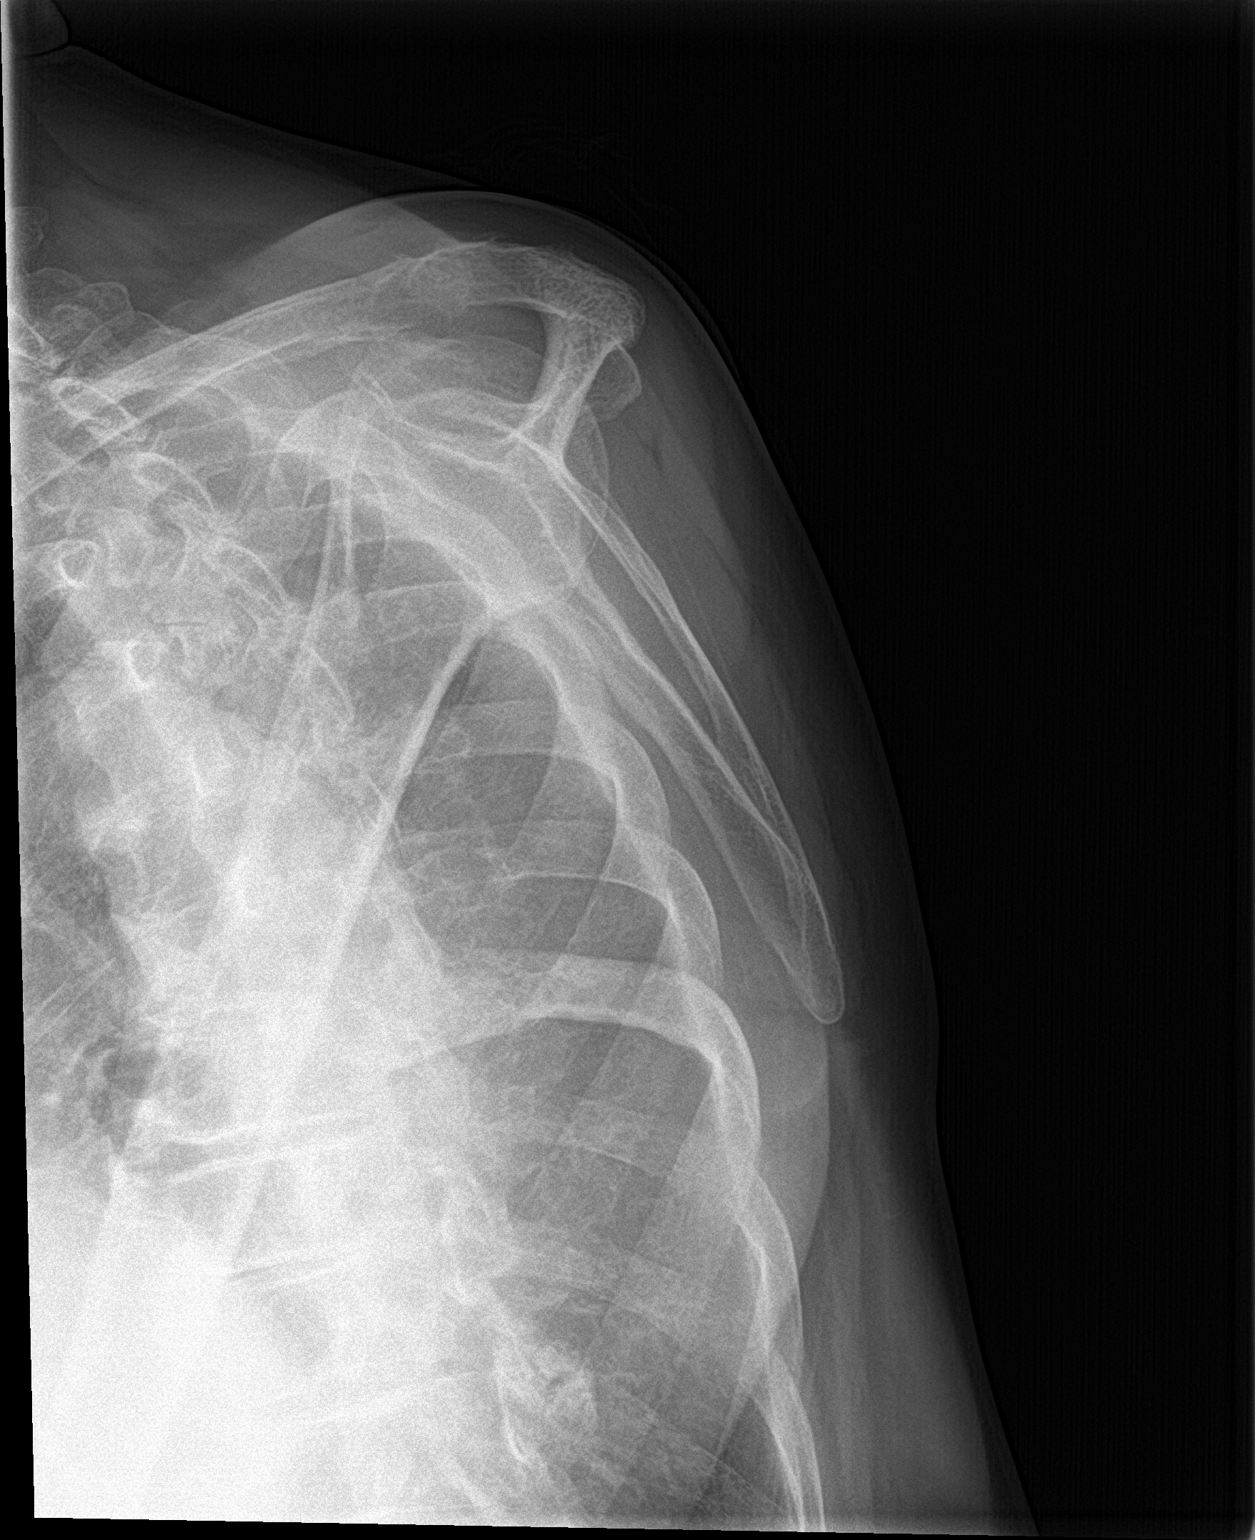

[2 of 2 positions shown; findings below may reference images not displayed]

FINDINGS: Mild AC joint degenerative change. No fracture or malalignment of
the left scapula. Glenohumeral degenerative change.
IMPRESSION: No acute osseous abnormality.

## 2019-07-29 IMAGING — DX DG CHEST 2V
2 series · 2 of 2 positions shown · non-contrast
Comparison: None.

CLINICAL DATA: Recent open heart surgery with left scapula pain

EXAM:
CHEST - 2 VIEW

[chest pa]
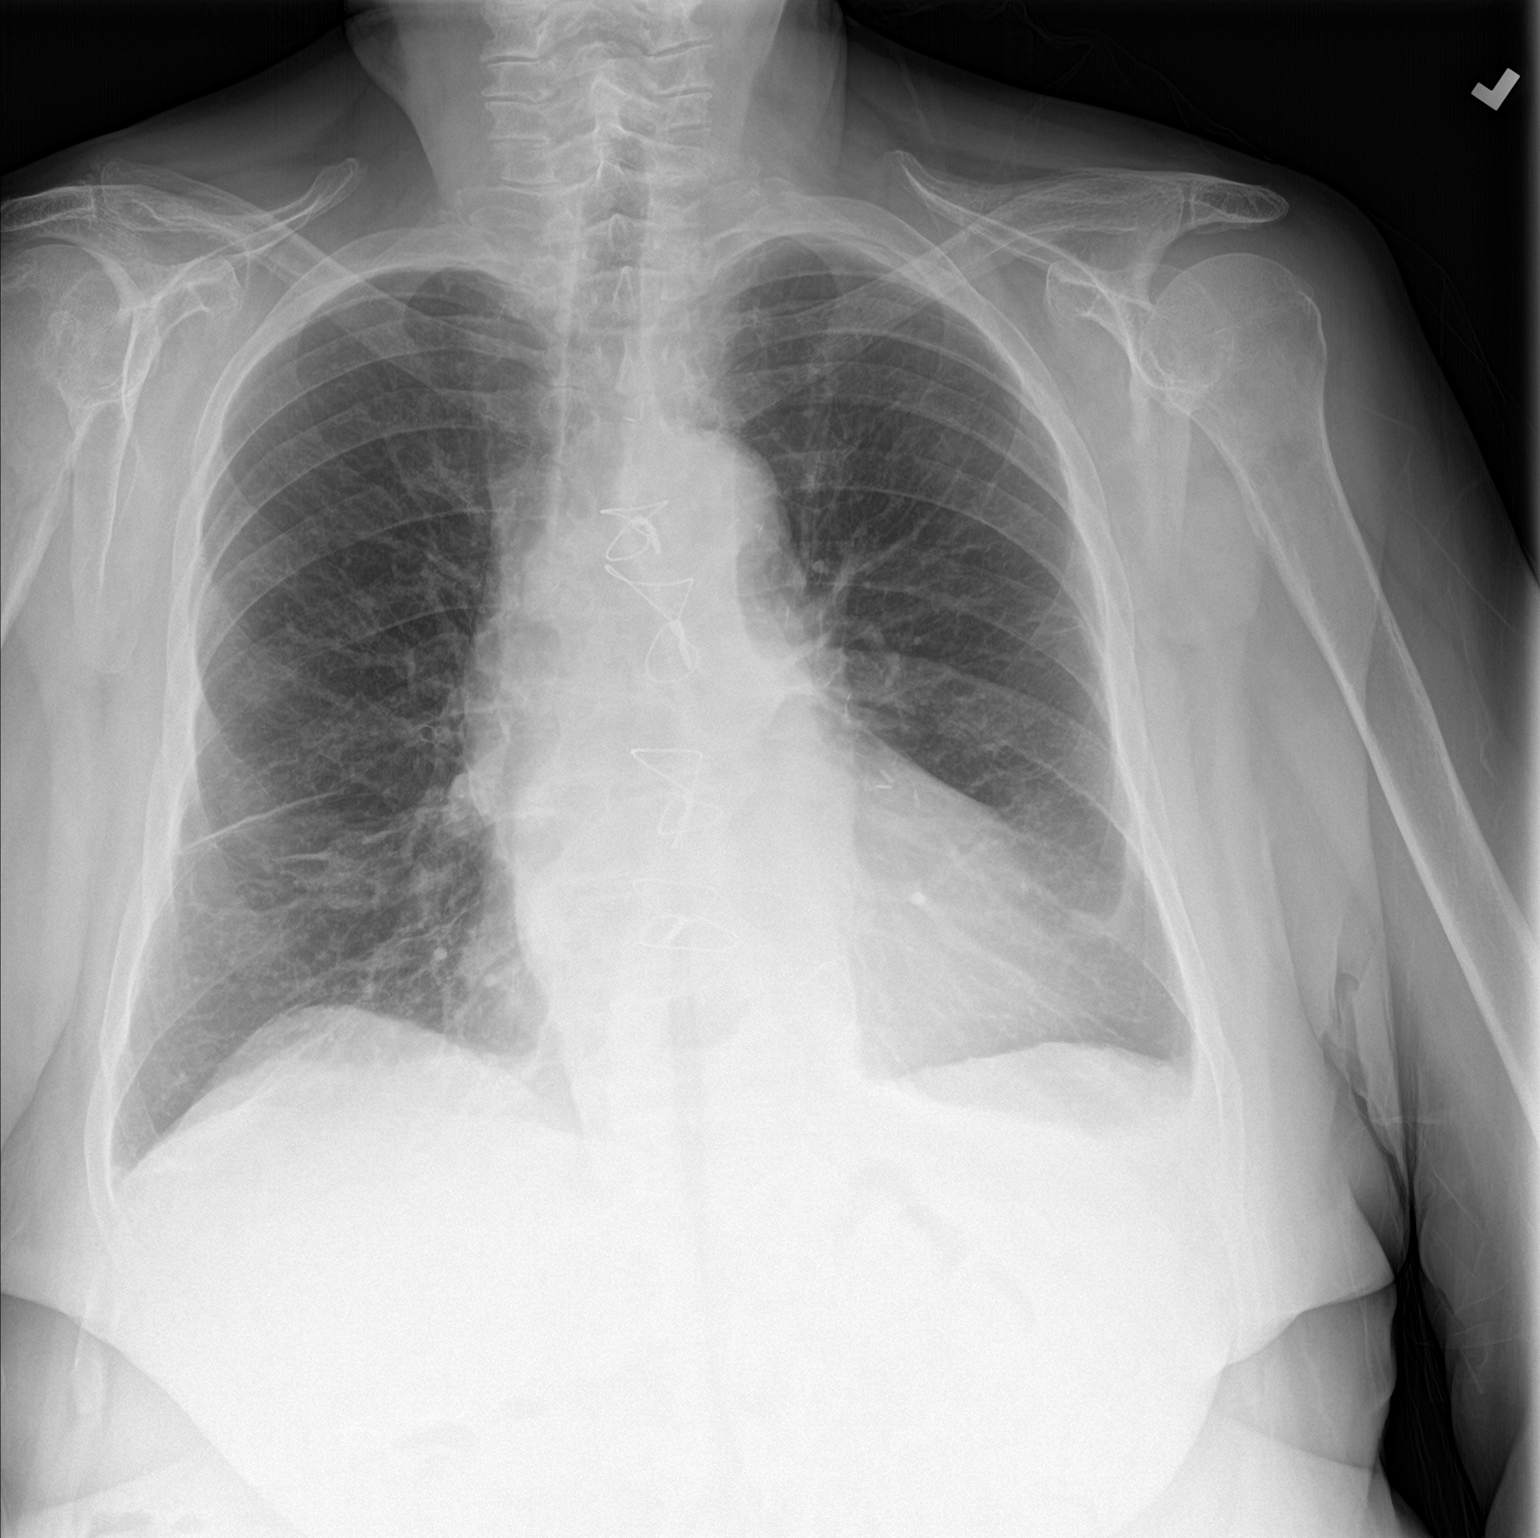

[chest lat]
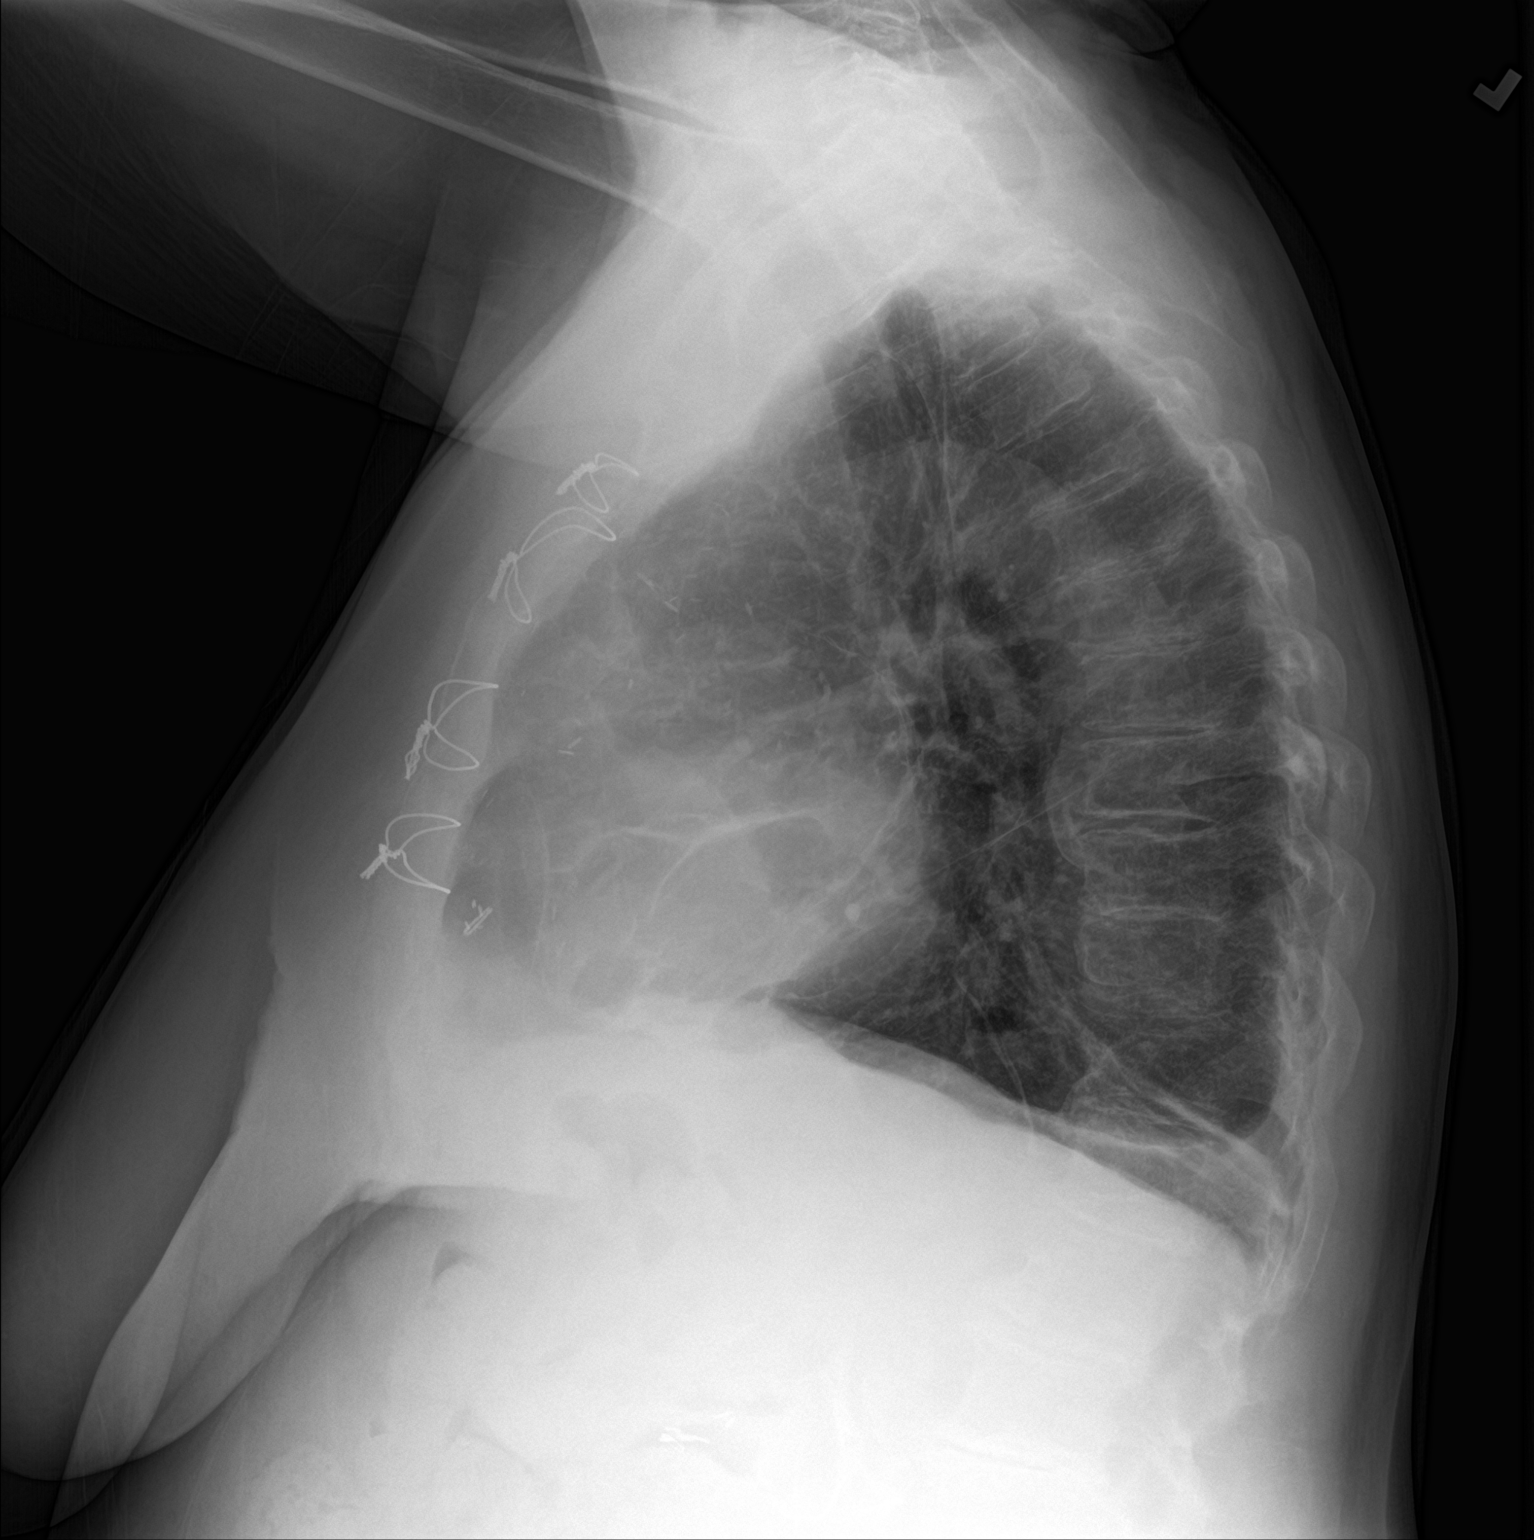

[2 of 2 positions shown; findings below may reference images not displayed]

FINDINGS: Post sternotomy changes. Minimal scarring at the lingula. Tiny left
pleural effusion with atelectasis at the left base. Scarring in the
right lower lung. Borderline to mild cardiomegaly. No pneumothorax.
Degenerative changes of the spine.
IMPRESSION: 1. Post sternotomy changes
2. Tiny left pleural effusion with linear scarring or atelectasis at
the lingula and lower lungs.

## 2019-08-05 ENCOUNTER — Telehealth: Payer: Self-pay

## 2019-08-05 NOTE — Telephone Encounter (Signed)
Copied from [place] [id number]. Topic: General - Other >> Aug 05, 2019 10:01 AM Carolyn Stare wrote: Dondra Spry with [facility] faxed over a form to stop pt clopidogrel (PLAVIX) 75 MG tablet for procedure that she is having on 08/15/2019 they sre requestiong to have the for fax back as soon as possible

## 2019-08-05 NOTE — Telephone Encounter (Signed)
Form was faxed back to [facility] yesterday (08/04/2019) note hand written on form per PCP that needs to go to Cardiology. Called and gave receptionist at [facility] (Venida Jarvis at [facility] was currently on the phone) the message that form was faxed back.

## 2019-08-18 IMAGING — CT CT ANGIO CHEST
2 of 9 series · 18 of 36 positions shown · IV contrast (iopamidol)
Comparison: Plain film from earlier in same day

CLINICAL DATA: Chest pain and shortness of breath

EXAM:
CT ANGIOGRAPHY CHEST WITH CONTRAST
TECHNIQUE: Multidetector CT imaging of the chest was performed using the
standard protocol during bolus administration of intravenous
contrast. Multiplanar CT image reconstructions and MIPs were
obtained to evaluate the vascular anatomy.
CONTRAST:  80mL JVG18T-LMK IOPAMIDOL (JVG18T-LMK) INJECTION 76%

[Series 7: pe coronal mpr · coronal · 0.52mm/px · 1 of 142 slices shown]
[im 71/142  mediastinal]
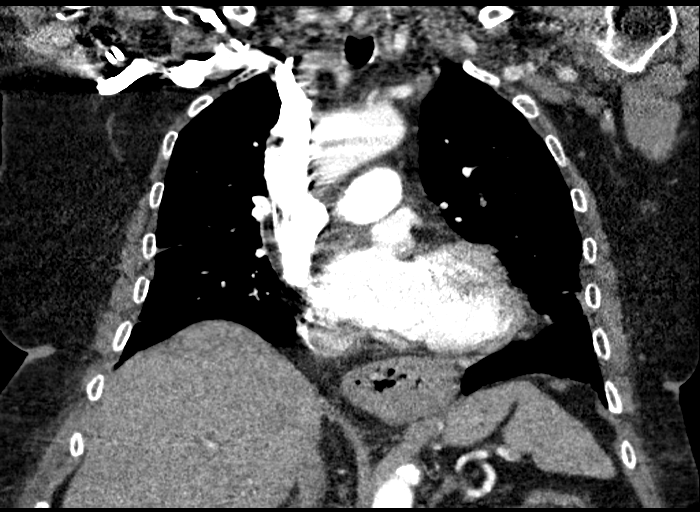

[Series 11: pe thins · axial · 0.75mm/px · z∈[-253,-22]mm · 17 of 259 slices shown]
[im 14/259  lung]
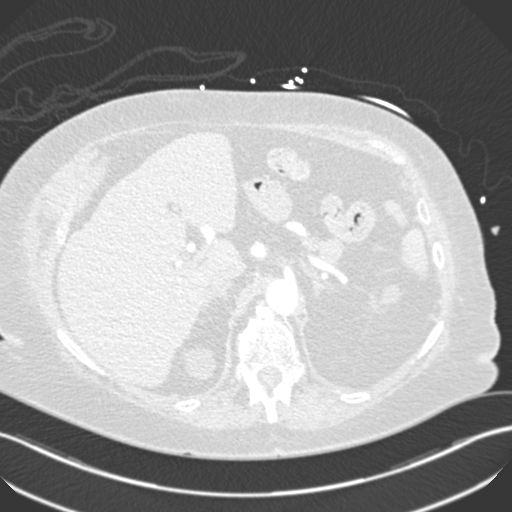
[im 28/259  mediastinal]
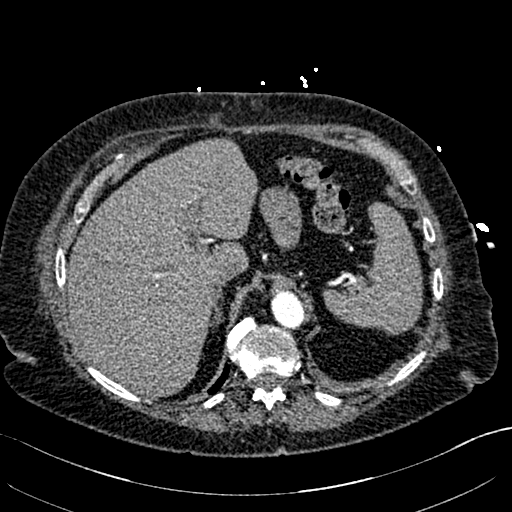
[im 41/259  lung]
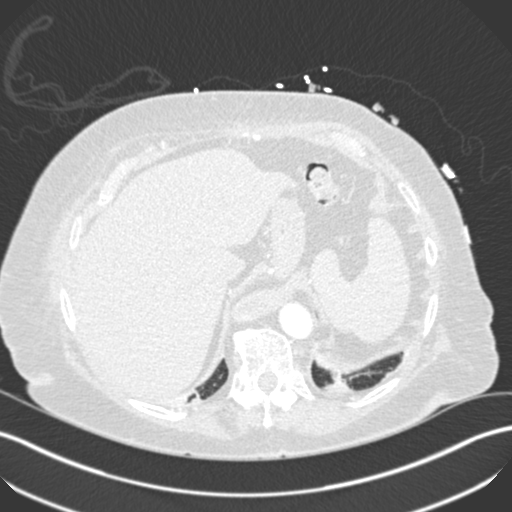
[im 55/259  mediastinal]
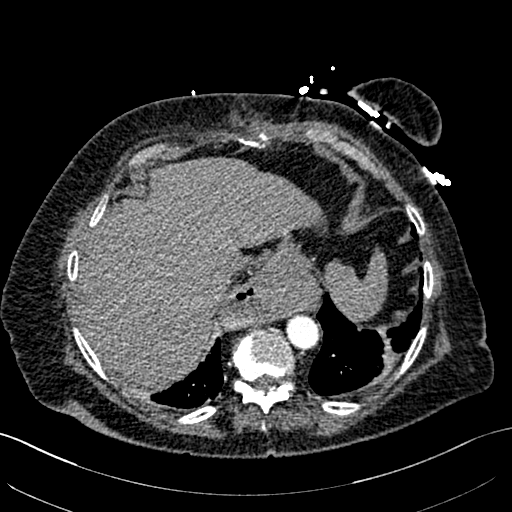
[im 68/259  lung]
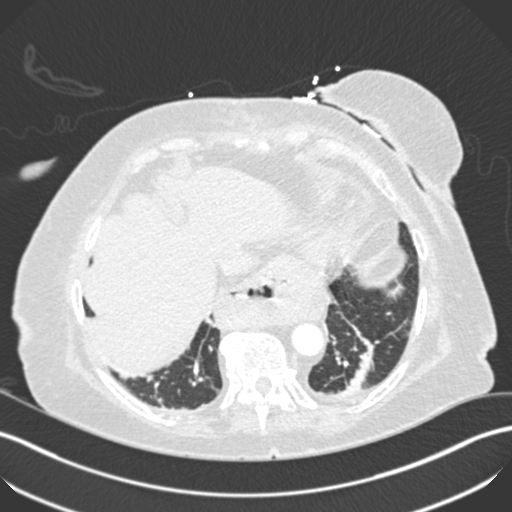
[im 82/259  mediastinal]
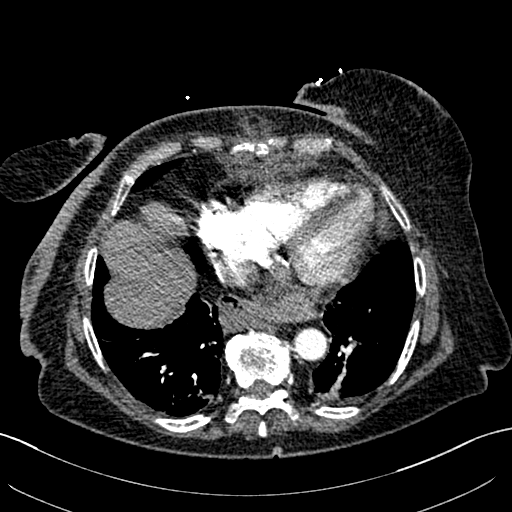
[im 96/259  lung]
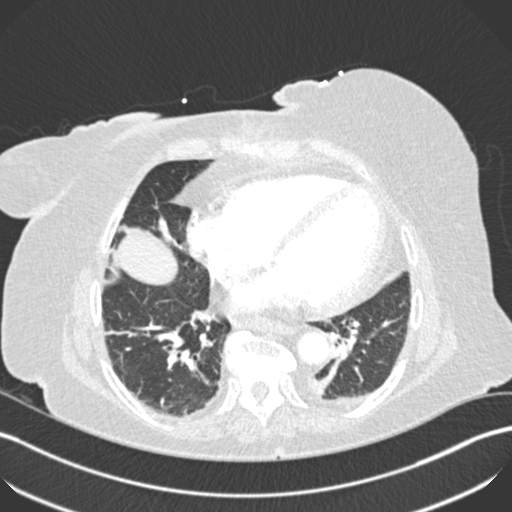
[im 109/259  mediastinal]
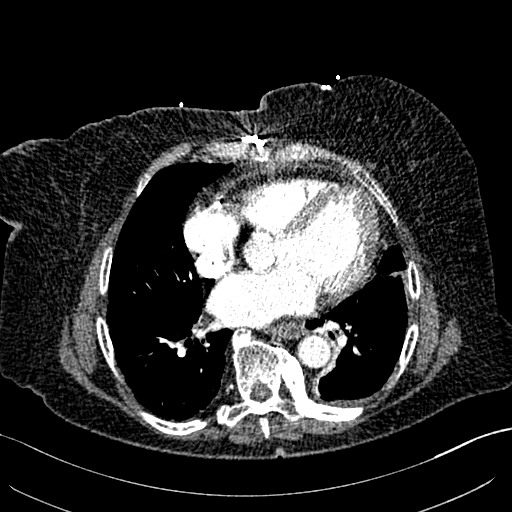
[im 136/259  lung]
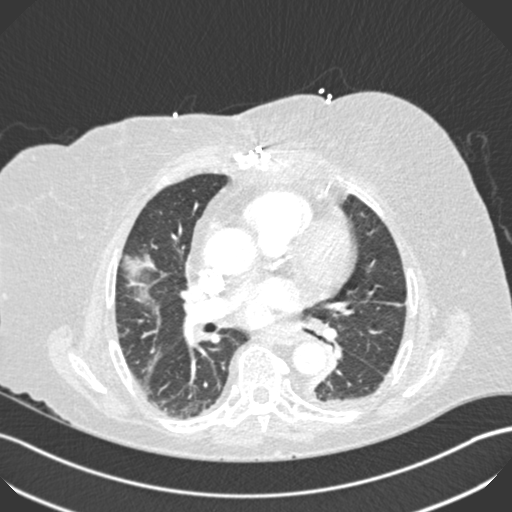
[im 150/259  mediastinal]
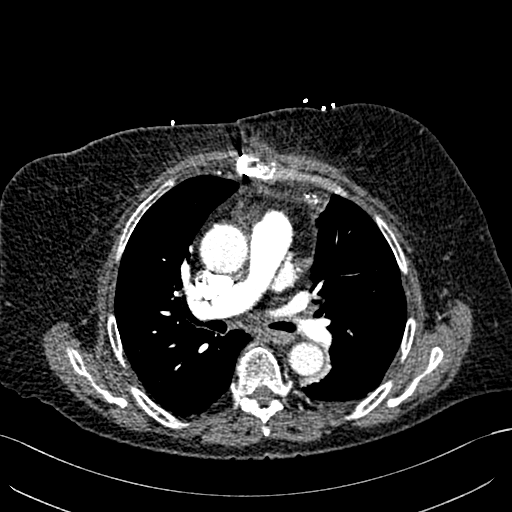
[im 163/259  lung]
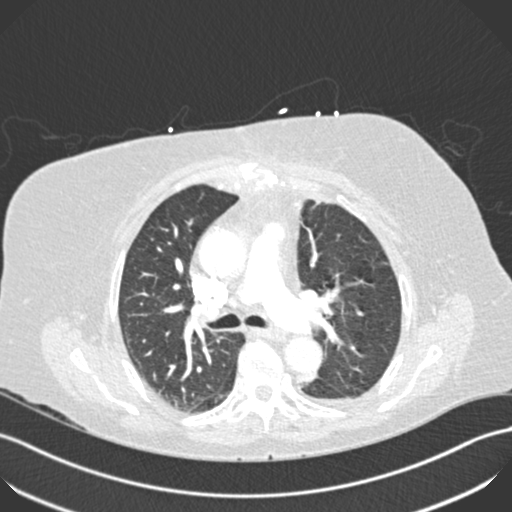
[im 177/259  mediastinal]
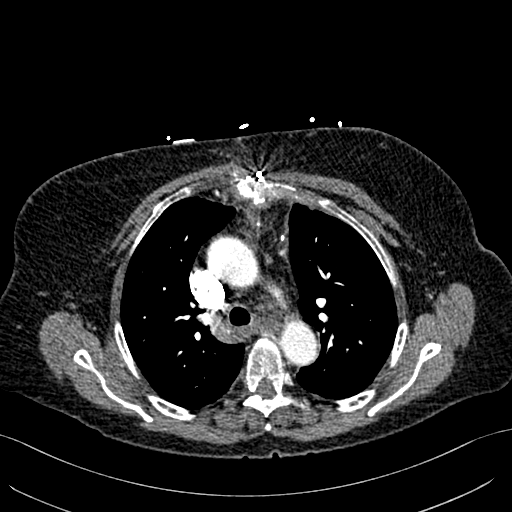
[im 191/259  lung]
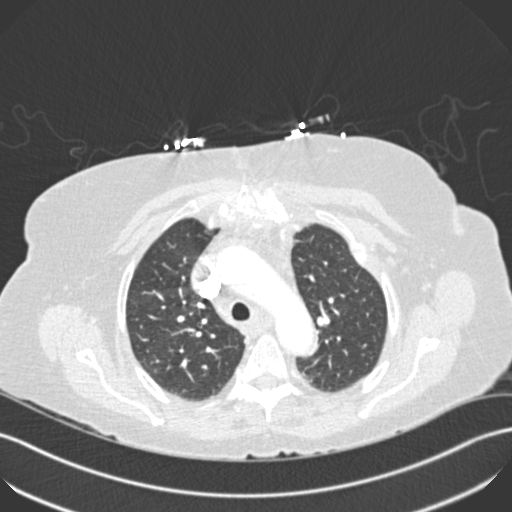
[im 204/259  mediastinal]
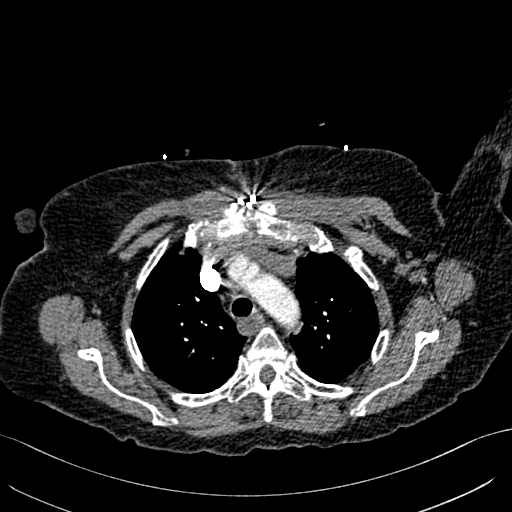
[im 218/259  lung]
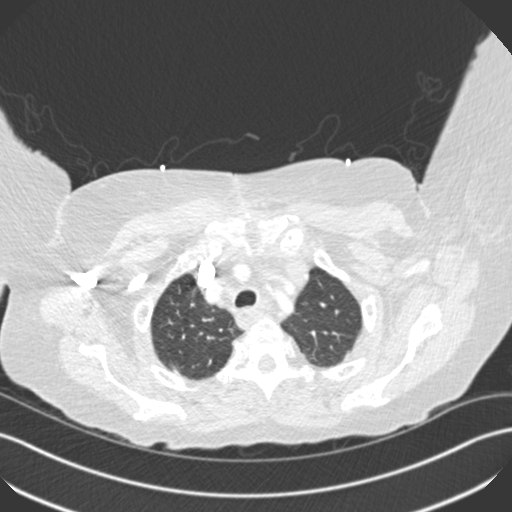
[im 231/259  mediastinal]
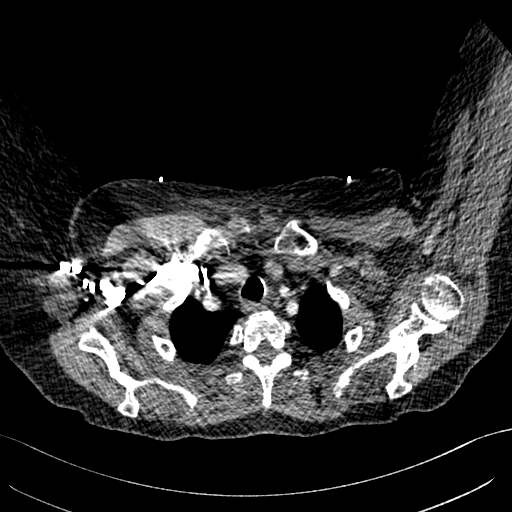
[im 245/259  lung]
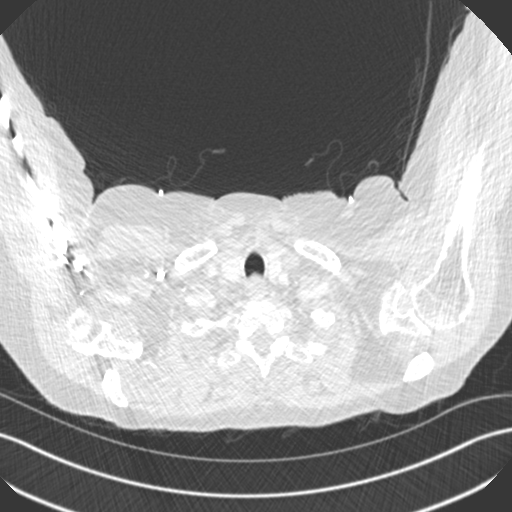

[18 of 36 positions shown; findings below may reference images not displayed]

FINDINGS: Cardiovascular: Thoracic aorta demonstrates mild atherosclerotic
calcifications. Changes of prior coronary bypass grafting are noted.
The pulmonary artery is well visualized with a normal branching
pattern. No filling defects to suggest acute pulmonary emboli are
identified. Coronary calcifications are seen. No cardiac enlargement
is noted.

Mediastinum/Nodes: The thoracic inlet demonstrates some
calcifications within the left lobe of the thyroid. No definitive
nodule is seen. Some fluid is noted within the esophagus although no
definitive obstructive changes are seen. Large sliding-type hiatal
hernia is noted. No hilar or mediastinal adenopathy is identified.

Lungs/Pleura: Lungs are well aerated bilaterally. Scattered
atelectatic changes are noted similar to that seen on recent chest
x-ray. There is a focal 6 mm noncalcified nodule identified in the
right upper lobe best seen on image number 22 of series 6. No other
significant nodular changes are noted.

Upper Abdomen: Changes of prior cholecystectomy. Bilateral renal
cysts are seen.

Musculoskeletal: Degenerative changes of the thoracic spine are
noted. Changes consistent with prior median sternotomy are noted.
There are multiple sternal and manubrial fractures identified of
uncertain chronicity.

Review of the MIP images confirms the above findings.
IMPRESSION: No evidence of pulmonary emboli.

6 mm right upper lobe nodule. Non-contrast chest CT at 6-12 months
is recommended. If the nodule is stable at time of repeat CT, then
future CT at 18-24 months (from today's scan) is considered optional
for low-risk patients, but is recommended for high-risk patients.
This recommendation follows the consensus statement: Guidelines for
Management of Incidental Pulmonary Nodules Detected on CT Images:

Changes consistent with sternal fractures of uncertain chronicity.
Correlation to point tenderness is recommended.

Aortic Atherosclerosis ([id number]).

## 2019-08-31 ENCOUNTER — Other Ambulatory Visit: Payer: Self-pay | Admitting: Family Medicine

## 2019-08-31 MED ORDER — CLOPIDOGREL BISULFATE 75 MG PO TABS: ORAL_TABLET | ORAL | 1 refills | Status: DC

## 2019-09-07 NOTE — Progress Notes (Signed)
Office Visit    Patient Name: Margaret Ward Date of Encounter: 09/08/2019  Primary Care Provider:  Sharlene Dory, DO Primary Cardiologist:  Norman Herrlich, MD Electrophysiologist:  None   Chief Complaint    Margaret Ward is a 78 y.o. female with a hx of CAD s/p CABG, hypertensive heart disease with heart failure presents today for 6 month follow up of cardiac conditions.   Past Medical History    Past Medical History:  Diagnosis Date  . Anemia   . Arthritis    "hands, arms, all over the place" (03/02/2018)  . CAD in native artery    a. s/p CABG in 01/2018. b. Recurrent CP 02/2018 -> cath showing early graft closure SVG-diagonal, received DES to D1.  . Chronic combined systolic and diastolic CHF (congestive heart failure) (HCC)    a. LVEF 40-45% by echo 02/2018.  Marland Kitchen Chronic lower back pain   . CKD (chronic kidney disease), stage III   . GERD (gastroesophageal reflux disease)   . Glass eye    right eye  . Gout    "on daily RX" (03/02/2018)  . History of kidney stones   . Hyperlipidemia   . Hypertension   . Hypertensive heart disease   . Kidney cysts   . Kidney disease   . Left ventricular aneurysm   . Migraine    "used to get them twice/week; haven't had them in a long time" (03/02/2018)  . NSTEMI (non-ST elevated myocardial infarction) (HCC) 03/01/2018  . On home oxygen therapy    "3L at night and prn during the day" (03/02/2018)  . Pneumonia    used to get it twice/year; stopped after I had hysterectomy" (03/02/2018)  . Sleep apnea   . Type II diabetes mellitus (HCC)    Past Surgical History:  Procedure Laterality Date  . BACK SURGERY    . BIOPSY  05/22/2018   Procedure: BIOPSY;  Surgeon: Kathi Der, MD;  Location: [facility];  Service: Gastroenterology;;  . CARDIAC CATHETERIZATION  12/2017   "before OHS"  . CHOLECYSTECTOMY OPEN  1982  . CORONARY ARTERY BYPASS GRAFT  01/19/2018   "CABG X3; in [place]"  . CORONARY STENT INTERVENTION N/A  03/02/2018   Procedure: CORONARY STENT INTERVENTION;  Surgeon: Kathleene Hazel, MD;  Location: [facility];  Service: Cardiovascular;  Laterality: N/A;  . DILATION AND CURETTAGE OF UTERUS    . ESOPHAGOGASTRODUODENOSCOPY (EGD) WITH PROPOFOL N/A 05/22/2018   Procedure: ESOPHAGOGASTRODUODENOSCOPY (EGD) WITH PROPOFOL;  Surgeon: Kathi Der, MD;  Location: [facility];  Service: Gastroenterology;  Laterality: N/A;  . ESOPHAGOGASTRODUODENOSCOPY (EGD) WITH PROPOFOL N/A 05/24/2018   Procedure: ESOPHAGOGASTRODUODENOSCOPY (EGD) WITH PROPOFOL;  Surgeon: Kathi Der, MD;  Location: [facility];  Service: Gastroenterology;  Laterality: N/A;  . 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 KNEE ARTHROSCOPY Right X 4  . LEFT HEART CATH AND CORS/GRAFTS ANGIOGRAPHY N/A 03/02/2018   Procedure: LEFT HEART CATH AND CORS/GRAFTS ANGIOGRAPHY;  Surgeon: Kathleene Hazel, MD;  Location: [facility];  Service: Cardiovascular;  Laterality: N/A;  . LUMBAR DISC SURGERY    . REDUCTION MAMMAPLASTY Bilateral X 2  . SCHLEROTHERAPY  05/24/2018   Procedure: Theresia Majors;  Surgeon: Kathi Der, MD;  Location: [facility];  Service: Gastroenterology;;  . TONSILLECTOMY  1953  . VAGINAL HYSTERECTOMY  1970    Allergies  Allergies  Allergen Reactions  . Methylprednisolone     Severe cramps legs and arms  . Keflex [  Cephalexin] Hives  . Ketorolac Swelling  . Morphine And Related     Itching   . Novocain [Procaine] Swelling  . Septra [Sulfamethoxazole-Trimethoprim] Nausea And Vomiting    History of Present Illness    [name] is a 78 y.o. female with a hx of CAD, CABG in March 2019 and subsequent NonSTEMI 03/01/2018 with PCI and stent  heart failure CKD and acute GI bleed with gastric ulcer.  She required 5 units of PRBC's and her dual antiplatelet therapy was discontinued with recurrent bleeding while hospitalized. She was last seen 12/28/2018.  She reports feeling  overwhelmed well.  Reports that she and her husband have moved into an apartment after living with her daughter for some time and are hoping to buy home soon.  She tells me she is ready to have a space to spread out and to call home again.  Enjoy spending her time reading, coloring on an app on her phone, or with grandchildren.  She reports no shortness of breath, no DOE.  She does have intermittent cough at night that she has oxygen at home for it and uses intermittently.  She reports no formal exercise regimen as she is limited by orthopedic pain in her knee.  She was encouraged to do as much activity as she can.  She reports no chest pain and has no concerns regarding her heart today.   EKGs/Labs/Other Studies Reviewed:   The following studies were reviewed today: 03/02/18 echocardiogram Study Conclusions   - Left ventricle: The cavity size was normal. There was mild   concentric hypertrophy. Systolic function was mildly to   moderately reduced. The estimated ejection fraction was in the   range of 40% to 45%. Mild diffuse hypokinesis. There was an   increased relative contribution of atrial contraction to   ventricular filling. Doppler parameters are consistent with   abnormal left ventricular relaxation (grade 1 diastolic   dysfunction). - Mitral valve: Calcified annulus. There was mild regurgitation. - Left atrium: The atrium was moderately to severely dilated. - Right atrium: The atrium was moderately dilated.  03/02/28 LHC  Prox LAD lesion is 100% stenosed.  Ost 1st Diag lesion is 95% stenosed.  Ost Ramus to Ramus lesion is 30% stenosed.  LIMA graft was visualized by angiography and is normal in caliber.  Origin lesion is 100% stenosed.  SVG graft was visualized by angiography and is normal in caliber.  Mid LM to Dist LM lesion is 40% stenosed.  Ost RPDA lesion is 99% stenosed.  Prox RCA to Mid RCA lesion is 40% stenosed.  A drug-eluting stent was successfully  placed using a STENT SIERRA 2.25 X 15 MM.  Post intervention, there is a 0% residual stenosis.   1. 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 The LAD is occluded proximally just beyond the takeoff of the early Diagonal branch. The entire LAD fills from the patent LIMA graft. The Diagonal branch (intermediate branch) has a 95% proximal stenosis. The vein graft that presumably went to this branch is occluded.  3. Successful PTCA/DES x 1 Diagonal (intermediate branch).  4. The Circumflex and another intermediate branch are small to moderate in caliber and have minor plaque.  5. The RCA is a large dominant vessel. There is diffuse 40% stenosis in the proximal/mid and distal RCA. The PDA is small to moderate in caliber. The ostium of the PDA has a 99% stenosis. The PDA fills from the patent vein graft.  Recommendations: Continue DAPT with ASA and Plavix for at least one year. Continue statin and beta blocker. Echo to assess LV function.    EKG:  No EKG today.  Recent Labs: 05/30/2019: Hemoglobin 12.8; Platelets 161 06/09/2019: ALT 16; BUN 29; Creatinine, Ser 1.15; Potassium 4.2; Sodium 142  Recent Lipid Panel    Component Value Date/Time   CHOL 100 08/26/2018 0920   CHOL 88 (L) 04/29/2018 0905   TRIG 93.0 08/26/2018 0920   HDL 47.90 08/26/2018 0920   HDL 50 04/29/2018 0905   CHOLHDL 2 08/26/2018 0920   VLDL 18.6 08/26/2018 0920   LDLCALC 34 08/26/2018 0920   LDLCALC 23 04/29/2018 0905    Home Medications   Current Meds  Medication Sig  . acetaminophen (TYLENOL) 500 MG tablet Take 500 mg by mouth every 6 (six) hours as needed for headache.  . allopurinol (ZYLOPRIM) 100 MG tablet TAKE 1 TABLET(100 MG) BY MOUTH DAILY  . atorvastatin (LIPITOR) 40 MG tablet Take 1 tablet (40 mg total) by mouth every evening.  . clopidogrel (PLAVIX) 75 MG tablet TAKE 1 TABLET(75 MG) BY MOUTH DAILY  . furosemide (LASIX) 40 MG tablet TAKE 1 TABLET BY MOUTH DAILY. TAKE 1  ADDITIONAL TABLET ON MONDAY, WEDNESDAY AND FRIDAY  . metoprolol tartrate (LOPRESSOR) 25 MG tablet Take 0.5 tablets (12.5 mg total) by mouth 2 (two) times daily.  . nitroGLYCERIN (NITROSTAT) 0.4 MG SL tablet Place 1 tablet (0.4 mg total) under the tongue every 5 (five) minutes x 3 doses as needed for chest pain.  . pantoprazole (PROTONIX) 40 MG tablet Take 1 tablet (40 mg total) by mouth 2 (two) times daily.  . potassium chloride (K-DUR) 10 MEQ tablet TAKE 2 TABLETS BY MOUTH WITH EVERY DOSE OF FUROSEMIDE  . vitamin C (ASCORBIC ACID) 500 MG tablet Take 500 mg by mouth 2 (two) times daily.  . [DISCONTINUED] atorvastatin (LIPITOR) 40 MG tablet Take 1 tablet (40 mg total) by mouth every evening. Patient due for follow up appointment. Needs to be schedule for further refills  . [DISCONTINUED] furosemide (LASIX) 40 MG tablet TAKE 1 TABLET BY MOUTH DAILY. TAKE 1 ADDITIONAL TABLET ON MONDAY, WEDNESDAY AND FRIDAY  . [DISCONTINUED] metoprolol tartrate (LOPRESSOR) 25 MG tablet Take 0.5 tablets (12.5 mg total) by mouth 2 (two) times daily.      Review of Systems       Review of Systems  Constitution: Negative for chills, fever and malaise/fatigue.  Cardiovascular: Negative for chest pain, dyspnea on exertion, irregular heartbeat, leg swelling, near-syncope, orthopnea and palpitations.  Respiratory: Negative for cough, shortness of breath and wheezing.   Musculoskeletal: Positive for joint pain (R knee arthritis).  Gastrointestinal: Negative for nausea and vomiting.  Neurological: Negative for dizziness, light-headedness and weakness.   All other systems reviewed and are otherwise negative except as noted above.  Physical Exam    VS:  BP 136/78 (BP Location: Left Arm, Patient Position: Sitting, Cuff Size: Normal)   Pulse (!) 101   Temp 98.2 F (36.8 C) (Temporal)   Ht 5\' 2"  (1.575 m)   Wt 171 lb (77.6 kg)   SpO2 95%   BMI 31.28 kg/m  , BMI Body mass index is 31.28 kg/m. GEN: Well nourished,  well developed, in no acute distress. HEENT: normal. Neck: Supple, no JVD, carotid bruits, or masses. Cardiac: RRR, no murmurs, rubs, or gallops. No clubbing, cyanosis, edema.  Radials/DP/PT 2+ and equal bilaterally.  Respiratory:  Respirations regular and unlabored, clear to auscultation bilaterally. GI: Soft,  nontender, nondistended, BS + x 4. MS: No deformity or atrophy. Skin: Warm and dry, no rash. Neuro:  Strength and sensation are intact. Psych: Normal affect.   Assessment & Plan    1. CAD- Stable no anginal symptoms.  GDMT Plavix, beta-blocker, statin, PRN nitroglycerin.  No indication for repeat ischemic evaluation at this time.  Regular cardiovascular exercise encouraged.  2. Chronic diastolic heart failure- Euvolemic on exam.  NYHA class I.  Continue present loop diuretic dose.  A refill will be provided.  3. Hypertensive heart disease with heart failure- Requested she purchase BP cuff to check routinely at home and she is agreeable.  BP mildly elevated today but on review of previous office visits with her PCP routinely well controlled.  Recommend BP goal of less than 130/80. If she requires additional therapy for her blood pressure with recommend ARB.  4. Hyperlipidemia- Lipid profile and direct LDL today as she is not fasting.  Continue present statin pending results.  Recommend LDL goal less than 70 in the setting of HLD.  Disposition: Follow up in 6 month(s) with Dr. Earney Navy, NP 09/08/2019, 9:02 PM

## 2019-09-08 ENCOUNTER — Encounter: Payer: Self-pay | Admitting: Family

## 2019-09-08 ENCOUNTER — Other Ambulatory Visit: Payer: Self-pay

## 2019-09-08 ENCOUNTER — Ambulatory Visit (INDEPENDENT_AMBULATORY_CARE_PROVIDER_SITE_OTHER): Payer: Medicare Other | Admitting: Family

## 2019-09-08 VITALS — BP 136/78 | HR 101 | Temp 98.2°F | Ht 62.0 in | Wt 171.0 lb

## 2019-09-08 DIAGNOSIS — I5032 Chronic diastolic (congestive) heart failure: Secondary | ICD-10-CM | POA: Diagnosis not present

## 2019-09-08 DIAGNOSIS — I25118 Atherosclerotic heart disease of native coronary artery with other forms of angina pectoris: Secondary | ICD-10-CM

## 2019-09-08 DIAGNOSIS — I110 Hypertensive heart disease with heart failure: Secondary | ICD-10-CM

## 2019-09-08 DIAGNOSIS — E785 Hyperlipidemia, unspecified: Secondary | ICD-10-CM | POA: Diagnosis not present

## 2019-09-08 MED ORDER — ATORVASTATIN CALCIUM 40 MG PO TABS: 40.0000 mg | ORAL_TABLET | Freq: Every evening | ORAL | 1 refills | Status: DC

## 2019-09-08 MED ORDER — METOPROLOL TARTRATE 25 MG PO TABS: 12.5000 mg | ORAL_TABLET | Freq: Two times a day (BID) | ORAL | 1 refills | Status: DC

## 2019-09-08 MED ORDER — FUROSEMIDE 40 MG PO TABS: ORAL_TABLET | ORAL | 1 refills | Status: DC

## 2019-09-08 NOTE — Patient Instructions (Addendum)
Medication Instructions:  No medication changes today.   A refill of your Atorvastatin, Metoprolol, and Lasix were sent to your pharmacy.   *If you need a refill on your cardiac medications before your next appointment, please call your pharmacy*  Lab Work: Your physician recommends that you return for lab work today: lipid profile, direct LDL  If you have labs (blood work) drawn today and your tests are completely normal, you will receive your results only by: Marland Kitchen MyChart Message (if you have MyChart) OR . A paper copy in the mail If you have any lab test that is abnormal or we need to change your treatment, we will call you to review the results.  Testing/Procedures: None ordered today.  Follow-Up: At [facility], you and your health needs are our priority.  As part of our continuing mission to provide you with exceptional heart care, we have created designated Provider Care Teams.  These Care Teams include your primary Cardiologist (physician) and Advanced Practice Providers (APPs -  Physician Assistants and Nurse Practitioners) who all work together to provide you with the care you need, when you need it.  Your next appointment:   6 months  The format for your next appointment:   In Person  Provider:   Shirlee More, MD  Other Instructions  Would recommend purchasing a blood pressure cuff. Recommend the one that goes on your arm and not on your wrist. If you notice your systolic blood pressure (the top number) is consistently greater than 130, please call our office or Dr. Nani Ravens.   Tips to Measure your Blood Pressure Correctly  To determine whether you have hypertension, a medical professional will take a blood pressure reading. How you prepare for the test, the position of your arm, and other factors can change a blood pressure reading by 10% or more. That could be enough to hide high blood pressure, start you on a drug you don't really need, or lead your doctor to  incorrectly adjust your medications.  National and international guidelines offer specific instructions for measuring blood pressure. If a doctor, nurse, or medical assistant isn't doing it right, don't hesitate to ask him or her to get with the guidelines.  Here's what you can do to ensure a correct reading: . Don't drink a caffeinated beverage or smoke during the 30 minutes before the test. . Sit quietly for five minutes before the test begins. . During the measurement, sit in a chair with your feet on the floor and your arm supported so your elbow is at about heart level. . The inflatable part of the cuff should completely cover at least 80% of your upper arm, and the cuff should be placed on bare skin, not over a shirt. . Don't talk during the measurement. . Have your blood pressure measured twice, with a brief break in between. If the readings are different by 5 points or more, have it done a third time.  There are times to break these rules. If you sometimes feel lightheaded when getting out of bed in the morning or when you stand after sitting, you should have your blood pressure checked while seated and then while standing to see if it falls from one position to the next.  Because blood pressure varies throughout the day, your doctor will rarely diagnose hypertension on the basis of a single reading. Instead, he or she will want to confirm the measurements on at least two occasions, usually within a few weeks of one  another. The exception to this rule is if you have a blood pressure reading of 180/110 mm Hg or higher. A result this high usually calls for prompt treatment.  It's a good idea to have your blood pressure measured in both arms at least once, since the reading in one arm (usually the right) may be higher than that in the left. A 2014 study in The American Journal of Medicine of nearly 3,400 people found average arm- to-arm differences in systolic blood pressure of about 5 points.  The higher number should be used to make treatment decisions.  In 2017, new guidelines from the American Heart Association, the [employer] of Cardiology, and nine other health organizations lowered the diagnosis of high blood pressure to 130/80 mm Hg or higher for all adults. The guidelines also redefined the various blood pressure categories to now include normal, elevated, Stage 1 hypertension, Stage 2 hypertension, and hypertensive crisis (see "Blood pressure categories").  Blood pressure categories  Blood pressure category SYSTOLIC (upper number)  DIASTOLIC (lower number)  Normal Less than 120 mm Hg and Less than 80 mm Hg  Elevated 120-129 mm Hg and Less than 80 mm Hg  High blood pressure: Stage 1 hypertension 130-139 mm Hg or 80-89 mm Hg  High blood pressure: Stage 2 hypertension 140 mm Hg or higher or 90 mm Hg or higher  Hypertensive crisis (consult your doctor immediately) Higher than 180 mm Hg and/or Higher than 120 mm Hg  Source: American Heart Association and American Stroke Association. For more on getting your blood pressure under control, buy Controlling Your Blood Pressure, a Special Health Report from [facility].   Blood Pressure Log   Date   Time  Blood Pressure  Position  Example: Nov 1 9 AM 124/78 sitting

## 2019-09-09 LAB — LIPID PANEL
Chol/HDL Ratio: 2.1 ratio (ref 0.0–4.4)
Cholesterol, Total: 103 mg/dL (ref 100–199)
HDL: 50 mg/dL (ref >39)
LDL Chol Calc (NIH): 28 mg/dL (ref 0–99)
Triglycerides: 149 mg/dL (ref 0–149)
VLDL Cholesterol Cal: 25 mg/dL (ref 5–40)

## 2019-09-09 LAB — LDL CHOLESTEROL, DIRECT: LDL Direct: 30 mg/dL (ref 0–99)

## 2019-09-22 ENCOUNTER — Other Ambulatory Visit: Payer: Self-pay | Admitting: Family Medicine

## 2019-09-22 MED ORDER — ALLOPURINOL 100 MG PO TABS: ORAL_TABLET | ORAL | 0 refills | Status: DC

## 2019-09-22 NOTE — Telephone Encounter (Signed)
Medication Refill - Medication: allopurinol (ZYLOPRIM) 100 MG tablet    Has the patient contacted their pharmacy? No. (Agent: If no, request that the patient contact the pharmacy for the refill.) (Agent: If yes, when and what did the pharmacy advise?)  Preferred Pharmacy (with phone number or street name):  [facility] [place], [address] [address]  Phone: [phone] Fax: [phone]  Not a 24 hour pharmacy; exact hours not known.     Agent: Please be advised that RX refills may take up to 3 business days. We ask that you follow-up with your pharmacy.

## 2019-10-07 ENCOUNTER — Ambulatory Visit: Payer: Medicare Other | Admitting: Family Medicine

## 2019-10-11 ENCOUNTER — Encounter: Payer: Self-pay | Admitting: Family Medicine

## 2019-10-11 ENCOUNTER — Other Ambulatory Visit: Payer: Self-pay

## 2019-10-11 ENCOUNTER — Ambulatory Visit (INDEPENDENT_AMBULATORY_CARE_PROVIDER_SITE_OTHER): Payer: Medicare Other | Admitting: Family Medicine

## 2019-10-11 VITALS — BP 128/84 | HR 84 | Temp 96.9°F | Ht 62.0 in | Wt 173.1 lb

## 2019-10-11 DIAGNOSIS — E1139 Type 2 diabetes mellitus with other diabetic ophthalmic complication: Secondary | ICD-10-CM

## 2019-10-11 DIAGNOSIS — M1A9XX0 Chronic gout, unspecified, without tophus (tophi): Secondary | ICD-10-CM

## 2019-10-11 DIAGNOSIS — G5603 Carpal tunnel syndrome, bilateral upper limbs: Secondary | ICD-10-CM

## 2019-10-11 HISTORY — DX: Carpal tunnel syndrome, bilateral upper limbs: G56.03

## 2019-10-11 HISTORY — DX: Chronic gout, unspecified, without tophus (tophi): M1A.9XX0

## 2019-10-11 LAB — MICROALBUMIN / CREATININE URINE RATIO
Creatinine,U: 23 mg/dL
Microalb Creat Ratio: 8.7 mg/g (ref 0.0–30.0)
Microalb, Ur: 2 mg/dL — ABNORMAL HIGH (ref 0.0–1.9)

## 2019-10-11 LAB — URIC ACID: Uric Acid, Serum: 5.4 mg/dL (ref 2.4–7.0)

## 2019-10-11 LAB — HEMOGLOBIN A1C: Hgb A1c MFr Bld: 7.9 % — ABNORMAL HIGH (ref 4.6–6.5)

## 2019-10-11 NOTE — Patient Instructions (Signed)
If you do not hear anything about your referral in the next 1-2 weeks, call our office and ask for an update.  Give [place] 2-3 business days to get the results of your labs back.   Keep the diet clean and stay active.  The issue in the hands may be a nerve issue related to diabetes.   Let us know if you need anything.

## 2019-10-11 NOTE — Progress Notes (Signed)
Subjective:   Chief Complaint  Patient presents with  . Follow-up    Margaret Ward is a 78 y.o. female here for follow-up of diabetes.  Here w daughter.  [name]'s self monitored glucose range is low 100's.  Patient denies hypoglycemic reactions. She checks her glucose levels 2 times per week. Patient does not require insulin.   Diet is healthy. Medications include: diet controlled Exercise: walks around the apartment  +hx of gout. Currently taking Allopurinol 100 mg/d.  No recent flares.  Reports compliance with allopurinol without side effects.  She has a several year history of numbness/burning in both of her hands.  It appears to be worse at night.  She has worn splints without relief.  She had an injection long ago that did not help.  She has been diagnosed with carpal tunnel syndrome.  Past Medical History:  Diagnosis Date  . Anemia   . Arthritis    "hands, arms, all over the place" (03/02/2018)  . CAD in native artery    a. s/p CABG in 01/2018. b. Recurrent CP 02/2018 -> cath showing early graft closure SVG-diagonal, received DES to D1.  . Chronic combined systolic and diastolic CHF (congestive heart failure) (HCC)    a. LVEF 40-45% by echo 02/2018.  Marland Kitchen Chronic lower back pain   . CKD (chronic kidney disease), stage III   . GERD (gastroesophageal reflux disease)   . Glass eye    right eye  . Gout    "on daily RX" (03/02/2018)  . History of kidney stones   . Hyperlipidemia   . Hypertension   . Hypertensive heart disease   . Kidney cysts   . Kidney disease   . Left ventricular aneurysm   . Migraine    "used to get them twice/week; haven't had them in a long time" (03/02/2018)  . NSTEMI (non-ST elevated myocardial infarction) (HCC) 03/01/2018  . On home oxygen therapy    "3L at night and prn during the day" (03/02/2018)  . Pneumonia    used to get it twice/year; stopped after I had hysterectomy" (03/02/2018)  . Sleep apnea   . Type II diabetes mellitus (HCC)       Related testing: Date of retinal exam: Due Pneumovax: done Flu Shot: done  Review of Systems: Pulmonary:  No SOB Cardiovascular:  No chest pain  Objective:  BP 128/84 (BP Location: Right Arm, Patient Position: Sitting, Cuff Size: Large)   Pulse 84   Temp (!) 96.9 F (36.1 C) (Temporal)   Ht 5\' 2"  (1.575 m)   Wt 173 lb 2 oz (78.5 kg)   SpO2 96%   BMI 31.66 kg/m  General:  Well developed, well nourished, in no apparent distress Skin:  Warm, no pallor or diaphoresis Head:  Normocephalic, atraumatic Eyes:  Pupils equal and round, sclera anicteric without injection  Lungs:  CTAB, no access msc use Cardio:  RRR, no bruits, no LE edema Musculoskeletal:  Symmetrical muscle groups noted without atrophy or deformity Neuro:  Sensation intact to pinprick on feet; diffuse decrease of sensation over hands Psych: Age appropriate judgment and insight  Assessment:   Type 2 diabetes mellitus with other ophthalmic complication, without long-term current use of insulin (HCC) - Plan: HgB A1c, Urine Microalbumin w/creat. ratio  Chronic gout without tophus, unspecified cause, unspecified site - Plan: Uric acid  Bilateral carpal tunnel syndrome - Plan: Ambulatory referral to Hand Surgery   Plan:   1- Cont care. Goal a1c is <8.  Needs to  call eye doctor for yearly appointment. 2-continue allopurinol, check urate. 3-we will refer to hand specialty team, though this may be stocking and glove neuropathy distribution from her diabetes.  She declined any medication at this time. F/u in 6 mo. The patient and her daughter voiced understanding and agreement to the plan.  [place], DO 10/11/19 12:04 PM

## 2019-10-25 ENCOUNTER — Ambulatory Visit: Payer: Medicare Other | Admitting: Orthopaedic Surgery

## 2019-11-22 ENCOUNTER — Encounter: Payer: Self-pay | Admitting: Orthopaedic Surgery

## 2019-11-22 ENCOUNTER — Ambulatory Visit (INDEPENDENT_AMBULATORY_CARE_PROVIDER_SITE_OTHER): Payer: Medicare Other | Admitting: Orthopaedic Surgery

## 2019-11-22 ENCOUNTER — Other Ambulatory Visit: Payer: Self-pay

## 2019-11-22 DIAGNOSIS — M79641 Pain in right hand: Secondary | ICD-10-CM | POA: Diagnosis not present

## 2019-11-22 DIAGNOSIS — M79642 Pain in left hand: Secondary | ICD-10-CM | POA: Diagnosis not present

## 2019-11-22 NOTE — Progress Notes (Signed)
Office Visit Note   Patient: Margaret Ward           Date of Birth: Feb 10, 1941           MRN: [id number] Visit Date: 11/22/2019              Requested by: Sharlene Dory, DO [address] [place],  [place] [address] PCP: Sharlene Dory, DO   Assessment & Plan: Visit Diagnoses:  1. Bilateral hand pain     Plan: Impression is bilateral carpal tunnel syndrome.  We will refer the patient to Dr. Alvester Morin for nerve conduction study/EMG.  She will follow-up with [place] once that is been completed.  Call with concerns or questions in the meantime.  Follow-Up Instructions: Return for after NCS/EMG.   Orders:  No orders of the defined types were placed in this encounter.  No orders of the defined types were placed in this encounter.     Procedures: No procedures performed   Clinical Data: No additional findings.   Subjective: Chief Complaint  Patient presents with  . Right Hand - Pain  . Left Hand - Pain    HPI patient is a pleasant 79 year old right-hand-dominant female who comes in today with bilateral hand pain left greater than right.  This has been ongoing for the past year.  No specific injury.  She does note that she crochets but really does not do a whole lot else with her hands.  She feels like she has blisters to all 10 fingertips.  She notes occasional swelling.  She notes numbness and tingling along the median nerve distribution worse in the middle the night in the morning.  She has been wearing wrist splints at night without significant relief of symptoms.  She does have a previous history of diabetes but is no longer considered diabetic.  She does not have a history of diabetic peripheral neuropathy.  She does have a significant cardiac history.  No history of cervical spine pathology.  No previous nerve conduction study/EMG bilateral upper extremities.  Review of Systems as detailed in HPI.  All others reviewed and are  negative.   Objective: Vital Signs: There were no vitals taken for this visit.  Physical Exam well-developed well-nourished female no acute distress.  Alert and oriented x3.  Ortho Exam examination of both upper extremities reveals decreased sensation to the thumb, index and long fingers.  She has continued numbness with Phalen's and Tinel's.  Mild thenar atrophy.  Decreased grip strength.  Specialty Comments:  No specialty comments available.  Imaging: No new imaging   PMFS History: Patient Active Problem List   Diagnosis Date Noted  . 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 Bilateral carpal tunnel syndrome 10/11/2019  . Carpal tunnel syndrome, bilateral 12/28/2018  . 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 Symptomatic anemia 05/21/2018  . Hypertension 05/21/2018  . 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 GERD (gastroesophageal reflux disease) 05/21/2018  . Type II diabetes mellitus (HCC) 05/21/2018  . Chronic combined systolic and diastolic heart failure (HCC) 05/21/2018  . Heme positive stool 05/21/2018  . HLD (hyperlipidemia) 05/21/2018  . Sleep apnea 05/21/2018  . Chronic hypoxemic respiratory failure (HCC) 05/21/2018  . CKD (chronic kidney disease), stage III 05/21/2018  . Diabetes mellitus type 2 in obese (HCC) 04/23/2018  . Chronic diastolic heart failure (HCC) 03/28/2018  . Bronchospasm 03/28/2018  . Sinus tachycardia 03/09/2018  . SOB (shortness of breath)  03/09/2018  . 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  . NSTEMI (non-ST elevated myocardial infarction) (HCC) 03/01/2018  . CAD (coronary artery disease) 02/08/2018  . Hyperlipidemia 02/08/2018  . Chest pain 02/05/2018  . 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   Past Medical History:  Diagnosis Date  . Anemia   . Arthritis    "hands, arms, all over the place" (03/02/2018)  . CAD in native artery    a. s/p CABG in 01/2018. b. Recurrent CP 02/2018 -> cath showing early graft  closure SVG-diagonal, received DES to D1.  . Chronic combined systolic and diastolic CHF (congestive heart failure) (HCC)    a. LVEF 40-45% by echo 02/2018.  Marland Kitchen Chronic lower back pain   . CKD (chronic kidney disease), stage III   . GERD (gastroesophageal reflux disease)   . Glass eye    right eye  . Gout    "on daily RX" (03/02/2018)  . History of kidney stones   . Hyperlipidemia   . Hypertension   . Hypertensive heart disease   . Kidney cysts   . Kidney disease   . Left ventricular aneurysm   . Migraine    "used to get them twice/week; haven't had them in a long time" (03/02/2018)  . NSTEMI (non-ST elevated myocardial infarction) (HCC) 03/01/2018  . On home oxygen therapy    "3L at night and prn during the day" (03/02/2018)  . Pneumonia    used to get it twice/year; stopped after I had hysterectomy" (03/02/2018)  . Sleep apnea   . Type II diabetes mellitus (HCC)     Family History  Problem Relation Age of Onset  . Heart Problems Mother   . Diabetes Father   . Kidney disease Sister   . Heart disease Brother   . Hypertension Brother   . Kidney disease Brother   . Hypertension Daughter   . Hypertension Daughter   . Hypertension Daughter   . Hypertension Daughter     Past Surgical History:  Procedure Laterality Date  . BACK SURGERY    . BIOPSY  05/22/2018   Procedure: BIOPSY;  Surgeon: Kathi Der, MD;  Location: [facility];  Service: Gastroenterology;;  . CARDIAC CATHETERIZATION  12/2017   "before [facility]"  . CHOLECYSTECTOMY OPEN  1982  . CORONARY ARTERY BYPASS GRAFT  01/19/2018   "CABG X3; in [place]"  . CORONARY STENT INTERVENTION N/A 03/02/2018   Procedure: CORONARY STENT INTERVENTION;  Surgeon: Kathleene Hazel, MD;  Location: [facility];  Service: Cardiovascular;  Laterality: N/A;  . DILATION AND CURETTAGE OF UTERUS    . ESOPHAGOGASTRODUODENOSCOPY (EGD) WITH PROPOFOL N/A 05/22/2018   Procedure: ESOPHAGOGASTRODUODENOSCOPY (EGD) WITH PROPOFOL;   Surgeon: Kathi Der, MD;  Location: [facility];  Service: Gastroenterology;  Laterality: N/A;  . ESOPHAGOGASTRODUODENOSCOPY (EGD) WITH PROPOFOL N/A 05/24/2018   Procedure: ESOPHAGOGASTRODUODENOSCOPY (EGD) WITH PROPOFOL;  Surgeon: Kathi Der, MD;  Location: [facility];  Service: Gastroenterology;  Laterality: N/A;  . 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 KNEE ARTHROSCOPY Right X 4  . LEFT HEART CATH AND CORS/GRAFTS ANGIOGRAPHY N/A 03/02/2018   Procedure: LEFT HEART CATH AND CORS/GRAFTS ANGIOGRAPHY;  Surgeon: Kathleene Hazel, MD;  Location: [facility];  Service: Cardiovascular;  Laterality: N/A;  . LUMBAR DISC SURGERY    . REDUCTION MAMMAPLASTY Bilateral X 2  . SCHLEROTHERAPY  05/24/2018   Procedure: Theresia Majors;  Surgeon: Kathi Der, MD;  Location: [facility];  Service: Gastroenterology;;  .  TONSILLECTOMY  1953  . VAGINAL HYSTERECTOMY  1970   Social History   Occupational History  . Not on file  Tobacco Use  . Smoking status: Former Smoker    Packs/day: 0.12    Years: 3.00    Pack years: 0.36    Types: Cigarettes    Quit date: 1963    Years since quitting: 58.0  . Smokeless tobacco: Never Used  Substance and Sexual Activity  . Alcohol use: Not Currently  . Drug use: Never  . Sexual activity: Not on file

## 2019-11-30 ENCOUNTER — Other Ambulatory Visit: Payer: Self-pay | Admitting: Family Medicine

## 2019-11-30 MED ORDER — ALLOPURINOL 100 MG PO TABS: ORAL_TABLET | ORAL | 0 refills | Status: DC

## 2019-12-22 DIAGNOSIS — K449 Diaphragmatic hernia without obstruction or gangrene: Secondary | ICD-10-CM | POA: Diagnosis not present

## 2019-12-22 DIAGNOSIS — K3189 Other diseases of stomach and duodenum: Secondary | ICD-10-CM | POA: Diagnosis not present

## 2019-12-22 DIAGNOSIS — Z8679 Personal history of other diseases of the circulatory system: Secondary | ICD-10-CM | POA: Diagnosis not present

## 2019-12-22 DIAGNOSIS — Z9049 Acquired absence of other specified parts of digestive tract: Secondary | ICD-10-CM | POA: Diagnosis not present

## 2019-12-22 DIAGNOSIS — K259 Gastric ulcer, unspecified as acute or chronic, without hemorrhage or perforation: Secondary | ICD-10-CM | POA: Diagnosis not present

## 2019-12-28 ENCOUNTER — Encounter: Payer: Medicare Other | Admitting: Physical Medicine and Rehabilitation

## 2020-01-03 ENCOUNTER — Encounter: Payer: Self-pay | Admitting: Physical Medicine and Rehabilitation

## 2020-01-03 ENCOUNTER — Ambulatory Visit (INDEPENDENT_AMBULATORY_CARE_PROVIDER_SITE_OTHER): Payer: Medicare Other | Admitting: Physical Medicine and Rehabilitation

## 2020-01-03 ENCOUNTER — Other Ambulatory Visit: Payer: Self-pay

## 2020-01-03 DIAGNOSIS — R202 Paresthesia of skin: Secondary | ICD-10-CM

## 2020-01-03 NOTE — Progress Notes (Signed)
.  Numeric Pain Rating Scale and Functional Assessment Average Pain 10   In the last MONTH (on 0-10 scale) has pain interfered with the following?  1. General activity like being  able to carry out your everyday physical activities such as walking, climbing stairs, carrying groceries, or moving a chair?  Rating(10)     

## 2020-01-04 ENCOUNTER — Other Ambulatory Visit: Payer: Self-pay | Admitting: Family Medicine

## 2020-01-04 MED ORDER — POTASSIUM CHLORIDE ER 10 MEQ PO TBCR: EXTENDED_RELEASE_TABLET | ORAL | 0 refills | Status: DC

## 2020-01-04 NOTE — Progress Notes (Signed)
Margaret Ward - 79 y.o. female MRN [id number]  Date of birth: 1941-01-02  Office Visit Note: Visit Date: 01/03/2020 PCP: Sharlene Dory, DO Referred by: Sharlene Dory*  Subjective: Chief Complaint  Patient presents with  . Left Hand - Numbness  . Right Hand - Numbness   HPI: Margaret Ward is a 79 y.o. female who comes in today At the request of Dr. Glee Arvin for electrodiagnostic study of both upper limbs.  Patient is right-hand dominant but with left more than right hand pain with numbness and tingling and weakness particularly in the radial 3 digits.  She reports more pain with using the hands at night.  She has not really found much in the way that will help the symptoms.  This is been ongoing off and on for many years but she really endorses the fact that seem to have increased since she had a heart attack over the last year or 2 and all of the hospitalizations since that time.  Her case is complicated by history of type 2 diabetes as well as history of heart disease status post CABG.  She does not carry a diagnosis of peripheral polyneuropathy.  She has had no prior electrodiagnostic studies.  She has no complaints of radicular pain down the arms.  She does rate her pain is a 10 out of 10.  ROS Otherwise per HPI.  Assessment & Plan: Visit Diagnoses:  1. 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 The lesion is characterized by sensory and motor demyelination with evidence of significant axonal injury particularly on the left.   There is no significant electrodiagnostic evidence of any other focal nerve entrapment, brachial plexopathy or cervical radiculopathy.   Recommendations: 1.  Follow-up with referring physician. 2.  Continue current  management of symptoms. 3.  Suggest surgical evaluation.  Meds & Orders: No orders of the defined types were placed in this encounter.   Orders Placed This Encounter  Procedures  . NCV with EMG (electromyography)    Follow-up: Return in about 2 weeks (around 01/17/2020) for  Glee Arvin, M.D..   Procedures: No procedures performed  EMG & NCV Findings: Evaluation of the left median motor and the right median motor nerves showed prolonged distal onset latency (L14.8, R8.9 ms), reduced amplitude (L0.1, R4.3 mV), and decreased conduction velocity (Elbow-Wrist, L35, R45 m/s).  The right ulnar motor nerve showed decreased conduction velocity (B Elbow-Wrist, 45 m/s) and decreased conduction velocity (A Elbow-B Elbow, 48 m/s).  The left median (across palm) sensory and the right median (across palm) sensory nerves showed no response (Wrist) and prolonged distal peak latency (Palm, L3.9, R4.5 ms).  The left ulnar sensory and the right ulnar sensory nerves showed reduced amplitude (L12.9, R10.0 V).  All remaining nerves (as indicated in the following tables) were within normal limits.  Left vs. Right side comparison data for the median motor nerve indicates abnormal L-R latency difference (5.9 ms), abnormal L-R amplitude difference (97.7 %), and abnormal L-R velocity difference (Elbow-Wrist, 10 m/s).  The ulnar motor nerve indicates abnormal L-R latency difference (0.8 ms) and abnormal L-R velocity difference (B Elbow-Wrist, 10 m/s).  All remaining left vs. right side differences were within normal limits.    All examined muscles (as indicated in the following table) showed no evidence of  electrical instability.    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 The lesion is characterized by sensory and motor demyelination with  evidence of significant axonal injury particularly on the left.   There is no significant electrodiagnostic evidence of any other focal nerve entrapment, brachial plexopathy or cervical radiculopathy.   Recommendations: 1.  Follow-up with referring physician. 2.  Continue current management of symptoms. 3.  Suggest surgical evaluation.  ___________________________ Naaman Plummer FAAPMR Board Certified, American Board of Physical Medicine and Rehabilitation    Nerve Conduction Studies Anti Sensory Summary Table   Stim Site NR Peak (ms) Norm Peak (ms) P-T Amp (V) Norm P-T Amp Site1 Site2 Delta-P (ms) Dist (cm) Vel (m/s) Norm Vel (m/s)  Left Median Acr Palm Anti Sensory (2nd Digit)  32.5C  Wrist *NR  <3.6  >10 Wrist Palm  0.0    Palm    *3.9 <2.0 5.4         Right Median Acr Palm Anti Sensory (3rd Digit)  32.1C  Wrist *NR  <3.6  >10 Wrist Palm  0.0    Palm    *4.5 <2.0 14.4         Left Radial Anti Sensory (Base 1st Digit)  32.6C  Wrist    2.3 <3.1 16.3  Wrist Base 1st Digit 2.3 0.0    Right Radial Anti Sensory (Base 1st Digit)  32.4C  Wrist    2.3 <3.1 12.9  Wrist Base 1st Digit 2.3 0.0    Left Ulnar Anti Sensory (5th Digit)  32.9C  Wrist    3.6 <3.7 *12.9 >15.0 Wrist 5th Digit 3.6 14.0 39 >38  Right Ulnar Anti Sensory (5th Digit)  32.7C  Wrist    3.6 <3.7 *10.0 >15.0 Wrist 5th Digit 3.6 14.0 39 >38  B Elbow    6.7  132.3  B Elbow Wrist 3.1 0.0  >47   Motor Summary Table   Stim Site NR Onset (ms) Norm Onset (ms) O-P Amp (mV) Norm O-P Amp Site1 Site2 Delta-0 (ms) Dist (cm) Vel (m/s) Norm Vel (m/s)  Left Median Motor (Abd Poll Brev)  32.9C  Wrist    *14.8 <4.2 *0.1 >5 Elbow Wrist 5.7 20.0 *35 >50  Elbow    20.5  0.1         Right Median Motor (Abd Poll Brev)  32.6C  Wrist    *8.9 <4.2 *4.3 >5 Elbow Wrist 4.4 20.0 *45 >50  Elbow    13.3  4.1         Left Ulnar Motor (Abd Dig Min)  33C  Wrist    2.7 <4.2 7.0 >3 B Elbow Wrist 3.3 18.0 55 >53  B Elbow    6.0  6.0  A Elbow  B Elbow 1.7 10.0 59 >53  A Elbow    7.7  6.4         Right Ulnar Motor (Abd Dig Min)  32.7C  Wrist    3.5 <4.2 5.6 >3 B Elbow Wrist 4.0 18.0 *45 >53  B Elbow    7.5  5.8  A Elbow B Elbow 2.1 10.0 *48 >53  A Elbow    9.6  5.7          EMG   Side Muscle Nerve Root Ins Act Fibs Psw Amp Dur Poly Recrt Int Dennie Bible Comment  Left Abd [place]  Brev Median C8-T1 Nml Nml Nml Nml Nml 0 Nml Nml   Left 1stDorInt Ulnar C8-T1 Nml Nml Nml Nml Nml 0 Nml Nml   Left PronatorTeres Median C6-7 Nml Nml Nml Nml Nml 0 Nml Nml   Left Biceps Musculocut C5-6 Nml Nml Nml Nml Nml 0 Nml Nml   Left Deltoid Axillary C5-6 Nml Nml Nml Nml Nml 0 Nml Nml     Nerve Conduction Studies Anti Sensory Left/Right Comparison   Stim Site L Lat (ms) R Lat (ms) L-R Lat (ms) L Amp (V) R Amp (V) L-R Amp (%) Site1 Site2 L Vel (m/s) R Vel (m/s) L-R Vel (m/s)  Median Acr Palm Anti Sensory (2nd Digit)  32.5C  Wrist       Wrist Palm     Palm *3.9 *4.5 0.6 5.4 14.4 62.5       Radial Anti Sensory (Base 1st Digit)  32.6C  Wrist 2.3 2.3 0.0 16.3 12.9 20.9 Wrist Base 1st Digit     Ulnar Anti Sensory (5th Digit)  32.9C  Wrist 3.6 3.6 0.0 *12.9 *10.0 22.5 Wrist 5th Digit 39 39 0   Motor Left/Right Comparison   Stim Site L Lat (ms) R Lat (ms) L-R Lat (ms) L Amp (mV) R Amp (mV) L-R Amp (%) Site1 Site2 L Vel (m/s) R Vel (m/s) L-R Vel (m/s)  Median Motor (Abd Poll Brev)  32.9C  Wrist *14.8 *8.9 *5.9 *0.1 *4.3 *97.7 Elbow Wrist *35 *45 *10  Elbow 20.5 13.3 7.2 0.1 4.1 97.6       Ulnar Motor (Abd Dig Min)  33C  Wrist 2.7 3.5 *0.8 7.0 5.6 20.0 B Elbow Wrist 55 *45 *10  B Elbow 6.0 7.5 1.5 6.0 5.8 3.3 A Elbow B Elbow 59 *48 11  A Elbow 7.7 9.6 1.9 6.4 5.7 10.9          Waveforms:                      Clinical History: No specialty comments available.   She reports that she quit smoking about 58 years ago. Her smoking use included cigarettes. She has a 0.36 pack-year smoking history. She has never used smokeless tobacco.  Recent  Labs    06/09/19 0831 10/11/19 1136  HGBA1C 7.9* 7.9*  LABURIC 6.1 5.4    Objective:  VS:  HT:    WT:   BMI:     BP:   HR: bpm  TEMP: ( )  RESP:  Physical Exam Musculoskeletal:        General: No swelling, tenderness or deformity.     Comments: Inspection reveals clubbing of both thumbs and flattening of the left APB more than the right but no atrophy of the bilateral FDI or hand intrinsics. There is no swelling, color changes, allodynia or dystrophic changes. There is 5 out of 5 strength in the bilateral wrist extension, finger abduction and long finger flexion.  There is decreased sensation in the left median nerve distribution more than right.   There is a negative Hoffmann's test bilaterally.  Skin:    General: Skin is warm and dry.     Findings: No erythema or rash.  Neurological:     General: No focal deficit present.     Mental Status: She is alert and oriented to person, place, and time.     Motor: No weakness or abnormal muscle tone.     Coordination: Coordination normal.  Psychiatric:  Mood and Affect: Mood normal.        Behavior: Behavior normal.     Ortho Exam Imaging: No results found.  Past Medical/Family/Surgical/Social History: Medications & Allergies reviewed per EMR, new medications updated. Patient Active Problem List   Diagnosis Date Noted  . 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 Bilateral carpal tunnel syndrome 10/11/2019  . Carpal tunnel syndrome, bilateral 12/28/2018  . 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 Symptomatic anemia 05/21/2018  . Hypertension 05/21/2018  . 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 GERD (gastroesophageal reflux disease) 05/21/2018  . Type II diabetes mellitus (HCC) 05/21/2018  . Chronic combined systolic and diastolic heart failure (HCC) 05/21/2018  . Heme positive stool 05/21/2018  . HLD (hyperlipidemia) 05/21/2018  . Sleep apnea 05/21/2018  . Chronic hypoxemic respiratory failure  (HCC) 05/21/2018  . CKD (chronic kidney disease), stage III 05/21/2018  . Diabetes mellitus type 2 in obese (HCC) 04/23/2018  . Chronic diastolic heart failure (HCC) 03/28/2018  . Bronchospasm 03/28/2018  . Sinus tachycardia 03/09/2018  . SOB (shortness of breath) 03/09/2018  . 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  . NSTEMI (non-ST elevated myocardial infarction) (HCC) 03/01/2018  . CAD (coronary artery disease) 02/08/2018  . Hyperlipidemia 02/08/2018  . Chest pain 02/05/2018  . 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   Past Medical History:  Diagnosis Date  . Anemia   . Arthritis    "hands, arms, all over the place" (03/02/2018)  . CAD in native artery    a. s/p CABG in 01/2018. b. Recurrent CP 02/2018 -> cath showing early graft closure SVG-diagonal, received DES to D1.  . Chronic combined systolic and diastolic CHF (congestive heart failure) (HCC)    a. LVEF 40-45% by echo 02/2018.  Marland Kitchen Chronic lower back pain   . CKD (chronic kidney disease), stage III   . GERD (gastroesophageal reflux disease)   . Glass eye    right eye  . Gout    "on daily RX" (03/02/2018)  . History of kidney stones   . Hyperlipidemia   . Hypertension   . Hypertensive heart disease   . Kidney cysts   . Kidney disease   . Left ventricular aneurysm   . Migraine    "used to get them twice/week; haven't had them in a long time" (03/02/2018)  . NSTEMI (non-ST elevated myocardial infarction) (HCC) 03/01/2018  . On home oxygen therapy    "3L at night and prn during the day" (03/02/2018)  . Pneumonia    used to get it twice/year; stopped after I had hysterectomy" (03/02/2018)  . Sleep apnea   . Type II diabetes mellitus (HCC)    Family History  Problem Relation Age of Onset  . Heart Problems Mother   . Diabetes Father   . Kidney disease Sister   . Heart disease Brother   . Hypertension Brother   . Kidney disease Brother   . Hypertension Daughter   . Hypertension Daughter   . Hypertension  Daughter   . Hypertension Daughter    Past Surgical History:  Procedure Laterality Date  . BACK SURGERY    . BIOPSY  05/22/2018   Procedure: BIOPSY;  Surgeon: Kathi Der, MD;  Location: [facility];  Service: Gastroenterology;;  . CARDIAC CATHETERIZATION  12/2017   "before OHS"  . CHOLECYSTECTOMY OPEN  1982  . CORONARY ARTERY BYPASS GRAFT  01/19/2018   "CABG X3; in "  . CORONARY STENT INTERVENTION N/A  03/02/2018   Procedure: CORONARY STENT INTERVENTION;  Surgeon: Kathleene Hazel, MD;  Location: [facility];  Service: Cardiovascular;  Laterality: N/A;  . DILATION AND CURETTAGE OF UTERUS    . ESOPHAGOGASTRODUODENOSCOPY (EGD) WITH PROPOFOL N/A 05/22/2018   Procedure: ESOPHAGOGASTRODUODENOSCOPY (EGD) WITH PROPOFOL;  Surgeon: Kathi Der, MD;  Location: [facility];  Service: Gastroenterology;  Laterality: N/A;  . ESOPHAGOGASTRODUODENOSCOPY (EGD) WITH PROPOFOL N/A 05/24/2018   Procedure: ESOPHAGOGASTRODUODENOSCOPY (EGD) WITH PROPOFOL;  Surgeon: Kathi Der, MD;  Location: [facility];  Service: Gastroenterology;  Laterality: N/A;  . 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 KNEE ARTHROSCOPY Right X 4  . LEFT HEART CATH AND CORS/GRAFTS ANGIOGRAPHY N/A 03/02/2018   Procedure: LEFT HEART CATH AND CORS/GRAFTS ANGIOGRAPHY;  Surgeon: Kathleene Hazel, MD;  Location: [facility];  Service: Cardiovascular;  Laterality: N/A;  . LUMBAR DISC SURGERY    . REDUCTION MAMMAPLASTY Bilateral X 2  . SCHLEROTHERAPY  05/24/2018   Procedure: Theresia Majors;  Surgeon: Kathi Der, MD;  Location: [facility];  Service: Gastroenterology;;  . TONSILLECTOMY  1953  . VAGINAL HYSTERECTOMY  1970   Social History   Occupational History  . Not on file  Tobacco Use  . Smoking status: Former Smoker    Packs/day: 0.12    Years: 3.00    Pack years: 0.36    Types: Cigarettes    Quit date: 1963    Years since quitting: 58.1  . Smokeless tobacco:  Never Used  Substance and Sexual Activity  . Alcohol use: Not Currently  . Drug use: Never  . Sexual activity: Not on file

## 2020-01-04 NOTE — Procedures (Signed)
EMG & NCV Findings: Evaluation of the left median motor and the right median motor nerves showed prolonged distal onset latency (L14.8, R8.9 ms), reduced amplitude (L0.1, R4.3 mV), and decreased conduction velocity (Elbow-Wrist, L35, R45 m/s).  The right ulnar motor nerve showed decreased conduction velocity (B Elbow-Wrist, 45 m/s) and decreased conduction velocity (A Elbow-B Elbow, 48 m/s).  The left median (across palm) sensory and the right median (across palm) sensory nerves showed no response (Wrist) and prolonged distal peak latency (Palm, L3.9, R4.5 ms).  The left ulnar sensory and the right ulnar sensory nerves showed reduced amplitude (L12.9, R10.0 V).  All remaining nerves (as indicated in the following tables) were within normal limits.  Left vs. Right side comparison data for the median motor nerve indicates abnormal L-R latency difference (5.9 ms), abnormal L-R amplitude difference (97.7 %), and abnormal L-R velocity difference (Elbow-Wrist, 10 m/s).  The ulnar motor nerve indicates abnormal L-R latency difference (0.8 ms) and abnormal L-R velocity difference (B Elbow-Wrist, 10 m/s).  All remaining left vs. right side differences were within normal limits.    All examined muscles (as indicated in the following table) showed no evidence of electrical instability.    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 The lesion is characterized by sensory and motor demyelination with evidence of significant axonal injury particularly on the left.   There is no significant electrodiagnostic evidence of any other focal nerve entrapment, brachial plexopathy or cervical radiculopathy.   Recommendations: 1.  Follow-up with referring physician. 2.  Continue current management of symptoms. 3.  Suggest surgical  evaluation.  ___________________________ Laurence Spates FAAPMR Board Certified, American Board of Physical Medicine and Rehabilitation    Nerve Conduction Studies Anti Sensory Summary Table   Stim Site NR Peak (ms) Norm Peak (ms) P-T Amp (V) Norm P-T Amp Site1 Site2 Delta-P (ms) Dist (cm) Vel (m/s) Norm Vel (m/s)  Left Median Acr Palm Anti Sensory (2nd Digit)  32.5C  Wrist *NR  <3.6  >10 Wrist Palm  0.0    Palm    *3.9 <2.0 5.4         Right Median Acr Palm Anti Sensory (3rd Digit)  32.1C  Wrist *NR  <3.6  >10 Wrist Palm  0.0    Palm    *4.5 <2.0 14.4         Left Radial Anti Sensory (Base 1st Digit)  32.6C  Wrist    2.3 <3.1 16.3  Wrist Base 1st Digit 2.3 0.0    Right Radial Anti Sensory (Base 1st Digit)  32.4C  Wrist    2.3 <3.1 12.9  Wrist Base 1st Digit 2.3 0.0    Left Ulnar Anti Sensory (5th Digit)  32.9C  Wrist    3.6 <3.7 *12.9 >15.0 Wrist 5th Digit 3.6 14.0 39 >38  Right Ulnar Anti Sensory (5th Digit)  32.7C  Wrist    3.6 <3.7 *10.0 >15.0 Wrist 5th Digit 3.6 14.0 39 >38  B Elbow    6.7  132.3  B Elbow Wrist 3.1 0.0  >47   Motor Summary Table   Stim Site NR Onset (ms) Norm Onset (ms) O-P Amp (mV) Norm O-P Amp Site1 Site2 Delta-0 (ms) Dist (cm) Vel (m/s) Norm Vel (m/s)  Left Median Motor (Abd Poll Brev)  32.9C  Wrist    *14.8 <4.2 *0.1 >5 Elbow Wrist  5.7 20.0 *35 >50  Elbow    20.5  0.1         Right Median Motor (Abd Poll Brev)  32.6C  Wrist    *8.9 <4.2 *4.3 >5 Elbow Wrist 4.4 20.0 *45 >50  Elbow    13.3  4.1         Left Ulnar Motor (Abd Dig Min)  33C  Wrist    2.7 <4.2 7.0 >3 B Elbow Wrist 3.3 18.0 55 >53  B Elbow    6.0  6.0  A Elbow B Elbow 1.7 10.0 59 >53  A Elbow    7.7  6.4         Right Ulnar Motor (Abd Dig Min)  32.7C  Wrist    3.5 <4.2 5.6 >3 B Elbow Wrist 4.0 18.0 *45 >53  B Elbow    7.5  5.8  A Elbow B Elbow 2.1 10.0 *48 >53  A Elbow    9.6  5.7          EMG   Side Muscle Nerve Root Ins Act Fibs Psw Amp Dur Poly Recrt Int Dennie Bible Comment  Left  Abd Poll Brev Median C8-T1 Nml Nml Nml Nml Nml 0 Nml Nml   Left 1stDorInt Ulnar C8-T1 Nml Nml Nml Nml Nml 0 Nml Nml   Left PronatorTeres Median C6-7 Nml Nml Nml Nml Nml 0 Nml Nml   Left Biceps Musculocut C5-6 Nml Nml Nml Nml Nml 0 Nml Nml   Left Deltoid Axillary C5-6 Nml Nml Nml Nml Nml 0 Nml Nml     Nerve Conduction Studies Anti Sensory Left/Right Comparison   Stim Site L Lat (ms) R Lat (ms) L-R Lat (ms) L Amp (V) R Amp (V) L-R Amp (%) Site1 Site2 L Vel (m/s) R Vel (m/s) L-R Vel (m/s)  Median Acr Palm Anti Sensory (2nd Digit)  32.5C  Wrist       Wrist Palm     Palm *3.9 *4.5 0.6 5.4 14.4 62.5       Radial Anti Sensory (Base 1st Digit)  32.6C  Wrist 2.3 2.3 0.0 16.3 12.9 20.9 Wrist Base 1st Digit     Ulnar Anti Sensory (5th Digit)  32.9C  Wrist 3.6 3.6 0.0 *12.9 *10.0 22.5 Wrist 5th Digit 39 39 0   Motor Left/Right Comparison   Stim Site L Lat (ms) R Lat (ms) L-R Lat (ms) L Amp (mV) R Amp (mV) L-R Amp (%) Site1 Site2 L Vel (m/s) R Vel (m/s) L-R Vel (m/s)  Median Motor (Abd Poll Brev)  32.9C  Wrist *14.8 *8.9 *5.9 *0.1 *4.3 *97.7 Elbow Wrist *35 *45 *10  Elbow 20.5 13.3 7.2 0.1 4.1 97.6       Ulnar Motor (Abd Dig Min)  33C  Wrist 2.7 3.5 *0.8 7.0 5.6 20.0 B Elbow Wrist 55 *45 *10  B Elbow 6.0 7.5 1.5 6.0 5.8 3.3 A Elbow B Elbow 59 *48 11  A Elbow 7.7 9.6 1.9 6.4 5.7 10.9          Waveforms:

## 2020-01-06 ENCOUNTER — Ambulatory Visit: Payer: Medicare Other | Admitting: Orthopaedic Surgery

## 2020-01-17 ENCOUNTER — Other Ambulatory Visit: Payer: Self-pay

## 2020-01-17 ENCOUNTER — Ambulatory Visit (INDEPENDENT_AMBULATORY_CARE_PROVIDER_SITE_OTHER): Payer: Medicare Other | Admitting: Orthopaedic Surgery

## 2020-01-17 ENCOUNTER — Encounter: Payer: Self-pay | Admitting: Orthopaedic Surgery

## 2020-01-17 VITALS — Ht 62.0 in | Wt 173.0 lb

## 2020-01-17 DIAGNOSIS — G5602 Carpal tunnel syndrome, left upper limb: Secondary | ICD-10-CM

## 2020-01-17 DIAGNOSIS — G5601 Carpal tunnel syndrome, right upper limb: Secondary | ICD-10-CM | POA: Diagnosis not present

## 2020-01-17 NOTE — Progress Notes (Signed)
Office Visit Note   Patient: Margaret Ward           Date of Birth: 1941-08-27           MRN: [id number] Visit Date: 01/17/2020              Requested by: Shelda Pal, [address] [place],  [place] [address] PCP: Shelda Pal, DO   Assessment & Plan: Visit Diagnoses:  1. Right carpal tunnel syndrome   2. Left carpal tunnel syndrome     Plan: Impression is severe bilateral carpal tunnel syndrome based on nerve conduction studies.  Based on our discussion of treatment options patient has elected to proceed with a right carpal tunnel release first.  Given her previous cardiac history we will obtain preoperative cardiac clearance from Dr. Bettina Gavia.  She will need to discontinue Plavix 5 days prior to the surgery.  Questions encouraged and answered.  Follow-Up Instructions: Return if symptoms worsen or fail to improve.   Orders:  No orders of the defined types were placed in this encounter.  No orders of the defined types were placed in this encounter.     Procedures: No procedures performed   Clinical Data: No additional findings.   Subjective: Chief Complaint  Patient presents with  . Right Hand - Follow-up  . Left Hand - Follow-up    Patient comes in today with her daughter for review of recent nerve conduction studies.  No change in medical history.   Review of Systems   Objective: Vital Signs: Ht 5\' 2"  (1.575 m)   Wt 173 lb (78.5 kg)   BMI 31.64 kg/m   Physical Exam  Ortho Exam Bilateral hand exam shows mild thenar atrophy. Specialty Comments:  No specialty comments available.  Imaging: No results found.   PMFS History: Patient Active Problem List   Diagnosis Date Noted  . 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 Bilateral carpal tunnel syndrome 10/11/2019  . Carpal tunnel syndrome, bilateral 12/28/2018  . 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 Symptomatic anemia 05/21/2018  . Hypertension 05/21/2018    . 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 GERD (gastroesophageal reflux disease) 05/21/2018  . Type II diabetes mellitus ([place]) 05/21/2018  . Chronic combined systolic and diastolic heart failure ([place]) 05/21/2018  . Heme positive stool 05/21/2018  . HLD (hyperlipidemia) 05/21/2018  . Sleep apnea 05/21/2018  . Chronic hypoxemic respiratory failure ([place]) 05/21/2018  . CKD (chronic kidney disease), stage III 05/21/2018  . Diabetes mellitus type 2 in obese ([place]) 04/23/2018  . Chronic diastolic heart failure ([place]) 03/28/2018  . Bronchospasm 03/28/2018  . Sinus tachycardia 03/09/2018  . SOB (shortness of breath) 03/09/2018  . Hypertensive heart disease with heart failure ([place]) 03/03/2018  . NSTEMI (non-ST elevated myocardial infarction) ([place] [place] [facility]) 03/01/2018  . CAD (coronary artery disease) 02/08/2018  . Hyperlipidemia 02/08/2018  . Chest pain 02/05/2018  . 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   Past Medical History:  Diagnosis Date  . Anemia   . Arthritis    "hands, arms, all over the place" (03/02/2018)  . CAD in native artery    a. s/p CABG in 01/2018. b. Recurrent CP 02/2018 -> cath showing early graft closure SVG-diagonal, received DES to D1.  . Chronic combined systolic and diastolic CHF (congestive heart failure) ([facility])    a. LVEF 40-45% by echo 02/2018.  Marland Kitchen Chronic lower back pain   . CKD (chronic kidney  disease), stage III   . GERD (gastroesophageal reflux disease)   . Glass eye    right eye  . Gout    "on daily RX" (03/02/2018)  . History of kidney stones   . Hyperlipidemia   . Hypertension   . Hypertensive heart disease   . Kidney cysts   . Kidney disease   . Left ventricular aneurysm   . Migraine    "used to get them twice/week; haven't had them in a long time" (03/02/2018)  . NSTEMI (non-ST elevated myocardial infarction) ([facility]) 03/01/2018  . On home oxygen therapy    "3L at night and prn during the day" (03/02/2018)  . Pneumonia    used to get it twice/year;  stopped after I had hysterectomy" (03/02/2018)  . Sleep apnea   . Type II diabetes mellitus ([facility])     Family History  Problem Relation Age of Onset  . Heart Problems Mother   . Diabetes Father   . Kidney disease Sister   . Heart disease Brother   . Hypertension Brother   . Kidney disease Brother   . Hypertension Daughter   . Hypertension Daughter   . Hypertension Daughter   . Hypertension Daughter     Past Surgical History:  Procedure Laterality Date  . BACK SURGERY    . BIOPSY  05/22/2018   Procedure: BIOPSY;  Surgeon: Kathi Der, MD;  Location: [facility];  Service: Gastroenterology;;  . CARDIAC CATHETERIZATION  12/2017   "before OHS"  . CHOLECYSTECTOMY OPEN  1982  . CORONARY ARTERY BYPASS GRAFT  01/19/2018   "CABG X3; in "  . CORONARY STENT INTERVENTION N/A 03/02/2018   Procedure: CORONARY STENT INTERVENTION;  Surgeon: Kathleene Hazel, MD;  [facility];  Service: Cardiovascular;  Laterality: N/A;  . DILATION AND CURETTAGE OF UTERUS    . ESOPHAGOGASTRODUODENOSCOPY (EGD) WITH PROPOFOL N/A 05/22/2018   Procedure: ESOPHAGOGASTRODUODENOSCOPY (EGD) WITH PROPOFOL;  Surgeon: Kathi Der, MD;  [facility];  Service: Gastroenterology;  Laterality: N/A;  . ESOPHAGOGASTRODUODENOSCOPY (EGD) WITH PROPOFOL N/A 05/24/2018   Procedure: ESOPHAGOGASTRODUODENOSCOPY (EGD) WITH PROPOFOL;  Surgeon: Kathi Der, MD;  [facility];  Service: Gastroenterology;  Laterality: N/A;  . 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 KNEE ARTHROSCOPY Right X 4  . LEFT HEART CATH AND CORS/GRAFTS ANGIOGRAPHY N/A 03/02/2018   Procedure: LEFT HEART CATH AND CORS/GRAFTS ANGIOGRAPHY;  Surgeon: Kathleene Hazel, MD;  [facility];  Service: Cardiovascular;  Laterality: N/A;  . LUMBAR DISC SURGERY    . REDUCTION MAMMAPLASTY Bilateral X 2  . SCHLEROTHERAPY  05/24/2018   Procedure: Theresia Majors;  Surgeon: Kathi Der, MD;  Location[facility];  Service: Gastroenterology;;  . TONSILLECTOMY  1953  . VAGINAL HYSTERECTOMY  1970   Social History   Occupational History  . Not on file  Tobacco Use  . Smoking status: Former Smoker    Packs/day: 0.12    Years: 3.00    Pack years: 0.36    Types: Cigarettes    Quit date: 1963    Years since quitting: 58.2  . Smokeless tobacco: Never Used  Substance and Sexual Activity  . Alcohol use: Not Currently  . Drug use: Never  . Sexual activity: Not on file

## 2020-01-27 DIAGNOSIS — Z23 Encounter for immunization: Secondary | ICD-10-CM | POA: Diagnosis not present

## 2020-02-03 ENCOUNTER — Telehealth: Payer: Self-pay | Admitting: *Deleted

## 2020-02-03 NOTE — Telephone Encounter (Signed)
Dr Dulce Sellar can you comment on holding Plavix in this patient prior to carpal tunnel release- 5 days requested.   "CABG in March 2019 and subsequent NonSTEMI 03/01/2018 with PCI and stent"    I'll contact her her once your recommendations are made.   Corine Shelter PA-C 02/03/2020 3:12 PM

## 2020-02-03 NOTE — Telephone Encounter (Signed)
She can hold Plavix 5 days preoperatively and typically resume 24 to 48 h after surgery she will need to ask the surgeon when to restart

## 2020-02-03 NOTE — Telephone Encounter (Signed)
   Primary Cardiologist: Norman Herrlich, MD  Chart reviewed and patient contacted by phone today as part of pre-operative protocol coverage. Given past medical history and time since last visit, based on ACC/AHA guidelines, Margaret Ward would be at acceptable risk for the planned procedure without further cardiovascular testing.   OK to hold Plavix 5 days pre op and resume as soon as possible post op.   I will route this recommendation to the requesting party via Epic fax function and remove from pre-op pool.  Please call with questions.  Corine Shelter, PA-C 02/03/2020, 4:32 PM

## 2020-02-03 NOTE — Telephone Encounter (Signed)
   [facility] Pre-operative Risk Assessment    Request for surgical clearance:  1. What type of surgery is being performed?  RIGHT CARPAL TUNNEL RELEASE   2. When is this surgery scheduled?  TBD   3. What type of clearance is required (medical clearance vs. Pharmacy clearance to hold med vs. Both)?  BOTH  4. Are there any medications that need to be held prior to surgery and how long? PLAVIX X'S 5 DAYS   5. Practice name and name of physician performing surgery?  [facility]   6. What is your office phone number [phone]    7.   What is your office fax number [phone]  8.   Anesthesia type (None, local, MAC, general) ?    Jeanann Lewandowsky 02/03/2020, 1:24 PM  _________________________________________________________________   (provider comments below)

## 2020-02-13 ENCOUNTER — Other Ambulatory Visit: Payer: Self-pay | Admitting: Family Medicine

## 2020-02-13 MED ORDER — POTASSIUM CHLORIDE ER 10 MEQ PO TBCR: EXTENDED_RELEASE_TABLET | ORAL | 0 refills | Status: DC

## 2020-02-22 ENCOUNTER — Other Ambulatory Visit: Payer: Self-pay | Admitting: Family

## 2020-02-22 ENCOUNTER — Other Ambulatory Visit: Payer: Self-pay | Admitting: Family Medicine

## 2020-02-22 DIAGNOSIS — I25118 Atherosclerotic heart disease of native coronary artery with other forms of angina pectoris: Secondary | ICD-10-CM

## 2020-02-22 MED ORDER — CLOPIDOGREL BISULFATE 75 MG PO TABS: ORAL_TABLET | ORAL | 1 refills | Status: DC

## 2020-02-24 DIAGNOSIS — Z23 Encounter for immunization: Secondary | ICD-10-CM | POA: Diagnosis not present

## 2020-03-01 ENCOUNTER — Telehealth: Payer: Self-pay | Admitting: Family Medicine

## 2020-03-01 NOTE — Telephone Encounter (Signed)
Patient states she needs a new script for the Plastic Nasal part to go to he cpap machine. She states the location she use to get it from wont supply her any bcuz the cpap machine is not from their location. Please advise how she can get the supplies she needs. Thanks

## 2020-03-02 ENCOUNTER — Other Ambulatory Visit: Payer: Self-pay

## 2020-03-02 NOTE — Telephone Encounter (Signed)
Called the patient to call her insurance to find out what location they prefer her to get her supplies at and she will call back with that information.

## 2020-03-05 ENCOUNTER — Telehealth: Payer: Self-pay | Admitting: Cardiology

## 2020-03-05 NOTE — Telephone Encounter (Signed)
Good choice okay with me

## 2020-03-05 NOTE — Progress Notes (Deleted)
Cardiology Office Note:    Date:  03/05/2020   ID:  Margaret Ward, DOB January 25, 1941, MRN [id number]  PCP:  Sharlene Dory, DO  Cardiologist:  Norman Herrlich, MD    Referring MD: Sharlene Dory*    ASSESSMENT:    No diagnosis found. PLAN:    In order of problems listed above:  1. ***   Next appointment: ***   Medication Adjustments/Labs and Tests Ordered: Current medicines are reviewed at length with the patient today.  Concerns regarding medicines are outlined above.  No orders of the defined types were placed in this encounter.  No orders of the defined types were placed in this encounter.   No chief complaint on file.   History of Present Illness:    Margaret Ward is a 79 y.o. female with a hx of CAD, CABG in March 2019 and subsequent NonSTEMI 03/01/2018 with PCI and stent  heart failure CKD and acute GI bleed with gastric ulcer.   She was last seen 12/28/2018. Compliance with diet, lifestyle and medications: *** Past Medical History:  Diagnosis Date  . Anemia   . Arthritis    "hands, arms, all over the place" (03/02/2018)  . Bilateral carpal tunnel syndrome 10/11/2019  . Bronchospasm 03/28/2018  . CAD (coronary artery disease) 02/08/2018   Left heart cath 01/13/18: 40% distal LMCAS 90% proximal LAD  LCF with 90% proximal ramus stenosis, diffuse RCA, 60% distal and 90% proximal PDA stenosis  . CAD in native artery    a. s/p CABG in 01/2018. b. Recurrent CP 02/2018 -> cath showing early graft closure SVG-diagonal, received DES to D1.  Marland Kitchen CAD/s/p CABG Mar 2019 w/ DES to vein graft Apr 2019 05/21/2018  . 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 Carpal tunnel syndrome, bilateral 12/28/2018  . Chest pain 02/05/2018  . Chronic combined systolic and diastolic CHF (congestive heart failure) (H[facility])    a. LVEF 40-45% by echo 02/2018.  Marland Kitchen Chronic combined systolic and diastolic heart failure ([facility]) 05/21/2018  . Chronic diastolic heart failure ([facility]) 03/28/2018  . Chronic  gout without tophus 10/11/2019  . Chronic hypoxemic respiratory failure ([facility]) 05/21/2018  . Chronic lower back pain   . CKD (chronic kidney disease), stage III   . Diabetes mellitus type 2 in obese ([facility]) 04/23/2018  . GERD (gastroesophageal reflux disease)   . Glass eye    right eye  . Gout    "on daily RX" (03/02/2018)  . Heme positive stool 05/21/2018  . History of kidney stones   . HLD (hyperlipidemia) 05/21/2018  . Hyperlipidemia   . Hypertension   . Hypertensive heart disease   . 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  . Kidney cysts   . Kidney disease   . Left ventricular aneurysm   . Migraine    "used to get them twice/week; haven't had them in a long time" (03/02/2018)  . NSTEMI (non-ST elevated myocardial infarction) ([facility]) 03/01/2018  . On home oxygen therapy    "3L at night and prn during the day" (03/02/2018)  . Pneumonia    used to get it twice/year; stopped after I had hysterectomy" (03/02/2018)  . 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 Sinus tachycardia 03/09/2018  . Sleep apnea   . SOB (shortness of breath) 03/09/2018  . Symptomatic anemia 05/21/2018  . Type II diabetes mellitus (H[facility])     Past Surgical History:  Procedure Laterality Date  . BACK SURGERY    . BIOPSY  05/22/2018  Procedure: BIOPSY;  Surgeon: Otis Brace, MD;  Location: [facility];  Service: Gastroenterology;;  . CARDIAC CATHETERIZATION  12/2017   "before OHS"  . CHOLECYSTECTOMY OPEN  1982  . CORONARY ARTERY BYPASS GRAFT  01/19/2018   "CABG X3; in [place]"  . CORONARY STENT INTERVENTION N/A 03/02/2018   Procedure: CORONARY STENT INTERVENTION;  Surgeon: Burnell Blanks, MD;  Location: [facility];  Service: Cardiovascular;  Laterality: N/A;  . DILATION AND CURETTAGE OF UTERUS    . ESOPHAGOGASTRODUODENOSCOPY (EGD) WITH PROPOFOL N/A 05/22/2018   Procedure: ESOPHAGOGASTRODUODENOSCOPY (EGD) WITH PROPOFOL;  Surgeon: Otis Brace, MD;  Location: [facility];  Service:  Gastroenterology;  Laterality: N/A;  . ESOPHAGOGASTRODUODENOSCOPY (EGD) WITH PROPOFOL N/A 05/24/2018   Procedure: ESOPHAGOGASTRODUODENOSCOPY (EGD) WITH PROPOFOL;  Surgeon: Otis Brace, MD;  [facility];  Service: Gastroenterology;  Laterality: N/A;  . 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 KNEE ARTHROSCOPY Right X 4  . LEFT HEART CATH AND CORS/GRAFTS ANGIOGRAPHY N/A 03/02/2018   Procedure: LEFT HEART CATH AND CORS/GRAFTS ANGIOGRAPHY;  Surgeon: Burnell Blanks, MD;  Location: [facility];  Service: Cardiovascular;  Laterality: N/A;  . LUMBAR [place]    . REDUCTION MAMMAPLASTY Bilateral X 2  . SCHLEROTHERAPY  05/24/2018   Procedure: [place] Ku;  Surgeon: Otis Brace, MD;  Location: [facility];  Service: Gastroenterology;;  . TONSILLECTOMY  1953  . VAGINAL HYSTERECTOMY  1970    Current Medications: No outpatient medications have been marked as taking for the 03/06/20 encounter (Appointment) with Richardo Priest, MD.     Allergies:   Methylprednisolone, Keflex [cephalexin], Ketorolac, Morphine and related, Novocain [procaine], and Septra [sulfamethoxazole-trimethoprim]   Social History   Socioeconomic History  . Marital status: Married    Spouse name: Not on file  . Number of children: Not on file  . Years of education: Not on file  . Highest education level: Not on file  Occupational History  . Not on file  Tobacco Use  . Smoking status: Former Smoker    Packs/day: 0.12    Years: 3.00    Pack years: 0.36    Types: Cigarettes    Quit date: 1963    Years since quitting: 58.3  . Smokeless tobacco: Never Used  Substance and Sexual Activity  . Alcohol use: Not Currently  . Drug use: Never  . Sexual activity: Not on file  Other Topics Concern  . Not on file  Social History Narrative  . Not on file   Social Determinants of Health   Financial Resource Strain:   . Difficulty of Paying Living Expenses:   Food Insecurity:   .  Worried About Charity fundraiser in the Last Year:   . Arboriculturist in the Last Year:   Transportation Needs:   . Film/video editor (Medical):   Marland Kitchen Lack of Transportation (Non-Medical):   Physical Activity:   . Days of Exercise per Week:   . Minutes of Exercise per Session:   Stress:   . Feeling of Stress :   Social Connections:   . Frequency of Communication with Friends and Family:   . Frequency of Social Gatherings with Friends and Family:   . Attends Religious Services:   . Active Member of Clubs or Organizations:   . Attends Archivist Meetings:   Marland Kitchen Marital Status:      Family History: The patient's ***family history includes Diabetes in her father; Heart Problems  in her mother; Heart disease in her brother; Hypertension in her brother, daughter, daughter, daughter, and daughter; Kidney disease in her brother and sister. ROS:   Please see the history of present illness.    All other systems reviewed and are negative.  EKGs/Labs/Other Studies Reviewed:    The following studies were reviewed today:  EKG:  EKG ordered today and personally reviewed.  The ekg ordered today demonstrates ***  Recent Labs: 05/30/2019: Hemoglobin 12.8; Platelets 161 06/09/2019: ALT 16; BUN 29; Creatinine, Ser 1.15; Potassium 4.2; Sodium 142  Recent Lipid Panel    Component Value Date/Time   CHOL 103 09/08/2019 1512   TRIG 149 09/08/2019 1512   HDL 50 09/08/2019 1512   CHOLHDL 2.1 09/08/2019 1512   CHOLHDL 2 08/26/2018 0920   VLDL 18.6 08/26/2018 0920   LDLCALC 28 09/08/2019 1512   LDLDIRECT 30 09/08/2019 1512    Physical Exam:    VS:  There were no vitals taken for this visit.    Wt Readings from Last 3 Encounters:  01/17/20 173 lb (78.5 kg)  10/11/19 173 lb 2 oz (78.5 kg)  09/08/19 171 lb (77.6 kg)     GEN: *** Well nourished, well developed in no acute distress HEENT: Normal NECK: No JVD; No carotid bruits LYMPHATICS: No lymphadenopathy CARDIAC: ***RRR, no  murmurs, rubs, gallops RESPIRATORY:  Clear to auscultation without rales, wheezing or rhonchi  ABDOMEN: Soft, non-tender, non-distended MUSCULOSKELETAL:  No edema; No deformity  SKIN: Warm and dry NEUROLOGIC:  Alert and oriented x 3 PSYCHIATRIC:  Normal affect    Signed, Norman Herrlich, MD  03/05/2020 1:06 PM    [facility]

## 2020-03-05 NOTE — Telephone Encounter (Signed)
New Messages  Pt is requesting to switch provider from Dr. Dulce Sellar to Dr. Servando Salina. She said [place] to [place] since [place] closer there. Dr. Dulce Sellar don't have anything available appt sooner at [place] she decided to see a different doctor. She also doesn't want to see APP/NP  Please advise

## 2020-03-06 ENCOUNTER — Ambulatory Visit: Payer: Medicare Other | Admitting: Cardiology

## 2020-03-12 NOTE — Telephone Encounter (Signed)
That will be fine with me. 

## 2020-03-12 NOTE — Telephone Encounter (Signed)
  Need documentation from both providers

## 2020-03-12 NOTE — Telephone Encounter (Signed)
  Called pt to schedule appt with Dr. Servando Salina at [facility] office. Both providers agreed with transfer of care

## 2020-03-28 ENCOUNTER — Other Ambulatory Visit: Payer: Self-pay | Admitting: Family Medicine

## 2020-04-04 ENCOUNTER — Other Ambulatory Visit: Payer: Self-pay | Admitting: Family

## 2020-04-04 DIAGNOSIS — E785 Hyperlipidemia, unspecified: Secondary | ICD-10-CM

## 2020-04-06 ENCOUNTER — Telehealth: Payer: Self-pay | Admitting: Cardiology

## 2020-04-06 NOTE — Telephone Encounter (Signed)
New Message:    Pt have an apt on Wednesday(04-12-20) with Dr Servando Salina.She would like for her daughter to come in with her please.

## 2020-04-06 NOTE — Telephone Encounter (Signed)
Spoke to the patient just now and let her know that her daughter can come to the appointment but she will have to wait in the lobby or in the car. We can call her daughter while the patient is back in the room so that her daughter can be involved during the entire visit. The patient verbalizes understanding and no other issues or concerns were noted at this time.     Encouraged patient to call back with any questions or concerns.

## 2020-04-10 ENCOUNTER — Ambulatory Visit: Payer: Medicare Other | Admitting: Family Medicine

## 2020-04-11 ENCOUNTER — Other Ambulatory Visit: Payer: Self-pay

## 2020-04-11 ENCOUNTER — Encounter: Payer: Self-pay | Admitting: Cardiology

## 2020-04-11 ENCOUNTER — Encounter: Payer: Self-pay | Admitting: *Deleted

## 2020-04-11 ENCOUNTER — Ambulatory Visit (INDEPENDENT_AMBULATORY_CARE_PROVIDER_SITE_OTHER): Payer: Medicare Other | Admitting: Cardiology

## 2020-04-11 VITALS — BP 146/78 | HR 83 | Ht 62.0 in | Wt 174.0 lb

## 2020-04-11 DIAGNOSIS — I2510 Atherosclerotic heart disease of native coronary artery without angina pectoris: Secondary | ICD-10-CM | POA: Diagnosis not present

## 2020-04-11 DIAGNOSIS — I519 Heart disease, unspecified: Secondary | ICD-10-CM | POA: Diagnosis not present

## 2020-04-11 DIAGNOSIS — R079 Chest pain, unspecified: Secondary | ICD-10-CM

## 2020-04-11 DIAGNOSIS — I5032 Chronic diastolic (congestive) heart failure: Secondary | ICD-10-CM

## 2020-04-11 DIAGNOSIS — R0602 Shortness of breath: Secondary | ICD-10-CM

## 2020-04-11 MED ORDER — ISOSORBIDE MONONITRATE ER 30 MG PO TB24
30.0000 mg | ORAL_TABLET | Freq: Every day | ORAL | 3 refills | Status: DC
Start: 2020-04-11 — End: 2020-12-26

## 2020-04-11 NOTE — Progress Notes (Signed)
Cardiology Office Note:    Date:  04/11/2020   ID:  Margaret Ward, DOB 1941/01/15, MRN [id number]  PCP:  Sharlene Dory, DO  Cardiologist:  Thomasene Ripple, DO  Electrophysiologist:  None   Referring MD: Sharlene Dory*   " I am here for a check-up, I am switching over to you"  History of Present Illness:    Margaret Ward is a 79 y.o. female with ahx of CAD, CABGin March 2019 and subsequentNonSTEMI 03/01/2018 with PCI and stent heart failure CKD and acute GI bleed with gastric ulcer- leading to transfusion of 5 units of PRBC's and her dual antiplatelet therapy was discontinued with recurrent bleeding while hospitalized. She was last seen 12/28/2018. 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 The patient was last seen by Lily Kocher in October 2020. At that visit she was stable from a CV standpoint. She was advised to get a bp cuff for home blood pressure check.  The patient is here today because she has been experiencing intermittent left-sided chest pain.  She tells me that is more pressure-like sensation sometimes is dull.  She quantifies this as about a 6 out of 10 and notes that recently has been also some tightness.  She admits to associated shortness with this.  Reports that she has been experiencing some sweating.  She tells me that he does not quite feel as when she had a heart attack back in 2019 because she did have some shoulder involvement.  However, she mentions that that left-sided pressure is what makes her wonder about her initial episode.  Her daughter did come in the visit closer to the end.  Past Medical History:  Diagnosis Date  . Anemia   . Arthritis    "hands, arms, all over the place" (03/02/2018)  . Bilateral carpal tunnel syndrome 10/11/2019  . Bronchospasm 03/28/2018  . CAD (coronary artery disease) 02/08/2018   Left heart cath 01/13/18: 40% distal LMCAS 90% proximal LAD   LCF with 90% proximal ramus stenosis, diffuse RCA, 60% distal and 90% proximal PDA stenosis  . CAD in native artery    a. s/p CABG in 01/2018. b. Recurrent CP 02/2018 -> cath showing early graft closure SVG-diagonal, received DES to D1.  Marland Kitchen CAD/s/p CABG Mar 2019 w/ DES to vein graft Apr 2019 05/21/2018  . 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 Carpal tunnel syndrome, bilateral 12/28/2018  . Chest pain 02/05/2018  . Chronic combined systolic and diastolic CHF (congestive heart failure) (HCC)    a. LVEF 40-45% by echo 02/2018.  Marland Kitchen Chronic combined systolic and diastolic heart failure (HCC) 05/21/2018  . Chronic diastolic heart failure (HCC) 03/28/2018  . 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 Chronic hypoxemic respiratory failure (HCC) 05/21/2018  . Chronic lower back pain   . CKD (chronic kidney disease), stage III   . Diabetes mellitus type 2 in obese (HCC) 04/23/2018  . GERD (gastroesophageal reflux disease)   . Glass eye    right eye  . Gout    "on daily RX" (03/02/2018)  . Heme positive stool 05/21/2018  . History of kidney stones   . HLD (hyperlipidemia) 05/21/2018  . Hyperlipidemia   . Hypertension   . Hypertensive heart disease   . 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  . Kidney cysts   . Kidney disease   . Left ventricular aneurysm   . Migraine    "used to get them twice/week; haven't had  them in a long time" (03/02/2018)  . NSTEMI (non-ST elevated myocardial infarction) (HCC) 03/01/2018  . On home oxygen therapy    "3L at night and prn during the day" (03/02/2018)  . Pneumonia    used to get it twice/year; stopped after I had hysterectomy" (03/02/2018)  . 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 Sinus tachycardia 03/09/2018  . Sleep apnea   . SOB (shortness of breath) 03/09/2018  . Symptomatic anemia 05/21/2018  . Type II diabetes mellitus (HCC)     Past Surgical History:  Procedure Laterality Date  . BACK SURGERY    . BIOPSY  05/22/2018   Procedure: BIOPSY;  Surgeon: Kathi Der, MD;  Location: [facility];  Service: Gastroenterology;;  . CARDIAC CATHETERIZATION  12/2017   "before OHS"  . CHOLECYSTECTOMY OPEN  1982  . CORONARY ARTERY BYPASS GRAFT  01/19/2018   "CABG X3; in [place]"  . CORONARY STENT INTERVENTION N/A 03/02/2018   Procedure: CORONARY STENT INTERVENTION;  Surgeon: Kathleene Hazel, MD;  Location: [facility];  Service: Cardiovascular;  Laterality: N/A;  . DILATION AND CURETTAGE OF UTERUS    . ESOPHAGOGASTRODUODENOSCOPY (EGD) WITH PROPOFOL N/A 05/22/2018   Procedure: ESOPHAGOGASTRODUODENOSCOPY (EGD) WITH PROPOFOL;  Surgeon: Kathi Der, MD;  Location: [facility];  Service: Gastroenterology;  Laterality: N/A;  . ESOPHAGOGASTRODUODENOSCOPY (EGD) WITH PROPOFOL N/A 05/24/2018   Procedure: ESOPHAGOGASTRODUODENOSCOPY (EGD) WITH PROPOFOL;  Surgeon: Kathi Der, MD;  Location: [facility];  Service: Gastroenterology;  Laterality: N/A;  . 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 KNEE ARTHROSCOPY Right X 4  . LEFT HEART CATH AND CORS/GRAFTS ANGIOGRAPHY N/A 03/02/2018   Procedure: LEFT HEART CATH AND CORS/GRAFTS ANGIOGRAPHY;  Surgeon: Kathleene Hazel, MD;  Location: [facility];  Service: Cardiovascular;  Laterality: N/A;  . LUMBAR DISC SURGERY    . REDUCTION MAMMAPLASTY Bilateral X 2  . SCHLEROTHERAPY  05/24/2018   Procedure: Theresia Majors;  Surgeon: Kathi Der, MD;  Location: [facility];  Service: Gastroenterology;;  . TONSILLECTOMY  1953  . VAGINAL HYSTERECTOMY  1970    Current Medications: Current Meds  Medication Sig  . acetaminophen (TYLENOL) 500 MG tablet Take 500 mg by mouth every 6 (six) hours as needed for headache.  . allopurinol (ZYLOPRIM) 100 MG tablet TAKE 1 TABLET(100 MG) BY MOUTH DAILY  . atorvastatin (LIPITOR) 40 MG tablet TAKE 1 TABLET(40 MG) BY MOUTH EVERY EVENING  . bimatoprost (LUMIGAN) 0.03 % ophthalmic solution Place 1 drop into the left eye at bedtime.  . clopidogrel  (PLAVIX) 75 MG tablet TAKE 1 TABLET(75 MG) BY MOUTH DAILY  . furosemide (LASIX) 40 MG tablet TAKE 1 TABLET BY MOUTH DAILY. TAKE 1 ADDITIONAL TABLET ON MONDAY, WEDNESDAY AND FRIDAY  . metoprolol succinate (TOPROL-XL) 25 MG 24 hr tablet Take 25 mg by mouth 2 (two) times daily.  . nitroGLYCERIN (NITROSTAT) 0.4 MG SL tablet Place 1 tablet (0.4 mg total) under the tongue every 5 (five) minutes x 3 doses as needed for chest pain.  . pantoprazole (PROTONIX) 20 MG tablet Take 20 mg by mouth daily.  . potassium chloride (KLOR-CON) 10 MEQ tablet TAKE 2 TABLETS BY MOUTH WITH EVERY DOSE OF FUROSEMIDE  . vitamin C (ASCORBIC ACID) 500 MG tablet Take 500 mg by mouth 2 (two) times daily.     Allergies:   Methylprednisolone, Keflex [cephalexin], Ketorolac, Morphine and related, Novocain [procaine], and Septra [sulfamethoxazole-trimethoprim]   Social History   Socioeconomic History  . Marital status: Married  Spouse name: Not on file  . Number of children: Not on file  . Years of education: Not on file  . Highest education level: Not on file  Occupational History  . Not on file  Tobacco Use  . Smoking status: Former Smoker    Packs/day: 0.12    Years: 3.00    Pack years: 0.36    Types: Cigarettes    Quit date: 1963    Years since quitting: 58.4  . Smokeless tobacco: Never Used  Substance and Sexual Activity  . Alcohol use: Not Currently  . Drug use: Never  . Sexual activity: Not on file  Other Topics Concern  . Not on file  Social History Narrative  . Not on file   Social Determinants of Health   Financial Resource Strain:   . Difficulty of Paying Living Expenses:   Food Insecurity:   . Worried About Programme researcher, broadcasting/film/video in the Last Year:   . Barista in the Last Year:   Transportation Needs:   . Freight forwarder (Medical):   Marland Kitchen Lack of Transportation (Non-Medical):   Physical Activity:   . Days of Exercise per Week:   . Minutes of Exercise per Session:   Stress:   .  Feeling of Stress :   Social Connections:   . Frequency of Communication with Friends and Family:   . Frequency of Social Gatherings with Friends and Family:   . Attends Religious Services:   . Active Member of Clubs or Organizations:   . Attends Banker Meetings:   Marland Kitchen Marital Status:      Family History: The patient's family history includes Diabetes in her father; Heart Problems in her mother; Heart disease in her brother; Hypertension in her brother, daughter, daughter, daughter, and daughter; Kidney disease in her brother and sister.  ROS:   Review of Systems  Constitution: Negative for decreased appetite, fever and weight gain.  HENT: Negative for congestion, ear discharge, hoarse voice and sore throat.   Eyes: Negative for discharge, redness, vision loss in right eye and visual halos.  Cardiovascular: Reports chest pain.negative for dyspnea on exertion, leg swelling, orthopnea and palpitations.  Respiratory: Negative for cough, hemoptysis, shortness of breath and snoring.   Endocrine: Negative for heat intolerance and polyphagia.  Hematologic/Lymphatic: Negative for bleeding problem. Does not bruise/bleed easily.  Skin: Negative for flushing, nail changes, rash and suspicious lesions.  Musculoskeletal: Negative for arthritis, joint pain, muscle cramps, myalgias, neck pain and stiffness.  Gastrointestinal: Negative for abdominal pain, bowel incontinence, diarrhea and excessive appetite.  Genitourinary: Negative for decreased libido, genital sores and incomplete emptying.  Neurological: Negative for brief paralysis, focal weakness, headaches and loss of balance.  Psychiatric/Behavioral: Negative for altered mental status, depression and suicidal ideas.  Allergic/Immunologic: Negative for HIV exposure and persistent infections.    EKGs/Labs/Other Studies Reviewed:    The following studies were reviewed today:   EKG:  The ekg ordered today demonstrates sinus rhythm,  heart rate 83 bpm, with old anterior wall infarction, compared to EKG done in February 2020 no significant change.  03/02/18 echocardiogram Study Conclusions  - Left ventricle: The cavity size was normal. There was mild concentric hypertrophy. Systolic function was mildly to moderately reduced. The estimated ejection fraction was in the range of 40% to 45%. Mild diffuse hypokinesis. There was an increased relative contribution of atrial contraction to ventricular filling. Doppler parameters are consistent with abnormal left ventricular relaxation (grade 1 diastolic dysfunction). -  Mitral valve: Calcified annulus. There was mild regurgitation. - Left atrium: The atrium was moderately to severely dilated. - Right atrium: The atrium was moderately dilated.  03/02/28 LHC  Prox LAD lesion is 100% stenosed.  Ost 1st Diag lesion is 95% stenosed.  Ost Ramus to Ramus lesion is 30% stenosed.  LIMA graft was visualized by angiography and is normal in caliber.  Origin lesion is 100% stenosed.  SVG graft was visualized by angiography and is normal in caliber.  Mid LM to Dist LM lesion is 40% stenosed.  Ost RPDA lesion is 99% stenosed.  Prox RCA to Mid RCA lesion is 40% stenosed.  A drug-eluting stent was successfully placed using a STENT SIERRA 2.25 X 15 MM.  Post intervention, there is a 0% residual stenosis.  1. 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 The LAD is occluded proximally just beyond the takeoff of the early Diagonal branch. The entire LAD fills from the patent LIMA graft. The Diagonal branch (intermediate branch) has a 95% proximal stenosis. The vein graft that presumably went to this branch is occluded.  3. Successful PTCA/DES x 1 Diagonal (intermediate branch).  4. The Circumflex and another intermediate branch are small to moderate in caliber and have minor plaque.  5. The RCA is a large dominant vessel. There is diffuse 40%  stenosis in the proximal/mid and distal RCA. The PDA is small to moderate in caliber. The ostium of the PDA has a 99% stenosis. The PDA fills from the patent vein graft.   Recent Labs: 05/30/2019: Hemoglobin 12.8; Platelets 161 06/09/2019: ALT 16; BUN 29; Creatinine, Ser 1.15; Potassium 4.2; Sodium 142  Recent Lipid Panel    Component Value Date/Time   CHOL 103 09/08/2019 1512   TRIG 149 09/08/2019 1512   HDL 50 09/08/2019 1512   CHOLHDL 2.1 09/08/2019 1512   CHOLHDL 2 08/26/2018 0920   VLDL 18.6 08/26/2018 0920   LDLCALC 28 09/08/2019 1512   LDLDIRECT 30 09/08/2019 1512    Physical Exam:    VS:  BP (!) 146/78   Pulse 83   Ht  (1.575 m)   Wt 174 lb (78.9 kg)   SpO2 96%   BMI 31.83 kg/m     Wt Readings from Last 3 Encounters:  04/11/20 174 lb (78.9 kg)  01/17/20 173 lb (78.5 kg)  10/11/19 173 lb 2 oz (78.5 kg)     GEN: Well nourished, well developed in no acute distress HEENT: Normal NECK: No JVD; No carotid bruits LYMPHATICS: No lymphadenopathy CARDIAC: S1S2 noted,RRR, no murmurs, rubs, gallops RESPIRATORY:  Clear to auscultation without rales, wheezing or rhonchi  ABDOMEN: Soft, non-tender, non-distended, +bowel sounds, no guarding. EXTREMITIES: No edema, No cyanosis, no clubbing MUSCULOSKELETAL:  No deformity  SKIN: Warm and dry NEUROLOGIC:  Alert and oriented x 3, non-focal PSYCHIATRIC:  Normal affect, good insight  ASSESSMENT:    1. SOB (shortness of breath)   2. Chest pain, unspecified type   3. Chronic diastolic heart failure (HCC)   4. CAD/s/p CABG Mar 2019 w/ DES to vein graft Apr 2019   5. Coronary artery disease involving native heart, angina presence unspecified, unspecified vessel or lesion type    PLAN:     1.  I am concerned about progression of coronary artery disease.  Therefore like to pursue a pharmacologic nuclear stress test to assess this.  I discussed with the patient and her daughter during agreement with this at this time.  In the  meantime I will start her on long-acting nitrates Imdur 30 mg daily to help with antianginal effects.  2.  In terms of her shortness of breath an echocardiogram will be performed to reassess LV function and valvular abnormalities.  She already does have depressed ejection fraction from her echocardiogram in 2019.  3.  Her blood pressure is above target but addition of the Imdur I am hoping will help her improve her blood pressure as well.  4.  Hyperlipidemia-continue patient on her current statin regimen.  Atorvastatin 40 mg daily.  We will get blood work today with lipid panel, BMP mag and also CBC given history of GI bleed  The patient is in agreement with the above plan. The patient left the office in stable condition.  The patient will follow up in 3 months or sooner if needed.   Medication Adjustments/Labs and Tests Ordered: Current medicines are reviewed at length with the patient today.  Concerns regarding medicines are outlined above.  Orders Placed This Encounter  Procedures  . Basic metabolic panel  . CBC  . Magnesium  . Lipid panel  . MYOCARDIAL PERFUSION IMAGING  . EKG 12-Lead  . ECHOCARDIOGRAM COMPLETE   Meds ordered this encounter  Medications  . isosorbide mononitrate (IMDUR) 30 MG 24 hr tablet    Sig: Take 1 tablet (30 mg total) by mouth daily.    Dispense:  90 tablet    Refill:  3    Patient Instructions  Medication Instructions:  Your physician has recommended you make the following change in your medication:  1.  START Imdur 30 mg taking 1 daily  *If you need a refill on your cardiac medications before your next appointment, please call your pharmacy*   Lab Work: TODAY:  BMET, CBC, MAG, & LIPID  If you have labs (blood work) drawn today and your tests are completely normal, you will receive your results only by: Marland Kitchen MyChart Message (if you have MyChart) OR . A paper copy in the mail If you have any lab test that is abnormal or we need to change your  treatment, we will call you to review the results.   Testing/Procedures: Your physician has requested that you have an echocardiogram. Echocardiography is a painless test that uses sound waves to create images of your heart. It provides your doctor with information about the size and shape of your heart and how well your heart's chambers and valves are working. This procedure takes approximately one hour. There are no restrictions for this procedure.   Your physician has requested that you have a lexiscan myoview. For further information please visit [email or web address] Please follow instruction sheet, as given.    Follow-Up: At [facility], you and your health needs are our priority.  As part of our continuing mission to provide you with exceptional heart care, we have created designated Provider Care Teams.  These Care Teams include your primary Cardiologist (physician) and Advanced Practice Providers (APPs -  Physician Assistants and Nurse Practitioners) who all work together to provide you with the care you need, when you need it.  We recommend signing up for the patient portal called "MyChart".  Sign up information is provided on this After Visit Summary.  MyChart is used to connect with patients for Virtual Visits (Telemedicine).  Patients are able to view lab/test results, encounter notes, upcoming appointments, etc.  Non-urgent messages can be sent to your provider as well.   To learn more about what you can  do with MyChart, go to [email or web address].    Your next appointment:   3 month(s)  The format for your next appointment:   In Person  Provider:   Thomasene Ripple, DO   Other Instructions  Echocardiogram An echocardiogram is a procedure that uses painless sound waves (ultrasound) to produce an image of the heart. Images from an echocardiogram can provide important information about:  Signs of coronary artery disease (CAD).  Aneurysm detection. An aneurysm is a weak  or damaged part of an artery wall that bulges out from the normal force of blood pumping through the body.  Heart size and shape. Changes in the size or shape of the heart can be associated with certain conditions, including heart failure, aneurysm, and CAD.  Heart muscle function.  Heart valve function.  Signs of a past heart attack.  Fluid buildup around the heart.  Thickening of the heart muscle.  A tumor or infectious growth around the heart valves. Tell a health care provider about:  Any allergies you have.  All medicines you are taking, including vitamins, herbs, eye drops, creams, and over-the-counter medicines.  Any blood disorders you have.  Any surgeries you have had.  Any medical conditions you have.  Whether you are pregnant or may be pregnant. What are the risks? Generally, this is a safe procedure. However, problems may occur, including:  Allergic reaction to dye (contrast) that may be used during the procedure. What happens before the procedure? No specific preparation is needed. You may eat and drink normally. What happens during the procedure?   An IV tube may be inserted into one of your veins.  You may receive contrast through this tube. A contrast is an injection that improves the quality of the pictures from your heart.  A gel will be applied to your chest.  A wand-like tool (transducer) will be moved over your chest. The gel will help to transmit the sound waves from the transducer.  The sound waves will harmlessly bounce off of your heart to allow the heart images to be captured in real-time motion. The images will be recorded on a computer. The procedure may vary among health care providers and hospitals. What happens after the procedure?  You may return to your normal, everyday life, including diet, activities, and medicines, unless your health care provider tells you not to do that. Summary  An echocardiogram is a procedure that uses  painless sound waves (ultrasound) to produce an image of the heart.  Images from an echocardiogram can provide important information about the size and shape of your heart, heart muscle function, heart valve function, and fluid buildup around your heart.  You do not need to do anything to prepare before this procedure. You may eat and drink normally.  After the echocardiogram is completed, you may return to your normal, everyday life, unless your health care provider tells you not to do that. This information is not intended to replace advice given to you by your health care provider. Make sure you discuss any questions you have with your health care provider. Document Revised: 02/24/2019 Document Reviewed: 12/06/2016 Elsevier Patient Education  2020 Elsevier Inc.    Cardiac Nuclear Scan A cardiac nuclear scan is a test that is done to check the flow of blood to your heart. It is done when you are resting and when you are exercising. The test looks for problems such as:  Not enough blood reaching a portion of the heart.  The heart muscle  not working as it should. You may need this test if:  You have heart disease.  You have had lab results that are not normal.  You have had heart surgery or a balloon procedure to open up blocked arteries (angioplasty).  You have chest pain.  You have shortness of breath. In this test, a special dye (tracer) is put into your bloodstream. The tracer will travel to your heart. A camera will then take pictures of your heart to see how the tracer moves through your heart. This test is usually done at a hospital and takes 2-4 hours. Tell a doctor about:  Any allergies you have.  All medicines you are taking, including vitamins, herbs, eye drops, creams, and over-the-counter medicines.  Any problems you or family members have had with anesthetic medicines.  Any blood disorders you have.  Any surgeries you have had.  Any medical conditions you  have.  Whether you are pregnant or may be pregnant. What are the risks? Generally, this is a safe test. However, problems may occur, such as:  Serious chest pain and heart attack. This is only a risk if the stress portion of the test is done.  Rapid heartbeat.  A feeling of warmth in your chest. This feeling usually does not last long.  Allergic reaction to the tracer. What happens before the test?  Ask your doctor about changing or stopping your normal medicines. This is important.  Follow instructions from your doctor about what you cannot eat or drink.  Remove your jewelry on the day of the test. What happens during the test?  An IV tube will be inserted into one of your veins.  Your doctor will give you a small amount of tracer through the IV tube.  You will wait for 20-40 minutes while the tracer moves through your bloodstream.  Your heart will be monitored with an electrocardiogram (ECG).  You will lie down on an exam table.  Pictures of your heart will be taken for about 15-20 minutes.  You may also have a stress test. For this test, one of these things may be done: ? You will be asked to exercise on a treadmill or a stationary bike. ? You will be given medicines that will make your heart work harder. This is done if you are unable to exercise.  When blood flow to your heart has peaked, a tracer will again be given through the IV tube.  After 20-40 minutes, you will get back on the exam table. More pictures will be taken of your heart.  Depending on the tracer that is used, more pictures may need to be taken 3-4 hours later.  Your IV tube will be removed when the test is over. The test may vary among doctors and hospitals. What happens after the test?  Ask your doctor: ? Whether you can return to your normal schedule, including diet, activities, and medicines. ? Whether you should drink more fluids. This will help to remove the tracer from your body. Drink  enough fluid to keep your pee (urine) pale yellow.  Ask your doctor, or the department that is doing the test: ? When will my results be ready? ? How will I get my results? Summary  A cardiac nuclear scan is a test that is done to check the flow of blood to your heart.  Tell your doctor whether you are pregnant or may be pregnant.  Before the test, ask your doctor about changing or stopping your normal  medicines. This is important.  Ask your doctor whether you can return to your normal activities. You may be asked to drink more fluids. This information is not intended to replace advice given to you by your health care provider. Make sure you discuss any questions you have with your health care provider. Document Revised: 02/23/2019 Document Reviewed: 04/19/2018 Elsevier Patient Education  [place].      Adopting a Healthy Lifestyle.  Know what a healthy weight is for you (roughly BMI <25) and aim to maintain this   Aim for 7+ servings of fruits and vegetables daily   65-80+ fluid ounces of water or unsweet tea for healthy kidneys   Limit to max 1 drink of alcohol per day; avoid smoking/tobacco   Limit animal fats in diet for cholesterol and heart health - choose grass fed whenever available   Avoid highly processed foods, and foods high in saturated/trans fats   Aim for low stress - take time to unwind and care for your mental health   Aim for 150 min of moderate intensity exercise weekly for heart health, and weights twice weekly for bone health   Aim for 7-9 hours of sleep daily   When it comes to diets, agreement about the perfect plan isnt easy to find, even among the experts. Experts at the [employer] developed an idea known as the Healthy Eating Plate. Just imagine a plate divided into logical, healthy portions.   The emphasis is on diet quality:   Load up on vegetables and fruits - one-half of your plate: Aim for color and variety,  and remember that potatoes dont count.   Go for whole grains - one-quarter of your plate: Whole wheat, barley, wheat berries, quinoa, oats, brown rice, and foods made with them. If you want pasta, go with whole wheat pasta.   Protein power - one-quarter of your plate: Fish, chicken, beans, and nuts are all healthy, versatile protein sources. Limit red meat.   The diet, however, does go beyond the plate, offering a few other suggestions.   Use healthy plant oils, such as olive, canola, soy, corn, sunflower and peanut. Check the labels, and avoid partially hydrogenated oil, which have unhealthy trans fats.   If youre thirsty, drink water. Coffee and tea are good in moderation, but skip sugary drinks and limit milk and dairy products to one or two daily servings.   The type of carbohydrate in the diet is more important than the amount. Some sources of carbohydrates, such as vegetables, fruits, whole grains, and beans-are healthier than others.   Finally, stay active  Signed, Berniece Salines, DO  04/11/2020 11:40 AM    [place]

## 2020-04-11 NOTE — Patient Instructions (Signed)
Medication Instructions:  Your physician has recommended you make the following change in your medication:  1.  START Imdur 30 mg taking 1 daily  *If you need a refill on your cardiac medications before your next appointment, please call your pharmacy*   Lab Work: TODAY:  BMET, CBC, MAG, & LIPID  If you have labs (blood work) drawn today and your tests are completely normal, you will receive your results only by: Marland Kitchen MyChart Message (if you have MyChart) OR . A paper copy in the mail If you have any lab test that is abnormal or we need to change your treatment, we will call you to review the results.   Testing/Procedures: Your physician has requested that you have an echocardiogram. Echocardiography is a painless test that uses sound waves to create images of your heart. It provides your doctor with information about the size and shape of your heart and how well your heart's chambers and valves are working. This procedure takes approximately one hour. There are no restrictions for this procedure.   Your physician has requested that you have a lexiscan myoview. For further information please visit [email or web address] Please follow instruction sheet, as given.    Follow-Up: At [facility], you and your health needs are our priority.  As part of our continuing mission to provide you with exceptional heart care, we have created designated Provider Care Teams.  These Care Teams include your primary Cardiologist (physician) and Advanced Practice Providers (APPs -  Physician Assistants and Nurse Practitioners) who all work together to provide you with the care you need, when you need it.  We recommend signing up for the patient portal called "MyChart".  Sign up information is provided on this After Visit Summary.  MyChart is used to connect with patients for Virtual Visits (Telemedicine).  Patients are able to view lab/test results, encounter notes, upcoming appointments, etc.  Non-urgent  messages can be sent to your provider as well.   To learn more about what you can do with MyChart, go to [email or web address].    Your next appointment:   3 month(s)  The format for your next appointment:   In Person  Provider:   Thomasene Ripple, DO   Other Instructions  Echocardiogram An echocardiogram is a procedure that uses painless sound waves (ultrasound) to produce an image of the heart. Images from an echocardiogram can provide important information about:  Signs of coronary artery disease (CAD).  Aneurysm detection. An aneurysm is a weak or damaged part of an artery wall that bulges out from the normal force of blood pumping through the body.  Heart size and shape. Changes in the size or shape of the heart can be associated with certain conditions, including heart failure, aneurysm, and CAD.  Heart muscle function.  Heart valve function.  Signs of a past heart attack.  Fluid buildup around the heart.  Thickening of the heart muscle.  A tumor or infectious growth around the heart valves. Tell a health care provider about:  Any allergies you have.  All medicines you are taking, including vitamins, herbs, eye drops, creams, and over-the-counter medicines.  Any blood disorders you have.  Any surgeries you have had.  Any medical conditions you have.  Whether you are pregnant or may be pregnant. What are the risks? Generally, this is a safe procedure. However, problems may occur, including:  Allergic reaction to dye (contrast) that may be used during the procedure. What happens before the procedure? No specific preparation is needed.  You may eat and drink normally. What happens during the procedure?   An IV tube may be inserted into one of your veins.  You may receive contrast through this tube. A contrast is an injection that improves the quality of the pictures from your heart.  A gel will be applied to your chest.  A wand-like tool (transducer) will  be moved over your chest. The gel will help to transmit the sound waves from the transducer.  The sound waves will harmlessly bounce off of your heart to allow the heart images to be captured in real-time motion. The images will be recorded on a computer. The procedure may vary among health care providers and hospitals. What happens after the procedure?  You may return to your normal, everyday life, including diet, activities, and medicines, unless your health care provider tells you not to do that. Summary  An echocardiogram is a procedure that uses painless sound waves (ultrasound) to produce an image of the heart.  Images from an echocardiogram can provide important information about the size and shape of your heart, heart muscle function, heart valve function, and fluid buildup around your heart.  You do not need to do anything to prepare before this procedure. You may eat and drink normally.  After the echocardiogram is completed, you may return to your normal, everyday life, unless your health care provider tells you not to do that. This information is not intended to replace advice given to you by your health care provider. Make sure you discuss any questions you have with your health care provider. Document Revised: 02/24/2019 Document Reviewed: 12/06/2016 Elsevier Patient Education  2020 [place].    Cardiac Nuclear Scan A cardiac nuclear scan is a test that is done to check the flow of blood to your heart. It is done when you are resting and when you are exercising. The test looks for problems such as:  Not enough blood reaching a portion of the heart.  The heart muscle not working as it should. You may need this test if:  You have heart disease.  You have had lab results that are not normal.  You have had heart surgery or a balloon procedure to open up blocked arteries (angioplasty).  You have chest pain.  You have shortness of breath. In this test, a special  dye (tracer) is put into your bloodstream. The tracer will travel to your heart. A camera will then take pictures of your heart to see how the tracer moves through your heart. This test is usually done at a hospital and takes 2-4 hours. Tell a doctor about:  Any allergies you have.  All medicines you are taking, including vitamins, herbs, eye drops, creams, and over-the-counter medicines.  Any problems you or family members have had with anesthetic medicines.  Any blood disorders you have.  Any surgeries you have had.  Any medical conditions you have.  Whether you are pregnant or may be pregnant. What are the risks? Generally, this is a safe test. However, problems may occur, such as:  Serious chest pain and heart attack. This is only a risk if the stress portion of the test is done.  Rapid heartbeat.  A feeling of warmth in your chest. This feeling usually does not last long.  Allergic reaction to the tracer. What happens before the test?  Ask your doctor about changing or stopping your normal medicines. This is important.  Follow instructions from your doctor about what you cannot eat  or drink.  Remove your jewelry on the day of the test. What happens during the test?  An IV tube will be inserted into one of your veins.  Your doctor will give you a small amount of tracer through the IV tube.  You will wait for 20-40 minutes while the tracer moves through your bloodstream.  Your heart will be monitored with an electrocardiogram (ECG).  You will lie down on an exam table.  Pictures of your heart will be taken for about 15-20 minutes.  You may also have a stress test. For this test, one of these things may be done: ? You will be asked to exercise on a treadmill or a stationary bike. ? You will be given medicines that will make your heart work harder. This is done if you are unable to exercise.  When blood flow to your heart has peaked, a tracer will again be given  through the IV tube.  After 20-40 minutes, you will get back on the exam table. More pictures will be taken of your heart.  Depending on the tracer that is used, more pictures may need to be taken 3-4 hours later.  Your IV tube will be removed when the test is over. The test may vary among doctors and hospitals. What happens after the test?  Ask your doctor: ? Whether you can return to your normal schedule, including diet, activities, and medicines. ? Whether you should drink more fluids. This will help to remove the tracer from your body. Drink enough fluid to keep your pee (urine) pale yellow.  Ask your doctor, or the department that is doing the test: ? When will my results be ready? ? How will I get my results? Summary  A cardiac nuclear scan is a test that is done to check the flow of blood to your heart.  Tell your doctor whether you are pregnant or may be pregnant.  Before the test, ask your doctor about changing or stopping your normal medicines. This is important.  Ask your doctor whether you can return to your normal activities. You may be asked to drink more fluids. This information is not intended to replace advice given to you by your health care provider. Make sure you discuss any questions you have with your health care provider. Document Revised: 02/23/2019 Document Reviewed: 04/19/2018 Elsevier Patient Education  2020 [employer].

## 2020-04-12 ENCOUNTER — Telehealth: Payer: Self-pay

## 2020-04-12 LAB — BASIC METABOLIC PANEL
BUN/Creatinine Ratio: 21 (ref 12–28)
BUN: 25 mg/dL (ref 8–27)
CO2: 20 mmol/L (ref 20–29)
Calcium: 9.1 mg/dL (ref 8.7–10.3)
Chloride: 109 mmol/L — ABNORMAL HIGH (ref 96–106)
Creatinine, Ser: 1.18 mg/dL — ABNORMAL HIGH (ref 0.57–1.00)
GFR calc Af Amer: 51 mL/min/{1.73_m2} — ABNORMAL LOW (ref >59)
GFR calc non Af Amer: 44 mL/min/{1.73_m2} — ABNORMAL LOW (ref >59)
Glucose: 166 mg/dL — ABNORMAL HIGH (ref 65–99)
Potassium: 4.6 mmol/L (ref 3.5–5.2)
Sodium: 143 mmol/L (ref 134–144)

## 2020-04-12 LAB — LIPID PANEL
Chol/HDL Ratio: 2 ratio (ref 0.0–4.4)
Cholesterol, Total: 115 mg/dL (ref 100–199)
HDL: 57 mg/dL (ref >39)
LDL Chol Calc (NIH): 37 mg/dL (ref 0–99)
Triglycerides: 122 mg/dL (ref 0–149)
VLDL Cholesterol Cal: 21 mg/dL (ref 5–40)

## 2020-04-12 LAB — MAGNESIUM: Magnesium: 2 mg/dL (ref 1.6–2.3)

## 2020-04-12 NOTE — Telephone Encounter (Signed)
-----   Message from Thomasene Ripple, DO sent at 04/12/2020  8:34 AM EDT ----- Stable labs.

## 2020-04-12 NOTE — Telephone Encounter (Signed)
Spoke with patient regarding results.  Patient verbalizes understanding and is agreeable to plan of care. Advised patient to call back with any issues or concerns.  

## 2020-04-18 ENCOUNTER — Telehealth (HOSPITAL_COMMUNITY): Payer: Self-pay

## 2020-04-18 NOTE — Telephone Encounter (Signed)
Encounter complete. 

## 2020-04-20 ENCOUNTER — Ambulatory Visit (HOSPITAL_COMMUNITY)
Admission: RE | Admit: 2020-04-20 | Discharge: 2020-04-20 | Disposition: A | Discharge status: Home / Self Care | Payer: Medicare Other | Source: Ambulatory Visit | Attending: Cardiovascular Disease | Admitting: Cardiovascular Disease

## 2020-04-20 ENCOUNTER — Other Ambulatory Visit: Payer: Self-pay

## 2020-04-20 DIAGNOSIS — R079 Chest pain, unspecified: Secondary | ICD-10-CM | POA: Diagnosis not present

## 2020-04-20 LAB — MYOCARDIAL PERFUSION IMAGING
LV dias vol: 159 mL (ref 46–106)
LV sys vol: 109 mL
Peak HR: 103 {beats}/min
Rest HR: 61 {beats}/min
SDS: 6
SRS: 5
SSS: 11
TID: 0.98

## 2020-04-20 MED ORDER — REGADENOSON 0.4 MG/5ML IV SOLN
0.4000 mg | Freq: Once | INTRAVENOUS | Status: AC
  Administered 2020-04-20: 0.4 mg via INTRAVENOUS

## 2020-04-20 MED ORDER — AMINOPHYLLINE 25 MG/ML IV SOLN
75.0000 mg | Freq: Once | INTRAVENOUS | Status: AC
Start: 2020-04-20 — End: 2020-04-20
  Administered 2020-04-20: 75 mg via INTRAVENOUS

## 2020-04-20 MED ORDER — TECHNETIUM TC 99M TETROFOSMIN IV KIT
10.9000 | PACK | Freq: Once | INTRAVENOUS | Status: AC | PRN
  Administered 2020-04-20: 10.9 via INTRAVENOUS
  Filled 2020-04-20: qty 11

## 2020-04-20 MED ORDER — TECHNETIUM TC 99M TETROFOSMIN IV KIT
31.2000 | PACK | Freq: Once | INTRAVENOUS | Status: AC | PRN
  Administered 2020-04-20: 31.2 via INTRAVENOUS
  Filled 2020-04-20: qty 32

## 2020-04-23 ENCOUNTER — Telehealth: Payer: Self-pay

## 2020-04-23 NOTE — Telephone Encounter (Signed)
-----   Message from Margaret Ripple, DO sent at 04/21/2020 10:51 AM EDT ----- The nuclear study reporting that your heart function in low 30 -44%. I will wait for the echo for more information. For now I want you to avoid strenuous activities until I see you.

## 2020-04-23 NOTE — Telephone Encounter (Signed)
Spoke with patient regarding results and recommendation.  Patient verbalizes understanding and is agreeable to plan of care. Advised patient to call back with any issues or concerns.  

## 2020-04-24 ENCOUNTER — Other Ambulatory Visit: Payer: Self-pay

## 2020-04-24 ENCOUNTER — Encounter: Payer: Self-pay | Admitting: Family Medicine

## 2020-04-24 ENCOUNTER — Ambulatory Visit (INDEPENDENT_AMBULATORY_CARE_PROVIDER_SITE_OTHER): Payer: Medicare Other | Admitting: Family Medicine

## 2020-04-24 VITALS — BP 130/72 | HR 90 | Temp 96.7°F | Ht 62.0 in | Wt 176.1 lb

## 2020-04-24 DIAGNOSIS — M1A9XX0 Chronic gout, unspecified, without tophus (tophi): Secondary | ICD-10-CM

## 2020-04-24 DIAGNOSIS — I5032 Chronic diastolic (congestive) heart failure: Secondary | ICD-10-CM | POA: Diagnosis not present

## 2020-04-24 DIAGNOSIS — J9611 Chronic respiratory failure with hypoxia: Secondary | ICD-10-CM

## 2020-04-24 DIAGNOSIS — E1139 Type 2 diabetes mellitus with other diabetic ophthalmic complication: Secondary | ICD-10-CM

## 2020-04-24 DIAGNOSIS — I5042 Chronic combined systolic (congestive) and diastolic (congestive) heart failure: Secondary | ICD-10-CM

## 2020-04-24 LAB — COMPREHENSIVE METABOLIC PANEL
ALT: 17 U/L (ref 0–35)
AST: 19 U/L (ref 0–37)
Albumin: 3.9 g/dL (ref 3.5–5.2)
Alkaline Phosphatase: 92 U/L (ref 39–117)
BUN: 28 mg/dL — ABNORMAL HIGH (ref 6–23)
CO2: 25 mEq/L (ref 19–32)
Calcium: 8.9 mg/dL (ref 8.4–10.5)
Chloride: 109 mEq/L (ref 96–112)
Creatinine, Ser: 1.15 mg/dL (ref 0.40–1.20)
GFR: 45.47 mL/min — ABNORMAL LOW (ref >60.00)
Glucose, Bld: 161 mg/dL — ABNORMAL HIGH (ref 70–99)
Potassium: 4.3 mEq/L (ref 3.5–5.1)
Sodium: 141 mEq/L (ref 135–145)
Total Bilirubin: 0.7 mg/dL (ref 0.2–1.2)
Total Protein: 6.3 g/dL (ref 6.0–8.3)

## 2020-04-24 LAB — URIC ACID: Uric Acid, Serum: 5.6 mg/dL (ref 2.4–7.0)

## 2020-04-24 LAB — HEMOGLOBIN A1C: Hgb A1c MFr Bld: 8.3 % — ABNORMAL HIGH (ref 4.6–6.5)

## 2020-04-24 NOTE — Progress Notes (Addendum)
Subjective:   Chief Complaint  Patient presents with  . Follow-up    Margaret Ward is a 79 y.o. female here for follow-up of diabetes.  Here w daughter.  [name]'s self monitored glucose range is low-mid 100's.  Patient denies hypoglycemic reactions. She checks her glucose levels 2x/week.  Patient does not require insulin.   Medications include: diet controlled Exercise: Little 2/2 heart condition Diet is healthy overall.   Gout Hx of gout in hands.  Takes allopurinol 100 mg/d and reports compliance. No AE's. No recent flares.   Past Medical History:  Diagnosis Date  . Anemia   . Arthritis    "hands, arms, all over the place" (03/02/2018)  . Bilateral carpal tunnel syndrome 10/11/2019  . Bronchospasm 03/28/2018  . CAD (coronary artery disease) 02/08/2018   Left heart cath 01/13/18: 40% distal LMCAS 90% proximal LAD  LCF with 90% proximal ramus stenosis, diffuse RCA, 60% distal and 90% proximal PDA stenosis  . CAD in native artery    a. s/p CABG in 01/2018. b. Recurrent CP 02/2018 -> cath showing early graft closure SVG-diagonal, received DES to D1.  Marland Kitchen CAD/s/p CABG Mar 2019 w/ DES to vein graft Apr 2019 05/21/2018  . 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 Carpal tunnel syndrome, bilateral 12/28/2018  . Chest pain 02/05/2018  . Chronic combined systolic and diastolic CHF (congestive heart failure) (HCC)    a. LVEF 40-45% by echo 02/2018.  Marland Kitchen Chronic combined systolic and diastolic heart failure (HCC) 05/21/2018  . Chronic diastolic heart failure (HCC) 03/28/2018  . 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 Chronic hypoxemic respiratory failure (HCC) 05/21/2018  . Chronic lower back pain   . CKD (chronic kidney disease), stage III   . Diabetes mellitus type 2 in obese (HCC) 04/23/2018  . GERD (gastroesophageal reflux disease)   . Glass eye    right eye  . Gout    "on daily RX" (03/02/2018)  . Heme positive stool 05/21/2018  . History of kidney stones   . HLD (hyperlipidemia) 05/21/2018  .  Hyperlipidemia   . Hypertension   . Hypertensive heart disease   . 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  . Kidney cysts   . Kidney disease   . Left ventricular aneurysm   . Migraine    "used to get them twice/week; haven't had them in a long time" (03/02/2018)  . NSTEMI (non-ST elevated myocardial infarction) (HCC) 03/01/2018  . On home oxygen therapy    "3L at night and prn during the day" (03/02/2018)  . Pneumonia    used to get it twice/year; stopped after I had hysterectomy" (03/02/2018)  . 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 Sinus tachycardia 03/09/2018  . Sleep apnea   . SOB (shortness of breath) 03/09/2018  . Symptomatic anemia 05/21/2018  . Type II diabetes mellitus (HCC)      Related testing: Date of retinal exam: Due Pneumovax: done  Objective:  BP 130/72 (BP Location: Left Arm, Patient Position: Sitting, Cuff Size: Normal)   Pulse 90   Temp (!) 96.7 F (35.9 C) (Temporal)   Ht 5\' 2"  (1.575 m)   Wt 176 lb 2 oz (79.9 kg)   SpO2 95%   BMI 32.21 kg/m  General:  Well developed, well nourished, in no apparent distress Lungs:  CTAB, no access msc use Cardio:  RRR, no bruits, 2 + pitting b/l LE edema Psych: Age appropriate judgment and insight  Assessment:   Type 2 diabetes mellitus  with other ophthalmic complication, without long-term current use of insulin ([place]) - Plan: Comprehensive metabolic panel, Hemoglobin A1c  Chronic gout without tophus, unspecified cause, unspecified site - Plan: Uric acid  Chronic diastolic heart failure ([place]) - Plan: For home use only DME oxygen  Chronic hypoxemic respiratory failure ([place]) - Plan: For home use only DME oxygen   Plan:   1- Ck A1c. Cont diet controlled approach. Needs eye exam, dealing with cardiac issues and will f/u after this is resolved. 2- Cont allopurinol, ck urate.  Pt requests portable oxygen. She has intermittent chronic resp failure 2/2 CHF. Symptoms include dyspnea and dyspnea on exertion. Her  current oxygen supply is bulky and difficult to move for herself and her elderly husband. She would benefit from a portable supply. SpO2 95% today on RA.  F/u in 6 mo or prn.  The patient and her daughter voiced understanding and agreement to the plan.  [place], DO 04/24/20 8:45 AM

## 2020-04-24 NOTE — Patient Instructions (Addendum)
Schedule an eye exam when convenient.  Give [place] 2-3 business days to get the results of your labs back.   Keep the diet clean and stay as active as your heart doctor allows.  Drop off the form for the handicap placard at your convenience.   Let us know if you need anything.

## 2020-04-27 ENCOUNTER — Telehealth: Payer: Self-pay | Admitting: Family Medicine

## 2020-04-27 ENCOUNTER — Other Ambulatory Visit: Payer: Self-pay | Admitting: Family Medicine

## 2020-04-27 DIAGNOSIS — I5032 Chronic diastolic (congestive) heart failure: Secondary | ICD-10-CM

## 2020-04-27 DIAGNOSIS — J9611 Chronic respiratory failure with hypoxia: Secondary | ICD-10-CM

## 2020-04-27 NOTE — Telephone Encounter (Signed)
[facility] called to clarify request for portable oxygen. The patient has traditional medicare and they have several hard stops. 1--documented in the office that walking around the office without oxygen it drops to 88 and then goes up after putting oxygen back on. 2.  Any other medications have been used for the diagnosis used for the need of oxygen. ALSO they did ask if she has oxygen already where does it come from. I call the patient and she states she has had the same tank for over 7 years and that it just recycles itself. If we need to have her come back in let me know and can schedule.  Due to her insurance and certain requirements not documented she will not get approved

## 2020-04-27 NOTE — Progress Notes (Signed)
DM

## 2020-04-27 NOTE — Progress Notes (Signed)
DME 

## 2020-04-28 NOTE — Telephone Encounter (Signed)
Schedule a nurse visit to have her ambulate w O2 monitoring. Otherwise I would recommend she contact whomever she received her O2 from before. Ty.

## 2020-04-30 ENCOUNTER — Telehealth: Payer: Self-pay | Admitting: Family Medicine

## 2020-04-30 NOTE — Telephone Encounter (Signed)
I have called her daughter and what she has is not an oxygen tank ,but an oxygen concentrator and helps her to breathe easier and helps her breath easier.  She said there is a tube in the front that measures the units of air.  It has a filter that gets cleaned. Advise should she just schedule an appointment to evaluate

## 2020-05-02 ENCOUNTER — Ambulatory Visit (HOSPITAL_COMMUNITY)
Admission: RE | Admit: 2020-05-02 | Discharge: 2020-05-02 | Disposition: A | Discharge status: Home / Self Care | Payer: Medicare Other | Source: Ambulatory Visit | Attending: Cardiology | Admitting: Cardiology

## 2020-05-02 ENCOUNTER — Other Ambulatory Visit: Payer: Self-pay

## 2020-05-02 ENCOUNTER — Ambulatory Visit (HOSPITAL_BASED_OUTPATIENT_CLINIC_OR_DEPARTMENT_OTHER): Payer: Medicare Other

## 2020-05-02 DIAGNOSIS — K219 Gastro-esophageal reflux disease without esophagitis: Secondary | ICD-10-CM | POA: Insufficient documentation

## 2020-05-02 DIAGNOSIS — E1122 Type 2 diabetes mellitus with diabetic chronic kidney disease: Secondary | ICD-10-CM | POA: Insufficient documentation

## 2020-05-02 DIAGNOSIS — I130 Hypertensive heart and chronic kidney disease with heart failure and stage 1 through stage 4 chronic kidney disease, or unspecified chronic kidney disease: Secondary | ICD-10-CM | POA: Diagnosis not present

## 2020-05-02 DIAGNOSIS — R079 Chest pain, unspecified: Secondary | ICD-10-CM | POA: Diagnosis not present

## 2020-05-02 DIAGNOSIS — I252 Old myocardial infarction: Secondary | ICD-10-CM | POA: Insufficient documentation

## 2020-05-02 DIAGNOSIS — I509 Heart failure, unspecified: Secondary | ICD-10-CM | POA: Diagnosis not present

## 2020-05-02 DIAGNOSIS — G4730 Sleep apnea, unspecified: Secondary | ICD-10-CM | POA: Diagnosis not present

## 2020-05-02 DIAGNOSIS — N189 Chronic kidney disease, unspecified: Secondary | ICD-10-CM | POA: Insufficient documentation

## 2020-05-02 DIAGNOSIS — E785 Hyperlipidemia, unspecified: Secondary | ICD-10-CM | POA: Insufficient documentation

## 2020-05-02 DIAGNOSIS — R0602 Shortness of breath: Secondary | ICD-10-CM | POA: Insufficient documentation

## 2020-05-02 NOTE — Progress Notes (Signed)
  Echocardiogram 2D Echocardiogram has been performed.  Margaret Ward Margaret Ward 05/02/2020, 10:18 AM

## 2020-05-03 ENCOUNTER — Telehealth: Payer: Self-pay

## 2020-05-03 ENCOUNTER — Other Ambulatory Visit: Payer: Self-pay | Admitting: Family

## 2020-05-03 DIAGNOSIS — I110 Hypertensive heart disease with heart failure: Secondary | ICD-10-CM

## 2020-05-03 DIAGNOSIS — I5032 Chronic diastolic (congestive) heart failure: Secondary | ICD-10-CM

## 2020-05-03 MED ORDER — CARVEDILOL 12.5 MG PO TABS
12.5000 mg | ORAL_TABLET | Freq: Two times a day (BID) | ORAL | 3 refills | Status: DC
Start: 2020-05-03 — End: 2021-04-03

## 2020-05-03 NOTE — Telephone Encounter (Signed)
Spoke with patient regarding results and recommendation. We scheduled an appointment together for her to see Dr. Servando Salina on 06/06/20 as this was the soonest appointment Dr. Servando Salina had available in [facility].   Patient verbalizes understanding and is agreeable to plan of care. Advised patient to call back with any issues or concerns.

## 2020-05-03 NOTE — Telephone Encounter (Signed)
-----   Message from Alver Sorrow, NP sent at 05/03/2020  9:24 AM EDT ----- Echocardiogram shows pump function has reduced compared to previous. Now 25-30%. Mild stiffening of the heart which is stable compared to previous. No significant valvular abnormalities.   We will need to begin optimizing her heart failure therapies. Stop Metoprolol. Start Coreg 12.5mg  twice daily.   Of note, please try to get her in earlier with Dr. Servando Salina.

## 2020-06-06 ENCOUNTER — Ambulatory Visit (INDEPENDENT_AMBULATORY_CARE_PROVIDER_SITE_OTHER): Payer: Medicare Other | Admitting: Cardiology

## 2020-06-06 ENCOUNTER — Other Ambulatory Visit: Payer: Self-pay

## 2020-06-06 ENCOUNTER — Encounter: Payer: Self-pay | Admitting: Cardiology

## 2020-06-06 VITALS — BP 130/70 | HR 76 | Ht 62.0 in | Wt 178.0 lb

## 2020-06-06 DIAGNOSIS — I110 Hypertensive heart disease with heart failure: Secondary | ICD-10-CM

## 2020-06-06 DIAGNOSIS — Z951 Presence of aortocoronary bypass graft: Secondary | ICD-10-CM

## 2020-06-06 DIAGNOSIS — I2510 Atherosclerotic heart disease of native coronary artery without angina pectoris: Secondary | ICD-10-CM

## 2020-06-06 DIAGNOSIS — E1169 Type 2 diabetes mellitus with other specified complication: Secondary | ICD-10-CM | POA: Diagnosis not present

## 2020-06-06 DIAGNOSIS — E669 Obesity, unspecified: Secondary | ICD-10-CM

## 2020-06-06 DIAGNOSIS — N183 Chronic kidney disease, stage 3 unspecified: Secondary | ICD-10-CM

## 2020-06-06 MED ORDER — ENTRESTO 24-26 MG PO TABS: 1.0000 | ORAL_TABLET | Freq: Two times a day (BID) | ORAL | 3 refills | Status: DC

## 2020-06-06 NOTE — H&P (View-Only) (Signed)
Cardiology Office Note:    Date:  06/06/2020   ID:  Margaret Ward, DOB 04-Apr-1941, MRN [id number]  PCP:  Sharlene Dory, DO  Cardiologist:  Thomasene Ripple, DO  Electrophysiologist:  None   Referring MD: Sharlene Dory*   Chief Complaint  Patient presents with  . Follow-up    History of Present Illness:    Margaret Ward is a 79 y.o. female with ahx ofCAD, CABGin March 2019 and subsequentNonSTEMI 03/01/2018 with PCI and stent heart failure CKD and acute GI bleed with gastric ulcer- leading to transfusion of 5 units of PRBC's and her dual antiplatelet therapy was discontinued with recurrent bleeding while hospitalized.She waslast seen 12/28/2018. 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  I saw the patient on Apr 11, 2020 at that time she reported that she was experiencing significant chest pain or shortness of breath.  At the conclusion of visit I started the patient on long-acting nitrates with Imdur 30 mg daily.  Also request that she get a nuclear stress test as well as an echocardiogram.  In the interim the patient was able to get the testing done.  Her nuclear did show decreased EF as well as echo for her EF.  Her nuclear scan was not definitive for an ischemia.  Today she is here for follow-up visit she tells me that she is experiencing more chest pain than prior, and she is concerned about this.    Past Medical History:  Diagnosis Date  . Anemia   . Arthritis    "hands, arms, all over the place" (03/02/2018)  . Bilateral carpal tunnel syndrome 10/11/2019  . Bronchospasm 03/28/2018  . CAD (coronary artery disease) 02/08/2018   Left heart cath 01/13/18: 40% distal LMCAS 90% proximal LAD  LCF with 90% proximal ramus stenosis, diffuse RCA, 60% distal and 90% proximal PDA stenosis  . CAD in native artery    a. s/p CABG in 01/2018. b. Recurrent CP 02/2018 -> cath showing early graft closure  SVG-diagonal, received DES to D1.  Marland Kitchen CAD/s/p CABG Mar 2019 w/ DES to vein graft Apr 2019 05/21/2018  . 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 Carpal tunnel syndrome, bilateral 12/28/2018  . Chest pain 02/05/2018  . Chronic combined systolic and diastolic CHF (congestive heart failure) (HCC)    a. LVEF 40-45% by echo 02/2018.  Marland Kitchen Chronic combined systolic and diastolic heart failure (HCC) 05/21/2018  . Chronic diastolic heart failure (HCC) 03/28/2018  . 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 Chronic hypoxemic respiratory failure (HCC) 05/21/2018  . Chronic lower back pain   . CKD (chronic kidney disease), stage III   . Diabetes mellitus type 2 in obese (HCC) 04/23/2018  . GERD (gastroesophageal reflux disease)   . Glass eye    right eye  . Gout    "on daily RX" (03/02/2018)  . Heme positive stool 05/21/2018  . History of kidney stones   . HLD (hyperlipidemia) 05/21/2018  . Hyperlipidemia   . Hypertension   . Hypertensive heart disease   . 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  . Kidney cysts   . Kidney disease   . Left ventricular aneurysm   . Migraine    "used to get them twice/week; haven't had them in a long time" (03/02/2018)  . NSTEMI (non-ST elevated myocardial infarction) (HCC) 03/01/2018  . On home oxygen therapy    "3L at night and prn during the day" (03/02/2018)  . Pneumonia  used to get it twice/year; stopped after I had hysterectomy" (03/02/2018)  . 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 Sinus tachycardia 03/09/2018  . Sleep apnea   . SOB (shortness of breath) 03/09/2018  . Symptomatic anemia 05/21/2018  . Type II diabetes mellitus (HCC)     Past Surgical History:  Procedure Laterality Date  . BACK SURGERY    . BIOPSY  05/22/2018   Procedure: BIOPSY;  Surgeon: Kathi Der, MD;  Location: [facility];  Service: Gastroenterology;;  . CARDIAC CATHETERIZATION  12/2017   "before OHS"  . CHOLECYSTECTOMY OPEN  1982  . CORONARY ARTERY BYPASS GRAFT  01/19/2018   "CABG  X3; in [place]"  . CORONARY STENT INTERVENTION N/A 03/02/2018   Procedure: CORONARY STENT INTERVENTION;  Surgeon: Kathleene Hazel, MD;  Location: [facility];  Service: Cardiovascular;  Laterality: N/A;  . DILATION AND CURETTAGE OF UTERUS    . ESOPHAGOGASTRODUODENOSCOPY (EGD) WITH PROPOFOL N/A 05/22/2018   Procedure: ESOPHAGOGASTRODUODENOSCOPY (EGD) WITH PROPOFOL;  Surgeon: Kathi Der, MD;  Location: [facility];  Service: Gastroenterology;  Laterality: N/A;  . ESOPHAGOGASTRODUODENOSCOPY (EGD) WITH PROPOFOL N/A 05/24/2018   Procedure: ESOPHAGOGASTRODUODENOSCOPY (EGD) WITH PROPOFOL;  Surgeon: Kathi Der, MD;  Location: [facility];  Service: Gastroenterology;  Laterality: N/A;  . 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 KNEE ARTHROSCOPY Right X 4  . LEFT HEART CATH AND CORS/GRAFTS ANGIOGRAPHY N/A 03/02/2018   Procedure: LEFT HEART CATH AND CORS/GRAFTS ANGIOGRAPHY;  Surgeon: Kathleene Hazel, MD;  [facility];  Service: Cardiovascular;  Laterality: N/A;  . LUMBAR DISC SURGERY    . REDUCTION MAMMAPLASTY Bilateral X 2  . SCHLEROTHERAPY  05/24/2018   Procedure: Theresia Majors;  Surgeon: Kathi Der, MD;  Location: [facility];  Service: Gastroenterology;;  . TONSILLECTOMY  1953  . VAGINAL HYSTERECTOMY  1970    Current Medications: Current Meds  Medication Sig  . acetaminophen (TYLENOL) 500 MG tablet Take 500 mg by mouth every 6 (six) hours as needed for headache.  . allopurinol (ZYLOPRIM) 100 MG tablet TAKE 1 TABLET(100 MG) BY MOUTH DAILY  . atorvastatin (LIPITOR) 40 MG tablet TAKE 1 TABLET(40 MG) BY MOUTH EVERY EVENING  . bimatoprost (LUMIGAN) 0.03 % ophthalmic solution Place 1 drop into the left eye at bedtime.  . carvedilol (COREG) 12.5 MG tablet Take 1 tablet (12.5 mg total) by mouth 2 (two) times daily.  . clopidogrel (PLAVIX) 75 MG tablet TAKE 1 TABLET(75 MG) BY MOUTH DAILY  . furosemide (LASIX) 40 MG tablet TAKE 1 TABLET  BY MOUTH DAILY. TAKE 1 ADDITIONAL TABLET ON MONDAY, WEDNESDAY, AND FRIDAY  . isosorbide mononitrate (IMDUR) 30 MG 24 hr tablet Take 1 tablet (30 mg total) by mouth daily.  . nitroGLYCERIN (NITROSTAT) 0.4 MG SL tablet Place 1 tablet (0.4 mg total) under the tongue every 5 (five) minutes x 3 doses as needed for chest pain.  . pantoprazole (PROTONIX) 20 MG tablet Take 20 mg by mouth daily.  . potassium chloride (KLOR-CON) 10 MEQ tablet TAKE 2 TABLETS BY MOUTH WITH EVERY DOSE OF FUROSEMIDE  . vitamin C (ASCORBIC ACID) 500 MG tablet Take 500 mg by mouth 2 (two) times daily.     Allergies:   Methylprednisolone, Keflex [cephalexin], Ketorolac, Morphine and related, Novocain [procaine], and Septra [sulfamethoxazole-trimethoprim]   Social History   Socioeconomic History  . Marital status: Married    Spouse name: Not on file  . Number of children: Not on file  . Years of  education: Not on file  . Highest education level: Not on file  Occupational History  . Not on file  Tobacco Use  . Smoking status: Former Smoker    Packs/day: 0.12    Years: 3.00    Pack years: 0.36    Types: Cigarettes    Quit date: 1963    Years since quitting: 58.5  . Smokeless tobacco: Never Used  Vaping Use  . Vaping Use: Never used  Substance and Sexual Activity  . Alcohol use: Not Currently  . Drug use: Never  . Sexual activity: Not on file  Other Topics Concern  . Not on file  Social History Narrative  . Not on file   Social Determinants of Health   Financial Resource Strain:   . Difficulty of Paying Living Expenses:   Food Insecurity:   . Worried About Programme researcher, broadcasting/film/video in the Last Year:   . Barista in the Last Year:   Transportation Needs:   . Freight forwarder (Medical):   Marland Kitchen Lack of Transportation (Non-Medical):   Physical Activity:   . Days of Exercise per Week:   . Minutes of Exercise per Session:   Stress:   . Feeling of Stress :   Social Connections:   . Frequency of  Communication with Friends and Family:   . Frequency of Social Gatherings with Friends and Family:   . Attends Religious Services:   . Active Member of Clubs or Organizations:   . Attends Banker Meetings:   Marland Kitchen Marital Status:      Family History: The patient's family history includes Diabetes in her father; Heart Problems in her mother; Heart disease in her brother; Hypertension in her brother, daughter, daughter, daughter, and daughter; Kidney disease in her brother and sister.  ROS:   Review of Systems  Constitution: Negative for decreased appetite, fever and weight gain.  HENT: Negative for congestion, ear discharge, hoarse voice and sore throat.   Eyes: Negative for discharge, redness, vision loss in right eye and visual halos.  Cardiovascular: Negative for chest pain, dyspnea on exertion, leg swelling, orthopnea and palpitations.  Respiratory: Negative for cough, hemoptysis, shortness of breath and snoring.   Endocrine: Negative for heat intolerance and polyphagia.  Hematologic/Lymphatic: Negative for bleeding problem. Does not bruise/bleed easily.  Skin: Negative for flushing, nail changes, rash and suspicious lesions.  Musculoskeletal: Negative for arthritis, joint pain, muscle cramps, myalgias, neck pain and stiffness.  Gastrointestinal: Negative for abdominal pain, bowel incontinence, diarrhea and excessive appetite.  Genitourinary: Negative for decreased libido, genital sores and incomplete emptying.  Neurological: Negative for brief paralysis, focal weakness, headaches and loss of balance.  Psychiatric/Behavioral: Negative for altered mental status, depression and suicidal ideas.  Allergic/Immunologic: Negative for HIV exposure and persistent infections.    EKGs/Labs/Other Studies Reviewed:    The following studies were reviewed today:   EKG:  The ekg ordered today demonstrates   Left ventricle: The cavity size was normal. There was mild concentric  hypertrophy. Systolic function was mildly to moderately reduced. The estimated ejection fraction was in the range of 40% to 45%. Mild diffuse hypokinesis. There was an increased relative contribution of atrial contraction to ventricular filling. Doppler parameters are consistent with abnormal left ventricular relaxation (grade 1 diastolic dysfunction). - Mitral valve: Calcified annulus. There was mild regurgitation. - Left atrium: The atrium was moderately to severely dilated. - Right atrium: The atrium was moderately dilated.   Recent Labs: 04/11/2020: Magnesium 2.0 04/24/2020:  ALT 17; BUN 28; Creatinine, Ser 1.15; Potassium 4.3; Sodium 141  Recent Lipid Panel    Component Value Date/Time   CHOL 115 04/11/2020 0853   TRIG 122 04/11/2020 0853   HDL 57 04/11/2020 0853   CHOLHDL 2.0 04/11/2020 0853   CHOLHDL 2 08/26/2018 0920   VLDL 18.6 08/26/2018 0920   LDLCALC 37 04/11/2020 0853   LDLDIRECT 30 09/08/2019 1512    Physical Exam:    VS:  BP 130/70 (BP Location: Left Arm, Patient Position: Sitting, Cuff Size: Normal)   Pulse 76   Ht 5\' 2"  (1.575 m)   Wt 178 lb (80.7 kg)   SpO2 96%   BMI 32.56 kg/m     Wt Readings from Last 3 Encounters:  06/06/20 178 lb (80.7 kg)  04/24/20 176 lb 2 oz (79.9 kg)  04/20/20 174 lb (78.9 kg)     GEN: Well nourished, well developed in no acute distress HEENT: Normal NECK: No JVD; No carotid bruits LYMPHATICS: No lymphadenopathy CARDIAC: S1S2 noted,RRR, no murmurs, rubs, gallops RESPIRATORY:  Clear to auscultation without rales, wheezing or rhonchi  ABDOMEN: Soft, non-tender, non-distended, +bowel sounds, no guarding. EXTREMITIES: No edema, No cyanosis, no clubbing MUSCULOSKELETAL:  No deformity  SKIN: Warm and dry NEUROLOGIC:  Alert and oriented x 3, non-focal PSYCHIATRIC:  Normal affect, good insight  ASSESSMENT:    1. Coronary artery disease involving native heart, angina presence unspecified, unspecified vessel or  lesion type   2. Hypertensive heart disease with heart failure (HCC)   3. Stage 3 chronic kidney disease, unspecified whether stage 3a or 3b CKD   4. Diabetes mellitus type 2 in obese (HCC)   5. S/P CABG x 3    PLAN:     Given her recurrent chest pain and her high risk for progression of coronary artery disease I like to proceed with a left heart catheterization in this patient given her symptoms this by her medical therapy.  I did speak with the patient and her daughter, I explained to them that she may need hydration prior to her calf given the fact that she does have chronic kidney disease.  The patient understands that risks include but are not limited to stroke (1 in 1000), death (1 in 1000), kidney failure [usually temporary] (1 in 500), bleeding (1 in 200), allergic reaction [possibly serious] (1 in 200), and agrees to proceed.  In the meantime her EF has dropped from her echocardiogram which was done in April 2019 from 40 to 45% to now 5 to 30%.  We have switched her to carvedilol 12.5 mg twice a day.  I like to start the patient on Entresto 24-26 milligrams twice a day.  But given her kidney disease I am going to wait till after her left heart catheterization 2 days after she can start her Entresto.  We will continue to monitor her kidney function.  The patient is in agreement with the above plan. The patient left the office in stable condition.  The patient will follow up in 1 month or sooner if needed.   Medication Adjustments/Labs and Tests Ordered: Current medicines are reviewed at length with the patient today.  Concerns regarding medicines are outlined above.  Orders Placed This Encounter  Procedures  . Basic metabolic panel  . CBC with Differential/Platelet   Meds ordered this encounter  Medications  . sacubitril-valsartan (ENTRESTO) 24-26 MG    Sig: Take 1 tablet by mouth 2 (two) times daily.    Dispense:  60  tablet    Refill:  3    Do not start until 06/16/20 after  your cath.    Patient Instructions  Medication Instructions:  Your physician has recommended you make the following change in your medication:   Start Entresto 24-26 mg 2 days after your cath.  *If you need a refill on your cardiac medications before your next appointment, please call your pharmacy*   Lab Work: Your physician recommends that you have a CBC and BMET today in the office for your upcoming cath.  If you have labs (blood work) drawn today and your tests are completely normal, you will receive your results only by: Marland Kitchen MyChart Message (if you have MyChart) OR . A paper copy in the mail If you have any lab test that is abnormal or we need to change your treatment, we will call you to review the results.   Testing/Procedures:    [facility] [place] [address], [address] [place] [address] Dept: [phone] Loc: [phone]  Corey Caulfield  06/06/2020  You are scheduled for a Cardiac Catheterization on Wednesday, July 28 with Dr. Verne Carrow.  1. Please arrive at the [facility] (Main Entrance A) at [facility]: [address] [place], [place] [address] at 11:00 AM (This time is two hours before your procedure to ensure your preparation). Free valet parking service is available.   Special note: Every effort is made to have your procedure done on time. Please understand that emergencies sometimes delay scheduled procedures.  2. Diet: Do not eat solid foods after midnight.  The patient may have clear liquids until 5am upon the day of the procedure.  3. Labs: You had your labs done in the office.  4. Medication instructions in preparation for your procedure:   Contrast Allergy: No  On the morning of your procedure, take your Plavix/Clopidogrel and any morning medicines NOT listed above.  You may use sips of water.  5. Plan for one night stay--bring personal  belongings. 6. Bring a current list of your medications and current insurance cards. 7. You MUST have a responsible person to drive you home. 8. Someone MUST be with you the first 24 hours after you arrive home or your discharge will be delayed. 9. Please wear clothes that are easy to get on and off and wear slip-on shoes.  Thank you for allowing [place] to care for you!   -- [facility]    Follow-Up: At [facility], you and your health needs are our priority.  As part of our continuing mission to provide you with exceptional heart care, we have created designated Provider Care Teams.  These Care Teams include your primary Cardiologist (physician) and Advanced Practice Providers (APPs -  Physician Assistants and Nurse Practitioners) who all work together to provide you with the care you need, when you need it.  We recommend signing up for the patient portal called "MyChart".  Sign up information is provided on this After Visit Summary.  MyChart is used to connect with patients for Virtual Visits (Telemedicine).  Patients are able to view lab/test results, encounter notes, upcoming appointments, etc.  Non-urgent messages can be sent to your provider as well.   To learn more about what you can do with MyChart, go to [email or web address].    Your next appointment:   1 month(s)  The format for your next appointment:   In Person  Provider:  Thomasene Ripple, DO   Other Instructions Sacubitril; Valsartan Oral Tablets What is this medicine? SACUBITRIL; VALSARTAN (sak UE bi tril; val SAR tan) is a combination of a neprilysin inhibitor and a an angiotensin II receptor blocker. It treats heart failure. This medicine may be used for other purposes; ask your health care provider or pharmacist if you have questions. COMMON BRAND NAME(S): Entresto What should I tell my health care provider before I take this medicine? They need to know if you have any of these  conditions:  diabetes and take a medicine that contains aliskiren  kidney disease  liver disease  an unusual or allergic reaction to sacubitril; valsartan, drugs called angiotensin converting enzyme (ACE) inhibitors, angiotensin II receptor blockers (ARBs), other medicines, foods, dyes, or preservatives  pregnant or trying to get pregnant  breast-feeding How should I use this medicine? Take this drug by mouth. Take it as directed on the prescription label at the same time every day. You can take it with or without food. If it upsets your stomach, take it with food. Keep taking it unless your health care provider tells you to stop. Talk to your health care provider about the use of this drug in children. While it may be prescribed for children as young as 1 for selected conditions, precautions do apply. Overdosage: If you think you have taken too much of this medicine contact a poison control center or emergency room at once. NOTE: This medicine is only for you. Do not share this medicine with others. What if I miss a dose? If you miss a dose, take it as soon as you can. If it is almost time for your next dose, take only that dose. Do not take double or extra doses. What may interact with this medicine? Do not take this medicine with any of the following medicines:  aliskiren if you have diabetes  angiotensin-converting enzyme (ACE) inhibitors, like benazepril, captopril, enalapril, fosinopril, lisinopril, or ramipril This medicine may also interact with the following medicines:  angiotensin II receptor blockers (ARBs) like azilsartan, candesartan, eprosartan, irbesartan, losartan, olmesartan, telmisartan, or valsartan  lithium  NSAIDS, medicines for pain and inflammation, like ibuprofen or naproxen  potassium-sparing diuretics like amiloride, spironolactone, and triamterene  potassium supplements This list may not describe all possible interactions. Give your health care provider  a list of all the medicines, herbs, non-prescription drugs, or dietary supplements you use. Also tell them if you smoke, drink alcohol, or use illegal drugs. Some items may interact with your medicine. What should I watch for while using this medicine? Tell your doctor or healthcare professional if your symptoms do not start to get better or if they get worse. Do not become pregnant while taking this medicine. Women should inform their doctor if they wish to become pregnant or think they might be pregnant. There is a potential for serious side effects to an unborn child. Talk to your health care professional or pharmacist for more information. You may get dizzy. Do not drive, use machinery, or do anything that needs mental alertness until you know how this medicine affects you. Do not stand or sit up quickly, especially if you are an older patient. This reduces the risk of dizzy or fainting spells. Avoid alcoholic drinks; they can make you more dizzy. What side effects may I notice from receiving this medicine? Side effects that you should report to your doctor or health care professional as soon as possible:  allergic reactions like skin rash, itching or  hives, swelling of the face, lips, or tongue  signs and symptoms of increased potassium like muscle weakness; chest pain; or fast, irregular heartbeat  signs and symptoms of kidney injury like trouble passing urine or change in the amount of urine  signs and symptoms of low blood pressure like feeling dizzy or lightheaded, or if you develop extreme fatigue Side effects that usually do not require medical attention (report to your doctor or health care professional if they continue or are bothersome):  cough This list may not describe all possible side effects. Call your doctor for medical advice about side effects. You may report side effects to FDA at 1-800-FDA-1088. Where should I keep my medicine? Keep out of the reach of children and  pets. Store at room temperature between 20 and 25 degrees C (68 and 77 degrees F). Protect from moisture. Keep the container tightly closed. Throw away any unused drug after the expiration date. NOTE: This sheet is a summary. It may not cover all possible information. If you have questions about this medicine, talk to your doctor, pharmacist, or health care provider.  2020 Elsevier/Gold Standard (2019-06-08 16:03:07)      Adopting a Healthy Lifestyle.  Know what a healthy weight is for you (roughly BMI <25) and aim to maintain this   Aim for 7+ servings of fruits and vegetables daily   65-80+ fluid ounces of water or unsweet tea for healthy kidneys   Limit to max 1 drink of alcohol per day; avoid smoking/tobacco   Limit animal fats in diet for cholesterol and heart health - choose grass fed whenever available   Avoid highly processed foods, and foods high in saturated/trans fats   Aim for low stress - take time to unwind and care for your mental health   Aim for 150 min of moderate intensity exercise weekly for heart health, and weights twice weekly for bone health   Aim for 7-9 hours of sleep daily   When it comes to diets, agreement about the perfect plan isnt easy to find, even among the experts. Experts at the [facility] [employer] developed an idea known as the Healthy Eating Plate. Just imagine a plate divided into logical, healthy portions.   The emphasis is on diet quality:   Load up on vegetables and fruits - one-half of your plate: Aim for color and variety, and remember that potatoes dont count.   Go for whole grains - one-quarter of your plate: Whole wheat, barley, wheat berries, quinoa, oats, brown rice, and foods made with them. If you want pasta, go with whole wheat pasta.   Protein power - one-quarter of your plate: Fish, chicken, beans, and nuts are all healthy, versatile protein sources. Limit red meat.   The diet, however, does go beyond the  plate, offering a few other suggestions.   Use healthy plant oils, such as olive, canola, soy, corn, sunflower and peanut. Check the labels, and avoid partially hydrogenated oil, which have unhealthy trans fats.   If youre thirsty, drink water. Coffee and tea are good in moderation, but skip sugary drinks and limit milk and dairy products to one or two daily servings.   The type of carbohydrate in the diet is more important than the amount. Some sources of carbohydrates, such as vegetables, fruits, whole grains, and beans-are healthier than others.   Finally, stay active  Signed, Thomasene Ripple, DO  06/06/2020 11:23 AM    [facility]

## 2020-06-06 NOTE — Patient Instructions (Addendum)
Medication Instructions:  Your physician has recommended you make the following change in your medication:   Start Entresto 24-26 mg 2 days after your cath.  *If you need a refill on your cardiac medications before your next appointment, please call your pharmacy*   Lab Work: Your physician recommends that you have a CBC and BMET today in the office for your upcoming cath.  If you have labs (blood work) drawn today and your tests are completely normal, you will receive your results only by: Marland Kitchen MyChart Message (if you have MyChart) OR . A paper copy in the mail If you have any lab test that is abnormal or we need to change your treatment, we will call you to review the results.   Testing/Procedures:    [facility] CARDIOVASCULAR DIVISION [facility] [place] [address], [address] [place] [address] Dept: [phone] Loc: [phone]  Margaret Ward  06/06/2020  You are scheduled for a Cardiac Catheterization on Wednesday, July 28 with Dr. Verne Carrow.  1. Please arrive at the [facility] (Main Entrance A) at [facility]: [address] [place], [place] [address] at 11:00 AM (This time is two hours before your procedure to ensure your preparation). Free valet parking service is available.   Special note: Every effort is made to have your procedure done on time. Please understand that emergencies sometimes delay scheduled procedures.  2. Diet: Do not eat solid foods after midnight.  The patient may have clear liquids until 5am upon the day of the procedure.  3. Labs: You had your labs done in the office.  4. Medication instructions in preparation for your procedure:   Contrast Allergy: No  On the morning of your procedure, take your Plavix/Clopidogrel and any morning medicines NOT listed above.  You may use sips of water.  5. Plan for one night stay--bring personal belongings. 6. Bring a current list of your  medications and current insurance cards. 7. You MUST have a responsible person to drive you home. 8. Someone MUST be with you the first 24 hours after you arrive home or your discharge will be delayed. 9. Please wear clothes that are easy to get on and off and wear slip-on shoes.  Thank you for allowing [place] to care for you!   -- [facility]    Follow-Up: At [facility], you and your health needs are our priority.  As part of our continuing mission to provide you with exceptional heart care, we have created designated Provider Care Teams.  These Care Teams include your primary Cardiologist (physician) and Advanced Practice Providers (APPs -  Physician Assistants and Nurse Practitioners) who all work together to provide you with the care you need, when you need it.  We recommend signing up for the patient portal called "MyChart".  Sign up information is provided on this After Visit Summary.  MyChart is used to connect with patients for Virtual Visits (Telemedicine).  Patients are able to view lab/test results, encounter notes, upcoming appointments, etc.  Non-urgent messages can be sent to your provider as well.   To learn more about what you can do with MyChart, go to [email or web address].    Your next appointment:   1 month(s)  The format for your next appointment:   In Person  Provider:   Thomasene Ripple, DO   Other Instructions Sacubitril; Valsartan Oral Tablets What is this medicine? SACUBITRIL; VALSARTAN (sak UE bi tril; val SAR  tan) is a combination of a neprilysin inhibitor and a an angiotensin II receptor blocker. It treats heart failure. This medicine may be used for other purposes; ask your health care provider or pharmacist if you have questions. COMMON BRAND NAME(S): Entresto What should I tell my health care provider before I take this medicine? They need to know if you have any of these conditions:  diabetes and take a medicine that  contains aliskiren  kidney disease  liver disease  an unusual or allergic reaction to sacubitril; valsartan, drugs called angiotensin converting enzyme (ACE) inhibitors, angiotensin II receptor blockers (ARBs), other medicines, foods, dyes, or preservatives  pregnant or trying to get pregnant  breast-feeding How should I use this medicine? Take this drug by mouth. Take it as directed on the prescription label at the same time every day. You can take it with or without food. If it upsets your stomach, take it with food. Keep taking it unless your health care provider tells you to stop. Talk to your health care provider about the use of this drug in children. While it may be prescribed for children as young as 1 for selected conditions, precautions do apply. Overdosage: If you think you have taken too much of this medicine contact a poison control center or emergency room at once. NOTE: This medicine is only for you. Do not share this medicine with others. What if I miss a dose? If you miss a dose, take it as soon as you can. If it is almost time for your next dose, take only that dose. Do not take double or extra doses. What may interact with this medicine? Do not take this medicine with any of the following medicines:  aliskiren if you have diabetes  angiotensin-converting enzyme (ACE) inhibitors, like benazepril, captopril, enalapril, fosinopril, lisinopril, or ramipril This medicine may also interact with the following medicines:  angiotensin II receptor blockers (ARBs) like azilsartan, candesartan, eprosartan, irbesartan, losartan, olmesartan, telmisartan, or valsartan  lithium  NSAIDS, medicines for pain and inflammation, like ibuprofen or naproxen  potassium-sparing diuretics like amiloride, spironolactone, and triamterene  potassium supplements This list may not describe all possible interactions. Give your health care provider a list of all the medicines, herbs,  non-prescription drugs, or dietary supplements you use. Also tell them if you smoke, drink alcohol, or use illegal drugs. Some items may interact with your medicine. What should I watch for while using this medicine? Tell your doctor or healthcare professional if your symptoms do not start to get better or if they get worse. Do not become pregnant while taking this medicine. Women should inform their doctor if they wish to become pregnant or think they might be pregnant. There is a potential for serious side effects to an unborn child. Talk to your health care professional or pharmacist for more information. You may get dizzy. Do not drive, use machinery, or do anything that needs mental alertness until you know how this medicine affects you. Do not stand or sit up quickly, especially if you are an older patient. This reduces the risk of dizzy or fainting spells. Avoid alcoholic drinks; they can make you more dizzy. What side effects may I notice from receiving this medicine? Side effects that you should report to your doctor or health care professional as soon as possible:  allergic reactions like skin rash, itching or hives, swelling of the face, lips, or tongue  signs and symptoms of increased potassium like muscle weakness; chest pain; or fast, irregular  heartbeat  signs and symptoms of kidney injury like trouble passing urine or change in the amount of urine  signs and symptoms of low blood pressure like feeling dizzy or lightheaded, or if you develop extreme fatigue Side effects that usually do not require medical attention (report to your doctor or health care professional if they continue or are bothersome):  cough This list may not describe all possible side effects. Call your doctor for medical advice about side effects. You may report side effects to FDA at 1-800-FDA-1088. Where should I keep my medicine? Keep out of the reach of children and pets. Store at room temperature between 20  and 25 degrees C (68 and 77 degrees F). Protect from moisture. Keep the container tightly closed. Throw away any unused drug after the expiration date. NOTE: This sheet is a summary. It may not cover all possible information. If you have questions about this medicine, talk to your doctor, pharmacist, or health care provider.  2020 [employer]/Gold Standard (2019-06-08 16:03:07)

## 2020-06-06 NOTE — Progress Notes (Signed)
Cardiology Office Note:    Date:  06/06/2020   ID:  Margaret Ward, DOB 12/19/1940, MRN [id number]  PCP:  Wendling, Nicholas Paul, DO  Cardiologist:  Obediah Welles, DO  Electrophysiologist:  None   Referring MD: Wendling, Nicholas Paul*   Chief Complaint  Patient presents with  . Follow-up    History of Present Illness:    Margaret Ward is a 79 y.o. female with ahx ofCAD, CABGin March 2019 and subsequentNonSTEMI 03/01/2018 with PCI and stent heart failure CKD and acute GI bleed with gastric ulcer- leading to transfusion of 5 units of PRBC's and her dual antiplatelet therapy was discontinued with recurrent bleeding while hospitalized.She waslast seen 12/28/2018. 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  I saw the patient on Apr 11, 2020 at that time she reported that she was experiencing significant chest pain or shortness of breath.  At the conclusion of visit I started the patient on long-acting nitrates with Imdur 30 mg daily.  Also request that she get a nuclear stress test as well as an echocardiogram.  In the interim the patient was able to get the testing done.  Her nuclear did show decreased EF as well as echo for her EF.  Her nuclear scan was not definitive for an ischemia.  Today she is here for follow-up visit she tells me that she is experiencing more chest pain than prior, and she is concerned about this.    Past Medical History:  Diagnosis Date  . Anemia   . Arthritis    "hands, arms, all over the place" (03/02/2018)  . Bilateral carpal tunnel syndrome 10/11/2019  . Bronchospasm 03/28/2018  . CAD (coronary artery disease) 02/08/2018   Left heart cath 01/13/18: 40% distal LMCAS 90% proximal LAD  LCF with 90% proximal ramus stenosis, diffuse RCA, 60% distal and 90% proximal PDA stenosis  . CAD in native artery    a. s/p CABG in 01/2018. b. Recurrent CP 02/2018 -> cath showing early graft closure  SVG-diagonal, received DES to D1.  . 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 Carpal tunnel syndrome, bilateral 12/28/2018  . Chest pain 02/05/2018  . Chronic combined systolic and diastolic CHF (congestive heart failure) (HCC)    a. LVEF 40-45% by echo 02/2018.  . Chronic combined systolic and diastolic heart failure (HCC) 05/21/2018  . Chronic diastolic heart failure (HCC) 03/28/2018  . 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 Chronic hypoxemic respiratory failure (HCC) 05/21/2018  . Chronic lower back pain   . CKD (chronic kidney disease), stage III   . Diabetes mellitus type 2 in obese (HCC) 04/23/2018  . GERD (gastroesophageal reflux disease)   . Glass eye    right eye  . Gout    "on daily RX" (03/02/2018)  . Heme positive stool 05/21/2018  . History of kidney stones   . HLD (hyperlipidemia) 05/21/2018  . Hyperlipidemia   . Hypertension   . Hypertensive heart disease   . 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  . Kidney cysts   . Kidney disease   . Left ventricular aneurysm   . Migraine    "used to get them twice/week; haven't had them in a long time" (03/02/2018)  . NSTEMI (non-ST elevated myocardial infarction) (HCC) 03/01/2018  . On home oxygen therapy    "3L at night and prn during the day" (03/02/2018)  . Pneumonia      used to get it twice/year; stopped after I had hysterectomy" (03/02/2018)  . 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 Sinus tachycardia 03/09/2018  . Sleep apnea   . SOB (shortness of breath) 03/09/2018  . Symptomatic anemia 05/21/2018  . Type II diabetes mellitus (HCC)     Past Surgical History:  Procedure Laterality Date  . BACK SURGERY    . BIOPSY  05/22/2018   Procedure: BIOPSY;  Surgeon: Brahmbhatt, Parag, MD;  Location: [facility];  Service: Gastroenterology;;  . CARDIAC CATHETERIZATION  12/2017   "before OHS"  . CHOLECYSTECTOMY OPEN  1982  . CORONARY ARTERY BYPASS GRAFT  01/19/2018   "CABG  X3; in [place]"  . CORONARY STENT INTERVENTION N/A 03/02/2018   Procedure: CORONARY STENT INTERVENTION;  Surgeon: McAlhany, Christopher D, MD;  Location: [facility];  Service: Cardiovascular;  Laterality: N/A;  . DILATION AND CURETTAGE OF UTERUS    . ESOPHAGOGASTRODUODENOSCOPY (EGD) WITH PROPOFOL N/A 05/22/2018   Procedure: ESOPHAGOGASTRODUODENOSCOPY (EGD) WITH PROPOFOL;  Surgeon: Brahmbhatt, Parag, MD;  Location: [facility];  Service: Gastroenterology;  Laterality: N/A;  . ESOPHAGOGASTRODUODENOSCOPY (EGD) WITH PROPOFOL N/A 05/24/2018   Procedure: ESOPHAGOGASTRODUODENOSCOPY (EGD) WITH PROPOFOL;  Surgeon: Brahmbhatt, Parag, MD;  Location: [facility];  Service: Gastroenterology;  Laterality: N/A;  . 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 KNEE ARTHROSCOPY Right X 4  . LEFT HEART CATH AND CORS/GRAFTS ANGIOGRAPHY N/A 03/02/2018   Procedure: LEFT HEART CATH AND CORS/GRAFTS ANGIOGRAPHY;  Surgeon: McAlhany, Christopher D, MD;  [facility];  Service: Cardiovascular;  Laterality: N/A;  . LUMBAR DISC SURGERY    . REDUCTION MAMMAPLASTY Bilateral X 2  . SCHLEROTHERAPY  05/24/2018   Procedure: SCHLEROTHERAPY;  Surgeon: Brahmbhatt, Parag, MD;  Location: [facility];  Service: Gastroenterology;;  . TONSILLECTOMY  1953  . VAGINAL HYSTERECTOMY  1970    Current Medications: Current Meds  Medication Sig  . acetaminophen (TYLENOL) 500 MG tablet Take 500 mg by mouth every 6 (six) hours as needed for headache.  . allopurinol (ZYLOPRIM) 100 MG tablet TAKE 1 TABLET(100 MG) BY MOUTH DAILY  . atorvastatin (LIPITOR) 40 MG tablet TAKE 1 TABLET(40 MG) BY MOUTH EVERY EVENING  . bimatoprost (LUMIGAN) 0.03 % ophthalmic solution Place 1 drop into the left eye at bedtime.  . carvedilol (COREG) 12.5 MG tablet Take 1 tablet (12.5 mg total) by mouth 2 (two) times daily.  . clopidogrel (PLAVIX) 75 MG tablet TAKE 1 TABLET(75 MG) BY MOUTH DAILY  . furosemide (LASIX) 40 MG tablet TAKE 1 TABLET  BY MOUTH DAILY. TAKE 1 ADDITIONAL TABLET ON MONDAY, WEDNESDAY, AND FRIDAY  . isosorbide mononitrate (IMDUR) 30 MG 24 hr tablet Take 1 tablet (30 mg total) by mouth daily.  . nitroGLYCERIN (NITROSTAT) 0.4 MG SL tablet Place 1 tablet (0.4 mg total) under the tongue every 5 (five) minutes x 3 doses as needed for chest pain.  . pantoprazole (PROTONIX) 20 MG tablet Take 20 mg by mouth daily.  . potassium chloride (KLOR-CON) 10 MEQ tablet TAKE 2 TABLETS BY MOUTH WITH EVERY DOSE OF FUROSEMIDE  . vitamin C (ASCORBIC ACID) 500 MG tablet Take 500 mg by mouth 2 (two) times daily.     Allergies:   Methylprednisolone, Keflex [cephalexin], Ketorolac, Morphine and related, Novocain [procaine], and Septra [sulfamethoxazole-trimethoprim]   Social History   Socioeconomic History  . Marital status: Married    Spouse name: Not on file  . Number of children: Not on file  . Years of   education: Not on file  . Highest education level: Not on file  Occupational History  . Not on file  Tobacco Use  . Smoking status: Former Smoker    Packs/day: 0.12    Years: 3.00    Pack years: 0.36    Types: Cigarettes    Quit date: 1963    Years since quitting: 58.5  . Smokeless tobacco: Never Used  Vaping Use  . Vaping Use: Never used  Substance and Sexual Activity  . Alcohol use: Not Currently  . Drug use: Never  . Sexual activity: Not on file  Other Topics Concern  . Not on file  Social History Narrative  . Not on file   Social Determinants of Health   Financial Resource Strain:   . Difficulty of Paying Living Expenses:   Food Insecurity:   . Worried About Running Out of Food in the Last Year:   . Ran Out of Food in the Last Year:   Transportation Needs:   . Lack of Transportation (Medical):   . Lack of Transportation (Non-Medical):   Physical Activity:   . Days of Exercise per Week:   . Minutes of Exercise per Session:   Stress:   . Feeling of Stress :   Social Connections:   . Frequency of  Communication with Friends and Family:   . Frequency of Social Gatherings with Friends and Family:   . Attends Religious Services:   . Active Member of Clubs or Organizations:   . Attends Club or Organization Meetings:   . Marital Status:      Family History: The patient's family history includes Diabetes in her father; Heart Problems in her mother; Heart disease in her brother; Hypertension in her brother, daughter, daughter, daughter, and daughter; Kidney disease in her brother and sister.  ROS:   Review of Systems  Constitution: Negative for decreased appetite, fever and weight gain.  HENT: Negative for congestion, ear discharge, hoarse voice and sore throat.   Eyes: Negative for discharge, redness, vision loss in right eye and visual halos.  Cardiovascular: Negative for chest pain, dyspnea on exertion, leg swelling, orthopnea and palpitations.  Respiratory: Negative for cough, hemoptysis, shortness of breath and snoring.   Endocrine: Negative for heat intolerance and polyphagia.  Hematologic/Lymphatic: Negative for bleeding problem. Does not bruise/bleed easily.  Skin: Negative for flushing, nail changes, rash and suspicious lesions.  Musculoskeletal: Negative for arthritis, joint pain, muscle cramps, myalgias, neck pain and stiffness.  Gastrointestinal: Negative for abdominal pain, bowel incontinence, diarrhea and excessive appetite.  Genitourinary: Negative for decreased libido, genital sores and incomplete emptying.  Neurological: Negative for brief paralysis, focal weakness, headaches and loss of balance.  Psychiatric/Behavioral: Negative for altered mental status, depression and suicidal ideas.  Allergic/Immunologic: Negative for HIV exposure and persistent infections.    EKGs/Labs/Other Studies Reviewed:    The following studies were reviewed today:   EKG:  The ekg ordered today demonstrates   Left ventricle: The cavity size was normal. There was mild concentric  hypertrophy. Systolic function was mildly to moderately reduced. The estimated ejection fraction was in the range of 40% to 45%. Mild diffuse hypokinesis. There was an increased relative contribution of atrial contraction to ventricular filling. Doppler parameters are consistent with abnormal left ventricular relaxation (grade 1 diastolic dysfunction). - Mitral valve: Calcified annulus. There was mild regurgitation. - Left atrium: The atrium was moderately to severely dilated. - Right atrium: The atrium was moderately dilated.   Recent Labs: 04/11/2020: Magnesium 2.0 04/24/2020:   ALT 17; BUN 28; Creatinine, Ser 1.15; Potassium 4.3; Sodium 141  Recent Lipid Panel    Component Value Date/Time   CHOL 115 04/11/2020 0853   TRIG 122 04/11/2020 0853   HDL 57 04/11/2020 0853   CHOLHDL 2.0 04/11/2020 0853   CHOLHDL 2 08/26/2018 0920   VLDL 18.6 08/26/2018 0920   LDLCALC 37 04/11/2020 0853   LDLDIRECT 30 09/08/2019 1512    Physical Exam:    VS:  BP 130/70 (BP Location: Left Arm, Patient Position: Sitting, Cuff Size: Normal)   Pulse 76   Ht 5' 2" (1.575 m)   Wt 178 lb (80.7 kg)   SpO2 96%   BMI 32.56 kg/m     Wt Readings from Last 3 Encounters:  06/06/20 178 lb (80.7 kg)  04/24/20 176 lb 2 oz (79.9 kg)  04/20/20 174 lb (78.9 kg)     GEN: Well nourished, well developed in no acute distress HEENT: Normal NECK: No JVD; No carotid bruits LYMPHATICS: No lymphadenopathy CARDIAC: S1S2 noted,RRR, no murmurs, rubs, gallops RESPIRATORY:  Clear to auscultation without rales, wheezing or rhonchi  ABDOMEN: Soft, non-tender, non-distended, +bowel sounds, no guarding. EXTREMITIES: No edema, No cyanosis, no clubbing MUSCULOSKELETAL:  No deformity  SKIN: Warm and dry NEUROLOGIC:  Alert and oriented x 3, non-focal PSYCHIATRIC:  Normal affect, good insight  ASSESSMENT:    1. Coronary artery disease involving native heart, angina presence unspecified, unspecified vessel or  lesion type   2. Hypertensive heart disease with heart failure (HCC)   3. Stage 3 chronic kidney disease, unspecified whether stage 3a or 3b CKD   4. Diabetes mellitus type 2 in obese (HCC)   5. S/P CABG x 3    PLAN:     Given her recurrent chest pain and her high risk for progression of coronary artery disease I like to proceed with a left heart catheterization in this patient given her symptoms this by her medical therapy.  I did speak with the patient and her daughter, I explained to them that she may need hydration prior to her calf given the fact that she does have chronic kidney disease.  The patient understands that risks include but are not limited to stroke (1 in 1000), death (1 in 1000), kidney failure [usually temporary] (1 in 500), bleeding (1 in 200), allergic reaction [possibly serious] (1 in 200), and agrees to proceed.  In the meantime her EF has dropped from her echocardiogram which was done in April 2019 from 40 to 45% to now 5 to 30%.  We have switched her to carvedilol 12.5 mg twice a day.  I like to start the patient on Entresto 24-26 milligrams twice a day.  But given her kidney disease I am going to wait till after her left heart catheterization 2 days after she can start her Entresto.  We will continue to monitor her kidney function.  The patient is in agreement with the above plan. The patient left the office in stable condition.  The patient will follow up in 1 month or sooner if needed.   Medication Adjustments/Labs and Tests Ordered: Current medicines are reviewed at length with the patient today.  Concerns regarding medicines are outlined above.  Orders Placed This Encounter  Procedures  . Basic metabolic panel  . CBC with Differential/Platelet   Meds ordered this encounter  Medications  . sacubitril-valsartan (ENTRESTO) 24-26 MG    Sig: Take 1 tablet by mouth 2 (two) times daily.    Dispense:  60   tablet    Refill:  3    Do not start until 06/16/20 after  your cath.    Patient Instructions  Medication Instructions:  Your physician has recommended you make the following change in your medication:   Start Entresto 24-26 mg 2 days after your cath.  *If you need a refill on your cardiac medications before your next appointment, please call your pharmacy*   Lab Work: Your physician recommends that you have a CBC and BMET today in the office for your upcoming cath.  If you have labs (blood work) drawn today and your tests are completely normal, you will receive your results only by: . MyChart Message (if you have MyChart) OR . A paper copy in the mail If you have any lab test that is abnormal or we need to change your treatment, we will call you to review the results.   Testing/Procedures:    [facility] [place] [address], [address] [place] [address] Dept: [phone] Loc: [phone]  Leonor Macomber  06/06/2020  You are scheduled for a Cardiac Catheterization on Wednesday, July 28 with Dr. Christopher McAlhany.  1. Please arrive at the [facility] (Main Entrance A) at [facility]: [address] [place], [place] [address] at 11:00 AM (This time is two hours before your procedure to ensure your preparation). Free valet parking service is available.   Special note: Every effort is made to have your procedure done on time. Please understand that emergencies sometimes delay scheduled procedures.  2. Diet: Do not eat solid foods after midnight.  The patient may have clear liquids until 5am upon the day of the procedure.  3. Labs: You had your labs done in the office.  4. Medication instructions in preparation for your procedure:   Contrast Allergy: No  On the morning of your procedure, take your Plavix/Clopidogrel and any morning medicines NOT listed above.  You may use sips of water.  5. Plan for one night stay--bring personal  belongings. 6. Bring a current list of your medications and current insurance cards. 7. You MUST have a responsible person to drive you home. 8. Someone MUST be with you the first 24 hours after you arrive home or your discharge will be delayed. 9. Please wear clothes that are easy to get on and off and wear slip-on shoes.  Thank you for allowing us to care for you!   -- [facility]    Follow-Up: At [facility], you and your health needs are our priority.  As part of our continuing mission to provide you with exceptional heart care, we have created designated Provider Care Teams.  These Care Teams include your primary Cardiologist (physician) and Advanced Practice Providers (APPs -  Physician Assistants and Nurse Practitioners) who all work together to provide you with the care you need, when you need it.  We recommend signing up for the patient portal called "MyChart".  Sign up information is provided on this After Visit Summary.  MyChart is used to connect with patients for Virtual Visits (Telemedicine).  Patients are able to view lab/test results, encounter notes, upcoming appointments, etc.  Non-urgent messages can be sent to your provider as well.   To learn more about what you can do with MyChart, go to [email or web address].    Your next appointment:   1 month(s)  The format for your next appointment:   In Person  Provider:     Traevon Meiring, DO   Other Instructions Sacubitril; Valsartan Oral Tablets What is this medicine? SACUBITRIL; VALSARTAN (sak UE bi tril; val SAR tan) is a combination of a neprilysin inhibitor and a an angiotensin II receptor blocker. It treats heart failure. This medicine may be used for other purposes; ask your health care provider or pharmacist if you have questions. COMMON BRAND NAME(S): Entresto What should I tell my health care provider before I take this medicine? They need to know if you have any of these  conditions:  diabetes and take a medicine that contains aliskiren  kidney disease  liver disease  an unusual or allergic reaction to sacubitril; valsartan, drugs called angiotensin converting enzyme (ACE) inhibitors, angiotensin II receptor blockers (ARBs), other medicines, foods, dyes, or preservatives  pregnant or trying to get pregnant  breast-feeding How should I use this medicine? Take this drug by mouth. Take it as directed on the prescription label at the same time every day. You can take it with or without food. If it upsets your stomach, take it with food. Keep taking it unless your health care provider tells you to stop. Talk to your health care provider about the use of this drug in children. While it may be prescribed for children as young as 1 for selected conditions, precautions do apply. Overdosage: If you think you have taken too much of this medicine contact a poison control center or emergency room at once. NOTE: This medicine is only for you. Do not share this medicine with others. What if I miss a dose? If you miss a dose, take it as soon as you can. If it is almost time for your next dose, take only that dose. Do not take double or extra doses. What may interact with this medicine? Do not take this medicine with any of the following medicines:  aliskiren if you have diabetes  angiotensin-converting enzyme (ACE) inhibitors, like benazepril, captopril, enalapril, fosinopril, lisinopril, or ramipril This medicine may also interact with the following medicines:  angiotensin II receptor blockers (ARBs) like azilsartan, candesartan, eprosartan, irbesartan, losartan, olmesartan, telmisartan, or valsartan  lithium  NSAIDS, medicines for pain and inflammation, like ibuprofen or naproxen  potassium-sparing diuretics like amiloride, spironolactone, and triamterene  potassium supplements This list may not describe all possible interactions. Give your health care provider  a list of all the medicines, herbs, non-prescription drugs, or dietary supplements you use. Also tell them if you smoke, drink alcohol, or use illegal drugs. Some items may interact with your medicine. What should I watch for while using this medicine? Tell your doctor or healthcare professional if your symptoms do not start to get better or if they get worse. Do not become pregnant while taking this medicine. Women should inform their doctor if they wish to become pregnant or think they might be pregnant. There is a potential for serious side effects to an unborn child. Talk to your health care professional or pharmacist for more information. You may get dizzy. Do not drive, use machinery, or do anything that needs mental alertness until you know how this medicine affects you. Do not stand or sit up quickly, especially if you are an older patient. This reduces the risk of dizzy or fainting spells. Avoid alcoholic drinks; they can make you more dizzy. What side effects may I notice from receiving this medicine? Side effects that you should report to your doctor or health care professional as soon as possible:  allergic reactions like skin rash, itching or   hives, swelling of the face, lips, or tongue  signs and symptoms of increased potassium like muscle weakness; chest pain; or fast, irregular heartbeat  signs and symptoms of kidney injury like trouble passing urine or change in the amount of urine  signs and symptoms of low blood pressure like feeling dizzy or lightheaded, or if you develop extreme fatigue Side effects that usually do not require medical attention (report to your doctor or health care professional if they continue or are bothersome):  cough This list may not describe all possible side effects. Call your doctor for medical advice about side effects. You may report side effects to FDA at 1-800-FDA-1088. Where should I keep my medicine? Keep out of the reach of children and  pets. Store at room temperature between 20 and 25 degrees C (68 and 77 degrees F). Protect from moisture. Keep the container tightly closed. Throw away any unused drug after the expiration date. NOTE: This sheet is a summary. It may not cover all possible information. If you have questions about this medicine, talk to your doctor, pharmacist, or health care provider.  2020 Elsevier/Gold Standard (2019-06-08 16:03:07)      Adopting a Healthy Lifestyle.  Know what a healthy weight is for you (roughly BMI <25) and aim to maintain this   Aim for 7+ servings of fruits and vegetables daily   65-80+ fluid ounces of water or unsweet tea for healthy kidneys   Limit to max 1 drink of alcohol per day; avoid smoking/tobacco   Limit animal fats in diet for cholesterol and heart health - choose grass fed whenever available   Avoid highly processed foods, and foods high in saturated/trans fats   Aim for low stress - take time to unwind and care for your mental health   Aim for 150 min of moderate intensity exercise weekly for heart health, and weights twice weekly for bone health   Aim for 7-9 hours of sleep daily   When it comes to diets, agreement about the perfect plan isnt easy to find, even among the experts. Experts at the [employer] developed an idea known as the Healthy Eating Plate. Just imagine a plate divided into logical, healthy portions.   The emphasis is on diet quality:   Load up on vegetables and fruits - one-half of your plate: Aim for color and variety, and remember that potatoes dont count.   Go for whole grains - one-quarter of your plate: Whole wheat, barley, wheat berries, quinoa, oats, brown rice, and foods made with them. If you want pasta, go with whole wheat pasta.   Protein power - one-quarter of your plate: Fish, chicken, beans, and nuts are all healthy, versatile protein sources. Limit red meat.   The diet, however, does go beyond the  plate, offering a few other suggestions.   Use healthy plant oils, such as olive, canola, soy, corn, sunflower and peanut. Check the labels, and avoid partially hydrogenated oil, which have unhealthy trans fats.   If youre thirsty, drink water. Coffee and tea are good in moderation, but skip sugary drinks and limit milk and dairy products to one or two daily servings.   The type of carbohydrate in the diet is more important than the amount. Some sources of carbohydrates, such as vegetables, fruits, whole grains, and beans-are healthier than others.   Finally, stay active  Signed, Nicklos Gaxiola, DO  06/06/2020 11:23 AM    [facility] 

## 2020-06-07 ENCOUNTER — Other Ambulatory Visit: Payer: Self-pay | Admitting: Cardiology

## 2020-06-07 ENCOUNTER — Telehealth: Payer: Self-pay | Admitting: *Deleted

## 2020-06-07 DIAGNOSIS — I2510 Atherosclerotic heart disease of native coronary artery without angina pectoris: Secondary | ICD-10-CM

## 2020-06-07 LAB — CBC WITH DIFFERENTIAL/PLATELET
Basophils Absolute: 0.1 10*3/uL (ref 0.0–0.2)
Basos: 1 %
EOS (ABSOLUTE): 0.2 10*3/uL (ref 0.0–0.4)
Eos: 3 %
Hematocrit: 41.6 % (ref 34.0–46.6)
Hemoglobin: 13.5 g/dL (ref 11.1–15.9)
Immature Grans (Abs): 0 10*3/uL (ref 0.0–0.1)
Immature Granulocytes: 0 %
Lymphocytes Absolute: 1.6 10*3/uL (ref 0.7–3.1)
Lymphs: 28 %
MCH: 30.7 pg (ref 26.6–33.0)
MCHC: 32.5 g/dL (ref 31.5–35.7)
MCV: 95 fL (ref 79–97)
Monocytes Absolute: 0.5 10*3/uL (ref 0.1–0.9)
Monocytes: 8 %
Neutrophils Absolute: 3.3 10*3/uL (ref 1.4–7.0)
Neutrophils: 60 %
Platelets: 157 10*3/uL (ref 150–450)
RBC: 4.4 x10E6/uL (ref 3.77–5.28)
RDW: 13.2 % (ref 11.7–15.4)
WBC: 5.6 10*3/uL (ref 3.4–10.8)

## 2020-06-07 LAB — BASIC METABOLIC PANEL
BUN/Creatinine Ratio: 23 (ref 12–28)
BUN: 30 mg/dL — ABNORMAL HIGH (ref 8–27)
CO2: 22 mmol/L (ref 20–29)
Calcium: 8.8 mg/dL (ref 8.7–10.3)
Chloride: 106 mmol/L (ref 96–106)
Creatinine, Ser: 1.31 mg/dL — ABNORMAL HIGH (ref 0.57–1.00)
GFR calc Af Amer: 45 mL/min/{1.73_m2} — ABNORMAL LOW (ref >59)
GFR calc non Af Amer: 39 mL/min/{1.73_m2} — ABNORMAL LOW (ref >59)
Glucose: 215 mg/dL — ABNORMAL HIGH (ref 65–99)
Potassium: 4.7 mmol/L (ref 3.5–5.2)
Sodium: 141 mmol/L (ref 134–144)

## 2020-06-07 NOTE — Telephone Encounter (Addendum)
Pt contacted pre-catheterization scheduled at [facility] for: Wednesday June 13, 2020 1 PM Verified arrival time and place: [facility] Main Entrance [facility]) at: 8 AM-pre-procedure hydration    No solid food after midnight prior to cath, clear liquids until 5 AM day of procedure.  Hold: Entresto -day before and day of procedure-GFR 39 Lasix/KCl-day before and day of procedure-GFR 39  Except hold medications AM meds can be  taken pre-cath with sips of water including: ASA 81 mg Plavix 75 mg  Confirmed patient has responsible adult to drive home post procedure and observe 24 hours after arriving home: yes  You are allowed ONE visitor in the waiting room during your procedure. Both you and your visitor must wear a mask once you enter the hospital.      COVID-19 Pre-Screening Questions:  . In the past 14 days have you had a new cough associated with shortness of breath, fever (100.4 or greater) or sudden loss of taste or sense of smell? no . In the past 14 days have you been around anyone with known Covid 19? no . Any international travel in the past 14 days? no . Have you been vaccinated for COVID-19? Yes, see immunization history    Patient aware she should arrive at [facility] Wednesday July 28 at 8 AM for pre-procedure hydration. Pt advised I will follow up with her early next week and review instructions with her.

## 2020-06-12 NOTE — Telephone Encounter (Signed)
Reviewed procedure/mask/visitor instructions, COVID-19 questions with patient. 

## 2020-06-13 ENCOUNTER — Ambulatory Visit (HOSPITAL_COMMUNITY)
Admission: RE | Admit: 2020-06-13 | Discharge: 2020-06-13 | Disposition: A | Discharge status: Home / Self Care | Payer: Medicare Other | Attending: Cardiovascular Disease | Admitting: Cardiovascular Disease

## 2020-06-13 ENCOUNTER — Encounter (HOSPITAL_COMMUNITY)
Admission: RE | Disposition: A | Discharge status: Home / Self Care | Payer: Self-pay | Source: Home / Self Care | Attending: Cardiovascular Disease

## 2020-06-13 ENCOUNTER — Other Ambulatory Visit: Payer: Self-pay

## 2020-06-13 DIAGNOSIS — I252 Old myocardial infarction: Secondary | ICD-10-CM | POA: Insufficient documentation

## 2020-06-13 DIAGNOSIS — K219 Gastro-esophageal reflux disease without esophagitis: Secondary | ICD-10-CM | POA: Insufficient documentation

## 2020-06-13 DIAGNOSIS — I25110 Atherosclerotic heart disease of native coronary artery with unstable angina pectoris: Secondary | ICD-10-CM | POA: Diagnosis not present

## 2020-06-13 DIAGNOSIS — E669 Obesity, unspecified: Secondary | ICD-10-CM | POA: Insufficient documentation

## 2020-06-13 DIAGNOSIS — I2510 Atherosclerotic heart disease of native coronary artery without angina pectoris: Secondary | ICD-10-CM

## 2020-06-13 DIAGNOSIS — M109 Gout, unspecified: Secondary | ICD-10-CM | POA: Insufficient documentation

## 2020-06-13 DIAGNOSIS — G4730 Sleep apnea, unspecified: Secondary | ICD-10-CM | POA: Diagnosis not present

## 2020-06-13 DIAGNOSIS — Z87891 Personal history of nicotine dependence: Secondary | ICD-10-CM | POA: Diagnosis not present

## 2020-06-13 DIAGNOSIS — Z9981 Dependence on supplemental oxygen: Secondary | ICD-10-CM | POA: Diagnosis not present

## 2020-06-13 DIAGNOSIS — I130 Hypertensive heart and chronic kidney disease with heart failure and stage 1 through stage 4 chronic kidney disease, or unspecified chronic kidney disease: Secondary | ICD-10-CM | POA: Insufficient documentation

## 2020-06-13 DIAGNOSIS — Z951 Presence of aortocoronary bypass graft: Secondary | ICD-10-CM | POA: Insufficient documentation

## 2020-06-13 DIAGNOSIS — E785 Hyperlipidemia, unspecified: Secondary | ICD-10-CM | POA: Insufficient documentation

## 2020-06-13 DIAGNOSIS — I5042 Chronic combined systolic (congestive) and diastolic (congestive) heart failure: Secondary | ICD-10-CM | POA: Diagnosis not present

## 2020-06-13 DIAGNOSIS — E1122 Type 2 diabetes mellitus with diabetic chronic kidney disease: Secondary | ICD-10-CM | POA: Insufficient documentation

## 2020-06-13 DIAGNOSIS — Z7902 Long term (current) use of antithrombotics/antiplatelets: Secondary | ICD-10-CM | POA: Insufficient documentation

## 2020-06-13 DIAGNOSIS — N183 Chronic kidney disease, stage 3 unspecified: Secondary | ICD-10-CM | POA: Diagnosis not present

## 2020-06-13 DIAGNOSIS — Z6832 Body mass index (BMI) 32.0-32.9, adult: Secondary | ICD-10-CM | POA: Insufficient documentation

## 2020-06-13 DIAGNOSIS — Z79899 Other long term (current) drug therapy: Secondary | ICD-10-CM | POA: Diagnosis not present

## 2020-06-13 HISTORY — PX: LEFT HEART CATH AND CORS/GRAFTS ANGIOGRAPHY: CATH118250

## 2020-06-13 LAB — GLUCOSE, CAPILLARY: Glucose-Capillary: 174 mg/dL — ABNORMAL HIGH (ref 70–99)

## 2020-06-13 SURGERY — LEFT HEART CATH AND CORS/GRAFTS ANGIOGRAPHY
Anesthesia: LOCAL

## 2020-06-13 MED ORDER — HEPARIN (PORCINE) IN NACL 1000-0.9 UT/500ML-% IV SOLN
INTRAVENOUS | Status: AC
  Filled 2020-06-13: qty 1000

## 2020-06-13 MED ORDER — FENTANYL CITRATE (PF) 100 MCG/2ML IJ SOLN
INTRAMUSCULAR | Status: DC | PRN
  Administered 2020-06-13: 25 ug via INTRAVENOUS

## 2020-06-13 MED ORDER — IOHEXOL 350 MG/ML SOLN
INTRAVENOUS | Status: AC
  Filled 2020-06-13: qty 1

## 2020-06-13 MED ORDER — HEPARIN SODIUM (PORCINE) 1000 UNIT/ML IJ SOLN
INTRAMUSCULAR | Status: DC | PRN
  Administered 2020-06-13: 4000 [IU] via INTRAVENOUS

## 2020-06-13 MED ORDER — IOHEXOL 350 MG/ML SOLN
INTRAVENOUS | Status: DC | PRN
  Administered 2020-06-13: 80 mL

## 2020-06-13 MED ORDER — SODIUM CHLORIDE 0.9 % IV SOLN: INTRAVENOUS | Status: AC

## 2020-06-13 MED ORDER — SODIUM CHLORIDE 0.9% FLUSH: 3.0000 mL | INTRAVENOUS | Status: DC | PRN

## 2020-06-13 MED ORDER — HEPARIN (PORCINE) IN NACL 1000-0.9 UT/500ML-% IV SOLN
INTRAVENOUS | Status: DC | PRN
  Administered 2020-06-13 (×2): 500 mL

## 2020-06-13 MED ORDER — ACETAMINOPHEN 325 MG PO TABS: 650.0000 mg | ORAL_TABLET | ORAL | Status: DC | PRN

## 2020-06-13 MED ORDER — MIDAZOLAM HCL 2 MG/2ML IJ SOLN
INTRAMUSCULAR | Status: DC | PRN
  Administered 2020-06-13: 1 mg via INTRAVENOUS

## 2020-06-13 MED ORDER — LIDOCAINE HCL (PF) 1 % IJ SOLN
INTRAMUSCULAR | Status: DC | PRN
  Administered 2020-06-13: 2 mL

## 2020-06-13 MED ORDER — ONDANSETRON HCL 4 MG/2ML IJ SOLN: 4.0000 mg | Freq: Four times a day (QID) | INTRAMUSCULAR | Status: DC | PRN

## 2020-06-13 MED ORDER — SODIUM CHLORIDE 0.9% FLUSH: 3.0000 mL | Freq: Two times a day (BID) | INTRAVENOUS | Status: DC

## 2020-06-13 MED ORDER — VERAPAMIL HCL 2.5 MG/ML IV SOLN
INTRAVENOUS | Status: AC
  Filled 2020-06-13: qty 2

## 2020-06-13 MED ORDER — LABETALOL HCL 5 MG/ML IV SOLN: 10.0000 mg | INTRAVENOUS | Status: DC | PRN

## 2020-06-13 MED ORDER — LIDOCAINE HCL (PF) 1 % IJ SOLN
INTRAMUSCULAR | Status: AC
  Filled 2020-06-13: qty 30

## 2020-06-13 MED ORDER — FENTANYL CITRATE (PF) 100 MCG/2ML IJ SOLN
INTRAMUSCULAR | Status: AC
  Filled 2020-06-13: qty 2

## 2020-06-13 MED ORDER — ASPIRIN 81 MG PO CHEW: 81.0000 mg | CHEWABLE_TABLET | Freq: Once | ORAL | Status: DC

## 2020-06-13 MED ORDER — HYDRALAZINE HCL 20 MG/ML IJ SOLN: 10.0000 mg | INTRAMUSCULAR | Status: DC | PRN

## 2020-06-13 MED ORDER — SODIUM CHLORIDE 0.9 % IV SOLN: 250.0000 mL | INTRAVENOUS | Status: DC | PRN

## 2020-06-13 MED ORDER — SODIUM CHLORIDE 0.9 % WEIGHT BASED INFUSION: 1.0000 mL/kg/h | INTRAVENOUS | Status: DC

## 2020-06-13 MED ORDER — HEPARIN SODIUM (PORCINE) 1000 UNIT/ML IJ SOLN
INTRAMUSCULAR | Status: AC
  Filled 2020-06-13: qty 1

## 2020-06-13 MED ORDER — SODIUM CHLORIDE 0.9 % WEIGHT BASED INFUSION
3.0000 mL/kg/h | INTRAVENOUS | Status: AC
  Administered 2020-06-13: 3 mL/kg/h via INTRAVENOUS

## 2020-06-13 MED ORDER — MIDAZOLAM HCL 2 MG/2ML IJ SOLN
INTRAMUSCULAR | Status: AC
  Filled 2020-06-13: qty 2

## 2020-06-13 MED ORDER — VERAPAMIL HCL 2.5 MG/ML IV SOLN
INTRAVENOUS | Status: DC | PRN
  Administered 2020-06-13: 10 mL via INTRA_ARTERIAL

## 2020-06-13 SURGICAL SUPPLY — 11 items
CATH INFINITI 5 FR RCB (CATHETERS) ×2 IMPLANT
CATH INFINITI 5FR MULTPACK ANG (CATHETERS) ×2 IMPLANT
DEVICE RAD COMP TR BAND LRG (VASCULAR PRODUCTS) ×2 IMPLANT
GLIDESHEATH SLEND SS 6F .021 (SHEATH) ×2 IMPLANT
GUIDEWIRE INQWIRE 1.5J.035X260 (WIRE) ×1 IMPLANT
INQWIRE 1.5J .035X260CM (WIRE) ×2
KIT HEART LEFT (KITS) ×2 IMPLANT
PACK CARDIAC CATHETERIZATION (CUSTOM PROCEDURE TRAY) ×2 IMPLANT
TRANSDUCER W/STOPCOCK (MISCELLANEOUS) ×2 IMPLANT
TUBING CIL FLEX 10 FLL-RA (TUBING) ×2 IMPLANT
WIRE HI TORQ VERSACORE-J 145CM (WIRE) ×2 IMPLANT

## 2020-06-13 NOTE — Discharge Instructions (Signed)
Drink plenty of fluids for 48 hours and keep wrist elevated at heart level for 24 hours  Radial Site Care   This sheet gives you information about how to care for yourself after your procedure. Your health care provider may also give you more specific instructions. If you have problems or questions, contact your health care provider. What can I expect after the procedure? After the procedure, it is common to have:  Bruising and tenderness at the catheter insertion area. Follow these instructions at home: Medicines  Take over-the-counter and prescription medicines only as told by your health care provider. Insertion site care 1. Follow instructions from your health care provider about how to take care of your insertion site. Make sure you: ? Wash your hands with soap and water before you change your bandage (dressing). If soap and water are not available, use hand sanitizer. ? Remove your dressing as told by your health care provider. In 24 hours 2. Check your insertion site every day for signs of infection. Check for: ? Redness, swelling, or pain. ? Fluid or blood. ? Pus or a bad smell. ? Warmth. 3. Do not take baths, swim, or use a hot tub until your health care provider approves. 4. You may shower 24-48 hours after the procedure, or as directed by your health care provider. ? Remove the dressing and gently wash the site with plain soap and water. ? Pat the area dry with a clean towel. ? Do not rub the site. That could cause bleeding. 5. Do not apply powder or lotion to the site. Activity   1. For 24 hours after the procedure, or as directed by your health care provider: ? Do not flex or bend the affected arm. ? Do not push or pull heavy objects with the affected arm. ? Do not drive yourself home from the hospital or clinic. You may drive 24 hours after the procedure unless your health care provider tells you not to. ? Do not operate machinery or power tools. 2. Do not lift  anything that is heavier than 10 lb (4.5 kg), or the limit that you are told, until your health care provider says that it is safe.  For 4 days 3. Ask your health care provider when it is okay to: ? Return to work or school. ? Resume usual physical activities or sports. ? Resume sexual activity. General instructions  If the catheter site starts to bleed, raise your arm and put firm pressure on the site. If the bleeding does not stop, get help right away. This is a medical emergency.  If you went home on the same day as your procedure, a responsible adult should be with you for the first 24 hours after you arrive home.  Keep all follow-up visits as told by your health care provider. This is important. Contact a health care provider if:  You have a fever.  You have redness, swelling, or yellow drainage around your insertion site. Get help right away if:  You have unusual pain at the radial site.  The catheter insertion area swells very fast.  The insertion area is bleeding, and the bleeding does not stop when you hold steady pressure on the area.  Your arm or hand becomes pale, cool, tingly, or numb. These symptoms may represent a serious problem that is an emergency. Do not wait to see if the symptoms will go away. Get medical help right away. Call your local emergency services (911 in the [place] Do   not drive yourself to the hospital. Summary  After the procedure, it is common to have bruising and tenderness at the site.  Follow instructions from your health care provider about how to take care of your radial site wound. Check the wound every day for signs of infection.  Do not lift anything that is heavier than 10 lb (4.5 kg), or the limit that you are told, until your health care provider says that it is safe. This information is not intended to replace advice given to you by your health care provider. Make sure you discuss any questions you have with your health care  provider. Document Revised: 12/09/2017 Document Reviewed: 12/09/2017 Elsevier Patient Education  2020 [employer]  

## 2020-06-13 NOTE — Progress Notes (Signed)
TR band off and site bruised and soft, no external bleeding and no hematoma

## 2020-06-13 NOTE — Interval H&P Note (Signed)
History and Physical Interval Note:  06/13/2020 12:08 PM  Margaret Ward  has presented today for surgery, with the diagnosis of cad - angina.  The various methods of treatment have been discussed with the patient and family. After consideration of risks, benefits and other options for treatment, the patient has consented to  Procedure(s): LEFT HEART CATH AND CORS/GRAFTS ANGIOGRAPHY (N/A) as a surgical intervention.  The patient's history has been reviewed, patient examined, no change in status, stable for surgery.  I have reviewed the patient's chart and labs.  Questions were answered to the patient's satisfaction.    Cath Lab Visit (complete for each Cath Lab visit)  Clinical Evaluation Leading to the Procedure:   ACS: No.  Non-ACS:    Anginal Classification: CCS III  Anti-ischemic medical therapy: Maximal Therapy (2 or more classes of medications)  Non-Invasive Test Results: No non-invasive testing performed  Prior CABG: previous cabg        Verne Carrow

## 2020-06-14 ENCOUNTER — Encounter (HOSPITAL_COMMUNITY): Payer: Self-pay | Admitting: Cardiovascular Disease

## 2020-07-04 ENCOUNTER — Other Ambulatory Visit: Payer: Self-pay | Admitting: Family Medicine

## 2020-07-04 ENCOUNTER — Telehealth: Payer: Self-pay | Admitting: Family Medicine

## 2020-07-04 NOTE — Telephone Encounter (Signed)
Pt's daughter dropped off document to be filled out by provider ([employer] Parking Disability paperwork- 1 page) Pt's daughter would like to be called when document is ready to pick up at [phone]. Document put at front office tray under providers name.

## 2020-07-06 ENCOUNTER — Ambulatory Visit: Payer: Medicare Other | Admitting: Cardiology

## 2020-07-25 ENCOUNTER — Other Ambulatory Visit: Payer: Self-pay

## 2020-07-25 ENCOUNTER — Encounter: Payer: Self-pay | Admitting: Cardiology

## 2020-07-25 ENCOUNTER — Ambulatory Visit (INDEPENDENT_AMBULATORY_CARE_PROVIDER_SITE_OTHER): Payer: Medicare Other | Admitting: Cardiology

## 2020-07-25 VITALS — BP 140/82 | HR 88 | Ht 62.0 in | Wt 175.0 lb

## 2020-07-25 DIAGNOSIS — I519 Heart disease, unspecified: Secondary | ICD-10-CM | POA: Diagnosis not present

## 2020-07-25 DIAGNOSIS — I110 Hypertensive heart disease with heart failure: Secondary | ICD-10-CM | POA: Diagnosis not present

## 2020-07-25 DIAGNOSIS — I5032 Chronic diastolic (congestive) heart failure: Secondary | ICD-10-CM | POA: Diagnosis not present

## 2020-07-25 DIAGNOSIS — I214 Non-ST elevation (NSTEMI) myocardial infarction: Secondary | ICD-10-CM

## 2020-07-25 DIAGNOSIS — I10 Essential (primary) hypertension: Secondary | ICD-10-CM | POA: Diagnosis not present

## 2020-07-25 DIAGNOSIS — E669 Obesity, unspecified: Secondary | ICD-10-CM

## 2020-07-25 DIAGNOSIS — I2510 Atherosclerotic heart disease of native coronary artery without angina pectoris: Secondary | ICD-10-CM

## 2020-07-25 DIAGNOSIS — E1169 Type 2 diabetes mellitus with other specified complication: Secondary | ICD-10-CM

## 2020-07-25 DIAGNOSIS — N183 Chronic kidney disease, stage 3 unspecified: Secondary | ICD-10-CM | POA: Diagnosis not present

## 2020-07-25 DIAGNOSIS — E782 Mixed hyperlipidemia: Secondary | ICD-10-CM | POA: Diagnosis not present

## 2020-07-25 HISTORY — DX: Obesity, unspecified: E66.9

## 2020-07-25 MED ORDER — SPIRONOLACTONE 25 MG PO TABS: 12.5000 mg | ORAL_TABLET | Freq: Every day | ORAL | 3 refills | Status: DC

## 2020-07-25 NOTE — Progress Notes (Signed)
Cardiology Office Note:    Date:  07/25/2020   ID:  Margaret Ward, DOB 01/17/1941, MRN [id number]  PCP:  Sharlene Dory, DO  Cardiologist:  Thomasene Ripple, DO  Electrophysiologist:  None   Referring MD: Sharlene Dory*  I feel better  History of Present Illness:    Margaret Ward a 79 y.o.femalewith ahx ofCAD, CABGin March 2019 and subsequentNonSTEMI 03/01/2018 with PCI and stent heart failure CKD and acute GI bleed with gastric ulcer- leading to transfusion of5 units of PRBC's and her dual antiplatelet therapy was discontinued with recurrent bleeding while hospitalized. Other medical history include ischemic cardiomyopathy with an EF 25 to 30%, hypertension.  I last saw the patient on June 06, 2020 at that time she was experiencing recurrent chest pain I therefore sent the patient for left heart catheterization given her high risk factors for progression of coronary artery disease.  In the interim she had this testing done which did not show any new blockages.  All of her grafts were patent.  She is here today for follow-up visit with her daughter.  She tells me that she experienced some intermittent chest pain.  But the Imdur has been helping as well.  Past Medical History:  Diagnosis Date  . Anemia   . Arthritis    "hands, arms, all over the place" (03/02/2018)  . Bilateral carpal tunnel syndrome 10/11/2019  . Bronchospasm 03/28/2018  . CAD (coronary artery disease) 02/08/2018   Left heart cath 01/13/18: 40% distal LMCAS 90% proximal LAD  LCF with 90% proximal ramus stenosis, diffuse RCA, 60% distal and 90% proximal PDA stenosis  . CAD in native artery    a. s/p CABG in 01/2018. b. Recurrent CP 02/2018 -> cath showing early graft closure SVG-diagonal, received DES to D1.  Marland Kitchen CAD/s/p CABG Mar 2019 w/ DES to vein graft Apr 2019 05/21/2018  . 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 Carpal tunnel syndrome, bilateral 12/28/2018  . Chest pain 02/05/2018  . Chronic  combined systolic and diastolic CHF (congestive heart failure) (HCC)    a. LVEF 40-45% by echo 02/2018.  Marland Kitchen Chronic combined systolic and diastolic heart failure (HCC) 05/21/2018  . Chronic diastolic heart failure (HCC) 03/28/2018  . 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 Chronic hypoxemic respiratory failure (HCC) 05/21/2018  . Chronic lower back pain   . CKD (chronic kidney disease), stage III   . Diabetes mellitus type 2 in obese (HCC) 04/23/2018  . GERD (gastroesophageal reflux disease)   . Glass eye    right eye  . Gout    "on daily RX" (03/02/2018)  . Heme positive stool 05/21/2018  . History of kidney stones   . HLD (hyperlipidemia) 05/21/2018  . Hyperlipidemia   . Hypertension   . Hypertensive heart disease   . 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  . Kidney cysts   . Kidney disease   . Left ventricular aneurysm   . Migraine    "used to get them twice/week; haven't had them in a long time" (03/02/2018)  . NSTEMI (non-ST elevated myocardial infarction) (HCC) 03/01/2018  . On home oxygen therapy    "3L at night and prn during the day" (03/02/2018)  . Pneumonia    used to get it twice/year; stopped after I had hysterectomy" (03/02/2018)  . 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 Sinus tachycardia 03/09/2018  . Sleep apnea   . SOB (shortness of breath) 03/09/2018  . Symptomatic anemia 05/21/2018  . Type  II diabetes mellitus (HCC)     Past Surgical History:  Procedure Laterality Date  . BACK SURGERY    . BIOPSY  05/22/2018   Procedure: BIOPSY;  Surgeon: Kathi Der, MD;  Location: [facility];  Service: Gastroenterology;;  . CARDIAC CATHETERIZATION  12/2017   "before OHS"  . CHOLECYSTECTOMY OPEN  1982  . CORONARY ARTERY BYPASS GRAFT  01/19/2018   "CABG X3; in [place]"  . CORONARY STENT INTERVENTION N/A 03/02/2018   Procedure: CORONARY STENT INTERVENTION;  Surgeon: Kathleene Hazel, MD;  Location: [facility];  Service: Cardiovascular;  Laterality:  N/A;  . DILATION AND CURETTAGE OF UTERUS    . ESOPHAGOGASTRODUODENOSCOPY (EGD) WITH PROPOFOL N/A 05/22/2018   Procedure: ESOPHAGOGASTRODUODENOSCOPY (EGD) WITH PROPOFOL;  Surgeon: Kathi Der, MD;  Location: [facility];  Service: Gastroenterology;  Laterality: N/A;  . ESOPHAGOGASTRODUODENOSCOPY (EGD) WITH PROPOFOL N/A 05/24/2018   Procedure: ESOPHAGOGASTRODUODENOSCOPY (EGD) WITH PROPOFOL;  Surgeon: Kathi Der, MD;  Location: [facility];  Service: Gastroenterology;  Laterality: N/A;  . 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 KNEE ARTHROSCOPY Right X 4  . LEFT HEART CATH AND CORS/GRAFTS ANGIOGRAPHY N/A 03/02/2018   Procedure: LEFT HEART CATH AND CORS/GRAFTS ANGIOGRAPHY;  Surgeon: Kathleene Hazel, MD;  [facility];  Service: Cardiovascular;  Laterality: N/A;  . LEFT HEART CATH AND CORS/GRAFTS ANGIOGRAPHY N/A 06/13/2020   Procedure: LEFT HEART CATH AND CORS/GRAFTS ANGIOGRAPHY;  Surgeon: Kathleene Hazel, MD;  [facility];  Service: Cardiovascular;  Laterality: N/A;  . LUMBAR DISC SURGERY    . REDUCTION MAMMAPLASTY Bilateral X 2  . SCHLEROTHERAPY  05/24/2018   Procedure: Theresia Majors;  Surgeon: Kathi Der, MD;  Location: [facility];  Service: Gastroenterology;;  . TONSILLECTOMY  1953  . VAGINAL HYSTERECTOMY  1970    Current Medications: Current Meds  Medication Sig  . acetaminophen (TYLENOL) 500 MG tablet Take 1,000 mg by mouth every 6 (six) hours as needed for headache.   . allopurinol (ZYLOPRIM) 100 MG tablet TAKE 1 TABLET(100 MG) BY MOUTH DAILY  . atorvastatin (LIPITOR) 40 MG tablet TAKE 1 TABLET(40 MG) BY MOUTH EVERY EVENING (Patient taking differently: Take 40 mg by mouth at bedtime. )  . carvedilol (COREG) 12.5 MG tablet Take 1 tablet (12.5 mg total) by mouth 2 (two) times daily.  . clopidogrel (PLAVIX) 75 MG tablet TAKE 1 TABLET(75 MG) BY MOUTH DAILY (Patient taking differently: Take 75 mg by mouth daily. )  .  furosemide (LASIX) 40 MG tablet TAKE 1 TABLET BY MOUTH DAILY. TAKE 1 ADDITIONAL TABLET ON MONDAY, WEDNESDAY, AND FRIDAY (Patient taking differently: Take 40-80 mg by mouth See admin instructions. Take 40 mg daily, take an additional 40 mg to equal 80 mg on Mon, Wed, and Fri)  . Multiple Vitamin (MULTIVITAMIN WITH MINERALS) TABS tablet Take 1 tablet by mouth daily.  . nitroGLYCERIN (NITROSTAT) 0.4 MG SL tablet Place 1 tablet (0.4 mg total) under the tongue every 5 (five) minutes x 3 doses as needed for chest pain.  . pantoprazole (PROTONIX) 20 MG tablet Take 20 mg by mouth 2 (two) times daily.   . potassium chloride (KLOR-CON) 10 MEQ tablet TAKE 2 TABLETS BY MOUTH WITH EVERY DOSE OF FUROSEMIDE (Patient taking differently: Take 20-40 mEq by mouth See admin instructions. Take 20 mg with each dose of furosemide ( 20 mg with the 40 mg dose and 40 mg with the 80 mg dose))  . Pseudoeph-Doxylamine-DM-APAP (NYQUIL PO) Take 1  Dose by mouth at bedtime as needed (congestion).  . sacubitril-valsartan (ENTRESTO) 24-26 MG Take 1 tablet by mouth 2 (two) times daily.  . vitamin C (ASCORBIC ACID) 500 MG tablet Take 500 mg by mouth 2 (two) times daily.     Allergies:   Methylprednisolone, Keflex [cephalexin], Ketorolac, Morphine and related, Novocain [procaine], and Septra [sulfamethoxazole-trimethoprim]   Social History   Socioeconomic History  . Marital status: Married    Spouse name: Not on file  . Number of children: Not on file  . Years of education: Not on file  . Highest education level: Not on file  Occupational History  . Not on file  Tobacco Use  . Smoking status: Former Smoker    Packs/day: 0.12    Years: 3.00    Pack years: 0.36    Types: Cigarettes    Quit date: 1963    Years since quitting: 58.7  . Smokeless tobacco: Never Used  Vaping Use  . Vaping Use: Never used  Substance and Sexual Activity  . Alcohol use: Not Currently  . Drug use: Never  . Sexual activity: Not on file  Other  Topics Concern  . Not on file  Social History Narrative  . Not on file   Social Determinants of Health   Financial Resource Strain:   . Difficulty of Paying Living Expenses: Not on file  Food Insecurity:   . Worried About Programme researcher, broadcasting/film/video in the Last Year: Not on file  . Ran Out of Food in the Last Year: Not on file  Transportation Needs:   . Lack of Transportation (Medical): Not on file  . Lack of Transportation (Non-Medical): Not on file  Physical Activity:   . Days of Exercise per Week: Not on file  . Minutes of Exercise per Session: Not on file  Stress:   . Feeling of Stress : Not on file  Social Connections:   . Frequency of Communication with Friends and Family: Not on file  . Frequency of Social Gatherings with Friends and Family: Not on file  . Attends Religious Services: Not on file  . Active Member of Clubs or Organizations: Not on file  . Attends Banker Meetings: Not on file  . Marital Status: Not on file     Family History: The patient's family history includes Diabetes in her father; Heart Problems in her mother; Heart disease in her brother; Hypertension in her brother, daughter, daughter, daughter, and daughter; Kidney disease in her brother and sister.  ROS:   Review of Systems  Constitution: Negative for decreased appetite, fever and weight gain.  HENT: Negative for congestion, ear discharge, hoarse voice and sore throat.   Eyes: Negative for discharge, redness, vision loss in right eye and visual halos.  Cardiovascular: Negative for chest pain, dyspnea on exertion, leg swelling, orthopnea and palpitations.  Respiratory: Negative for cough, hemoptysis, shortness of breath and snoring.   Endocrine: Negative for heat intolerance and polyphagia.  Hematologic/Lymphatic: Negative for bleeding problem. Does not bruise/bleed easily.  Skin: Negative for flushing, nail changes, rash and suspicious lesions.  Musculoskeletal: Negative for arthritis,  joint pain, muscle cramps, myalgias, neck pain and stiffness.  Gastrointestinal: Negative for abdominal pain, bowel incontinence, diarrhea and excessive appetite.  Genitourinary: Negative for decreased libido, genital sores and incomplete emptying.  Neurological: Negative for brief paralysis, focal weakness, headaches and loss of balance.  Psychiatric/Behavioral: Negative for altered mental status, depression and suicidal ideas.  Allergic/Immunologic: Negative for HIV exposure and persistent  infections.    EKGs/Labs/Other Studies Reviewed:    The following studies were reviewed today:   EKG: None today  Echo IMPRESSIONS  1. Since the last study on 03/02/2018 LVEF has further decreased now 25-30% with diffuse hypokinesis.  2. Left ventricular ejection fraction, by estimation, is 25 to 30%. The left ventricle has severely decreased function. The left ventricle demonstrates global hypokinesis. Left ventricular diastolic parameters are consistent with Grade I diastolic dysfunction (impaired relaxation). Elevated left atrial pressure.  3. Right ventricular systolic function is normal. The right ventricular size is normal.  4. The mitral valve is normal in structure. No evidence of mitral valve regurgitation. No evidence of mitral stenosis.  5. The aortic valve is normal in structure. Aortic valve regurgitation is not visualized. No aortic stenosis is present.  6. The inferior vena cava is normal in size with greater than 50% respiratory variability, suggesting right atrial pressure of 3 mmHg.   LHC   Mid LM to Dist LM lesion is 40% stenosed.  Prox LAD lesion is 100% stenosed.  Previously placed Ost 1st Diag drug eluting stent is widely patent.  Balloon angioplasty was performed.  Ost RPDA lesion is 100% stenosed.  Prox RCA to Mid RCA lesion is 40% stenosed.  Ost Ramus to Ramus lesion is 30% stenosed.  LIMA and is normal in caliber.  Origin lesion is 100% stenosed.  SVG and  is normal in caliber.   1. 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 The LAD is occluded proximally just beyond the takeoff of the early Diagonal branch. The entire LAD fills from the patent LIMA graft. The Diagonal branch (intermediate branch) has a patent stent with no restenosis. The vein graft that presumably went to this branch is known to be occluded and was not injected.  3. The Circumflex and another intermediate branch are small to moderate in caliber and have minor plaque, unchanged from last cath.  4. The RCA is a large dominant vessel. There is diffuse 40% stenosis in the proximal/mid and distal RCA. The PDA is small to moderate in caliber. The ostium of the PDA has a chronic occlusion. The PDA fills from the patent vein graft.   Recommendations: Continue medical management of CAD   Recent Labs: 04/11/2020: Magnesium 2.0 04/24/2020: ALT 17 06/06/2020: BUN 30; Creatinine, Ser 1.31; Hemoglobin 13.5; Platelets 157; Potassium 4.7; Sodium 141  Recent Lipid Panel    Component Value Date/Time   CHOL 115 04/11/2020 0853   TRIG 122 04/11/2020 0853   HDL 57 04/11/2020 0853   CHOLHDL 2.0 04/11/2020 0853   CHOLHDL 2 08/26/2018 0920   VLDL 18.6 08/26/2018 0920   LDLCALC 37 04/11/2020 0853   LDLDIRECT 30 09/08/2019 1512    Physical Exam:    VS:  BP 140/82 (BP Location: Right Arm, Patient Position: Sitting, Cuff Size: Normal)   Pulse 88   Ht 5\' 2"  (1.575 m)   Wt 175 lb (79.4 kg)   SpO2 96%   BMI 32.01 kg/m     Wt Readings from Last 3 Encounters:  07/25/20 175 lb (79.4 kg)  06/13/20 178 lb (80.7 kg)  06/06/20 178 lb (80.7 kg)     GEN: Well nourished, well developed in no acute distress HEENT: Normal NECK: No JVD; No carotid bruits LYMPHATICS: No lymphadenopathy CARDIAC: S1S2 noted,RRR, no murmurs, rubs, gallops RESPIRATORY:  Clear to auscultation without rales, wheezing or rhonchi  ABDOMEN: Soft, non-tender, non-distended, +bowel sounds, no  guarding. EXTREMITIES:  No edema, No cyanosis, no clubbing MUSCULOSKELETAL:  No deformity  SKIN: Warm and dry NEUROLOGIC:  Alert and oriented x 3, non-focal PSYCHIATRIC:  Normal affect, good insight  ASSESSMENT:    1. NSTEMI (non-ST elevated myocardial infarction) (HCC)   2. Essential hypertension   3. Chronic diastolic heart failure (HCC)   4. Coronary artery disease involving native coronary artery of native heart without angina pectoris   5. CAD/s/p CABG Mar 2019 w/ DES to vein graft Apr 2019   6. Hypertensive heart disease with heart failure (HCC)   7. Diabetes mellitus type 2 in obese (HCC)   8. Stage 3 chronic kidney disease, unspecified whether stage 3a or 3b CKD   9. Mixed hyperlipidemia   10. Obesity (BMI 30-39.9)    PLAN:     Coronary artery disease appears to be stable based on recent left heart catheterization.  She did not require any PCI's.  I am going to continue the patient on her Plavix as well as atorvastatin 40 mg daily.    In terms of her ischemic cardiomyopathy she is on carvedilol 12.5 mg twice daily, Entresto 24-26, upon discussion patient tells me that she takes the [place] once daily of asked the patient take this medicine twice a day.  She will double check and if she actually is taking it twice a day I like to increase the dosage to 49-51 mg twice daily.  For now I am going to add Aldactone 12.5 mg daily.  Hypertension her blood pressure is acceptable in the office.  We will continue to monitor this given the fact medication changes.  Hyperlipidemia continue atorvastatin 40 mg daily. Blood work will be done today for BMP, mag.  Also have the patient come back in 1 month to get blood work for [employer] and mag to assess kidney function.  I plan to repeat her echo in 3 months.  The patient understands the need to lose weight with diet and exercise. We have discussed specific strategies for this.  The patient is in agreement with the above plan. The patient left  the office in stable condition.  The patient will follow up in 3 months or sooner if needed.   Medication Adjustments/Labs and Tests Ordered: Current medicines are reviewed at length with the patient today.  Concerns regarding medicines are outlined above.  Orders Placed This Encounter  Procedures  . Magnesium  . Basic metabolic panel  . Basic metabolic panel  . Magnesium   Meds ordered this encounter  Medications  . spironolactone (ALDACTONE) 25 MG tablet    Sig: Take 0.5 tablets (12.5 mg total) by mouth daily.    Dispense:  45 tablet    Refill:  3    Patient Instructions  Medication Instructions:  Your physician has recommended you make the following change in your medication:  START: Aldactone 12.5 mg take 0.5 tablet by mouth daily.  *If you need a refill  on your cardiac medications before your next appointment, please call your pharmacy*   Lab Work: Your physician recommends that you return for lab work in: TODAY: BMP, Mag IN ONE MONTH: BMP, Mag If you have labs (blood work) drawn today and your tests are completely normal, you will receive your results only by: Marland Kitchen MyChart Message (if you have MyChart) OR . A paper copy in the mail If you have any lab test that is abnormal or we need to change your treatment, we will call you to review the results.  Testing/Procedures: None   Follow-Up: At [facility], you and your health needs are our priority.  As part of our continuing mission to provide you with exceptional heart care, we have created designated Provider Care Teams.  These Care Teams include your primary Cardiologist (physician) and Advanced Practice Providers (APPs -  Physician Assistants and Nurse Practitioners) who all work together to provide you with the care you need, when you need it.  We recommend signing up for the patient portal called "MyChart".  Sign up information is provided on this After Visit Summary.  MyChart is used to connect with patients for  Virtual Visits (Telemedicine).  Patients are able to view lab/test results, encounter notes, upcoming appointments, etc.  Non-urgent messages can be sent to your provider as well.   To learn more about what you can do with MyChart, go to [email or web address].    Your next appointment:   3 month(s)  The format for your next appointment:   In Person  Provider:   Thomasene Ripple, DO   Other Instructions      Adopting a Healthy Lifestyle.  Know what a healthy weight is for you (roughly BMI <25) and aim to maintain this   Aim for 7+ servings of fruits and vegetables daily   65-80+ fluid ounces of water or unsweet tea for healthy kidneys   Limit to max 1 drink of alcohol per day; avoid smoking/tobacco   Limit animal fats in diet for cholesterol and heart health - choose grass fed whenever available   Avoid highly processed foods, and foods high in saturated/trans fats   Aim for low stress - take time to unwind and care for your mental health   Aim for 150 min of moderate intensity exercise weekly for heart health, and weights twice weekly for bone health   Aim for 7-9 hours of sleep daily   When it comes to diets, agreement about the perfect plan isnt easy to find, even among the experts. Experts at the [facility] [employer] [facility] developed an idea known as the Healthy Eating Plate. Just imagine a plate divided into logical, healthy portions.   The emphasis is on diet quality:   Load up on vegetables and fruits - one-half of your plate: Aim for color and variety, and remember that potatoes dont count.   Go for whole grains - one-quarter of your plate: Whole wheat, barley, wheat berries, quinoa, oats, brown rice, and foods made with them. If you want pasta, go with whole wheat pasta.   Protein power - one-quarter of your plate: Fish, chicken, beans, and nuts are all healthy, versatile protein sources. Limit red meat.   The diet, however, does go beyond the plate,  offering a few other suggestions.   Use healthy plant oils, such as olive, canola, soy, corn, sunflower and peanut. Check the labels, and avoid partially hydrogenated oil, which have unhealthy trans fats.   If youre thirsty, drink water. Coffee and tea are good in moderation, but skip sugary drinks and limit milk and dairy products to one or two daily servings.   The type of carbohydrate in the diet is more important than the amount. Some sources of carbohydrates, such as vegetables, fruits, whole grains, and beans-are healthier than others.   Finally, stay active  Signed, Thomasene Ripple, DO  07/25/2020 9:24 AM    [facility]

## 2020-07-25 NOTE — Patient Instructions (Signed)
Medication Instructions:  Your physician has recommended you make the following change in your medication:  START: Aldactone 12.5 mg take 0.5 tablet by mouth daily.  *If you need a refill  on your cardiac medications before your next appointment, please call your pharmacy*   Lab Work: Your physician recommends that you return for lab work in: TODAY: BMP, Mag IN ONE MONTH: BMP, Mag If you have labs (blood work) drawn today and your tests are completely normal, you will receive your results only by:  MyChart Message (if you have MyChart) OR  A paper copy in the mail If you have any lab test that is abnormal or we need to change your treatment, we will call you to review the results.   Testing/Procedures: None   Follow-Up: At [facility], you and your health needs are our priority.  As part of our continuing mission to provide you with exceptional heart care, we have created designated Provider Care Teams.  These Care Teams include your primary Cardiologist (physician) and Advanced Practice Providers (APPs -  Physician Assistants and Nurse Practitioners) who all work together to provide you with the care you need, when you need it.  We recommend signing up for the patient portal called "MyChart".  Sign up information is provided on this After Visit Summary.  MyChart is used to connect with patients for Virtual Visits (Telemedicine).  Patients are able to view lab/test results, encounter notes, upcoming appointments, etc.  Non-urgent messages can be sent to your provider as well.   To learn more about what you can do with MyChart, go to [email or web address].    Your next appointment:   3 month(s)  The format for your next appointment:   In Person  Provider:   Thomasene Ripple, DO   Other Instructions

## 2020-07-26 LAB — BASIC METABOLIC PANEL
BUN/Creatinine Ratio: 25 (ref 12–28)
BUN: 29 mg/dL — ABNORMAL HIGH (ref 8–27)
CO2: 23 mmol/L (ref 20–29)
Calcium: 9.3 mg/dL (ref 8.7–10.3)
Chloride: 106 mmol/L (ref 96–106)
Creatinine, Ser: 1.18 mg/dL — ABNORMAL HIGH (ref 0.57–1.00)
GFR calc Af Amer: 51 mL/min/{1.73_m2} — ABNORMAL LOW (ref >59)
GFR calc non Af Amer: 44 mL/min/{1.73_m2} — ABNORMAL LOW (ref >59)
Glucose: 182 mg/dL — ABNORMAL HIGH (ref 65–99)
Potassium: 4.4 mmol/L (ref 3.5–5.2)
Sodium: 144 mmol/L (ref 134–144)

## 2020-07-26 LAB — MAGNESIUM: Magnesium: 1.8 mg/dL (ref 1.6–2.3)

## 2020-08-08 ENCOUNTER — Other Ambulatory Visit: Payer: Self-pay | Admitting: Family Medicine

## 2020-08-21 ENCOUNTER — Other Ambulatory Visit: Payer: Self-pay | Admitting: Cardiology

## 2020-08-21 DIAGNOSIS — I10 Essential (primary) hypertension: Secondary | ICD-10-CM | POA: Diagnosis not present

## 2020-08-21 DIAGNOSIS — E782 Mixed hyperlipidemia: Secondary | ICD-10-CM

## 2020-08-21 DIAGNOSIS — I2510 Atherosclerotic heart disease of native coronary artery without angina pectoris: Secondary | ICD-10-CM | POA: Diagnosis not present

## 2020-08-22 ENCOUNTER — Other Ambulatory Visit: Payer: Self-pay | Admitting: Family Medicine

## 2020-08-22 LAB — BASIC METABOLIC PANEL
BUN/Creatinine Ratio: 30 — ABNORMAL HIGH (ref 12–28)
BUN: 40 mg/dL — ABNORMAL HIGH (ref 8–27)
CO2: 20 mmol/L (ref 20–29)
Calcium: 9.1 mg/dL (ref 8.7–10.3)
Chloride: 106 mmol/L (ref 96–106)
Creatinine, Ser: 1.32 mg/dL — ABNORMAL HIGH (ref 0.57–1.00)
GFR calc Af Amer: 44 mL/min/{1.73_m2} — ABNORMAL LOW (ref >59)
GFR calc non Af Amer: 38 mL/min/{1.73_m2} — ABNORMAL LOW (ref >59)
Glucose: 199 mg/dL — ABNORMAL HIGH (ref 65–99)
Potassium: 5 mmol/L (ref 3.5–5.2)
Sodium: 141 mmol/L (ref 134–144)

## 2020-08-22 LAB — MAGNESIUM: Magnesium: 2.1 mg/dL (ref 1.6–2.3)

## 2020-08-23 ENCOUNTER — Telehealth: Payer: Self-pay

## 2020-08-23 NOTE — Telephone Encounter (Signed)
-----   Message from Thomasene Ripple, DO sent at 08/22/2020  6:39 PM EDT ----- Cr slightly elevated than 4 weeks ago. We will continue to monitor.

## 2020-08-23 NOTE — Telephone Encounter (Signed)
Spoke with patient regarding results and recommendation.  Patient verbalizes understanding and is agreeable to plan of care. Advised patient to call back with any issues or concerns.  

## 2020-09-19 ENCOUNTER — Other Ambulatory Visit: Payer: Self-pay | Admitting: Family Medicine

## 2020-10-16 DIAGNOSIS — I25118 Atherosclerotic heart disease of native coronary artery with other forms of angina pectoris: Secondary | ICD-10-CM | POA: Insufficient documentation

## 2020-10-16 DIAGNOSIS — N289 Disorder of kidney and ureter, unspecified: Secondary | ICD-10-CM | POA: Insufficient documentation

## 2020-10-16 DIAGNOSIS — Z9981 Dependence on supplemental oxygen: Secondary | ICD-10-CM | POA: Insufficient documentation

## 2020-10-16 DIAGNOSIS — G8929 Other chronic pain: Secondary | ICD-10-CM | POA: Insufficient documentation

## 2020-10-16 DIAGNOSIS — M109 Gout, unspecified: Secondary | ICD-10-CM | POA: Insufficient documentation

## 2020-10-16 DIAGNOSIS — M545 Low back pain, unspecified: Secondary | ICD-10-CM | POA: Insufficient documentation

## 2020-10-16 DIAGNOSIS — I253 Aneurysm of heart: Secondary | ICD-10-CM | POA: Insufficient documentation

## 2020-10-16 DIAGNOSIS — Z87442 Personal history of urinary calculi: Secondary | ICD-10-CM | POA: Insufficient documentation

## 2020-10-16 DIAGNOSIS — I5042 Chronic combined systolic (congestive) and diastolic (congestive) heart failure: Secondary | ICD-10-CM | POA: Insufficient documentation

## 2020-10-16 DIAGNOSIS — N281 Cyst of kidney, acquired: Secondary | ICD-10-CM | POA: Insufficient documentation

## 2020-10-16 DIAGNOSIS — J189 Pneumonia, unspecified organism: Secondary | ICD-10-CM | POA: Insufficient documentation

## 2020-10-16 DIAGNOSIS — I119 Hypertensive heart disease without heart failure: Secondary | ICD-10-CM | POA: Insufficient documentation

## 2020-10-16 DIAGNOSIS — G43909 Migraine, unspecified, not intractable, without status migrainosus: Secondary | ICD-10-CM | POA: Insufficient documentation

## 2020-10-16 DIAGNOSIS — M1990 Unspecified osteoarthritis, unspecified site: Secondary | ICD-10-CM | POA: Insufficient documentation

## 2020-10-16 DIAGNOSIS — Z970 Presence of artificial eye: Secondary | ICD-10-CM | POA: Insufficient documentation

## 2020-10-16 DIAGNOSIS — I2510 Atherosclerotic heart disease of native coronary artery without angina pectoris: Secondary | ICD-10-CM | POA: Insufficient documentation

## 2020-10-16 DIAGNOSIS — D649 Anemia, unspecified: Secondary | ICD-10-CM | POA: Insufficient documentation

## 2020-10-17 ENCOUNTER — Other Ambulatory Visit: Payer: Self-pay | Admitting: Cardiology

## 2020-10-17 ENCOUNTER — Other Ambulatory Visit: Payer: Self-pay | Admitting: Family

## 2020-10-17 DIAGNOSIS — E785 Hyperlipidemia, unspecified: Secondary | ICD-10-CM

## 2020-10-17 NOTE — Telephone Encounter (Signed)
Rx request sent to pharmacy.  

## 2020-10-19 ENCOUNTER — Other Ambulatory Visit: Payer: Self-pay

## 2020-10-19 ENCOUNTER — Encounter: Payer: Self-pay | Admitting: Cardiology

## 2020-10-19 ENCOUNTER — Ambulatory Visit (INDEPENDENT_AMBULATORY_CARE_PROVIDER_SITE_OTHER): Payer: Medicare Other | Admitting: Cardiology

## 2020-10-19 VITALS — BP 124/68 | HR 80 | Ht 62.0 in | Wt 170.0 lb

## 2020-10-19 DIAGNOSIS — I110 Hypertensive heart disease with heart failure: Secondary | ICD-10-CM

## 2020-10-19 DIAGNOSIS — I519 Heart disease, unspecified: Secondary | ICD-10-CM | POA: Diagnosis not present

## 2020-10-19 DIAGNOSIS — I2510 Atherosclerotic heart disease of native coronary artery without angina pectoris: Secondary | ICD-10-CM | POA: Diagnosis not present

## 2020-10-19 DIAGNOSIS — E669 Obesity, unspecified: Secondary | ICD-10-CM

## 2020-10-19 MED ORDER — SACUBITRIL-VALSARTAN 49-51 MG PO TABS: 1.0000 | ORAL_TABLET | Freq: Two times a day (BID) | ORAL | 11 refills | Status: DC

## 2020-10-19 NOTE — Patient Instructions (Signed)
Medication Instructions:  Your physician has recommended you make the following change in your medication:   1. INCREASE ENTRESTO TO 49-51 TWICE DAILY.  *If you need a refill on your cardiac medications before your next appointment, please call your pharmacy*   Lab Work: NONE If you have labs (blood work) drawn today and your tests are completely normal, you will receive your results only by: Marland Kitchen MyChart Message (if you have MyChart) OR . A paper copy in the mail If you have any lab test that is abnormal or we need to change your treatment, we will call you to review the results.   Testing/Procedures: NONE   Follow-Up: At [facility], you and your health needs are our priority.  As part of our continuing mission to provide you with exceptional heart care, we have created designated Provider Care Teams.  These Care Teams include your primary Cardiologist (physician) and Advanced Practice Providers (APPs -  Physician Assistants and Nurse Practitioners) who all work together to provide you with the care you need, when you need it.  We recommend signing up for the patient portal called "MyChart".  Sign up information is provided on this After Visit Summary.  MyChart is used to connect with patients for Virtual Visits (Telemedicine).  Patients are able to view lab/test results, encounter notes, upcoming appointments, etc.  Non-urgent messages can be sent to your provider as well.   To learn more about what you can do with MyChart, go to [email or web address].    Your next appointment:   3 month(s)  The format for your next appointment:   In Person  Provider:   Thomasene Ripple, DO

## 2020-10-19 NOTE — Progress Notes (Signed)
Cardiology Office Note:    Date:  10/19/2020   ID:  Margaret RutherfordGeraldine H Ward, DOB 1941-03-23, MRN [id number]  PCP:  Sharlene DoryWendling, Nicholas Paul, DO  Cardiologist:  Thomasene RippleKardie Jaleigh Mccroskey, DO  Electrophysiologist:  None   Referring MD: Sharlene DoryWendling, Nicholas Paul*   " I am doing well but I have some intermittent chest pain.   History of Present Illness:    Margaret Ward is a 79 y.o. female with a hx of CAD, CABGin March 2019 and subsequentNonSTEMI 03/01/2018 with PCI and stent heart failure CKD and acute GI bleed with gastric ulcer- leading to transfusion of5 units of PRBC's and her dual antiplatelet therapy was discontinued with recurrent bleeding while hospitalized. Other medical history include ischemic cardiomyopathy with an EF 25 to 30%, hypertension.  I last saw the patient on June 06, 2020 at that time she was experiencing recurrent chest pain I therefore sent the patient for left heart catheterization given her high risk factors for progression of coronary artery disease.  In the interim she had this testing done which did not show any new blockages.  All of her grafts were patent.  At her last visit I added spironolactone 12.5 mg to her medical regimen.  At that time also realized that she was only taking Entresto once a day I did encourage the patient to take that twice daily. She is here today for follow-up visit she tells me that she experiencing chest pain which she described as when she moves her arms she feels the pain when she moves up and down and it is quite reproducible.  No other complaints at this time.   Past Medical History:  Diagnosis Date  . Anemia   . Arthritis    "hands, arms, all over the place" (03/02/2018)  . Bilateral carpal tunnel syndrome 10/11/2019  . Bronchospasm 03/28/2018  . CAD (coronary artery disease) 02/08/2018   Left heart cath 01/13/18: 40% distal LMCAS 90% proximal LAD  LCF with 90% proximal ramus stenosis, diffuse RCA, 60% distal and 90% proximal PDA  stenosis  . CAD in native artery    a. s/p CABG in 01/2018. b. Recurrent CP 02/2018 -> cath showing early graft closure SVG-diagonal, received DES to D1.  Marland Kitchen. 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 Carpal tunnel syndrome, bilateral 12/28/2018  . Chest pain 02/05/2018  . Chronic combined systolic and diastolic CHF (congestive heart failure) (HCC)    a. LVEF 40-45% by echo 02/2018.  Marland Kitchen. Chronic combined systolic and diastolic heart failure (HCC) 05/21/2018  . Chronic diastolic heart failure (HCC) 03/28/2018  . 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 Chronic hypoxemic respiratory failure (HCC) 05/21/2018  . Chronic lower back pain   . CKD (chronic kidney disease), stage III (HCC)   . Diabetes mellitus type 2 in obese (HCC) 04/23/2018  . GERD (gastroesophageal reflux disease)   . Glass eye    right eye  . Gout    "on daily RX" (03/02/2018)  . Heme positive stool 05/21/2018  . History of kidney stones   . HLD (hyperlipidemia) 05/21/2018  . Hyperlipidemia   . Hypertension   . Hypertensive heart disease   . 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  . Kidney cysts   . Kidney disease   . Left ventricular aneurysm   . Migraine    "used to get them twice/week; haven't had them in a long time" (03/02/2018)  . NSTEMI (  non-ST elevated myocardial infarction) (HCC) 03/01/2018  . On home oxygen therapy    "3L at night and prn during the day" (03/02/2018)  . Pneumonia    used to get it twice/year; stopped after I had hysterectomy" (03/02/2018)  . 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 Sinus tachycardia 03/09/2018  . Sleep apnea   . SOB (shortness of breath) 03/09/2018  . Symptomatic anemia 05/21/2018  . Type II diabetes mellitus (HCC)     Past Surgical History:  Procedure Laterality Date  . BACK SURGERY    . BIOPSY  05/22/2018   Procedure: BIOPSY;  Surgeon: Kathi Der, MD;  Location: [facility];  Service: Gastroenterology;;  . CARDIAC  CATHETERIZATION  12/2017   "before OHS"  . CHOLECYSTECTOMY OPEN  1982  . CORONARY ARTERY BYPASS GRAFT  01/19/2018   "CABG X3; in [place]"  . CORONARY STENT INTERVENTION N/A 03/02/2018   Procedure: CORONARY STENT INTERVENTION;  Surgeon: Kathleene Hazel, MD;  Location: [facility];  Service: Cardiovascular;  Laterality: N/A;  . DILATION AND CURETTAGE OF UTERUS    . ESOPHAGOGASTRODUODENOSCOPY (EGD) WITH PROPOFOL N/A 05/22/2018   Procedure: ESOPHAGOGASTRODUODENOSCOPY (EGD) WITH PROPOFOL;  Surgeon: Kathi Der, MD;  Location: [facility];  Service: Gastroenterology;  Laterality: N/A;  . ESOPHAGOGASTRODUODENOSCOPY (EGD) WITH PROPOFOL N/A 05/24/2018   Procedure: ESOPHAGOGASTRODUODENOSCOPY (EGD) WITH PROPOFOL;  Surgeon: Kathi Der, MD;  Location: [facility];  Service: Gastroenterology;  Laterality: N/A;  . 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 KNEE ARTHROSCOPY Right X 4  . LEFT HEART CATH AND CORS/GRAFTS ANGIOGRAPHY N/A 03/02/2018   Procedure: LEFT HEART CATH AND CORS/GRAFTS ANGIOGRAPHY;  Surgeon: Kathleene Hazel, MD;  [facility]: [facility];  Service: Cardiovascular;  Laterality: N/A;  . LEFT HEART CATH AND CORS/GRAFTS ANGIOGRAPHY N/A 06/13/2020   Procedure: LEFT HEART CATH AND CORS/GRAFTS ANGIOGRAPHY;  Surgeon: Kathleene Hazel, MD;  [facility];  Service: Cardiovascular;  Laterality: N/A;  . LUMBAR DISC SURGERY    . REDUCTION MAMMAPLASTY Bilateral X 2  . SCHLEROTHERAPY  05/24/2018   Procedure: Theresia Majors;  Surgeon: Kathi Der, MD;  Location: [facility];  Service: Gastroenterology;;  . TONSILLECTOMY  1953  . VAGINAL HYSTERECTOMY  1970    Current Medications: Current Meds  Medication Sig  . acetaminophen (TYLENOL) 500 MG tablet Take 1,000 mg by mouth every 6 (six) hours as needed for headache.   . allopurinol (ZYLOPRIM) 100 MG tablet TAKE 1 TABLET(100 MG) BY MOUTH DAILY  . atorvastatin (LIPITOR) 40 MG  tablet Take 1 tablet (40 mg total) by mouth at bedtime.  . carvedilol (COREG) 12.5 MG tablet Take 1 tablet (12.5 mg total) by mouth 2 (two) times daily.  . clopidogrel (PLAVIX) 75 MG tablet TAKE 1 TABLET(75 MG) BY MOUTH DAILY  . furosemide (LASIX) 40 MG tablet TAKE 1 TABLET BY MOUTH DAILY. TAKE 1 ADDITIONAL TABLET ON MONDAY, WEDNESDAY, AND FRIDAY (Patient taking differently: Take 40-80 mg by mouth See admin instructions. Take 40 mg daily, take an additional 40 mg to equal 80 mg on Mon, Wed, and Fri)  . Multiple Vitamin (MULTIVITAMIN WITH MINERALS) TABS tablet Take 1 tablet by mouth daily.  . nitroGLYCERIN (NITROSTAT) 0.4 MG SL tablet Place 1 tablet (0.4 mg total) under the tongue every 5 (five) minutes x 3 doses as needed for chest pain.  . pantoprazole (PROTONIX) 20 MG tablet Take 20 mg by mouth 2 (two) times daily.   Marland Kitchen spironolactone (ALDACTONE) 25 MG  tablet Take 0.5 tablets (12.5 mg total) by mouth daily.  . vitamin C (ASCORBIC ACID) 500 MG tablet Take 500 mg by mouth 2 (two) times daily.  . [DISCONTINUED] ENTRESTO 24-26 MG TAKE 1 TABLET BY MOUTH TWICE DAILY  . [DISCONTINUED] potassium chloride (KLOR-CON) 10 MEQ tablet TAKE 2 TABLETS BY MOUTH WITH EVERY DOSE OF FUROSEMIDE     Allergies:   Methylprednisolone, Keflex [cephalexin], Ketorolac, Morphine and related, Novocain [procaine], and Septra [sulfamethoxazole-trimethoprim]   Social History   Socioeconomic History  . Marital status: Married    Spouse name: Not on file  . Number of children: Not on file  . Years of education: Not on file  . Highest education level: Not on file  Occupational History  . Not on file  Tobacco Use  . Smoking status: Former Smoker    Packs/day: 0.12    Years: 3.00    Pack years: 0.36    Types: Cigarettes    Quit date: 1963    Years since quitting: 58.9  . Smokeless tobacco: Never Used  Vaping Use  . Vaping Use: Never used  Substance and Sexual Activity  . Alcohol use: Not Currently  . Drug use:  Never  . Sexual activity: Not on file  Other Topics Concern  . Not on file  Social History Narrative  . Not on file   Social Determinants of Health   Financial Resource Strain:   . Difficulty of Paying Living Expenses: Not on file  Food Insecurity:   . Worried About Programme researcher, broadcasting/film/video in the Last Year: Not on file  . Ran Out of Food in the Last Year: Not on file  Transportation Needs:   . Lack of Transportation (Medical): Not on file  . Lack of Transportation (Non-Medical): Not on file  Physical Activity:   . Days of Exercise per Week: Not on file  . Minutes of Exercise per Session: Not on file  Stress:   . Feeling of Stress : Not on file  Social Connections:   . Frequency of Communication with Friends and Family: Not on file  . Frequency of Social Gatherings with Friends and Family: Not on file  . Attends Religious Services: Not on file  . Active Member of Clubs or Organizations: Not on file  . Attends Banker Meetings: Not on file  . Marital Status: Not on file     Family History: The patient's family history includes Diabetes in her father; Heart Problems in her mother; Heart disease in her brother; Hypertension in her brother, daughter, daughter, daughter, and daughter; Kidney disease in her brother and sister.  ROS:   Review of Systems  Constitution: Negative for decreased appetite, fever and weight gain.  HENT: Negative for congestion, ear discharge, hoarse voice and sore throat.   Eyes: Negative for discharge, redness, vision loss in right eye and visual halos.  Cardiovascular: Reports chest pain which sounds musculoskeletal.  Negative for angina chest pain, dyspnea on exertion, leg swelling, orthopnea and palpitations.  Respiratory: Negative for cough, hemoptysis, shortness of breath and snoring.   Endocrine: Negative for heat intolerance and polyphagia.  Hematologic/Lymphatic: Negative for bleeding problem. Does not bruise/bleed easily.  Skin:  Negative for flushing, nail changes, rash and suspicious lesions.  Musculoskeletal: Negative for arthritis, joint pain, muscle cramps, myalgias, neck pain and stiffness.  Gastrointestinal: Negative for abdominal pain, bowel incontinence, diarrhea and excessive appetite.  Genitourinary: Negative for decreased libido, genital sores and incomplete emptying.  Neurological: Negative for brief  paralysis, focal weakness, headaches and loss of balance.  Psychiatric/Behavioral: Negative for altered mental status, depression and suicidal ideas.  Allergic/Immunologic: Negative for HIV exposure and persistent infections.    EKGs/Labs/Other Studies Reviewed:    The following studies were reviewed today:   EKG: None today  Recent Labs: 04/24/2020: ALT 17 06/06/2020: Hemoglobin 13.5; Platelets 157 08/21/2020: BUN 40; Creatinine, Ser 1.32; Magnesium 2.1; Potassium 5.0; Sodium 141  Recent Lipid Panel    Component Value Date/Time   CHOL 115 04/11/2020 0853   TRIG 122 04/11/2020 0853   HDL 57 04/11/2020 0853   CHOLHDL 2.0 04/11/2020 0853   CHOLHDL 2 08/26/2018 0920   VLDL 18.6 08/26/2018 0920   LDLCALC 37 04/11/2020 0853   LDLDIRECT 30 09/08/2019 1512    Physical Exam:    VS:  BP 124/68   Pulse 80   Ht 5\' 2"  (1.575 m)   Wt 170 lb (77.1 kg)   SpO2 98%   BMI 31.09 kg/m     Wt Readings from Last 3 Encounters:  10/19/20 170 lb (77.1 kg)  07/25/20 175 lb (79.4 kg)  06/13/20 178 lb (80.7 kg)     GEN: Well nourished, well developed in no acute distress HEENT: Normal NECK: No JVD; No carotid bruits LYMPHATICS: No lymphadenopathy CARDIAC: S1S2 noted,RRR, no murmurs, rubs, gallops RESPIRATORY:  Clear to auscultation without rales, wheezing or rhonchi  ABDOMEN: Soft, non-tender, non-distended, +bowel sounds, no guarding. EXTREMITIES: No edema, No cyanosis, no clubbing MUSCULOSKELETAL:  No deformity  SKIN: Warm and dry NEUROLOGIC:  Alert and oriented x 3, non-focal PSYCHIATRIC:  Normal  affect, good insight  ASSESSMENT:    1. Hypertensive heart disease with heart failure (HCC)   2. CAD/s/p CABG Mar 2019 w/ DES to vein graft Apr 2019   3. CAD in native artery   4. Obesity (BMI 30-39.9)    PLAN:    Her chest pain that she tells me about the office does sound musculoskeletal.  I was able to reproduce the pain in it is pinpoint.  I encouraged the patient take Tylenol she also is going to get topical lidocaine or Biofreeze to help with this.  However in terms of her cardiomyopathy we are going to titrate L-plate Entresto to the next dose 49-56 I am hoping that she can tolerate this medication.  But have educated the patient that if she feels lightheaded dizzy also to continue take her medications there is any hypotension will go back down to the lower dosing of Entresto.  We are planning to repeat her echocardiogram in 3 months.  She will stay on her carvedilol 1205 mg twice daily, spironolactone 12.5 mg twice daily, and now 49-51 twice daily of Entresto.  I am holding off on adding Farxiga for now.  Until after her repeat echocardiogram.  I have started the conversation with the patient that if her EF does not go up it will be best to start discussing with EP for fibrillator placement.  I am hoping that there is an improvement though with her ejection fraction at least greater than 40% that way we can avoid AICD placement. The patient understands the need to lose weight with diet and exercise. We have discussed specific strategies for this.  The patient is in agreement with the above plan. The patient left the office in stable condition.  The patient will follow up in 3 months or sooner if needed.   Medication Adjustments/Labs and Tests Ordered: Current medicines are reviewed at length with the  patient today.  Concerns regarding medicines are outlined above.  No orders of the defined types were placed in this encounter.  Meds ordered this encounter  Medications  .  sacubitril-valsartan (ENTRESTO) 49-51 MG    Sig: Take 1 tablet by mouth 2 (two) times daily.    Dispense:  60 tablet    Refill:  11    Patient Instructions  Medication Instructions:  Your physician has recommended you make the following change in your medication:   1. INCREASE ENTRESTO TO 49-51 TWICE DAILY.  *If you need a refill on your cardiac medications before your next appointment, please call your pharmacy*   Lab Work: NONE If you have labs (blood work) drawn today and your tests are completely normal, you will receive your results only by: Marland Kitchen MyChart Message (if you have MyChart) OR . A paper copy in the mail If you have any lab test that is abnormal or we need to change your treatment, we will call you to review the results.   Testing/Procedures: NONE   Follow-Up: At [facility], you and your health needs are our priority.  As part of our continuing mission to provide you with exceptional heart care, we have created designated Provider Care Teams.  These Care Teams include your primary Cardiologist (physician) and Advanced Practice Providers (APPs -  Physician Assistants and Nurse Practitioners) who all work together to provide you with the care you need, when you need it.  We recommend signing up for the patient portal called "MyChart".  Sign up information is provided on this After Visit Summary.  MyChart is used to connect with patients for Virtual Visits (Telemedicine).  Patients are able to view lab/test results, encounter notes, upcoming appointments, etc.  Non-urgent messages can be sent to your provider as well.   To learn more about what you can do with MyChart, go to [email or web address].    Your next appointment:   3 month(s)  The format for your next appointment:   In Person  Provider:   Thomasene Ripple, DO       Adopting a Healthy Lifestyle.  Know what a healthy weight is for you (roughly BMI <25) and aim to maintain this   Aim for 7+ servings  of fruits and vegetables daily   65-80+ fluid ounces of water or unsweet tea for healthy kidneys   Limit to max 1 drink of alcohol per day; avoid smoking/tobacco   Limit animal fats in diet for cholesterol and heart health - choose grass fed whenever available   Avoid highly processed foods, and foods high in saturated/trans fats   Aim for low stress - take time to unwind and care for your mental health   Aim for 150 min of moderate intensity exercise weekly for heart health, and weights twice weekly for bone health   Aim for 7-9 hours of sleep daily   When it comes to diets, agreement about the perfect plan isnt easy to find, even among the experts. Experts at the [facility] of [employer] developed an idea known as the Healthy Eating Plate. Just imagine a plate divided into logical, healthy portions.   The emphasis is on diet quality:   Load up on vegetables and fruits - one-half of your plate: Aim for color and variety, and remember that potatoes dont count.   Go for whole grains - one-quarter of your plate: Whole wheat, barley, wheat berries, quinoa, oats, brown rice, and foods made with them. If you want pasta,  go with whole wheat pasta.   Protein power - one-quarter of your plate: Fish, chicken, beans, and nuts are all healthy, versatile protein sources. Limit red meat.   The diet, however, does go beyond the plate, offering a few other suggestions.   Use healthy plant oils, such as olive, canola, soy, corn, sunflower and peanut. Check the labels, and avoid partially hydrogenated oil, which have unhealthy trans fats.   If youre thirsty, drink water. Coffee and tea are good in moderation, but skip sugary drinks and limit milk and dairy products to one or two daily servings.   The type of carbohydrate in the diet is more important than the amount. Some sources of carbohydrates, such as vegetables, fruits, whole grains, and beans-are healthier than others.   Finally,  stay active  Signed, Thomasene Ripple, DO  10/19/2020 8:35 AM    [facility]

## 2020-10-24 ENCOUNTER — Other Ambulatory Visit: Payer: Self-pay | Admitting: Family Medicine

## 2020-10-24 ENCOUNTER — Other Ambulatory Visit: Payer: Self-pay

## 2020-10-24 ENCOUNTER — Ambulatory Visit (INDEPENDENT_AMBULATORY_CARE_PROVIDER_SITE_OTHER): Payer: Medicare Other | Admitting: Family Medicine

## 2020-10-24 ENCOUNTER — Encounter: Payer: Self-pay | Admitting: Family Medicine

## 2020-10-24 VITALS — BP 138/82 | HR 65 | Temp 98.1°F | Ht 62.0 in | Wt 172.4 lb

## 2020-10-24 DIAGNOSIS — R5381 Other malaise: Secondary | ICD-10-CM

## 2020-10-24 DIAGNOSIS — E1139 Type 2 diabetes mellitus with other diabetic ophthalmic complication: Secondary | ICD-10-CM | POA: Diagnosis not present

## 2020-10-24 HISTORY — DX: Other malaise: R53.81

## 2020-10-24 LAB — COMPREHENSIVE METABOLIC PANEL
ALT: 13 U/L (ref 0–35)
AST: 14 U/L (ref 0–37)
Albumin: 3.9 g/dL (ref 3.5–5.2)
Alkaline Phosphatase: 84 U/L (ref 39–117)
BUN: 39 mg/dL — ABNORMAL HIGH (ref 6–23)
CO2: 24 mEq/L (ref 19–32)
Calcium: 8.9 mg/dL (ref 8.4–10.5)
Chloride: 110 mEq/L (ref 96–112)
Creatinine, Ser: 1.32 mg/dL — ABNORMAL HIGH (ref 0.40–1.20)
GFR: 38.28 mL/min — ABNORMAL LOW (ref >60.00)
Glucose, Bld: 182 mg/dL — ABNORMAL HIGH (ref 70–99)
Potassium: 3.9 mEq/L (ref 3.5–5.1)
Sodium: 143 mEq/L (ref 135–145)
Total Bilirubin: 0.6 mg/dL (ref 0.2–1.2)
Total Protein: 6.3 g/dL (ref 6.0–8.3)

## 2020-10-24 LAB — MICROALBUMIN / CREATININE URINE RATIO
Creatinine,U: 61.3 mg/dL
Microalb Creat Ratio: 5 mg/g (ref 0.0–30.0)
Microalb, Ur: 3.1 mg/dL — ABNORMAL HIGH (ref 0.0–1.9)

## 2020-10-24 LAB — LIPID PANEL
Cholesterol: 96 mg/dL (ref 0–200)
HDL: 44.9 mg/dL (ref >39.00)
LDL Cholesterol: 29 mg/dL (ref 0–99)
NonHDL: 50.79
Total CHOL/HDL Ratio: 2
Triglycerides: 110 mg/dL (ref 0.0–149.0)
VLDL: 22 mg/dL (ref 0.0–40.0)

## 2020-10-24 LAB — HEMOGLOBIN A1C: Hgb A1c MFr Bld: 8.5 % — ABNORMAL HIGH (ref 4.6–6.5)

## 2020-10-24 MED ORDER — METFORMIN HCL ER 500 MG PO TB24: ORAL_TABLET | ORAL | 3 refills | Status: DC

## 2020-10-24 NOTE — Progress Notes (Signed)
Home health

## 2020-10-24 NOTE — Progress Notes (Signed)
Subjective:   Chief Complaint  Patient presents with  . Follow-up    Margaret Ward is a 79 y.o. female here for follow-up of diabetes. Here w daughter.  Macey's self monitored glucose range is low 200's.  Patient denies hypoglycemic reactions. She checks her glucose levels 2 time(s) per day.  Patient does not require insulin.   Medications include: diet controlled Diet is healthy.  Exercise: none  Pt had a fall a couple weeks ago, both she and her daughter feel she was moving to quickly.  She uses a walker at baseline.  Sometimes when she is doing things, the walker is not where she needs it to be and she does not take the time to get it.  She is not currently, nor has she recently been, working with physical therapy.  She does follow with the cardiology team and they are aware of this issue.  They think it is because she is trying to do too much which both the patient and the patient's daughter agree with.  Past Medical History:  Diagnosis Date  . Anemia   . Arthritis    "hands, arms, all over the place" (03/02/2018)  . Bilateral carpal tunnel syndrome 10/11/2019  . Bronchospasm 03/28/2018  . CAD (coronary artery disease) 02/08/2018   Left heart cath 01/13/18: 40% distal LMCAS 90% proximal LAD  LCF with 90% proximal ramus stenosis, diffuse RCA, 60% distal and 90% proximal PDA stenosis  . CAD in native artery    a. s/p CABG in 01/2018. b. Recurrent CP 02/2018 -> cath showing early graft closure SVG-diagonal, received DES to D1.  Marland Kitchen CAD/s/p CABG Mar 2019 w/ DES to vein graft Apr 2019 05/21/2018  . 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 Carpal tunnel syndrome, bilateral 12/28/2018  . Chest pain 02/05/2018  . Chronic combined systolic and diastolic CHF (congestive heart failure) (HCC)    a. LVEF 40-45% by echo 02/2018.  Marland Kitchen Chronic combined systolic and diastolic heart failure (HCC) 05/21/2018  . Chronic diastolic heart failure (HCC) 03/28/2018  . 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  Chronic hypoxemic respiratory failure (HCC) 05/21/2018  . Chronic lower back pain   . CKD (chronic kidney disease), stage III (HCC)   . Diabetes mellitus type 2 in obese (HCC) 04/23/2018  . GERD (gastroesophageal reflux disease)   . Glass eye    right eye  . Gout    "on daily RX" (03/02/2018)  . Heme positive stool 05/21/2018  . History of kidney stones   . HLD (hyperlipidemia) 05/21/2018  . Hyperlipidemia   . Hypertension   . Hypertensive heart disease   . 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  . Kidney cysts   . Kidney disease   . Left ventricular aneurysm   . Migraine    "used to get them twice/week; haven't had them in a long time" (03/02/2018)  . NSTEMI (non-ST elevated myocardial infarction) (HCC) 03/01/2018  . On home oxygen therapy    "3L at night and prn during the day" (03/02/2018)  . Pneumonia    used to get it twice/year; stopped after I had hysterectomy" (03/02/2018)  . 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 Sinus tachycardia 03/09/2018  . Sleep apnea   . SOB (shortness of breath) 03/09/2018  . Symptomatic anemia 05/21/2018  . Type II diabetes mellitus (HCC)      Related testing: Retinal exam: Duedone Pneumovax: done  Objective:  BP 138/82 (BP Location: Right Arm, Patient Position: Sitting, Cuff  Size: Normal)   Pulse 65   Temp 98.1 F (36.7 C) (Oral)   Ht 5\' 2"  (1.575 m)   Wt 172 lb 6 oz (78.2 kg)   SpO2 94%   BMI 31.53 kg/m  General:  Well developed, well nourished, in no apparent distress Skin:  Warm, no pallor or diaphoresis Lungs:  CTAB, no access msc use Cardio:  RRR Musculoskeletal:  Symmetrical muscle groups noted without atrophy or deformity Neuro:  Sensation intact to pinprick on feet Psych: Age appropriate judgment and insight  Assessment:   Type 2 diabetes mellitus with other ophthalmic complication, without long-term current use of insulin (HCC) - Plan: Ambulatory referral to Ophthalmology, Microalbumin / creatinine urine ratio, Lipid  panel, Hemoglobin A1c, Comprehensive metabolic panel  Physical deconditioning   Plan:   1.  Refer to ophthalmology, check above labs.  Counseled on diet and exercise.  Follow-up in 3 to 6 months pending results.  Continue to monitor blood sugars at home. 2.  Patient instructed to be diligent with walker use.  We will set up home health physical therapy due to her balance issues. The patient and her daughter voiced understanding and agreement to the plan.  [place], DO 10/24/20 10:30 AM

## 2020-10-24 NOTE — Patient Instructions (Signed)
Give us 2-3 business days to get the results of your labs back.   Keep the diet clean and stay active.  If you do not hear anything about your referrals in the next 1-2 weeks, call our office and ask for an update.  Let us know if you need anything.  

## 2020-10-25 ENCOUNTER — Telehealth: Payer: Self-pay | Admitting: Family Medicine

## 2020-10-25 NOTE — Telephone Encounter (Signed)
Caller:Gerry  Call back: [phone]   Wanting to know more about medication they are putting her on & what the steps are

## 2020-10-26 NOTE — Telephone Encounter (Signed)
Patient informed of instructions on Metformin.  She verbalized understanding.

## 2020-11-03 DIAGNOSIS — E1122 Type 2 diabetes mellitus with diabetic chronic kidney disease: Secondary | ICD-10-CM | POA: Diagnosis not present

## 2020-11-03 DIAGNOSIS — I509 Heart failure, unspecified: Secondary | ICD-10-CM | POA: Diagnosis not present

## 2020-11-03 DIAGNOSIS — I5040 Unspecified combined systolic (congestive) and diastolic (congestive) heart failure: Secondary | ICD-10-CM | POA: Diagnosis not present

## 2020-11-03 DIAGNOSIS — D649 Anemia, unspecified: Secondary | ICD-10-CM | POA: Diagnosis not present

## 2020-11-03 DIAGNOSIS — M545 Low back pain, unspecified: Secondary | ICD-10-CM | POA: Diagnosis not present

## 2020-11-03 DIAGNOSIS — M150 Primary generalized (osteo)arthritis: Secondary | ICD-10-CM | POA: Diagnosis not present

## 2020-11-03 DIAGNOSIS — G4730 Sleep apnea, unspecified: Secondary | ICD-10-CM | POA: Diagnosis not present

## 2020-11-03 DIAGNOSIS — Z9981 Dependence on supplemental oxygen: Secondary | ICD-10-CM | POA: Diagnosis not present

## 2020-11-03 DIAGNOSIS — G8929 Other chronic pain: Secondary | ICD-10-CM | POA: Diagnosis not present

## 2020-11-03 DIAGNOSIS — K219 Gastro-esophageal reflux disease without esophagitis: Secondary | ICD-10-CM | POA: Diagnosis not present

## 2020-11-03 DIAGNOSIS — I2510 Atherosclerotic heart disease of native coronary artery without angina pectoris: Secondary | ICD-10-CM | POA: Diagnosis not present

## 2020-11-03 DIAGNOSIS — J9611 Chronic respiratory failure with hypoxia: Secondary | ICD-10-CM | POA: Diagnosis not present

## 2020-11-03 DIAGNOSIS — N183 Chronic kidney disease, stage 3 unspecified: Secondary | ICD-10-CM | POA: Diagnosis not present

## 2020-11-03 DIAGNOSIS — E785 Hyperlipidemia, unspecified: Secondary | ICD-10-CM | POA: Diagnosis not present

## 2020-11-03 DIAGNOSIS — I130 Hypertensive heart and chronic kidney disease with heart failure and stage 1 through stage 4 chronic kidney disease, or unspecified chronic kidney disease: Secondary | ICD-10-CM | POA: Diagnosis not present

## 2020-11-06 DIAGNOSIS — G8929 Other chronic pain: Secondary | ICD-10-CM | POA: Diagnosis not present

## 2020-11-06 DIAGNOSIS — N183 Chronic kidney disease, stage 3 unspecified: Secondary | ICD-10-CM | POA: Diagnosis not present

## 2020-11-06 DIAGNOSIS — I5040 Unspecified combined systolic (congestive) and diastolic (congestive) heart failure: Secondary | ICD-10-CM | POA: Diagnosis not present

## 2020-11-06 DIAGNOSIS — I130 Hypertensive heart and chronic kidney disease with heart failure and stage 1 through stage 4 chronic kidney disease, or unspecified chronic kidney disease: Secondary | ICD-10-CM | POA: Diagnosis not present

## 2020-11-06 DIAGNOSIS — E1122 Type 2 diabetes mellitus with diabetic chronic kidney disease: Secondary | ICD-10-CM | POA: Diagnosis not present

## 2020-11-06 DIAGNOSIS — M150 Primary generalized (osteo)arthritis: Secondary | ICD-10-CM | POA: Diagnosis not present

## 2020-11-12 ENCOUNTER — Encounter: Payer: Self-pay | Admitting: Family Medicine

## 2020-11-12 ENCOUNTER — Other Ambulatory Visit: Payer: Self-pay

## 2020-11-12 ENCOUNTER — Ambulatory Visit (INDEPENDENT_AMBULATORY_CARE_PROVIDER_SITE_OTHER): Payer: Medicare Other | Admitting: Family Medicine

## 2020-11-12 VITALS — BP 110/66 | HR 79 | Temp 97.9°F | Wt 171.4 lb

## 2020-11-12 DIAGNOSIS — M545 Low back pain, unspecified: Secondary | ICD-10-CM

## 2020-11-12 DIAGNOSIS — M7061 Trochanteric bursitis, right hip: Secondary | ICD-10-CM

## 2020-11-12 DIAGNOSIS — S76011A Strain of muscle, fascia and tendon of right hip, initial encounter: Secondary | ICD-10-CM | POA: Diagnosis not present

## 2020-11-12 MED ORDER — PREDNISONE 20 MG PO TABS
40.0000 mg | ORAL_TABLET | Freq: Every day | ORAL | 0 refills | Status: AC
Start: 2020-11-12 — End: 2020-11-17

## 2020-11-12 NOTE — Progress Notes (Signed)
Musculoskeletal Exam  Patient: Margaret Ward DOB: [birth year]  DOS: 11/12/2020  SUBJECTIVE:  Chief Complaint:   Chief Complaint  Patient presents with  . pain on right side    Pt states that pain stated today. Pain goes from hip down her leg.    Margaret Ward is a 79 y.o.  female for evaluation and treatment of R hip pain pain. Here w daughter.   Onset:  1 day ago. No inj; has been working with PT for balance issues moreso.  Location: R hip and low back Character:  aching and burning  Progression of issue:  is unchanged Associated symptoms: no bruising, redness, swelling; slight back pain No bowel/bladder changes. Treatment: to date has been none.   Neurovascular symptoms: no  Past Medical History:  Diagnosis Date  . Anemia   . Arthritis    "hands, arms, all over the place" (03/02/2018)  . Bilateral carpal tunnel syndrome 10/11/2019  . Bronchospasm 03/28/2018  . CAD (coronary artery disease) 02/08/2018   Left heart cath 01/13/18: 40% distal LMCAS 90% proximal LAD  LCF with 90% proximal ramus stenosis, diffuse RCA, 60% distal and 90% proximal PDA stenosis  . CAD in native artery    a. s/p CABG in 01/2018. b. Recurrent CP 02/2018 -> cath showing early graft closure SVG-diagonal, received DES to D1.  Marland Kitchen CAD/s/p CABG Mar 2019 w/ DES to vein graft Apr 2019 05/21/2018  . 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 Carpal tunnel syndrome, bilateral 12/28/2018  . Chest pain 02/05/2018  . Chronic combined systolic and diastolic CHF (congestive heart failure) (HCC)    a. LVEF 40-45% by echo 02/2018.  Marland Kitchen Chronic combined systolic and diastolic heart failure (HCC) 05/21/2018  . Chronic diastolic heart failure (HCC) 03/28/2018  . 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 Chronic hypoxemic respiratory failure (HCC) 05/21/2018  . Chronic lower back pain   . CKD (chronic kidney disease), stage III (HCC)   . Diabetes mellitus type 2 in obese (HCC) 04/23/2018  . GERD (gastroesophageal reflux disease)    . Glass eye    right eye  . Gout    "on daily RX" (03/02/2018)  . Heme positive stool 05/21/2018  . History of kidney stones   . HLD (hyperlipidemia) 05/21/2018  . Hyperlipidemia   . Hypertension   . Hypertensive heart disease   . 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  . Kidney cysts   . Kidney disease   . Left ventricular aneurysm   . Migraine    "used to get them twice/week; haven't had them in a long time" (03/02/2018)  . NSTEMI (non-ST elevated myocardial infarction) (HCC) 03/01/2018  . On home oxygen therapy    "3L at night and prn during the day" (03/02/2018)  . Pneumonia    used to get it twice/year; stopped after I had hysterectomy" (03/02/2018)  . 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 Sinus tachycardia 03/09/2018  . Sleep apnea   . SOB (shortness of breath) 03/09/2018  . Symptomatic anemia 05/21/2018  . Type II diabetes mellitus (HCC)     Objective: VITAL SIGNS: BP 110/66   Pulse 79   Temp 97.9 F (36.6 C) (Oral)   Wt 171 lb 6.4 oz (77.7 kg)   SpO2 97%   BMI 31.35 kg/m  Constitutional: Well formed, well developed. No acute distress. Thorax & Lungs: No accessory muscle use Musculoskeletal: R hip.   Normal active range of motion: yes.   Normal passive range of  motion: yes Tenderness to palpation: yes over distal ES group on R, greater troch and R hip flexor Deformity: no Ecchymosis: no Tests positive: Stinchfield Tests negative: Log roll Neurologic: Normal sensory function.  Psychiatric: Normal mood. Age appropriate judgment and insight. Alert & oriented x 3.    Assessment:  Greater trochanteric bursitis of right hip  Acute right-sided low back pain without sciatica  Strain of flexor muscle of right hip, initial encounter  Plan: Stretches/exercises, heat, ice, Tylenol. 5 d pred burst, 40 mg/d, mind sugars. She has tolerated well in the past. Consider injection in future, declined today F/u prn. The patient and her daughter voiced understanding  and agreement to the plan.   Jilda Roche [place], DO 11/12/20  2:52 PM

## 2020-11-12 NOTE — Patient Instructions (Addendum)
Ice/cold pack over area for 10-15 min twice daily.  Heat (pad or rice pillow in microwave) over affected area, 10-15 minutes twice daily.   OK to take Tylenol 1000 mg (2 extra strength tabs) or 975 mg (3 regular strength tabs) every 6 hours as needed.  Let us know if you need anything.  Iliotibial Band Syndrome Rehab It is normal to feel mild stretching, pulling, tightness, or discomfort as you do these exercises, but you should stop right away if you feel sudden pain or your pain gets worse.  Stretching and range of motion exercises These exercises warm up your muscles and joints and improve the movement and flexibility of your hip and pelvis. Exercise A: Quadriceps, prone    1. Lie on your abdomen on a firm surface, such as a bed or padded floor. 2. Bend your left / right knee and hold your ankle. If you cannot reach your ankle or pant leg, loop a belt around your foot and grab the belt instead. 3. Gently pull your heel toward your buttocks. Your knee should not slide out to the side. You should feel a stretch in the front of your thigh and knee. 4. Hold this position for 30 seconds. Repeat 2 times. Complete this stretch 3 times per week. Exercise B: Iliotibial band    1. Lie on your side with your left / right leg in the top position. 2. Bend both of your knees and grab your left / right ankle. Stretch out your bottom arm to help you balance. 3. Slowly bring your top knee back so your thigh goes behind your trunk. 4. Slowly lower your top leg toward the floor until you feel a gentle stretch on the outside of your left / right hip and thigh. If you do not feel a stretch and your knee will not fall farther, place the heel of your other foot on top of your knee and pull your knee down toward the floor with your foot. 5. Hold this position for 30 seconds. Repeat 2 times. Complete this stretch 3 times per week. Strengthening exercises These exercises build strength and endurance in your  hip and pelvis. Endurance is the ability to use your muscles for a long time, even after they get tired. Exercise C: Straight leg raises (hip abductors)     1. Lie on your side with your left / right leg in the top position. Lie so your head, shoulder, knee, and hip line up. You may bend your bottom knee to help you balance. 2. Roll your hips slightly forward so your hips are stacked directly over each other and your left / right knee is facing forward. 3. Tense the muscles in your outer thigh and lift your top leg 4-6 inches (10-15 cm). 4. Hold this position for 3 seconds. Repeat for a total of 10 reps. 5. Slowly return to the starting position. Let your muscles relax completely before doing another repetition. Repeat 2 times. Complete this exercise 3 times per week. Exercise D: Straight leg raises (hip extensors) 1. Lie on your abdomen on your bed or a firm surface. You can put a pillow under your hips if that is more comfortable. 2. Bend your left / right knee so your foot is straight up in the air. 3. Squeeze your buttock muscles and lift your left / right thigh off the bed. Do not let your back arch. 4. Tense this muscle as hard as you can without increasing any knee pain. 5.  Hold this position for 2 seconds. Repeat for a total of 10 reps 6. Slowly lower your leg to the starting position and allow it to relax completely. Repeat 2 times. Complete this exercise 3 times per week. Exercise E: Hip hike 1. Stand sideways on a bottom step. Stand on your left / right leg with your other foot unsupported next to the step. You can hold onto the railing or wall if needed for balance. 2. Keep your knees straight and your torso square. Then, lift your left / right hip up toward the ceiling. 3. Slowly let your left / right hip lower toward the floor, past the starting position. Your foot should get closer to the floor. Do not lean or bend your knees. Repeat 2 times. Complete this exercise 3 times per  week.  Document Released: 11/03/2005 Document Revised: 07/08/2016 Document Reviewed: 10/05/2015 [employer] Interactive Patient Education  2018 [employer]    Hip Exercises It is normal to feel mild stretching, pulling, tightness, or discomfort as you do these exercises, but you should stop right away if you feel sudden pain or your pain gets worse.  STRETCHING AND RANGE OF MOTION EXERCISES These exercises warm up your muscles and joints and improve the movement and flexibility of your hip. These exercises also help to relieve pain, numbness, and tingling. Exercise A: Hamstrings, Supine  1. Lie on your back. 2. Loop a belt or towel over the ball of your left / rightfoot. The ball of your foot is on the walking surface, right under your toes. 3. Straighten your left / rightknee and slowly pull on the belt to raise your leg. ? Do not let your left / right knee bend while you do this. ? Keep your other leg flat on the floor. ? Raise the left / right leg until you feel a gentle stretch behind your left / right knee or thigh. 4. Hold this position for 30 seconds. 5. Slowly return your leg to the starting position. Repeat2 times. Complete this stretch 3 times per week. Exercise B: Hip Rotators  1. Lie on your back on a firm surface. 2. Hold your left / right knee with your left / right hand. Hold your ankle with your other hand. 3. Gently pull your left / right knee and rotate your lower leg toward your other shoulder. ? Pull until you feel a stretch in your buttocks. ? Keep your hips and shoulders firmly planted while you do this stretch. 4. Hold this position for 30 seconds. Repeat 2 times. Complete this stretch 3 times per week. Exercise C: V-Sit (Hamstrings and Adductors)  1. Sit on the floor with your legs extended in a large "V" shape. Keep your knees straight during this exercise. 2. Start with your head and chest upright, then bend at your waist to reach for your left foot  (position A). You should feel a stretch in your right inner thigh. 3. Hold this position for 30 seconds. Then slowly return to the upright position. 4. Bend at your waist to reach forward (position B). You should feel a stretch behind both of your thighs and knees. 5. Hold this position for 30 seconds. Then slowly return to the upright position. 6. Bend at your waist to reach for your right foot (position C). You should feel a stretch in your left inner thigh. 7. Hold this position for 30 seconds. Then slowly return to the upright position. Repeat A, B, and C 2 times each. Complete this stretch  3 times per week. Exercise D: Lunge (Hip Flexors)  1. Place your left / right knee on the floor and bend your other knee so that is directly over your ankle. You should be half-kneeling. 2. Keep good posture with your head over your shoulders. 3. Tighten your buttocks to point your tailbone downward. This helps your back to keep from arching too much. 4. You should feel a gentle stretch in the front of your left / right thigh and hip. If you do not feel any resistance, slightly slide your other foot forward and then slowly lunge forward so your knee once again lines up over your ankle. 5. Make sure your tailbone continues to point downward. 6. Hold this position for 30 seconds. Repeat 2 times. Complete this stretch 3 times per week.  STRENGTHENING EXERCISES These exercises build strength and endurance in your hip. Endurance is the ability to use your muscles for a long time, even after they get tired. Exercise E: Bridge (Hip Extensors)  1. Lie on your back on a firm surface with your knees bent and your feet flat on the floor. 2. Tighten your buttocks muscles and lift your bottom off the floor until the trunk of your body is level with your thighs. ? Do not arch your back. ? You should feel the muscles working in your buttocks and the back of your thighs. If you do not feel these muscles, slide your  feet 1-2 inches (2.5-5 cm) farther away from your buttocks. 3. Hold this position for 3 seconds. 4. Slowly lower your hips to the starting position. Repeat for a total of 10 repetitions. 5. Let your muscles relax completely between repetitions. 6. If this exercise is too easy, try doing it with your arms crossed over your chest. Repeat 2 times. Complete this exercise 3 times per week. Exercise F: Straight Leg Raises - Hip Abductors  1. Lie on your side with your left / right leg in the top position. Lie so your head, shoulder, knee, and hip line up with each other. You may bend your bottom knee to help you balance. 2. Roll your hips slightly forward, so your hips are stacked directly over each other and your left / right knee is facing forward. 3. Leading with your heel, lift your top leg 4-6 inches (10-15 cm). You should feel the muscles in your outer hip lifting. ? Do not let your foot drift forward. ? Do not let your knee roll toward the ceiling. 4. Hold this position for 1 second. 5. Slowly return to the starting position. 6. Let your muscles relax completely between repetitions. Repeat for a total of 10 repetitions.  Repeat 2 times. Complete this exercise 3 times per week. Exercise G: Straight Leg Raises - Hip Adductors  1. Lie on your side with your left / right leg in the bottom position. Lie so your head, shoulder, knee, and hip line up. You may place your upper foot in front to help you balance. 2. Roll your hips slightly forward, so your hips are stacked directly over each other and your left / right knee is facing forward. 3. Tense the muscles in your inner thigh and lift your bottom leg 4-6 inches (10-15 cm). 4. Hold this position for 1 second. 5. Slowly return to the starting position. 6. Let your muscles relax completely between repetitions. Repeat for a total of 10 repetitions. Repeat 2 times. Complete this exercise 3 times per week. Exercise H: Straight Leg Raises -  Quadriceps  1. Lie on your back with your left / right leg extended and your other knee bent. 2. Tense the muscles in the front of your left / right thigh. When you do this, you should see your kneecap slide up or see increased dimpling just above your knee. 3. Tighten these muscles even more and raise your leg 4-6 inches (10-15 cm) off the floor. 4. Hold this position for 3 seconds. 5. Keep these muscles tense as you lower your leg. 6. Relax the muscles slowly and completely between repetitions. Repeat for a total of 10 repetitions. Repeat 2 times. Complete this exercise 3 times per week. Exercise I: Hip Abductors, Standing 1. Tie one end of a rubber exercise band or tubing to a secure surface, such as a table or pole. 2. Loop the other end of the band or tubing around your left / right ankle. 3. Keeping your ankle with the band or tubing directly opposite of the secured end, step away until there is tension in the tubing or band. Hold onto a chair as needed for balance. 4. Lift your left / right leg out to your side. While you do this: ? Keep your back upright. ? Keep your shoulders over your hips. ? Keep your toes pointing forward. ? Make sure to use your hip muscles to lift your leg. Do not "throw" your leg or tip your body to lift your leg. 5. Hold this position for 1 second. 6. Slowly return to the starting position. Repeat for a total of 10 repetitions. Repeat 2 times. Complete this exercise 3 times per week. Exercise J: Squats (Quadriceps) 1. Stand in a door frame so your feet and knees are in line with the frame. You may place your hands on the frame for balance. 2. Slowly bend your knees and lower your hips like you are going to sit in a chair. ? Keep your lower legs in a straight-up-and-down position. ? Do not let your hips go lower than your knees. ? Do not bend your knees lower than told by your health care provider. ? If your hip pain increases, do not bend as low. 3. Hold  this position for 1 second. 4. Slowly push with your legs to return to standing. Do not use your hands to pull yourself to standing. Repeat for a total of 10 repetitions. Repeat 2 times. Complete this exercise 3 times per week. Make sure you discuss any questions you have with your health care provider. Document Released: 11/21/2005 Document Revised: 07/28/2016 Document Reviewed: 10/29/2015 [employer] Interactive Patient Education  2018 [employer]  EXERCISES  RANGE OF MOTION (ROM) AND STRETCHING EXERCISES - Low Back Pain Most people with lower back pain will find that their symptoms get worse with excessive bending forward (flexion) or arching at the lower back (extension). The exercises that will help resolve your symptoms will focus on the opposite motion.  If you have pain, numbness or tingling which travels down into your buttocks, leg or foot, the goal of the therapy is for these symptoms to move closer to your back and eventually resolve. Sometimes, these leg symptoms will get better, but your lower back pain may worsen. This is often an indication of progress in your rehabilitation. Be very alert to any changes in your symptoms and the activities in which you participated in the 24 hours prior to the change. Sharing this information with your caregiver will allow him or her to most efficiently treat your condition. These exercises  may help you when beginning to rehabilitate your injury. Your symptoms may resolve with or without further involvement from your physician, physical therapist or athletic trainer. While completing these exercises, remember:   Restoring tissue flexibility helps normal motion to return to the joints. This allows healthier, less painful movement and activity.  An effective stretch should be held for at least 30 seconds.  A stretch should never be painful. You should only feel a gentle lengthening or release in the stretched tissue. FLEXION RANGE OF MOTION AND  STRETCHING EXERCISES:  STRETCH - Flexion, Single Knee to Chest   Lie on a firm bed or floor with both legs extended in front of you.  Keeping one leg in contact with the floor, bring your opposite knee to your chest. Hold your leg in place by either grabbing behind your thigh or at your knee.  Pull until you feel a gentle stretch in your low back. Hold 30 seconds.  Slowly release your grasp and repeat the exercise with the opposite side. Repeat 2 times. Complete this exercise 3 times per week.   STRETCH - Flexion, Double Knee to Chest  Lie on a firm bed or floor with both legs extended in front of you.  Keeping one leg in contact with the floor, bring your opposite knee to your chest.  Tense your stomach muscles to support your back and then lift your other knee to your chest. Hold your legs in place by either grabbing behind your thighs or at your knees.  Pull both knees toward your chest until you feel a gentle stretch in your low back. Hold 30 seconds.  Tense your stomach muscles and slowly return one leg at a time to the floor. Repeat 2 times. Complete this exercise 3 times per week.   STRETCH - Low Trunk Rotation  Lie on a firm bed or floor. Keeping your legs in front of you, bend your knees so they are both pointed toward the ceiling and your feet are flat on the floor.  Extend your arms out to the side. This will stabilize your upper body by keeping your shoulders in contact with the floor.  Gently and slowly drop both knees together to one side until you feel a gentle stretch in your low back. Hold for 30 seconds.  Tense your stomach muscles to support your lower back as you bring your knees back to the starting position. Repeat the exercise to the other side. Repeat 2 times. Complete this exercise at least 3 times per week.   EXTENSION RANGE OF MOTION AND FLEXIBILITY EXERCISES:  STRETCH - Extension, Prone on Elbows   Lie on your stomach on the floor, a bed will be  too soft. Place your palms about shoulder width apart and at the height of your head.  Place your elbows under your shoulders. If this is too painful, stack pillows under your chest.  Allow your body to relax so that your hips drop lower and make contact more completely with the floor.  Hold this position for 30 seconds.  Slowly return to lying flat on the floor. Repeat 2 times. Complete this exercise 3 times per week.   RANGE OF MOTION - Extension, Prone Press Ups  Lie on your stomach on the floor, a bed will be too soft. Place your palms about shoulder width apart and at the height of your head.  Keeping your back as relaxed as possible, slowly straighten your elbows while keeping your hips on the floor.  You may adjust the placement of your hands to maximize your comfort. As you gain motion, your hands will come more underneath your shoulders.  Hold this position 30 seconds.  Slowly return to lying flat on the floor. Repeat 2 times. Complete this exercise 3 times per week.   RANGE OF MOTION- Quadruped, Neutral Spine   Assume a hands and knees position on a firm surface. Keep your hands under your shoulders and your knees under your hips. You may place padding under your knees for comfort.  Drop your head and point your tailbone toward the ground below you. This will round out your lower back like an angry cat. Hold this position for 30 seconds.  Slowly lift your head and release your tail bone so that your back sags into a large arch, like an old horse.  Hold this position for 30 seconds.  Repeat this until you feel limber in your low back.  Now, find your "sweet spot." This will be the most comfortable position somewhere between the two previous positions. This is your neutral spine. Once you have found this position, tense your stomach muscles to support your low back.  Hold this position for 30 seconds. Repeat 2 times. Complete this exercise 3 times per week.    STRENGTHENING EXERCISES - Low Back Sprain These exercises may help you when beginning to rehabilitate your injury. These exercises should be done near your "sweet spot." This is the neutral, low-back arch, somewhere between fully rounded and fully arched, that is your least painful position. When performed in this safe range of motion, these exercises can be used for people who have either a flexion or extension based injury. These exercises may resolve your symptoms with or without further involvement from your physician, physical therapist or athletic trainer. While completing these exercises, remember:   Muscles can gain both the endurance and the strength needed for everyday activities through controlled exercises.  Complete these exercises as instructed by your physician, physical therapist or athletic trainer. Increase the resistance and repetitions only as guided.  You may experience muscle soreness or fatigue, but the pain or discomfort you are trying to eliminate should never worsen during these exercises. If this pain does worsen, stop and make certain you are following the directions exactly. If the pain is still present after adjustments, discontinue the exercise until you can discuss the trouble with your caregiver.  STRENGTHENING - Deep Abdominals, Pelvic Tilt   Lie on a firm bed or floor. Keeping your legs in front of you, bend your knees so they are both pointed toward the ceiling and your feet are flat on the floor.  Tense your lower abdominal muscles to press your low back into the floor. This motion will rotate your pelvis so that your tail bone is scooping upwards rather than pointing at your feet or into the floor. With a gentle tension and even breathing, hold this position for 3 seconds. Repeat 2 times. Complete this exercise 3 times per week.   STRENGTHENING - Abdominals, Crunches   Lie on a firm bed or floor. Keeping your legs in front of you, bend your knees so they  are both pointed toward the ceiling and your feet are flat on the floor. Cross your arms over your chest.  Slightly tip your chin down without bending your neck.  Tense your abdominals and slowly lift your trunk high enough to just clear your shoulder blades. Lifting higher can put excessive stress on the lower back  and does not further strengthen your abdominal muscles.  Control your return to the starting position. Repeat 2 times. Complete this exercise 3 times per week.   STRENGTHENING - Quadruped, Opposite UE/LE Lift   Assume a hands and knees position on a firm surface. Keep your hands under your shoulders and your knees under your hips. You may place padding under your knees for comfort.  Find your neutral spine and gently tense your abdominal muscles so that you can maintain this position. Your shoulders and hips should form a rectangle that is parallel with the floor and is not twisted.  Keeping your trunk steady, lift your right hand no higher than your shoulder and then your left leg no higher than your hip. Make sure you are not holding your breath. Hold this position for 30 seconds.  Continuing to keep your abdominal muscles tense and your back steady, slowly return to your starting position. Repeat with the opposite arm and leg. Repeat 2 times. Complete this exercise 3 times per week.   STRENGTHENING - Abdominals and Quadriceps, Straight Leg Raise   Lie on a firm bed or floor with both legs extended in front of you.  Keeping one leg in contact with the floor, bend the other knee so that your foot can rest flat on the floor.  Find your neutral spine, and tense your abdominal muscles to maintain your spinal position throughout the exercise.  Slowly lift your straight leg off the floor about 6 inches for a count of 3, making sure to not hold your breath.  Still keeping your neutral spine, slowly lower your leg all the way to the floor. Repeat this exercise with each leg 2  times. Complete this exercise 3 times per week.  POSTURE AND BODY MECHANICS CONSIDERATIONS - Low Back Sprain Keeping correct posture when sitting, standing or completing your activities will reduce the stress put on different body tissues, allowing injured tissues a chance to heal and limiting painful experiences. The following are general guidelines for improved posture.  While reading these guidelines, remember:  The exercises prescribed by your provider will help you have the flexibility and strength to maintain correct postures.  The correct posture provides the best environment for your joints to work. All of your joints have less wear and tear when properly supported by a spine with good posture. This means you will experience a healthier, less painful body.  Correct posture must be practiced with all of your activities, especially prolonged sitting and standing. Correct posture is as important when doing repetitive low-stress activities (typing) as it is when doing a single heavy-load activity (lifting).  RESTING POSITIONS Consider which positions are most painful for you when choosing a resting position. If you have pain with flexion-based activities (sitting, bending, stooping, squatting), choose a position that allows you to rest in a less flexed posture. You would want to avoid curling into a fetal position on your side. If your pain worsens with extension-based activities (prolonged standing, working overhead), avoid resting in an extended position such as sleeping on your stomach. Most people will find more comfort when they rest with their spine in a more neutral position, neither too rounded nor too arched. Lying on a non-sagging bed on your side with a pillow between your knees, or on your back with a pillow under your knees will often provide some relief. Keep in mind, being in any one position for a prolonged period of time, no matter how correct your posture, can  still lead to  stiffness.  PROPER SITTING POSTURE In order to minimize stress and discomfort on your spine, you must sit with correct posture. Sitting with good posture should be effortless for a healthy body. Returning to good posture is a gradual process. Many people can work toward this most comfortably by using various supports until they have the flexibility and strength to maintain this posture on their own. When sitting with proper posture, your ears will fall over your shoulders and your shoulders will fall over your hips. You should use the back of the chair to support your upper back. Your lower back will be in a neutral position, just slightly arched. You may place a small pillow or folded towel at the base of your lower back for  support.  When working at a desk, create an environment that supports good, upright posture. Without extra support, muscles tire, which leads to excessive strain on joints and other tissues. Keep these recommendations in mind:  CHAIR:  A chair should be able to slide under your desk when your back makes contact with the back of the chair. This allows you to work closely.  The chair's height should allow your eyes to be level with the upper part of your monitor and your hands to be slightly lower than your elbows.  BODY POSITION  Your feet should make contact with the floor. If this is not possible, use a foot rest.  Keep your ears over your shoulders. This will reduce stress on your neck and low back.  INCORRECT SITTING POSTURES  If you are feeling tired and unable to assume a healthy sitting posture, do not slouch or slump. This puts excessive strain on your back tissues, causing more damage and pain. Healthier options include:  Using more support, like a lumbar pillow.  Switching tasks to something that requires you to be upright or walking.  Talking a brief walk.  Lying down to rest in a neutral-spine position.  PROLONGED STANDING WHILE SLIGHTLY LEANING  FORWARD  When completing a task that requires you to lean forward while standing in one place for a long time, place either foot up on a stationary 2-4 inch high object to help maintain the best posture. When both feet are on the ground, the lower back tends to lose its slight inward curve. If this curve flattens (or becomes too large), then the back and your other joints will experience too much stress, tire more quickly, and can cause pain.  CORRECT STANDING POSTURES Proper standing posture should be assumed with all daily activities, even if they only take a few moments, like when brushing your teeth. As in sitting, your ears should fall over your shoulders and your shoulders should fall over your hips. You should keep a slight tension in your abdominal muscles to brace your spine. Your tailbone should point down to the ground, not behind your body, resulting in an over-extended swayback posture.   INCORRECT STANDING POSTURES  Common incorrect standing postures include a forward head, locked knees and/or an excessive swayback. WALKING Walk with an upright posture. Your ears, shoulders and hips should all line-up.  PROLONGED ACTIVITY IN A FLEXED POSITION When completing a task that requires you to bend forward at your waist or lean over a low surface, try to find a way to stabilize 3 out of 4 of your limbs. You can place a hand or elbow on your thigh or rest a knee on the surface you are reaching across. This  will provide you more stability, so that your muscles do not tire as quickly. By keeping your knees relaxed, or slightly bent, you will also reduce stress across your lower back. CORRECT LIFTING TECHNIQUES  DO :  Assume a wide stance. This will provide you more stability and the opportunity to get as close as possible to the object which you are lifting.  Tense your abdominals to brace your spine. Bend at the knees and hips. Keeping your back locked in a neutral-spine position, lift using  your leg muscles. Lift with your legs, keeping your back straight.  Test the weight of unknown objects before attempting to lift them.  Try to keep your elbows locked down at your sides in order get the best strength from your shoulders when carrying an object.     Always ask for help when lifting heavy or awkward objects. INCORRECT LIFTING TECHNIQUES DO NOT:   Lock your knees when lifting, even if it is a small object.  Bend and twist. Pivot at your feet or move your feet when needing to change directions.  Assume that you can safely pick up even a paperclip without proper posture.

## 2020-11-13 DIAGNOSIS — N183 Chronic kidney disease, stage 3 unspecified: Secondary | ICD-10-CM | POA: Diagnosis not present

## 2020-11-13 DIAGNOSIS — M150 Primary generalized (osteo)arthritis: Secondary | ICD-10-CM | POA: Diagnosis not present

## 2020-11-13 DIAGNOSIS — I130 Hypertensive heart and chronic kidney disease with heart failure and stage 1 through stage 4 chronic kidney disease, or unspecified chronic kidney disease: Secondary | ICD-10-CM | POA: Diagnosis not present

## 2020-11-13 DIAGNOSIS — G8929 Other chronic pain: Secondary | ICD-10-CM | POA: Diagnosis not present

## 2020-11-13 DIAGNOSIS — I5040 Unspecified combined systolic (congestive) and diastolic (congestive) heart failure: Secondary | ICD-10-CM | POA: Diagnosis not present

## 2020-11-13 DIAGNOSIS — E1122 Type 2 diabetes mellitus with diabetic chronic kidney disease: Secondary | ICD-10-CM | POA: Diagnosis not present

## 2020-12-03 DIAGNOSIS — M150 Primary generalized (osteo)arthritis: Secondary | ICD-10-CM | POA: Diagnosis not present

## 2020-12-03 DIAGNOSIS — Z9981 Dependence on supplemental oxygen: Secondary | ICD-10-CM | POA: Diagnosis not present

## 2020-12-03 DIAGNOSIS — M545 Low back pain, unspecified: Secondary | ICD-10-CM | POA: Diagnosis not present

## 2020-12-03 DIAGNOSIS — I2510 Atherosclerotic heart disease of native coronary artery without angina pectoris: Secondary | ICD-10-CM | POA: Diagnosis not present

## 2020-12-03 DIAGNOSIS — E785 Hyperlipidemia, unspecified: Secondary | ICD-10-CM | POA: Diagnosis not present

## 2020-12-03 DIAGNOSIS — J9611 Chronic respiratory failure with hypoxia: Secondary | ICD-10-CM | POA: Diagnosis not present

## 2020-12-03 DIAGNOSIS — D649 Anemia, unspecified: Secondary | ICD-10-CM | POA: Diagnosis not present

## 2020-12-03 DIAGNOSIS — I5040 Unspecified combined systolic (congestive) and diastolic (congestive) heart failure: Secondary | ICD-10-CM | POA: Diagnosis not present

## 2020-12-03 DIAGNOSIS — N183 Chronic kidney disease, stage 3 unspecified: Secondary | ICD-10-CM | POA: Diagnosis not present

## 2020-12-03 DIAGNOSIS — G4730 Sleep apnea, unspecified: Secondary | ICD-10-CM | POA: Diagnosis not present

## 2020-12-03 DIAGNOSIS — G8929 Other chronic pain: Secondary | ICD-10-CM | POA: Diagnosis not present

## 2020-12-03 DIAGNOSIS — K219 Gastro-esophageal reflux disease without esophagitis: Secondary | ICD-10-CM | POA: Diagnosis not present

## 2020-12-03 DIAGNOSIS — I130 Hypertensive heart and chronic kidney disease with heart failure and stage 1 through stage 4 chronic kidney disease, or unspecified chronic kidney disease: Secondary | ICD-10-CM | POA: Diagnosis not present

## 2020-12-03 DIAGNOSIS — E1122 Type 2 diabetes mellitus with diabetic chronic kidney disease: Secondary | ICD-10-CM | POA: Diagnosis not present

## 2020-12-06 DIAGNOSIS — E119 Type 2 diabetes mellitus without complications: Secondary | ICD-10-CM | POA: Diagnosis not present

## 2020-12-06 DIAGNOSIS — H524 Presbyopia: Secondary | ICD-10-CM | POA: Diagnosis not present

## 2020-12-06 DIAGNOSIS — H52222 Regular astigmatism, left eye: Secondary | ICD-10-CM | POA: Diagnosis not present

## 2020-12-06 DIAGNOSIS — Z970 Presence of artificial eye: Secondary | ICD-10-CM | POA: Diagnosis not present

## 2020-12-06 DIAGNOSIS — Z961 Presence of intraocular lens: Secondary | ICD-10-CM | POA: Diagnosis not present

## 2020-12-06 LAB — HM DIABETES EYE EXAM

## 2020-12-12 DIAGNOSIS — M150 Primary generalized (osteo)arthritis: Secondary | ICD-10-CM | POA: Diagnosis not present

## 2020-12-12 DIAGNOSIS — I130 Hypertensive heart and chronic kidney disease with heart failure and stage 1 through stage 4 chronic kidney disease, or unspecified chronic kidney disease: Secondary | ICD-10-CM | POA: Diagnosis not present

## 2020-12-12 DIAGNOSIS — N183 Chronic kidney disease, stage 3 unspecified: Secondary | ICD-10-CM | POA: Diagnosis not present

## 2020-12-12 DIAGNOSIS — G8929 Other chronic pain: Secondary | ICD-10-CM | POA: Diagnosis not present

## 2020-12-12 DIAGNOSIS — I5040 Unspecified combined systolic (congestive) and diastolic (congestive) heart failure: Secondary | ICD-10-CM | POA: Diagnosis not present

## 2020-12-12 DIAGNOSIS — E1122 Type 2 diabetes mellitus with diabetic chronic kidney disease: Secondary | ICD-10-CM | POA: Diagnosis not present

## 2020-12-17 DIAGNOSIS — I130 Hypertensive heart and chronic kidney disease with heart failure and stage 1 through stage 4 chronic kidney disease, or unspecified chronic kidney disease: Secondary | ICD-10-CM | POA: Diagnosis not present

## 2020-12-17 DIAGNOSIS — I5040 Unspecified combined systolic (congestive) and diastolic (congestive) heart failure: Secondary | ICD-10-CM | POA: Diagnosis not present

## 2020-12-17 DIAGNOSIS — E1122 Type 2 diabetes mellitus with diabetic chronic kidney disease: Secondary | ICD-10-CM | POA: Diagnosis not present

## 2020-12-17 DIAGNOSIS — M150 Primary generalized (osteo)arthritis: Secondary | ICD-10-CM | POA: Diagnosis not present

## 2020-12-17 DIAGNOSIS — N183 Chronic kidney disease, stage 3 unspecified: Secondary | ICD-10-CM | POA: Diagnosis not present

## 2020-12-17 DIAGNOSIS — G8929 Other chronic pain: Secondary | ICD-10-CM | POA: Diagnosis not present

## 2020-12-19 ENCOUNTER — Other Ambulatory Visit: Payer: Self-pay | Admitting: Family Medicine

## 2020-12-24 ENCOUNTER — Other Ambulatory Visit: Payer: Self-pay | Admitting: Family Medicine

## 2020-12-24 ENCOUNTER — Other Ambulatory Visit: Payer: Self-pay | Admitting: Cardiology

## 2020-12-25 DIAGNOSIS — G8929 Other chronic pain: Secondary | ICD-10-CM | POA: Diagnosis not present

## 2020-12-25 DIAGNOSIS — M150 Primary generalized (osteo)arthritis: Secondary | ICD-10-CM | POA: Diagnosis not present

## 2020-12-25 DIAGNOSIS — I130 Hypertensive heart and chronic kidney disease with heart failure and stage 1 through stage 4 chronic kidney disease, or unspecified chronic kidney disease: Secondary | ICD-10-CM | POA: Diagnosis not present

## 2020-12-25 DIAGNOSIS — I5040 Unspecified combined systolic (congestive) and diastolic (congestive) heart failure: Secondary | ICD-10-CM | POA: Diagnosis not present

## 2020-12-25 DIAGNOSIS — N183 Chronic kidney disease, stage 3 unspecified: Secondary | ICD-10-CM | POA: Diagnosis not present

## 2020-12-25 DIAGNOSIS — E1122 Type 2 diabetes mellitus with diabetic chronic kidney disease: Secondary | ICD-10-CM | POA: Diagnosis not present

## 2020-12-26 ENCOUNTER — Other Ambulatory Visit: Payer: Self-pay | Admitting: Cardiology

## 2020-12-26 DIAGNOSIS — I110 Hypertensive heart disease with heart failure: Secondary | ICD-10-CM

## 2020-12-26 DIAGNOSIS — I5032 Chronic diastolic (congestive) heart failure: Secondary | ICD-10-CM

## 2021-01-23 ENCOUNTER — Other Ambulatory Visit: Payer: Self-pay

## 2021-01-23 ENCOUNTER — Encounter: Payer: Self-pay | Admitting: Family Medicine

## 2021-01-23 ENCOUNTER — Ambulatory Visit (INDEPENDENT_AMBULATORY_CARE_PROVIDER_SITE_OTHER): Payer: Medicare Other | Admitting: Family Medicine

## 2021-01-23 VITALS — BP 128/66 | HR 72 | Temp 97.8°F | Resp 16 | Wt 168.0 lb

## 2021-01-23 DIAGNOSIS — E1165 Type 2 diabetes mellitus with hyperglycemia: Secondary | ICD-10-CM | POA: Diagnosis not present

## 2021-01-23 LAB — HEMOGLOBIN A1C: Hgb A1c MFr Bld: 7.7 % — ABNORMAL HIGH (ref 4.6–6.5)

## 2021-01-23 NOTE — Progress Notes (Signed)
Subjective:   Chief Complaint  Patient presents with  . Follow-up  . Diabetes    173 this morning    Margaret Ward is a 80 y.o. female here for follow-up of diabetes.  Here w daughter.  Latarra's self monitored glucose range is 160-170's.  Patient denies hypoglycemic reactions. She checks her glucose levels 1 time(s) per day. Patient does require insulin.   Medications include: Metformin XR 500 mg bid Diet is healty.  Exercise: working w PT, HEP No CP or SOB.   Past Medical History:  Diagnosis Date  . Anemia   . Arthritis    "hands, arms, all over the place" (03/02/2018)  . Bilateral carpal tunnel syndrome 10/11/2019  . Bronchospasm 03/28/2018  . CAD (coronary artery disease) 02/08/2018   Left heart cath 01/13/18: 40% distal LMCAS 90% proximal LAD  LCF with 90% proximal ramus stenosis, diffuse RCA, 60% distal and 90% proximal PDA stenosis  . CAD in native artery    a. s/p CABG in 01/2018. b. Recurrent CP 02/2018 -> cath showing early graft closure SVG-diagonal, received DES to D1.  Marland Kitchen CAD/s/p CABG Mar 2019 w/ DES to vein graft Apr 2019 05/21/2018  . 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 Carpal tunnel syndrome, bilateral 12/28/2018  . Chest pain 02/05/2018  . Chronic combined systolic and diastolic CHF (congestive heart failure) (HCC)    a. LVEF 40-45% by echo 02/2018.  Marland Kitchen Chronic combined systolic and diastolic heart failure (HCC) 05/21/2018  . Chronic diastolic heart failure (HCC) 03/28/2018  . 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 Chronic hypoxemic respiratory failure (HCC) 05/21/2018  . Chronic lower back pain   . CKD (chronic kidney disease), stage III (HCC)   . Diabetes mellitus type 2 in obese (HCC) 04/23/2018  . GERD (gastroesophageal reflux disease)   . Glass eye    right eye  . Gout    "on daily RX" (03/02/2018)  . Heme positive stool 05/21/2018  . History of kidney stones   . HLD (hyperlipidemia) 05/21/2018  . Hyperlipidemia   . Hypertension   . Hypertensive heart  disease   . 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  . Kidney cysts   . Kidney disease   . Left ventricular aneurysm   . Migraine    "used to get them twice/week; haven't had them in a long time" (03/02/2018)  . NSTEMI (non-ST elevated myocardial infarction) (HCC) 03/01/2018  . On home oxygen therapy    "3L at night and prn during the day" (03/02/2018)  . Pneumonia    used to get it twice/year; stopped after I had hysterectomy" (03/02/2018)  . 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 Sinus tachycardia 03/09/2018  . Sleep apnea   . SOB (shortness of breath) 03/09/2018  . Symptomatic anemia 05/21/2018  . Type II diabetes mellitus (HCC)      Related testing: Retinal exam: Done Pneumovax: done  Objective:  BP 128/66   Pulse 72   Temp 97.8 F (36.6 C)   Resp 16   Wt 168 lb (76.2 kg)   SpO2 97%   BMI 30.73 kg/m  General:  Well developed, well nourished, in no apparent distress Skin:  Warm, no pallor or diaphoresis Lungs:  CTAB, no access msc use Cardio:  RRR, no LE edema Psych: Age appropriate judgment and insight  Assessment:   Type 2 diabetes mellitus with hyperglycemia, without long-term current use of insulin (HCC) - Plan: Hemoglobin A1c   Plan:   Ck above.  Metformin XR 500 mg bid for now to cont, await results. A1c goal is <8.5. Counseled on diet and exercise. Covid booster rec'd.   F/u in 3-6 mo. The patient and her daughter voiced understanding and agreement to the plan.  Jilda Roche [place], DO 01/23/21 9:23 AM

## 2021-01-23 NOTE — Patient Instructions (Signed)
Give [place] 2-3 business days to get the results of your labs back.   Continue the metformin for now. We may need to increase the dosage.  Keep the diet clean and stay active.  Let us know if you need anything.

## 2021-02-05 DIAGNOSIS — Z862 Personal history of diseases of the blood and blood-forming organs and certain disorders involving the immune mechanism: Secondary | ICD-10-CM

## 2021-02-05 DIAGNOSIS — Z9049 Acquired absence of other specified parts of digestive tract: Secondary | ICD-10-CM

## 2021-02-05 DIAGNOSIS — D500 Iron deficiency anemia secondary to blood loss (chronic): Secondary | ICD-10-CM | POA: Insufficient documentation

## 2021-02-05 HISTORY — DX: Personal history of diseases of the blood and blood-forming organs and certain disorders involving the immune mechanism: Z86.2

## 2021-02-05 HISTORY — DX: Iron deficiency anemia secondary to blood loss (chronic): D50.0

## 2021-02-05 HISTORY — DX: Acquired absence of other specified parts of digestive tract: Z90.49

## 2021-02-06 ENCOUNTER — Other Ambulatory Visit: Payer: Self-pay

## 2021-02-06 ENCOUNTER — Ambulatory Visit (INDEPENDENT_AMBULATORY_CARE_PROVIDER_SITE_OTHER): Payer: Medicare Other | Admitting: Cardiology

## 2021-02-06 ENCOUNTER — Encounter: Payer: Self-pay | Admitting: Cardiology

## 2021-02-06 VITALS — BP 118/62 | HR 76 | Ht 62.0 in | Wt 166.0 lb

## 2021-02-06 DIAGNOSIS — I10 Essential (primary) hypertension: Secondary | ICD-10-CM | POA: Diagnosis not present

## 2021-02-06 DIAGNOSIS — I519 Heart disease, unspecified: Secondary | ICD-10-CM | POA: Diagnosis not present

## 2021-02-06 DIAGNOSIS — N1831 Chronic kidney disease, stage 3a: Secondary | ICD-10-CM | POA: Diagnosis not present

## 2021-02-06 DIAGNOSIS — E782 Mixed hyperlipidemia: Secondary | ICD-10-CM

## 2021-02-06 DIAGNOSIS — I110 Hypertensive heart disease with heart failure: Secondary | ICD-10-CM

## 2021-02-06 DIAGNOSIS — I255 Ischemic cardiomyopathy: Secondary | ICD-10-CM | POA: Diagnosis not present

## 2021-02-06 NOTE — Patient Instructions (Signed)
Medication Instructions:  Your physician recommends that you continue on your current medications as directed. Please refer to the Current Medication list given to you today.  *If you need a refill on your cardiac medications before your next appointment, please call your pharmacy*   Lab Work: None If you have labs (blood work) drawn today and your tests are completely normal, you will receive your results only by: . MyChart Message (if you have MyChart) OR . A paper copy in the mail If you have any lab test that is abnormal or we need to change your treatment, we will call you to review the results.   Testing/Procedures: Your physician has requested that you have an echocardiogram. Echocardiography is a painless test that uses sound waves to create images of your heart. It provides your doctor with information about the size and shape of your heart and how well your heart's chambers and valves are working. This procedure takes approximately one hour. There are no restrictions for this procedure.   Follow-Up: At [facility], you and your health needs are our priority.  As part of our continuing mission to provide you with exceptional heart care, we have created designated Provider Care Teams.  These Care Teams include your primary Cardiologist (physician) and Advanced Practice Providers (APPs -  Physician Assistants and Nurse Practitioners) who all work together to provide you with the care you need, when you need it.  We recommend signing up for the patient portal called "MyChart".  Sign up information is provided on this After Visit Summary.  MyChart is used to connect with patients for Virtual Visits (Telemedicine).  Patients are able to view lab/test results, encounter notes, upcoming appointments, etc.  Non-urgent messages can be sent to your provider as well.   To learn more about what you can do with MyChart, go to [email or web address].    Your next appointment:   6  month(s)  The format for your next appointment:   In Person  Provider:   Kardie Tobb, DO   Other Instructions   Echocardiogram An echocardiogram is a test that uses sound waves (ultrasound) to produce images of the heart. Images from an echocardiogram can provide important information about:  Heart size and shape.  The size and thickness and movement of your heart's walls.  Heart muscle function and strength.  Heart valve function or if you have stenosis. Stenosis is when the heart valves are too narrow.  If blood is flowing backward through the heart valves (regurgitation).  A tumor or infectious growth around the heart valves.  Areas of heart muscle that are not working well because of poor blood flow or injury from a heart attack.  Aneurysm detection. An aneurysm is a weak or damaged part of an artery wall. The wall bulges out from the normal force of blood pumping through the body. Tell a health care provider about:  Any allergies you have.  All medicines you are taking, including vitamins, herbs, eye drops, creams, and over-the-counter medicines.  Any blood disorders you have.  Any surgeries you have had.  Any medical conditions you have.  Whether you are pregnant or may be pregnant. What are the risks? Generally, this is a safe test. However, problems may occur, including an allergic reaction to dye (contrast) that may be used during the test. What happens before the test? No specific preparation is needed. You may eat and drink normally. What happens during the test?  You will take off your clothes from   the waist up and put on a hospital gown.  Electrodes or electrocardiogram (ECG)patches may be placed on your chest. The electrodes or patches are then connected to a device that monitors your heart rate and rhythm.  You will lie down on a table for an ultrasound exam. A gel will be applied to your chest to help sound waves pass through your skin.  A  handheld device, called a transducer, will be pressed against your chest and moved over your heart. The transducer produces sound waves that travel to your heart and bounce back (or "echo" back) to the transducer. These sound waves will be captured in real-time and changed into images of your heart that can be viewed on a video monitor. The images will be recorded on a computer and reviewed by your health care provider.  You may be asked to change positions or hold your breath for a short time. This makes it easier to get different views or better views of your heart.  In some cases, you may receive contrast through an IV in one of your veins. This can improve the quality of the pictures from your heart. The procedure may vary among health care providers and hospitals.   What can I expect after the test? You may return to your normal, everyday life, including diet, activities, and medicines, unless your health care provider tells you not to do that. Follow these instructions at home:  It is up to you to get the results of your test. Ask your health care provider, or the department that is doing the test, when your results will be ready.  Keep all follow-up visits. This is important. Summary  An echocardiogram is a test that uses sound waves (ultrasound) to produce images of the heart.  Images from an echocardiogram can provide important information about the size and shape of your heart, heart muscle function, heart valve function, and other possible heart problems.  You do not need to do anything to prepare before this test. You may eat and drink normally.  After the echocardiogram is completed, you may return to your normal, everyday life, unless your health care provider tells you not to do that. This information is not intended to replace advice given to you by your health care provider. Make sure you discuss any questions you have with your health care provider. Document Revised:  06/26/2020 Document Reviewed: 06/26/2020 [employer] Patient Education  2021 [employer]   

## 2021-02-06 NOTE — Progress Notes (Signed)
Cardiology Office Note:    Date:  02/06/2021   ID:  Margaret Ward, DOB 03/03/1941, MRN [id number]  PCP:  Sharlene Dory, DO  Cardiologist:  Thomasene Ripple, DO  Electrophysiologist:  None   Referring MD: Sharlene Dory*   I am doing fine  History of Present Illness:    Margaret Ward is a 80 y.o. female with a hx of CAD, CABGin March 2019 and subsequentNonSTEMI 03/01/2018 with PCI and stent heart failure CKD and acute GI bleed with gastric ulcer- leading to transfusion of5 units of PRBC's and her dual antiplatelet therapy was discontinued with recurrent bleeding while hospitalized.Other medical history include ischemic cardiomyopathy with an EF 25 to 30%, hypertension.  I last saw the patient on June 06, 2020 at that time she was experiencing recurrent chest pain I therefore sent the patient for left heart catheterization given her high risk factors for progression of coronary artery disease. In the interim she had this testing done which did not show any new blockages. All of her grafts were patent.  I saw the patient on November 15, 2020 at that time we titrated up her Sherryll Burger and continued her carvedilol 12.5 mg twice a day, spironolactone 12.5 mg daily.  Plan for an echocardiogram prior to adding [place]  She is here today for follow-up visit.  The patient is here today she offers no complaints at this time.  She appears to be doing well from a cardiovascular standpoint.  She tolerated her higher dose of Entresto well.  She is here with her daughter.  Symptoms she is having is musculoskeletal pain but she is using Biofreeze and it has been helping.    Past Medical History:  Diagnosis Date  . Anemia   . Arthritis    "hands, arms, all over the place" (03/02/2018)  . Bilateral carpal tunnel syndrome 10/11/2019  . Bronchospasm 03/28/2018  . CAD (coronary artery disease) 02/08/2018   Left heart cath 01/13/18: 40% distal LMCAS 90% proximal LAD  LCF with 90%  proximal ramus stenosis, diffuse RCA, 60% distal and 90% proximal PDA stenosis  . CAD in native artery    a. s/p CABG in 01/2018. b. Recurrent CP 02/2018 -> cath showing early graft closure SVG-diagonal, received DES to D1.  Marland Kitchen CAD/s/p CABG Mar 2019 w/ DES to vein graft Apr 2019 05/21/2018  . 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 Carpal tunnel syndrome, bilateral 12/28/2018  . Chest pain 02/05/2018  . Chronic combined systolic and diastolic CHF (congestive heart failure) (HCC)    a. LVEF 40-45% by echo 02/2018.  Marland Kitchen Chronic combined systolic and diastolic heart failure (HCC) 05/21/2018  . Chronic diastolic heart failure (HCC) 03/28/2018  . 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 Chronic hypoxemic respiratory failure (HCC) 05/21/2018  . Chronic lower back pain   . CKD (chronic kidney disease), stage III (HCC)   . Diabetes mellitus type 2 in obese (HCC) 04/23/2018  . GERD (gastroesophageal reflux disease)   . Glass eye    right eye  . Gout    "on daily RX" (03/02/2018)  . Heme positive stool 05/21/2018  . History of kidney stones   . HLD (hyperlipidemia) 05/21/2018  . Hyperlipidemia   . Hypertension   . Hypertensive heart disease   . 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  . Kidney cysts   . Kidney disease   . Left ventricular aneurysm   . Migraine    "used to get them twice/week; haven't had  them in a long time" (03/02/2018)  . NSTEMI (non-ST elevated myocardial infarction) (HCC) 03/01/2018  . On home oxygen therapy    "3L at night and prn during the day" (03/02/2018)  . Pneumonia    used to get it twice/year; stopped after I had hysterectomy" (03/02/2018)  . 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 Sinus tachycardia 03/09/2018  . Sleep apnea   . SOB (shortness of breath) 03/09/2018  . Symptomatic anemia 05/21/2018  . Type II diabetes mellitus (HCC)     Past Surgical History:  Procedure Laterality Date  . BACK SURGERY    . BIOPSY  05/22/2018   Procedure: BIOPSY;  Surgeon: Kathi DerBrahmbhatt, Parag,  MD;  Location: [facility];  Service: Gastroenterology;;  . CARDIAC CATHETERIZATION  12/2017   "before OHS"  . CHOLECYSTECTOMY OPEN  1982  . CORONARY ARTERY BYPASS GRAFT  01/19/2018   [place]  . CORONARY STENT INTERVENTION N/A 03/02/2018   Procedure: CORONARY STENT INTERVENTION;  Surgeon: Kathleene HazelMcAlhany, Christopher D, MD;  Location: [facility];  Service: Cardiovascular;  Laterality: N/A;  . DILATION AND CURETTAGE OF UTERUS    . ESOPHAGOGASTRODUODENOSCOPY (EGD) WITH PROPOFOL N/A 05/22/2018   Procedure: ESOPHAGOGASTRODUODENOSCOPY (EGD) WITH PROPOFOL;  Surgeon: Kathi DerBrahmbhatt, Parag, MD;  Location: [facility];  Service: Gastroenterology;  Laterality: N/A;  . ESOPHAGOGASTRODUODENOSCOPY (EGD) WITH PROPOFOL N/A 05/24/2018   Procedure: ESOPHAGOGASTRODUODENOSCOPY (EGD) WITH PROPOFOL;  Surgeon: Kathi DerBrahmbhatt, Parag, MD;  Location: [facility];  Service: Gastroenterology;  Laterality: N/A;  . 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 KNEE ARTHROSCOPY Right X 4  . LEFT HEART CATH AND CORS/GRAFTS ANGIOGRAPHY N/A 03/02/2018   Procedure: LEFT HEART CATH AND CORS/GRAFTS ANGIOGRAPHY;  Surgeon: Kathleene HazelMcAlhany, Christopher D, MD;  [facility];  Service: Cardiovascular;  Laterality: N/A;  . LEFT HEART CATH AND CORS/GRAFTS ANGIOGRAPHY N/A 06/13/2020   Procedure: LEFT HEART CATH AND CORS/GRAFTS ANGIOGRAPHY;  Surgeon: Kathleene HazelMcAlhany, Christopher D, MD;  [facility];  Service: Cardiovascular;  Laterality: N/A;  . LUMBAR DISC SURGERY    . REDUCTION MAMMAPLASTY Bilateral X 2  . SCHLEROTHERAPY  05/24/2018   Procedure: Theresia MajorsSCHLEROTHERAPY;  Surgeon: Kathi DerBrahmbhatt, Parag, MD;  Location: [facility];  Service: Gastroenterology;;  . TONSILLECTOMY  1953  . VAGINAL HYSTERECTOMY  1970    Current Medications: Current Meds  Medication Sig  . acetaminophen (TYLENOL) 500 MG tablet Take 1,000 mg by mouth every 6 (six) hours as needed for headache.   . allopurinol (ZYLOPRIM) 100 MG tablet TAKE  1 TABLET(100 MG) BY MOUTH DAILY  . atorvastatin (LIPITOR) 40 MG tablet Take 1 tablet (40 mg total) by mouth at bedtime.  . carvedilol (COREG) 12.5 MG tablet Take 1 tablet (12.5 mg total) by mouth 2 (two) times daily.  . clopidogrel (PLAVIX) 75 MG tablet TAKE 1 TABLET(75 MG) BY MOUTH DAILY  . furosemide (LASIX) 40 MG tablet TAKE 1 TABLET BY MOUTH EVERY DAY, TAKE ADDITIONAL TABLET ON MONDAY, WEDNESDAY, AND FRIDAY  . isosorbide mononitrate (IMDUR) 30 MG 24 hr tablet TAKE 1 TABLET(30 MG) BY MOUTH DAILY  . metFORMIN (GLUCOPHAGE-XR) 500 MG 24 hr tablet Take 1 tab daily for a week and then take 2 tabs daily.  . Multiple Vitamin (MULTIVITAMIN WITH MINERALS) TABS tablet Take 1 tablet by mouth daily.  . nitroGLYCERIN (NITROSTAT) 0.4 MG SL tablet Place 1 tablet (0.4 mg total) under the tongue every 5 (five) minutes x 3 doses as needed for chest pain.  . pantoprazole (PROTONIX)  20 MG tablet Take 20 mg by mouth 2 (two) times daily.   . Pseudoeph-Doxylamine-DM-APAP (NYQUIL PO) Take 1 Dose by mouth at bedtime as needed (congestion).   . sacubitril-valsartan (ENTRESTO) 49-51 MG Take 1 tablet by mouth 2 (two) times daily.  . vitamin C (ASCORBIC ACID) 500 MG tablet Take 500 mg by mouth 2 (two) times daily.     Allergies:   Methylprednisolone, Keflex [cephalexin], Ketorolac, Morphine and related, Novocain [procaine], Ramipril, and Septra [sulfamethoxazole-trimethoprim]   Social History   Socioeconomic History  . Marital status: Married    Spouse name: Not on file  . Number of children: Not on file  . Years of education: Not on file  . Highest education level: Not on file  Occupational History  . Not on file  Tobacco Use  . Smoking status: Former Smoker    Packs/day: 0.12    Years: 3.00    Pack years: 0.36    Types: Cigarettes    Quit date: 1963    Years since quitting: 59.2  . Smokeless tobacco: Never Used  Vaping Use  . Vaping Use: Never used  Substance and Sexual Activity  . Alcohol use: Not  Currently  . Drug use: Never  . Sexual activity: Not on file  Other Topics Concern  . Not on file  Social History Narrative  . Not on file   Social Determinants of Health   Financial Resource Strain: Not on file  Food Insecurity: Not on file  Transportation Needs: Not on file  Physical Activity: Not on file  Stress: Not on file  Social Connections: Not on file     Family History: The patient's family history includes Diabetes in her father; Heart Problems in her mother; Heart disease in her brother; Hypertension in her brother, daughter, daughter, daughter, and daughter; Kidney disease in her brother and sister.  ROS:   Review of Systems  Constitution: Negative for decreased appetite, fever and weight gain.  HENT: Negative for congestion, ear discharge, hoarse voice and sore throat.   Eyes: Negative for discharge, redness, vision loss in right eye and visual halos.  Cardiovascular: Negative for chest pain, dyspnea on exertion, leg swelling, orthopnea and palpitations.  Respiratory: Negative for cough, hemoptysis, shortness of breath and snoring.   Endocrine: Negative for heat intolerance and polyphagia.  Hematologic/Lymphatic: Negative for bleeding problem. Does not bruise/bleed easily.  Skin: Negative for flushing, nail changes, rash and suspicious lesions.  Musculoskeletal: Negative for arthritis, joint pain, muscle cramps, myalgias, neck pain and stiffness.  Gastrointestinal: Negative for abdominal pain, bowel incontinence, diarrhea and excessive appetite.  Genitourinary: Negative for decreased libido, genital sores and incomplete emptying.  Neurological: Negative for brief paralysis, focal weakness, headaches and loss of balance.  Psychiatric/Behavioral: Negative for altered mental status, depression and suicidal ideas.  Allergic/Immunologic: Negative for HIV exposure and persistent infections.    EKGs/Labs/Other Studies Reviewed:    The following studies were reviewed  today:   EKG: None today  Transthoracic echocardiogram done in June 2021 1. Since the last study on 03/02/2018 LVEF has further decreased now  25-30% with diffuse hypokinesis.  2. Left ventricular ejection fraction, by estimation, is 25 to 30%. The  left ventricle has severely decreased function. The left ventricle  demonstrates global hypokinesis. Left ventricular diastolic parameters are  consistent with Grade I diastolic  dysfunction (impaired relaxation). Elevated left atrial pressure.  3. Right ventricular systolic function is normal. The right ventricular  size is normal.  4. The mitral valve is  normal in structure. No evidence of mitral valve  regurgitation. No evidence of mitral stenosis.  5. The aortic valve is normal in structure. Aortic valve regurgitation is  not visualized. No aortic stenosis is present.  6. The inferior vena cava is normal in size with greater than 50%  respiratory variability, suggesting right atrial pressure of 3 mmHg.   Recent Labs: 06/06/2020: Hemoglobin 13.5; Platelets 157 08/21/2020: Magnesium 2.1 10/24/2020: ALT 13; BUN 39; Creatinine, Ser 1.32; Potassium 3.9; Sodium 143  Recent Lipid Panel    Component Value Date/Time   CHOL 96 10/24/2020 0839   CHOL 115 04/11/2020 0853   TRIG 110.0 10/24/2020 0839   HDL 44.90 10/24/2020 0839   HDL 57 04/11/2020 0853   CHOLHDL 2 10/24/2020 0839   VLDL 22.0 10/24/2020 0839   LDLCALC 29 10/24/2020 0839   LDLCALC 37 04/11/2020 0853   LDLDIRECT 30 09/08/2019 1512    Physical Exam:    VS:  BP 118/62   Pulse 76   Ht 5\' 2"  (1.575 m)   Wt 166 lb (75.3 kg)   SpO2 97%   BMI 30.36 kg/m     Wt Readings from Last 3 Encounters:  02/06/21 166 lb (75.3 kg)  01/23/21 168 lb (76.2 kg)  11/12/20 171 lb 6.4 oz (77.7 kg)     GEN: Well nourished, well developed in no acute distress HEENT: Normal NECK: No JVD; No carotid bruits LYMPHATICS: No lymphadenopathy CARDIAC: S1S2 noted,RRR, no murmurs, rubs,  gallops RESPIRATORY:  Clear to auscultation without rales, wheezing or rhonchi  ABDOMEN: Soft, non-tender, non-distended, +bowel sounds, no guarding. EXTREMITIES: No edema, No cyanosis, no clubbing MUSCULOSKELETAL:  No deformity  SKIN: Warm and dry NEUROLOGIC:  Alert and oriented x 3, non-focal PSYCHIATRIC:  Normal affect, good insight  ASSESSMENT:    1. Hypertension, unspecified type   2. Ischemic cardiomyopathy   3. Hypertensive heart disease with heart failure (HCC)   4. CAD/s/p CABG Mar 2019 w/ DES to vein graft Apr 2019   5. Stage 3a chronic kidney disease (HCC)   6. Mixed hyperlipidemia    PLAN:     She is tolerating her increased dose of Entresto.  We will continue patient on carvedilol 12.5 mg twice daily along with her Aldactone.  She will get repeat echocardiogram.  I am hoping that her heart function has improved with her current dose of medication.  If not we will going to continue to titrate the [facility] and add an SGLT 2 inhibitor.  No anginal symptoms.  Continue patient on her atorvastatin and Plavix.  The patient understands the need to lose weight with diet and exercise. We have discussed specific strategies for this.  She is fully compensated no signs of volume overload.  The patient is in agreement with the above plan. The patient left the office in stable condition.  The patient will follow up in 6 months or sooner if needed.   Medication Adjustments/Labs and Tests Ordered: Current medicines are reviewed at length with the patient today.  Concerns regarding medicines are outlined above.  Orders Placed This Encounter  Procedures  . ECHOCARDIOGRAM COMPLETE   No orders of the defined types were placed in this encounter.   Patient Instructions   Medication Instructions:  Your physician recommends that you continue on your current medications as directed. Please refer to the Current Medication list given to you today.  *If you need a refill on your cardiac  medications before your next appointment, please call your pharmacy*  Lab Work: None If you have labs (blood work) drawn today and your tests are completely normal, you will receive your results only by: Marland Kitchen MyChart Message (if you have MyChart) OR . A paper copy in the mail If you have any lab test that is abnormal or we need to change your treatment, we will call you to review the results.   Testing/Procedures: Your physician has requested that you have an echocardiogram. Echocardiography is a painless test that uses sound waves to create images of your heart. It provides your doctor with information about the size and shape of your heart and how well your heart's chambers and valves are working. This procedure takes approximately one hour. There are no restrictions for this procedure.    Follow-Up: At [facility], you and your health needs are our priority.  As part of our continuing mission to provide you with exceptional heart care, we have created designated Provider Care Teams.  These Care Teams include your primary Cardiologist (physician) and Advanced Practice Providers (APPs -  Physician Assistants and Nurse Practitioners) who all work together to provide you with the care you need, when you need it.  We recommend signing up for the patient portal called "MyChart".  Sign up information is provided on this After Visit Summary.  MyChart is used to connect with patients for Virtual Visits (Telemedicine).  Patients are able to view lab/test results, encounter notes, upcoming appointments, etc.  Non-urgent messages can be sent to your provider as well.   To learn more about what you can do with MyChart, go to [email or web address].    Your next appointment:   6 month(s)  The format for your next appointment:   In Person  Provider:   Thomasene Ripple, DO   Other Instructions  Echocardiogram An echocardiogram is a test that uses sound waves (ultrasound) to produce images of the  heart. Images from an echocardiogram can provide important information about:  Heart size and shape.  The size and thickness and movement of your heart's walls.  Heart muscle function and strength.  Heart valve function or if you have stenosis. Stenosis is when the heart valves are too narrow.  If blood is flowing backward through the heart valves (regurgitation).  A tumor or infectious growth around the heart valves.  Areas of heart muscle that are not working well because of poor blood flow or injury from a heart attack.  Aneurysm detection. An aneurysm is a weak or damaged part of an artery wall. The wall bulges out from the normal force of blood pumping through the body. Tell a health care provider about:  Any allergies you have.  All medicines you are taking, including vitamins, herbs, eye drops, creams, and over-the-counter medicines.  Any blood disorders you have.  Any surgeries you have had.  Any medical conditions you have.  Whether you are pregnant or may be pregnant. What are the risks? Generally, this is a safe test. However, problems may occur, including an allergic reaction to dye (contrast) that may be used during the test. What happens before the test? No specific preparation is needed. You may eat and drink normally. What happens during the test?  You will take off your clothes from the waist up and put on a hospital gown.  Electrodes or electrocardiogram (ECG)patches may be placed on your chest. The electrodes or patches are then connected to a device that monitors your heart rate and rhythm.  You will lie down on a table for  an ultrasound exam. A gel will be applied to your chest to help sound waves pass through your skin.  A handheld device, called a transducer, will be pressed against your chest and moved over your heart. The transducer produces sound waves that travel to your heart and bounce back (or "echo" back) to the transducer. These sound waves  will be captured in real-time and changed into images of your heart that can be viewed on a video monitor. The images will be recorded on a computer and reviewed by your health care provider.  You may be asked to change positions or hold your breath for a short time. This makes it easier to get different views or better views of your heart.  In some cases, you may receive contrast through an IV in one of your veins. This can improve the quality of the pictures from your heart. The procedure may vary among health care providers and hospitals.   What can I expect after the test? You may return to your normal, everyday life, including diet, activities, and medicines, unless your health care provider tells you not to do that. Follow these instructions at home:  It is up to you to get the results of your test. Ask your health care provider, or the department that is doing the test, when your results will be ready.  Keep all follow-up visits. This is important. Summary  An echocardiogram is a test that uses sound waves (ultrasound) to produce images of the heart.  Images from an echocardiogram can provide important information about the size and shape of your heart, heart muscle function, heart valve function, and other possible heart problems.  You do not need to do anything to prepare before this test. You may eat and drink normally.  After the echocardiogram is completed, you may return to your normal, everyday life, unless your health care provider tells you not to do that. This information is not intended to replace advice given to you by your health care provider. Make sure you discuss any questions you have with your health care provider. Document Revised: 06/26/2020 Document Reviewed: 06/26/2020 Elsevier Patient Education  2021 Elsevier Inc.      Adopting a Healthy Lifestyle.  Know what a healthy weight is for you (roughly BMI <25) and aim to maintain this   Aim for 7+ servings of  fruits and vegetables daily   65-80+ fluid ounces of water or unsweet tea for healthy kidneys   Limit to max 1 drink of alcohol per day; avoid smoking/tobacco   Limit animal fats in diet for cholesterol and heart health - choose grass fed whenever available   Avoid highly processed foods, and foods high in saturated/trans fats   Aim for low stress - take time to unwind and care for your mental health   Aim for 150 min of moderate intensity exercise weekly for heart health, and weights twice weekly for bone health   Aim for 7-9 hours of sleep daily   When it comes to diets, agreement about the perfect plan isnt easy to find, even among the experts. Experts at the [facility] [employer] developed an idea known as the Healthy Eating Plate. Just imagine a plate divided into logical, healthy portions.   The emphasis is on diet quality:   Load up on vegetables and fruits - one-half of your plate: Aim for color and variety, and remember that potatoes dont count.   Go for whole grains - one-quarter of  your plate: Whole wheat, barley, wheat berries, quinoa, oats, brown rice, and foods made with them. If you want pasta, go with whole wheat pasta.   Protein power - one-quarter of your plate: Fish, chicken, beans, and nuts are all healthy, versatile protein sources. Limit red meat.   The diet, however, does go beyond the plate, offering a few other suggestions.   Use healthy plant oils, such as olive, canola, soy, corn, sunflower and peanut. Check the labels, and avoid partially hydrogenated oil, which have unhealthy trans fats.   If youre thirsty, drink water. Coffee and tea are good in moderation, but skip sugary drinks and limit milk and dairy products to one or two daily servings.   The type of carbohydrate in the diet is more important than the amount. Some sources of carbohydrates, such as vegetables, fruits, whole grains, and beans-are healthier than others.   Finally, stay  active  Signed, Thomasene Ripple, DO  02/06/2021 8:31 AM    [facility]

## 2021-02-13 ENCOUNTER — Other Ambulatory Visit: Payer: Self-pay | Admitting: Family Medicine

## 2021-02-27 ENCOUNTER — Telehealth: Payer: Self-pay | Admitting: Family Medicine

## 2021-02-27 MED ORDER — CLOPIDOGREL BISULFATE 75 MG PO TABS: 75.0000 mg | ORAL_TABLET | Freq: Every day | ORAL | 1 refills | Status: DC

## 2021-02-27 NOTE — Telephone Encounter (Signed)
Refill done.  

## 2021-02-27 NOTE — Telephone Encounter (Signed)
Medication: clopidogrel (PLAVIX) 75 MG tablet [[id number]      Has the patient contacted their pharmacy?  (If no, request that the patient contact the pharmacy for the refill.) (If yes, when and what did the pharmacy advise?)     Preferred Pharmacy (with phone number or street name): [id number] [place], [place] - [address] AT [facility] OF [address] & [address]  [address] [place] [address]  Phone:  [phone] Fax:  [phone]      Agent: Please be advised that RX refills may take up to 3 business days. We ask that you follow-up with your pharmacy.

## 2021-03-04 ENCOUNTER — Telehealth: Payer: Self-pay | Admitting: Family Medicine

## 2021-03-04 NOTE — Telephone Encounter (Signed)
Called the patient and she stated she was just seen on 01/23/21

## 2021-03-04 NOTE — Telephone Encounter (Signed)
Patient states that she got a letter for [employer] duty and would like a letter to be excused due to her old age.

## 2021-03-05 ENCOUNTER — Encounter: Payer: Self-pay | Admitting: Family Medicine

## 2021-03-05 NOTE — Telephone Encounter (Signed)
Called the patient informed, but she thinks that a letter will get her excused without any questions asked and no delays.  Please submit a letter please, let me know what to put in the letter Patient husband will pickup next week when he is in the office for his appointment.

## 2021-03-05 NOTE — Telephone Encounter (Signed)
Letter done/put at the front for pickup

## 2021-03-05 NOTE — Telephone Encounter (Signed)
I'm sorry, I misinterpreted the message. She can just submit the form that she is >69 and does not wish to serve. I'm not sure she needs me to do that, but if she does, OK to write letter. Ty.

## 2021-03-05 NOTE — Telephone Encounter (Signed)
"  Please excuse the patient from jury duty as she is 80 years old."

## 2021-03-15 ENCOUNTER — Ambulatory Visit (HOSPITAL_BASED_OUTPATIENT_CLINIC_OR_DEPARTMENT_OTHER)
Admission: RE | Admit: 2021-03-15 | Discharge: 2021-03-15 | Disposition: A | Discharge status: Home / Self Care | Payer: Medicare Other | Source: Ambulatory Visit | Attending: Cardiology | Admitting: Cardiology

## 2021-03-15 ENCOUNTER — Other Ambulatory Visit: Payer: Self-pay

## 2021-03-15 DIAGNOSIS — I255 Ischemic cardiomyopathy: Secondary | ICD-10-CM | POA: Diagnosis not present

## 2021-03-15 LAB — ECHOCARDIOGRAM COMPLETE
AR max vel: 1.65 cm2
AV Area VTI: 1.98 cm2
AV Area mean vel: 1.51 cm2
AV Mean grad: 3 mmHg
AV Peak grad: 5.5 mmHg
Ao pk vel: 1.17 m/s
Area-P 1/2: 3.91 cm2
Calc EF: 53 %
S' Lateral: 3.62 cm
Single Plane A2C EF: 48.8 %
Single Plane A4C EF: 51.5 %

## 2021-03-15 NOTE — Progress Notes (Signed)
  Echocardiogram 2D Echocardiogram has been performed.  Roosvelt Maser F 03/15/2021, 9:59 AM

## 2021-04-03 ENCOUNTER — Other Ambulatory Visit: Payer: Self-pay | Admitting: Family

## 2021-04-03 ENCOUNTER — Other Ambulatory Visit: Payer: Self-pay | Admitting: Family Medicine

## 2021-04-03 NOTE — Telephone Encounter (Signed)
Refill Request.  

## 2021-04-23 ENCOUNTER — Ambulatory Visit: Payer: Medicare Other | Admitting: Family Medicine

## 2021-05-08 ENCOUNTER — Other Ambulatory Visit: Payer: Self-pay | Admitting: Cardiology

## 2021-05-08 DIAGNOSIS — E785 Hyperlipidemia, unspecified: Secondary | ICD-10-CM

## 2021-05-22 ENCOUNTER — Encounter: Payer: Self-pay | Admitting: Family Medicine

## 2021-05-22 ENCOUNTER — Other Ambulatory Visit: Payer: Self-pay

## 2021-05-22 ENCOUNTER — Ambulatory Visit (INDEPENDENT_AMBULATORY_CARE_PROVIDER_SITE_OTHER): Payer: Medicare Other | Admitting: Family Medicine

## 2021-05-22 VITALS — BP 120/68 | HR 87 | Temp 98.1°F | Ht 62.0 in | Wt 161.0 lb

## 2021-05-22 DIAGNOSIS — K219 Gastro-esophageal reflux disease without esophagitis: Secondary | ICD-10-CM

## 2021-05-22 DIAGNOSIS — S46811A Strain of other muscles, fascia and tendons at shoulder and upper arm level, right arm, initial encounter: Secondary | ICD-10-CM | POA: Diagnosis not present

## 2021-05-22 MED ORDER — PREDNISONE 20 MG PO TABS: 40.0000 mg | ORAL_TABLET | Freq: Every day | ORAL | 0 refills | Status: AC

## 2021-05-22 MED ORDER — PANTOPRAZOLE SODIUM 20 MG PO TBEC: 20.0000 mg | DELAYED_RELEASE_TABLET | Freq: Every day | ORAL | 2 refills | Status: DC

## 2021-05-22 NOTE — Patient Instructions (Signed)
Heat (pad or rice pillow in microwave) over affected area, 10-15 minutes twice daily.   Ice/cold pack over area for 10-15 min twice daily.  OK to take Tylenol 1000 mg (2 extra strength tabs) or 975 mg (3 regular strength tabs) every 6 hours as needed.  Let us know if you need anything.  Trapezius stretches/exercises Do exercises exactly as told by your health care provider and adjust them as directed. It is normal to feel mild stretching, pulling, tightness, or discomfort as you do these exercises, but you should stop right away if you feel sudden pain or your pain gets worse.   Stretching and range of motion exercises These exercises warm up your muscles and joints and improve the movement and flexibility of your shoulder. These exercises can also help to relieve pain, numbness, and tingling. If you are unable to do any of the following for any reason, do not further attempt to do it.   Exercise A: Flexion, standing     Stand and hold a broomstick, a cane, or a similar object. Place your hands a little more than shoulder-width apart on the object. Your left / right hand should be palm-up, and your other hand should be palm-down. Push the stick to raise your left / right arm out to your side and then over your head. Use your other hand to help move the stick. Stop when you feel a stretch in your shoulder, or when you reach the angle that is recommended by your health care provider. Avoid shrugging your shoulder while you raise your arm. Keep your shoulder blade tucked down toward your spine. Hold for 30 seconds. Slowly return to the starting position. Repeat 2 times. Complete this exercise 3 times per week.  Exercise B: Abduction, supine     Lie on your back and hold a broomstick, a cane, or a similar object. Place your hands a little more than shoulder-width apart on the object. Your left / right hand should be palm-up, and your other hand should be palm-down. Push the stick to raise  your left / right arm out to your side and then over your head. Use your other hand to help move the stick. Stop when you feel a stretch in your shoulder, or when you reach the angle that is recommended by your health care provider. Avoid shrugging your shoulder while you raise your arm. Keep your shoulder blade tucked down toward your spine. Hold for 30 seconds. Slowly return to the starting position. Repeat 2 times. Complete this exercise 3 times per week.  Exercise C: Flexion, active-assisted     Lie on your back. You may bend your knees for comfort. Hold a broomstick, a cane, or a similar object. Place your hands about shoulder-width apart on the object. Your palms should face toward your feet. Raise the stick and move your arms over your head and behind your head, toward the floor. Use your healthy arm to help your left / right arm move farther. Stop when you feel a gentle stretch in your shoulder, or when you reach the angle where your health care provider tells you to stop. Hold for 30 seconds. Slowly return to the starting position. Repeat 2 times. Complete this exercise 3 times per week.  Exercise D: External rotation and abduction     Stand in a door frame with one of your feet slightly in front of the other. This is called a staggered stance. Choose one of the following positions as told   by your health care provider: Place your hands and forearms on the door frame above your head. Place your hands and forearms on the door frame at the height of your head. Place your hands on the door frame at the height of your elbows. Slowly move your weight onto your front foot until you feel a stretch across your chest and in the front of your shoulders. Keep your head and chest upright and keep your abdominal muscles tight. Hold for 30 seconds. To release the stretch, shift your weight to your back foot. Repeat 2 times. Complete this stretch 3 times per week.  Strengthening  exercises These exercises build strength and endurance in your shoulder. Endurance is the ability to use your muscles for a long time, even after your muscles get tired. Exercise E: Scapular depression and adduction  Sit on a stable chair. Support your arms in front of you with pillows, armrests, or a tabletop. Keep your elbows in line with the sides of your body. Gently move your shoulder blades down toward your middle back. Relax the muscles on the tops of your shoulders and in the back of your neck. Hold for 3 seconds. Slowly release the tension and relax your muscles completely before doing this exercise again. Repeat for a total of 10 repetitions. After you have practiced this exercise, try doing the exercise without the arm support. Then, try the exercise while standing instead of sitting. Repeat 2 times. Complete this exercise 3 times per week.  Exercise F: Shoulder abduction, isometric     Stand or sit about 4-6 inches (10-15 cm) from a wall with your left / right side facing the wall. Bend your left / right elbow and gently press your elbow against the wall. Increase the pressure slowly until you are pressing as hard as you can without shrugging your shoulder. Hold for 3 seconds. Slowly release the tension and relax your muscles completely. Repeat for a total of 10 repetitions. Repeat 2 times. Complete this exercise 3 times per week.  Exercise G: Shoulder flexion, isometric     Stand or sit about 4-6 inches (10-15 cm) away from a wall with your left / right side facing the wall. Keep your left / right elbow straight and gently press the top of your fist against the wall. Increase the pressure slowly until you are pressing as hard as you can without shrugging your shoulder. Hold for 10-15 seconds. Slowly release the tension and relax your muscles completely. Repeat for a total of 10 repetitions. Repeat 2 times. Complete this exercise 3 times per week.  Exercise H: Internal  rotation     Sit in a stable chair without armrests, or stand. Secure an exercise band at your left / right side, at elbow height. Place a soft object, such as a folded towel or a small pillow, under your left / right upper arm so your elbow is a few inches (about 8 cm) away from your side. Hold the end of the exercise band so the band stretches. Keeping your elbow pressed against the soft object under your arm, move your forearm across your body toward your abdomen. Keep your body steady so the movement is only coming from your shoulder. Hold for 3 seconds. Slowly return to the starting position. Repeat for a total of 10 repetitions. Repeat 2 times. Complete this exercise 3 times per week.  Exercise I: External rotation     Sit in a stable chair without armrests, or stand. Secure an   exercise band at your left / right side, at elbow height. Place a soft object, such as a folded towel or a small pillow, under your left / right upper arm so your elbow is a few inches (about 8 cm) away from your side. Hold the end of the exercise band so the band stretches. Keeping your elbow pressed against the soft object under your arm, move your forearm out, away from your abdomen. Keep your body steady so the movement is only coming from your shoulder. Hold for 3 seconds. Slowly return to the starting position. Repeat for a total of 10 repetitions. Repeat 2 times. Complete this exercise 3 times per week. Exercise J: Shoulder extension  Sit in a stable chair without armrests, or stand. Secure an exercise band to a stable object in front of you so the band is at shoulder height. Hold one end of the exercise band in each hand. Your palms should face each other. Straighten your elbows and lift your hands up to shoulder height. Step back, away from the secured end of the exercise band, until the band stretches. Squeeze your shoulder blades together and pull your hands down to the sides of your thighs. Stop  when your hands are straight down by your sides. Do not let your hands go behind your body. Hold for 3 seconds. Slowly return to the starting position. Repeat for a total of 10 repetitions. Repeat 2 times. Complete this exercise 3 times per week.  Exercise K: Shoulder extension, prone     Lie on your abdomen on a firm surface so your left / right arm hangs over the edge. Hold a 5 lb weight in your hand so your palm faces in toward your body. Your arm should be straight. Squeeze your shoulder blade down toward the middle of your back. Slowly raise your arm behind you, up to the height of the surface that you are lying on. Keep your arm straight. Hold for 3 seconds. Slowly return to the starting position and relax your muscles. Repeat for a total of 10 repetitions. Repeat 2 times. Complete this exercise 3 times per week.   Exercise L: Horizontal abduction, prone  Lie on your abdomen on a firm surface so your left / right arm hangs over the edge. Hold a 5 lb weight in your hand so your palm faces toward your feet. Your arm should be straight. Squeeze your shoulder blade down toward the middle of your back. Bend your elbow so your hand moves up, until your elbow is bent to an "L" shape (90 degrees). With your elbow bent, slowly move your forearm forward and up. Raise your hand up to the height of the surface that you are lying on. Your upper arm should not move, and your elbow should stay bent. At the top of the movement, your palm should face the floor. Hold for 3 seconds. Slowly return to the starting position and relax your muscles. Repeat for a total of 10 repetitions. Repeat 2 times. Complete this exercise 3 times per week.  Exercise M: Horizontal abduction, standing  Sit on a stable chair, or stand. Secure an exercise band to a stable object in front of you so the band is at shoulder height. Hold one end of the exercise band in each hand. Straighten your elbows and lift your hands  straight in front of you, up to shoulder height. Your palms should face down, toward the floor. Step back, away from the secured end of the   exercise band, until the band stretches. Move your arms out to your sides, and keep your arms straight. Hold for 3 seconds. Slowly return to the starting position. Repeat for a total of 10 repetitions. Repeat 2 times. Complete this exercise 3 times per week.  Exercise N: Scapular retraction and elevation  Sit on a stable chair, or stand. Secure an exercise band to a stable object in front of you so the band is at shoulder height. Hold one end of the exercise band in each hand. Your palms should face each other. Sit in a stable chair without armrests, or stand. Step back, away from the secured end of the exercise band, until the band stretches. Squeeze your shoulder blades together and lift your hands over your head. Keep your elbows straight. Hold for 3 seconds. Slowly return to the starting position. Repeat for a total of 10 repetitions. Repeat 2 times. Complete this exercise 3 times per week.  This information is not intended to replace advice given to you by your health care provider. Make sure you discuss any questions you have with your health care provider. Document Released: 11/03/2005 Document Revised: 07/10/2016 Document Reviewed: 09/20/2015 Elsevier Interactive Patient Education  2017 [employer] Inc[employer]  

## 2021-05-22 NOTE — Progress Notes (Signed)
Musculoskeletal Exam  Patient: Margaret Ward DOB: May 06, 1941  DOS: 05/22/2021  SUBJECTIVE:  Chief Complaint:   Chief Complaint  Patient presents with   Back Pain   Gastroesophageal Reflux    Unable to sleep because of pain     Margaret Ward is a 80 y.o.  female for evaluation and treatment of R upper back pain.   Onset:  1 week ago. No inj or change in activity.  Location: R upper shoulder Character:  aching  Progression of issue:  has slightly improved Associated symptoms: swelling; no decreased ROM, redness, bruising Treatment: to date has been acetaminophen.   Neurovascular symptoms: no  GERD Hx of reflux on Protonix 20 mg/d. She stopped the med with sharp return of reflux s/s's. She has a GI who wouldnd't refill as she has not been seen since 2020. She was told she needs an EGD but her cardiologist does not think she is optimized from a cardiac standpoint to do this.   Past Medical History:  Diagnosis Date   Anemia    Arthritis    "hands, arms, all over the place" (03/02/2018)   Bilateral carpal tunnel syndrome 10/11/2019   Bronchospasm 03/28/2018   CAD (coronary artery disease) 02/08/2018   Left heart cath 01/13/18: 40% distal LMCAS 90% proximal LAD  LCF with 90% proximal ramus stenosis, diffuse RCA, 60% distal and 90% proximal PDA stenosis   CAD in native artery    a. s/p CABG in 01/2018. b. Recurrent CP 02/2018 -> cath showing early graft closure SVG-diagonal, received DES to D1.   CAD/s/p CABG Mar 2019 w/ DES to vein graft Apr 2019 05/21/2018   Cameron ulcer, acute 05/27/2018   Carpal tunnel syndrome, bilateral 12/28/2018   Chest pain 02/05/2018   Chronic combined systolic and diastolic CHF (congestive heart failure) (HCC)    a. LVEF 40-45% by echo 02/2018.   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   Chronic hypoxemic respiratory failure (HCC)  05/21/2018   Chronic lower back pain    CKD (chronic kidney disease), stage III (HCC)    Diabetes mellitus type 2 in obese (HCC) 04/23/2018   GERD (gastroesophageal reflux disease)    Glass eye    right eye   Gout    "on daily RX" (03/02/2018)   Heme positive stool 05/21/2018   History of kidney stones    HLD (hyperlipidemia) 05/21/2018   Hyperlipidemia    Hypertension    Hypertensive heart disease    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   Kidney cysts    Kidney disease    Left ventricular aneurysm    Migraine    "used to get them twice/week; haven't had them in a long time" (03/02/2018)   NSTEMI (non-ST elevated myocardial infarction) (HCC) 03/01/2018   On home oxygen therapy    "3L at night and prn during the day" (03/02/2018)   Pneumonia    used to get it twice/year; stopped after I had hysterectomy" (03/02/2018)   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 No acute distress. Thorax & Lungs: No accessory muscle use Musculoskeletal: R shoulder/neck.   Normal active range of motion: no.   Normal passive range of motion: no Tenderness to palpation: yes over trap Deformity: no Ecchymosis: no Tests positive: none Tests negative: Spurling's Neurologic: Normal sensory function. No focal deficits noted. DTR's equal and symmetric in UE's. No clonus. Psychiatric: Normal mood. Age appropriate judgment and insight. Alert & oriented x 3.    Assessment:  Strain of right trapezius muscle, initial encounter - Plan: predniSONE (DELTASONE) 20 MG tablet  Gastroesophageal reflux disease, unspecified whether esophagitis present - Plan:  pantoprazole (PROTONIX) 20 MG tablet  Plan: Stretches/exercises, heat, ice, Tylenol. 5 d pred burst. Chronic, uncontrolled. Go back on Protonix 20 mg/d.  F/u as originally scheduled. . The patient voiced understanding and agreement to the plan.   Jilda [place], DO 05/22/21  8:45 AM

## 2021-06-04 DIAGNOSIS — Z20822 Contact with and (suspected) exposure to covid-19: Secondary | ICD-10-CM | POA: Diagnosis not present

## 2021-06-05 ENCOUNTER — Other Ambulatory Visit: Payer: Self-pay | Admitting: Family Medicine

## 2021-07-03 ENCOUNTER — Other Ambulatory Visit: Payer: Self-pay | Admitting: Cardiology

## 2021-07-03 DIAGNOSIS — I110 Hypertensive heart disease with heart failure: Secondary | ICD-10-CM

## 2021-07-03 DIAGNOSIS — I5032 Chronic diastolic (congestive) heart failure: Secondary | ICD-10-CM

## 2021-07-03 DIAGNOSIS — I214 Non-ST elevation (NSTEMI) myocardial infarction: Secondary | ICD-10-CM

## 2021-07-03 DIAGNOSIS — I10 Essential (primary) hypertension: Secondary | ICD-10-CM

## 2021-07-03 MED ORDER — ISOSORBIDE MONONITRATE ER 30 MG PO TB24: ORAL_TABLET | ORAL | 3 refills | Status: DC

## 2021-07-03 MED ORDER — FUROSEMIDE 40 MG PO TABS: ORAL_TABLET | ORAL | 3 refills | Status: DC

## 2021-07-03 NOTE — Telephone Encounter (Signed)
Isosorbide Mononitrate 30 mg ER # 90 x 3 refills  Furosemide 40 mg # 126 x 3 refills  Sent to [facility], [place]

## 2021-07-24 ENCOUNTER — Other Ambulatory Visit: Payer: Self-pay

## 2021-07-24 ENCOUNTER — Encounter: Payer: Self-pay | Admitting: Cardiology

## 2021-07-24 ENCOUNTER — Ambulatory Visit (INDEPENDENT_AMBULATORY_CARE_PROVIDER_SITE_OTHER): Payer: Medicare Other | Admitting: Cardiology

## 2021-07-24 VITALS — BP 118/64 | HR 74 | Ht 62.0 in | Wt 161.0 lb

## 2021-07-24 DIAGNOSIS — I2510 Atherosclerotic heart disease of native coronary artery without angina pectoris: Secondary | ICD-10-CM

## 2021-07-24 DIAGNOSIS — G4733 Obstructive sleep apnea (adult) (pediatric): Secondary | ICD-10-CM

## 2021-07-24 DIAGNOSIS — I420 Dilated cardiomyopathy: Secondary | ICD-10-CM

## 2021-07-24 DIAGNOSIS — I519 Heart disease, unspecified: Secondary | ICD-10-CM | POA: Diagnosis not present

## 2021-07-24 DIAGNOSIS — I110 Hypertensive heart disease with heart failure: Secondary | ICD-10-CM | POA: Diagnosis not present

## 2021-07-24 DIAGNOSIS — Z79899 Other long term (current) drug therapy: Secondary | ICD-10-CM | POA: Diagnosis not present

## 2021-07-24 NOTE — Patient Instructions (Signed)
Medication Instructions:  Your physician recommends that you continue on your current medications as directed. Please refer to the Current Medication list given to you today.  *If you need a refill on your cardiac medications before your next appointment, please call your pharmacy*   Lab Work: Your physician recommends that you return for lab work in:  TODAY: BMET, Mag If you have labs (blood work) drawn today and your tests are completely normal, you will receive your results only by: MyChart Message (if you have MyChart) OR A paper copy in the mail If you have any lab test that is abnormal or we need to change your treatment, we will call you to review the results.   Testing/Procedures: Your physician has requested that you have an echocardiogram. Echocardiography is a painless test that uses sound waves to create images of your heart. It provides your doctor with information about the size and shape of your heart and how well your heart's chambers and valves are working. This procedure takes approximately one hour. There are no restrictions for this procedure.    Follow-Up: At [facility], you and your health needs are our priority.  As part of our continuing mission to provide you with exceptional heart care, we have created designated Provider Care Teams.  These Care Teams include your primary Cardiologist (physician) and Advanced Practice Providers (APPs -  Physician Assistants and Nurse Practitioners) who all work together to provide you with the care you need, when you need it.  We recommend signing up for the patient portal called "MyChart".  Sign up information is provided on this After Visit Summary.  MyChart is used to connect with patients for Virtual Visits (Telemedicine).  Patients are able to view lab/test results, encounter notes, upcoming appointments, etc.  Non-urgent messages can be sent to your provider as well.   To learn more about what you can do with MyChart, go to  [email or web address].    Your next appointment:   1 year(s)  The format for your next appointment:   In Person  Provider:   Thomasene Ripple, DO [id number], [place], [place] [address]    Other Instructions Echocardiogram An echocardiogram is a test that uses sound waves (ultrasound) to produce images of the heart. Images from an echocardiogram can provide important information about: Heart size and shape. The size and thickness and movement of your heart's walls. Heart muscle function and strength. Heart valve function or if you have stenosis. Stenosis is when the heart valves are too narrow. If blood is flowing backward through the heart valves (regurgitation). A tumor or infectious growth around the heart valves. Areas of heart muscle that are not working well because of poor blood flow or injury from a heart attack. Aneurysm detection. An aneurysm is a weak or damaged part of an artery wall. The wall bulges out from the normal force of blood pumping through the body. Tell a health care provider about: Any allergies you have. All medicines you are taking, including vitamins, herbs, eye drops, creams, and over-the-counter medicines. Any blood disorders you have. Any surgeries you have had. Any medical conditions you have. Whether you are pregnant or may be pregnant. What are the risks? Generally, this is a safe test. However, problems may occur, including an allergic reaction to dye (contrast) that may be used during the test. What happens before the test? No specific preparation is needed. You may eat and drink normally. What happens during the test?  You will take  off your clothes from the waist up and put on a hospital gown. Electrodes or electrocardiogram (ECG)patches may be placed on your chest. The electrodes or patches are then connected to a device that monitors your heart rate and rhythm. You will lie down on a table for an ultrasound exam. A gel will be  applied to your chest to help sound waves pass through your skin. A handheld device, called a transducer, will be pressed against your chest and moved over your heart. The transducer produces sound waves that travel to your heart and bounce back (or "echo" back) to the transducer. These sound waves will be captured in real-time and changed into images of your heart that can be viewed on a video monitor. The images will be recorded on a computer and reviewed by your health care provider. You may be asked to change positions or hold your breath for a short time. This makes it easier to get different views or better views of your heart. In some cases, you may receive contrast through an IV in one of your veins. This can improve the quality of the pictures from your heart. The procedure may vary among health care providers and hospitals. What can I expect after the test? You may return to your normal, everyday life, including diet, activities, and medicines, unless your health care provider tells you not to do that. Follow these instructions at home: It is up to you to get the results of your test. Ask your health care provider, or the department that is doing the test, when your results will be ready. Keep all follow-up visits. This is important. Summary An echocardiogram is a test that uses sound waves (ultrasound) to produce images of the heart. Images from an echocardiogram can provide important information about the size and shape of your heart, heart muscle function, heart valve function, and other possible heart problems. You do not need to do anything to prepare before this test. You may eat and drink normally. After the echocardiogram is completed, you may return to your normal, everyday life, unless your health care provider tells you not to do that. This information is not intended to replace advice given to you by your health care provider. Make sure you discuss any questions you have with  your health care provider. Document Revised: 06/26/2020 Document Reviewed: 06/26/2020 Elsevier Patient Education  2022 [employer].

## 2021-07-24 NOTE — Progress Notes (Signed)
Cardiology Office Note:    Date:  07/24/2021   ID:  Margaret Ward, DOB Mar 29, 1941, MRN [id number]  PCP:  Sharlene Dory, DO  Cardiologist:  Thomasene Ripple, DO  Electrophysiologist:  None   Referring MD: Sharlene Dory*   " I am doing well"  History of Present Illness:    Margaret Ward is a 80 y.o. female with a hx of CAD, CABG in March 2019 and subsequent NonSTEMI 03/01/2018 with PCI and stent  heart failure CKD and acute GI bleed with gastric ulcer- leading to transfusion of 5 units of PRBC's and her dual antiplatelet therapy was discontinued with recurrent bleeding while hospitalized.  Other medical history include ischemic cardiomyopathy with an EF 25 to 30%, hypertension.   I last saw the patient on June 06, 2020 at that time she was experiencing recurrent chest pain I therefore sent the patient for left heart catheterization given her high risk factors for progression of coronary artery disease.  In the interim she had this testing done which did not show any new blockages.  All of her grafts were patent.   I saw the patient on November 15, 2020 at that time we titrated up her Sherryll Burger and continued her carvedilol 12.5 mg twice a day, spironolactone 12.5 mg daily.  Plan for an echocardiogram prior to adding [place].  She was seen in march 2022 at that time she was tolerating her high dose of entresto. We repeated her echo which showed slightly improved ejection fraction.  She is here with her daughter she offers no complaints at this time from a cardiovascular standpoint.  She is having trouble sleeping and is inquiring about medication to assist with improving her sleep.  Past Medical History:  Diagnosis Date   Anemia    Arthritis    "hands, arms, all over the place" (03/02/2018)   Bilateral carpal tunnel syndrome 10/11/2019   Bronchospasm 03/28/2018   CAD (coronary artery disease) 02/08/2018   Left heart cath 01/13/18: 40% distal LMCAS 90% proximal LAD   LCF with 90% proximal ramus stenosis, diffuse RCA, 60% distal and 90% proximal PDA stenosis   CAD in native artery    a. s/p CABG in 01/2018. b. Recurrent CP 02/2018 -> cath showing early graft closure SVG-diagonal, received DES to D1.   CAD/s/p CABG Mar 2019 w/ DES to vein graft Apr 2019 05/21/2018   Cameron ulcer, acute 05/27/2018   Carpal tunnel syndrome, bilateral 12/28/2018   Chest pain 02/05/2018   Chronic combined systolic and diastolic CHF (congestive heart failure) (HCC)    a. LVEF 40-45% by echo 02/2018.   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   Chronic hypoxemic respiratory failure (HCC) 05/21/2018   Chronic lower back pain    CKD (chronic kidney disease), stage III (HCC)    Diabetes mellitus type 2 in obese (HCC) 04/23/2018   GERD (gastroesophageal reflux disease)    Glass eye    right eye   Gout    "on daily RX" (03/02/2018)   Heme positive stool 05/21/2018   History of kidney stones    HLD (hyperlipidemia) 05/21/2018   Hyperlipidemia    Hypertension    Hypertensive heart disease    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   Kidney cysts    Kidney disease    Left ventricular aneurysm    Migraine    "used  to get them twice/week; haven't had them in a long time" (03/02/2018)   NSTEMI (non-ST elevated myocardial infarction) (HCC) 03/01/2018   On home oxygen therapy    "3L at night and prn during the day" (03/02/2018)   Pneumonia    used to get it twice/year; stopped after I had hysterectomy" (03/02/2018)   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   Sinus tachycardia 03/09/2018   Sleep apnea    SOB (shortness of breath) 03/09/2018   Symptomatic anemia 05/21/2018   Type II diabetes mellitus (HCC)     Past Surgical History:  Procedure Laterality Date   BACK SURGERY     BIOPSY  05/22/2018   Procedure: BIOPSY;  Surgeon: Kathi Der, MD;  Location: [facility]  ENDOSCOPY;  Service: Gastroenterology;;   CARDIAC CATHETERIZATION  12/2017   "before [facility]"   CHOLECYSTECTOMY OPEN  1982   CORONARY ARTERY BYPASS GRAFT  01/19/2018   "CABG X3; in [place]"   CORONARY STENT INTERVENTION N/A 03/02/2018   Procedure: CORONARY STENT INTERVENTION;  Surgeon: Kathleene Hazel, MD;  Location: [facility] INVASIVE CV LAB;  Service: Cardiovascular;  Laterality: N/A;   DILATION AND CURETTAGE OF UTERUS     ESOPHAGOGASTRODUODENOSCOPY (EGD) WITH PROPOFOL N/A 05/22/2018   Procedure: ESOPHAGOGASTRODUODENOSCOPY (EGD) WITH PROPOFOL;  Surgeon: Kathi Der, MD;  Location: [facility] ENDOSCOPY;  Service: Gastroenterology;  Laterality: N/A;   ESOPHAGOGASTRODUODENOSCOPY (EGD) WITH PROPOFOL N/A 05/24/2018   Procedure: ESOPHAGOGASTRODUODENOSCOPY (EGD) WITH PROPOFOL;  Surgeon: Kathi Der, MD;  Location: [facility] ENDOS[facility];  Service: Gastroenterology;  Laterality: N/A;   EYE SURGERY Right    "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"   KNEE ARTHROSCOPY Right X 4   LEFT HEART CATH AND CORS/GRAFTS ANGIOGRAPHY N/A 03/02/2018   Procedure: LEFT HEART CATH AND CORS/GRAFTS ANGIOGRAPHY;  Surgeon: Kathleene Hazel, MD;  Location: [facility];  Service: Cardiovascular;  Laterality: N/A;   LEFT HEART CATH AND CORS/GRAFTS ANGIOGRAPHY N/A 06/13/2020   Procedure: LEFT HEART CATH AND CORS/GRAFTS ANGIOGRAPHY;  Surgeon: Kathleene Hazel, MD;  Location: [facility];  Service: Cardiovascular;  Laterality: N/A;   LUMBAR DISC SURGERY     REDUCTION MAMMAPLASTY Bilateral X 2   SCHLEROTHERAPY  05/24/2018   Procedure: SCHLEROTHERAPY;  Surgeon: Kathi Der, MD;  Location: [facility] ENDOS[facility];  Service: Gastroenterology;;   TONSILLECTOMY  1953   VAGINAL HYSTERECTOMY  1970    Current Medications: Current Meds  Medication Sig   acetaminophen (TYLENOL) 500 MG tablet Take 1,000 mg by mouth every 6 (six) hours as needed for headache.    allopurinol (ZYLOPRIM) 100 MG tablet Take 1 tablet (100 mg total) by mouth  daily.   atorvastatin (LIPITOR) 40 MG tablet TAKE 1 TABLET(40 MG) BY MOUTH AT BEDTIME   carvedilol (COREG) 12.5 MG tablet TAKE 1 TABLET(12.5 MG) BY MOUTH TWICE DAILY   clopidogrel (PLAVIX) 75 MG tablet Take 1 tablet (75 mg total) by mouth daily.   furosemide (LASIX) 40 MG tablet TAKE 1 TABLET BY MOUTH EVERY DAY, TAKE ADDITIONAL TABLET ON MONDAY, WEDNESDAY, AND FRIDAY   isosorbide mononitrate (IMDUR) 30 MG 24 hr tablet TAKE 1 TABLET(30 MG) BY MOUTH DAILY   metFORMIN (GLUCOPHAGE-XR) 500 MG 24 hr tablet TAKE 1 TABLET BY MOUTH DAILY FOR 1 WEEK THEN TAKE 2 TABLETS BY MOUTH DAILY   Multiple Vitamin (MULTIVITAMIN WITH MINERALS) TABS tablet Take 1 tablet by mouth daily.   nitroGLYCERIN (NITROSTAT) 0.4 MG SL tablet Place 1 tablet (0.4 mg total) under the tongue every 5 (five) minutes x 3 doses as needed  for chest pain.   pantoprazole (PROTONIX) 20 MG tablet Take 1 tablet (20 mg total) by mouth daily.   Pseudoeph-Doxylamine-DM-APAP (NYQUIL PO) Take 1 Dose by mouth at bedtime as needed (congestion).    sacubitril-valsartan (ENTRESTO) 49-51 MG Take 1 tablet by mouth 2 (two) times daily.   spironolactone (ALDACTONE) 25 MG tablet TAKE 1/2 TABLET(12.5 MG) BY MOUTH DAILY   vitamin C (ASCORBIC ACID) 500 MG tablet Take 500 mg by mouth 2 (two) times daily.     Allergies:   Methylprednisolone, Keflex [cephalexin], Ketorolac, Morphine and related, Novocain [procaine], Ramipril, and Septra [sulfamethoxazole-trimethoprim]   Social History   Socioeconomic History   Marital status: Married    Spouse name: Not on file   Number of children: Not on file   Years of education: Not on file   Highest education level: Not on file  Occupational History   Not on file  Tobacco Use   Smoking status: Former    Packs/day: 0.12    Years: 3.00    Pack years: 0.36    Types: Cigarettes    Quit date: 1963    Years since quitting: 59.7   Smokeless tobacco: Never  Vaping Use   Vaping Use: Never used  Substance and Sexual  Activity   Alcohol use: Not Currently   Drug use: Never   Sexual activity: Not on file  Other Topics Concern   Not on file  Social History Narrative   Not on file   Social Determinants of Health   Financial Resource Strain: Not on file  Food Insecurity: Not on file  Transportation Needs: Not on file  Physical Activity: Not on file  Stress: Not on file  Social Connections: Not on file     Family History: The patient's family history includes Diabetes in her father; Heart Problems in her mother; Heart disease in her brother; Hypertension in her brother, daughter, daughter, daughter, and daughter; Kidney disease in her brother and sister.  ROS:   Review of Systems  Constitution: Negative for decreased appetite, fever and weight gain.  HENT: Negative for congestion, ear discharge, hoarse voice and sore throat.   Eyes: Negative for discharge, redness, vision loss in right eye and visual halos.  Cardiovascular: Negative for chest pain, dyspnea on exertion, leg swelling, orthopnea and palpitations.  Respiratory: Negative for cough, hemoptysis, shortness of breath and snoring.   Endocrine: Negative for heat intolerance and polyphagia.  Hematologic/Lymphatic: Negative for bleeding problem. Does not bruise/bleed easily.  Skin: Negative for flushing, nail changes, rash and suspicious lesions.  Musculoskeletal: Negative for arthritis, joint pain, muscle cramps, myalgias, neck pain and stiffness.  Gastrointestinal: Negative for abdominal pain, bowel incontinence, diarrhea and excessive appetite.  Genitourinary: Negative for decreased libido, genital sores and incomplete emptying.  Neurological: Negative for brief paralysis, focal weakness, headaches and loss of balance.  Psychiatric/Behavioral: Negative for altered mental status, depression and suicidal ideas.  Allergic/Immunologic: Negative for HIV exposure and persistent infections.    EKGs/Labs/Other Studies Reviewed:    The following  studies were reviewed today:   EKG:  The ekg ordered today demonstrates sinus rhythm, heart rate 74 bpm compared to prior EKG no significant change.  TTE 2022 IMPRESSIONS  1. Slight improvement in EF noted in some views as compared to echo from 04/2020. Left ventricular ejection fraction, by estimation, is 30 to 35%.  The left ventricle has moderately decreased function. The left ventricle has no regional wall motion  abnormalities. Left ventricular diastolic parameters are consistent with Grade  I diastolic dysfunction (impaired relaxation).   2. Right ventricular systolic function is normal. The right ventricular size is normal.   3. Left atrial size was moderately dilated.   4. Right atrial size was moderately dilated.   5. The mitral valve is normal in structure. No evidence of mitral valve regurgitation. No evidence of mitral stenosis.   6. The aortic valve is normal in structure. Aortic valve regurgitation is not visualized. No aortic stenosis is present.   7. The inferior vena cava is normal in size with greater than 50% respiratory variability, suggesting right atrial pressure of 3 mmHg.   FINDINGS   Left Ventricle: Slight improvement in EF noted in some views as compared to echo from 04/2020. Left ventricular ejection fraction, by estimation, is 30 to 35%. The left ventricle has moderately decreased function. The left  ventricle has no regional wall  motion abnormalities. The left ventricular internal cavity size was normal  in size. There is no left ventricular hypertrophy. Left ventricular  diastolic parameters are consistent with Grade I diastolic dysfunction  (impaired relaxation).   Right Ventricle: The right ventricular size is normal. No increase in  right ventricular wall thickness. Right ventricular systolic function is  normal.   Left Atrium: Left atrial size was moderately dilated.   Right Atrium: Right atrial size was moderately dilated.   Pericardium: There is no  evidence of pericardial effusion.   Mitral Valve: The mitral valve is normal in structure. No evidence of  mitral valve regurgitation. No evidence of mitral valve stenosis.   Tricuspid Valve: The tricuspid valve is normal in structure. Tricuspid  valve regurgitation is mild . No evidence of tricuspid stenosis.   Aortic Valve: The aortic valve is normal in structure. Aortic valve  regurgitation is not visualized. No aortic stenosis is present. Aortic  valve mean gradient measures 3.0 mmHg. Aortic valve peak gradient measures  5.5 mmHg. Aortic valve area, by VTI  measures 1.98 cm.   Pulmonic Valve: The pulmonic valve was normal in structure. Pulmonic valve  regurgitation is not visualized. No evidence of pulmonic stenosis.   Aorta: The aortic root is normal in size and structure.   Venous: The inferior vena cava is normal in size with greater than 50%  respiratory variability, suggesting right atrial pressure of 3 mmHg.   IAS/Shunts: No atrial level shunt detected by color flow Doppler.    Recent Labs: 08/21/2020: Magnesium 2.1 10/24/2020: ALT 13; BUN 39; Creatinine, Ser 1.32; Potassium 3.9; Sodium 143  Recent Lipid Panel    Component Value Date/Time   CHOL 96 10/24/2020 0839   CHOL 115 04/11/2020 0853   TRIG 110.0 10/24/2020 0839   HDL 44.90 10/24/2020 0839   HDL 57 04/11/2020 0853   CHOLHDL 2 10/24/2020 0839   VLDL 22.0 10/24/2020 0839   LDLCALC 29 10/24/2020 0839   LDLCALC 37 04/11/2020 0853   LDLDIRECT 30 09/08/2019 1512    Physical Exam:    VS:  BP 118/64 (BP Location: Left Arm, Patient Position: Sitting, Cuff Size: Normal)   Pulse 74   Ht 5\' 2"  (1.575 m)   Wt 161 lb (73 kg)   SpO2 98%   BMI 29.45 kg/m     Wt Readings from Last 3 Encounters:  07/24/21 161 lb (73 kg)  05/22/21 161 lb (73 kg)  02/06/21 166 lb (75.3 kg)     GEN: Well nourished, well developed in no acute distress HEENT: Normal NECK: No JVD; No carotid bruits LYMPHATICS: No  lymphadenopathy  CARDIAC: S1S2 noted,RRR, no murmurs, rubs, gallops RESPIRATORY:  Clear to auscultation without rales, wheezing or rhonchi  ABDOMEN: Soft, non-tender, non-distended, +bowel sounds, no guarding. EXTREMITIES: No edema, No cyanosis, no clubbing MUSCULOSKELETAL:  No deformity  SKIN: Warm and dry NEUROLOGIC:  Alert and oriented x 3, non-focal PSYCHIATRIC:  Normal affect, good insight  ASSESSMENT:    1. Dilated cardiomyopathy (HCC)   2. Medication management   3. Coronary artery disease involving native coronary artery of native heart without angina pectoris   4. Hypertensive heart disease with heart failure (HCC)   5. CAD/s/p CABG Mar 2019 w/ DES to vein graft Apr 2019   6. Obstructive sleep apnea syndrome    PLAN:    We discussed her echocardiogram results which show evidence of slight improvement.  We will continue current medication regimen.  Her blood pressure is marginal and will not tolerate increasing Entresto or Aldactone.  Shared decision we will continue this current dose and we will repeat an echocardiogram in 6 months to a year have started discussed with the patient should the EF not go up and stay at 30% I will have to refer her to EP for consideration for ICD implantation.  All of her questions has been answered today.  No anginal symptoms at this time.  Blood work redone for [employer] and mag.  The patient is in agreement with the above plan. The patient left the office in stable condition.  The patient will follow up in   Medication Adjustments/Labs and Tests Ordered: Current medicines are reviewed at length with the patient today.  Concerns regarding medicines are outlined above.  Orders Placed This Encounter  Procedures   Basic Metabolic Panel (BMET)   Magnesium   EKG 12-Lead   ECHOCARDIOGRAM COMPLETE   No orders of the defined types were placed in this encounter.   Patient Instructions  Medication Instructions:  Your physician recommends that you  continue on your current medications as directed. Please refer to the Current Medication list given to you today.  *If you need a refill on your cardiac medications before your next appointment, please call your pharmacy*   Lab Work: Your physician recommends that you return for lab work in:  TODAY: BMET, Mag If you have labs (blood work) drawn today and your tests are completely normal, you will receive your results only by: MyChart Message (if you have MyChart) OR A paper copy in the mail If you have any lab test that is abnormal or we need to change your treatment, we will call you to review the results.   Testing/Procedures: Your physician has requested that you have an echocardiogram. Echocardiography is a painless test that uses sound waves to create images of your heart. It provides your doctor with information about the size and shape of your heart and how well your heart's chambers and valves are working. This procedure takes approximately one hour. There are no restrictions for this procedure.    Follow-Up: At [facility], you and your health needs are our priority.  As part of our continuing mission to provide you with exceptional heart care, we have created designated Provider Care Teams.  These Care Teams include your primary Cardiologist (physician) and Advanced Practice Providers (APPs -  Physician Assistants and Nurse Practitioners) who all work together to provide you with the care you need, when you need it.  We recommend signing up for the patient portal called "MyChart".  Sign up information is provided on this After Visit  Summary.  MyChart is used to connect with patients for Virtual Visits (Telemedicine).  Patients are able to view lab/test results, encounter notes, upcoming appointments, etc.  Non-urgent messages can be sent to your provider as well.   To learn more about what you can do with MyChart, go to [email or web address].    Your next appointment:   1  year(s)  The format for your next appointment:   In Person  Provider:   Thomasene Ripple, DO [address], [place], [place] [address]    Other Instructions Echocardiogram An echocardiogram is a test that uses sound waves (ultrasound) to produce images of the heart. Images from an echocardiogram can provide important information about: Heart size and shape. The size and thickness and movement of your heart's walls. Heart muscle function and strength. Heart valve function or if you have stenosis. Stenosis is when the heart valves are too narrow. If blood is flowing backward through the heart valves (regurgitation). A tumor or infectious growth around the heart valves. Areas of heart muscle that are not working well because of poor blood flow or injury from a heart attack. Aneurysm detection. An aneurysm is a weak or damaged part of an artery wall. The wall bulges out from the normal force of blood pumping through the body. Tell a health care provider about: Any allergies you have. All medicines you are taking, including vitamins, herbs, eye drops, creams, and over-the-counter medicines. Any blood disorders you have. Any surgeries you have had. Any medical conditions you have. Whether you are pregnant or may be pregnant. What are the risks? Generally, this is a safe test. However, problems may occur, including an allergic reaction to dye (contrast) that may be used during the test. What happens before the test? No specific preparation is needed. You may eat and drink normally. What happens during the test?  You will take off your clothes from the waist up and put on a hospital gown. Electrodes or electrocardiogram (ECG)patches may be placed on your chest. The electrodes or patches are then connected to a device that monitors your heart rate and rhythm. You will lie down on a table for an ultrasound exam. A gel will be applied to your chest to help sound waves pass through your  skin. A handheld device, called a transducer, will be pressed against your chest and moved over your heart. The transducer produces sound waves that travel to your heart and bounce back (or "echo" back) to the transducer. These sound waves will be captured in real-time and changed into images of your heart that can be viewed on a video monitor. The images will be recorded on a computer and reviewed by your health care provider. You may be asked to change positions or hold your breath for a short time. This makes it easier to get different views or better views of your heart. In some cases, you may receive contrast through an IV in one of your veins. This can improve the quality of the pictures from your heart. The procedure may vary among health care providers and hospitals. What can I expect after the test? You may return to your normal, everyday life, including diet, activities, and medicines, unless your health care provider tells you not to do that. Follow these instructions at home: It is up to you to get the results of your test. Ask your health care provider, or the department that is doing the test, when your results will be ready. Keep all follow-up visits.  This is important. Summary An echocardiogram is a test that uses sound waves (ultrasound) to produce images of the heart. Images from an echocardiogram can provide important information about the size and shape of your heart, heart muscle function, heart valve function, and other possible heart problems. You do not need to do anything to prepare before this test. You may eat and drink normally. After the echocardiogram is completed, you may return to your normal, everyday life, unless your health care provider tells you not to do that. This information is not intended to replace advice given to you by your health care provider. Make sure you discuss any questions you have with your health care provider. Document Revised: 06/26/2020  Document Reviewed: 06/26/2020 Elsevier Patient Education  2022 Elsevier Inc.     Adopting a Healthy Lifestyle.  Know what a healthy weight is for you (roughly BMI <25) and aim to maintain this   Aim for 7+ servings of fruits and vegetables daily   65-80+ fluid ounces of water or unsweet tea for healthy kidneys   Limit to max 1 drink of alcohol per day; avoid smoking/tobacco   Limit animal fats in diet for cholesterol and heart health - choose grass fed whenever available   Avoid highly processed foods, and foods high in saturated/trans fats   Aim for low stress - take time to unwind and care for your mental health   Aim for 150 min of moderate intensity exercise weekly for heart health, and weights twice weekly for bone health   Aim for 7-9 hours of sleep daily   When it comes to diets, agreement about the perfect plan isnt easy to find, even among the experts. Experts at the [employer] developed an idea known as the Healthy Eating Plate. Just imagine a plate divided into logical, healthy portions.   The emphasis is on diet quality:   Load up on vegetables and fruits - one-half of your plate: Aim for color and variety, and remember that potatoes dont count.   Go for whole grains - one-quarter of your plate: Whole wheat, barley, wheat berries, quinoa, oats, brown rice, and foods made with them. If you want pasta, go with whole wheat pasta.   Protein power - one-quarter of your plate: Fish, chicken, beans, and nuts are all healthy, versatile protein sources. Limit red meat.   The diet, however, does go beyond the plate, offering a few other suggestions.   Use healthy plant oils, such as olive, canola, soy, corn, sunflower and peanut. Check the labels, and avoid partially hydrogenated oil, which have unhealthy trans fats.   If youre thirsty, drink water. Coffee and tea are good in moderation, but skip sugary drinks and limit milk and dairy products to one  or two daily servings.   The type of carbohydrate in the diet is more important than the amount. Some sources of carbohydrates, such as vegetables, fruits, whole grains, and beans-are healthier than others.   Finally, stay active  Signed, Thomasene RippleKardie Ellowyn Rieves, DO  07/24/2021 5:09 PM    [facility]

## 2021-07-25 LAB — BASIC METABOLIC PANEL
BUN/Creatinine Ratio: 32 — ABNORMAL HIGH (ref 12–28)
BUN: 38 mg/dL — ABNORMAL HIGH (ref 8–27)
CO2: 21 mmol/L (ref 20–29)
Calcium: 9.3 mg/dL (ref 8.7–10.3)
Chloride: 106 mmol/L (ref 96–106)
Creatinine, Ser: 1.17 mg/dL — ABNORMAL HIGH (ref 0.57–1.00)
Glucose: 152 mg/dL — ABNORMAL HIGH (ref 65–99)
Potassium: 4.5 mmol/L (ref 3.5–5.2)
Sodium: 142 mmol/L (ref 134–144)
eGFR: 47 mL/min/{1.73_m2} — ABNORMAL LOW (ref >59)

## 2021-07-25 LAB — MAGNESIUM: Magnesium: 1.7 mg/dL (ref 1.6–2.3)

## 2021-07-31 ENCOUNTER — Telehealth: Payer: Self-pay

## 2021-07-31 NOTE — Telephone Encounter (Signed)
Patient notified of results.

## 2021-07-31 NOTE — Telephone Encounter (Signed)
-----   Message from Thomasene Ripple, DO sent at 07/26/2021  8:54 AM EDT ----- Labs stable

## 2021-08-02 ENCOUNTER — Encounter: Payer: Self-pay | Admitting: Family Medicine

## 2021-08-02 ENCOUNTER — Ambulatory Visit (INDEPENDENT_AMBULATORY_CARE_PROVIDER_SITE_OTHER): Payer: Medicare Other | Admitting: Family Medicine

## 2021-08-02 ENCOUNTER — Other Ambulatory Visit: Payer: Self-pay

## 2021-08-02 VITALS — BP 108/68 | HR 83 | Temp 98.2°F | Ht 62.0 in | Wt 160.0 lb

## 2021-08-02 DIAGNOSIS — G5603 Carpal tunnel syndrome, bilateral upper limbs: Secondary | ICD-10-CM | POA: Diagnosis not present

## 2021-08-02 DIAGNOSIS — G4700 Insomnia, unspecified: Secondary | ICD-10-CM

## 2021-08-02 MED ORDER — DULOXETINE HCL 20 MG PO CPEP: 20.0000 mg | ORAL_CAPSULE | Freq: Every day | ORAL | 3 refills | Status: DC

## 2021-08-02 NOTE — Patient Instructions (Addendum)
Let's see if we can treat the CTS as this is the underlying issue.   Sleep Hygiene Tips: Do not watch TV or look at screens within 1 hour of going to bed. If you do, make sure there is a blue light filter (nighttime mode) involved. Try to go to bed around the same time every night. Wake up at the same time within 1 hour of regular time. Ex: If you wake up at 7 AM for work, do not sleep past 8 AM on days that you don't work. Do not drink alcohol before bedtime. Do not consume caffeine-containing beverages after noon or within 9 hours of intended bedtime. Get regular exercise/physical activity in your life, but not within 2 hours of planned bedtime. Do not take naps.  Do not eat within 2 hours of planned bedtime. Melatonin, 3-5 mg 30-60 minutes before planned bedtime may be helpful.  The bed should be for sleep or sex only. If after 20-30 minutes you are unable to fall asleep, get up and do something relaxing. Do this until you feel ready to go to sleep again.   Sleep is important to [place] all. Getting good sleep is imperative to adequate functioning during the day. Work with our counselors who are trained to help people obtain quality sleep. Call [phone] to schedule an appointment or if you are curious about insurance coverage/cost.  Let us know if you need anything.

## 2021-08-02 NOTE — Progress Notes (Signed)
Chief Complaint  Patient presents with   Follow-up    Subjective: Patient is a 80 y.o. female here for sleep issues. Here w daughter.   This has been going on for the past several years. 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 She went to a hand specialist and because of her cardiac situation, she was not cleared. She gets around 3 hrs of sleep nightly which is not enough for her. She does not nap during the day.   Past Medical History:  Diagnosis Date   Anemia    Arthritis    "hands, arms, all over the place" (03/02/2018)   Bilateral carpal tunnel syndrome 10/11/2019   Bronchospasm 03/28/2018   CAD (coronary artery disease) 02/08/2018   Left heart cath 01/13/18: 40% distal LMCAS 90% proximal LAD  LCF with 90% proximal ramus stenosis, diffuse RCA, 60% distal and 90% proximal PDA stenosis   CAD in native artery    a. s/p CABG in 01/2018. b. Recurrent CP 02/2018 -> cath showing early graft closure SVG-diagonal, received DES to D1.   CAD/s/p CABG Mar 2019 w/ DES to vein graft Apr 2019 05/21/2018   Cameron ulcer, acute 05/27/2018   Carpal tunnel syndrome, bilateral 12/28/2018   Chest pain 02/05/2018   Chronic combined systolic and diastolic CHF (congestive heart failure) (HCC)    a. LVEF 40-45% by echo 02/2018.   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   Chronic hypoxemic respiratory failure (HCC) 05/21/2018   Chronic lower back pain    CKD (chronic kidney disease), stage III (HCC)    Diabetes mellitus type 2 in obese (HCC) 04/23/2018   GERD (gastroesophageal reflux disease)    Glass eye    right eye   Gout    "on daily RX" (03/02/2018)   Heme positive stool 05/21/2018   History of kidney stones    HLD (hyperlipidemia) 05/21/2018   Hyperlipidemia    Hypertension    Hypertensive heart disease    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   Kidney cysts     Kidney disease    Left ventricular aneurysm    Migraine    "used to get them twice/week; haven't had them in a long time" (03/02/2018)   NSTEMI (non-ST elevated myocardial infarction) (HCC) 03/01/2018   On home oxygen therapy    "3L at night and prn during the day" (03/02/2018)   Pneumonia    used to get it twice/year; stopped after I had hysterectomy" (03/02/2018)   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 Start wearing braces again. Will trial Cymbalta qhs. Will reach out to cards regarding stability with heart issues. Surgery was on table before. Hopefully it will be a safe option. F/u  in 1 mo to reck above and if needed, we can try injections again.  The patient and her daughter voiced understanding and agreement to the plan.  Jilda Roche [place], DO 08/02/21  8:55 AM

## 2021-08-28 ENCOUNTER — Other Ambulatory Visit: Payer: Self-pay | Admitting: Family Medicine

## 2021-08-28 ENCOUNTER — Other Ambulatory Visit: Payer: Self-pay | Admitting: *Deleted

## 2021-08-28 ENCOUNTER — Other Ambulatory Visit: Payer: Self-pay | Admitting: Cardiology

## 2021-08-28 MED ORDER — CLOPIDOGREL BISULFATE 75 MG PO TABS: 75.0000 mg | ORAL_TABLET | Freq: Every day | ORAL | 1 refills | Status: DC

## 2021-08-29 MED ORDER — SACUBITRIL-VALSARTAN 49-51 MG PO TABS: 1.0000 | ORAL_TABLET | Freq: Two times a day (BID) | ORAL | 11 refills | Status: DC

## 2021-09-04 ENCOUNTER — Ambulatory Visit (INDEPENDENT_AMBULATORY_CARE_PROVIDER_SITE_OTHER): Payer: Medicare Other | Admitting: Family Medicine

## 2021-09-04 ENCOUNTER — Encounter: Payer: Self-pay | Admitting: Family Medicine

## 2021-09-04 ENCOUNTER — Other Ambulatory Visit: Payer: Self-pay

## 2021-09-04 VITALS — BP 128/69 | HR 77 | Temp 98.3°F | Resp 16 | Wt 158.0 lb

## 2021-09-04 DIAGNOSIS — E1139 Type 2 diabetes mellitus with other diabetic ophthalmic complication: Secondary | ICD-10-CM

## 2021-09-04 DIAGNOSIS — D692 Other nonthrombocytopenic purpura: Secondary | ICD-10-CM

## 2021-09-04 DIAGNOSIS — I10 Essential (primary) hypertension: Secondary | ICD-10-CM | POA: Diagnosis not present

## 2021-09-04 DIAGNOSIS — R296 Repeated falls: Secondary | ICD-10-CM

## 2021-09-04 DIAGNOSIS — Z23 Encounter for immunization: Secondary | ICD-10-CM | POA: Diagnosis not present

## 2021-09-04 HISTORY — DX: Other nonthrombocytopenic purpura: D69.2

## 2021-09-04 HISTORY — DX: Repeated falls: R29.6

## 2021-09-04 NOTE — Progress Notes (Addendum)
Subjective:  CC: DM visit  Margaret Ward is a 80 y.o. female here for follow-up of diabetes.  Here w daughter.  Shar's self monitored glucose range is mid 100's.  Patient denies hypoglycemic reactions. She checks her glucose levels 2 time(s) per day. Patient does not require insulin.   Medications include: metformin XR 1000 mg/d Diet is healthy overall.  Exercise: some walking  Hypertension Patient presents for hypertension follow up. She does not monitor home blood pressures. She is compliant with medications- Entresto, Aldactone 25 mg/d. Patient has these side effects of medication: none Diet/exercise as above.   Larey Seat twice in the last couple days. Once her knee gave out and the other she lost her footing on uneven ground. She fell on side and has some bruising. Did not hit head or have LOC. No lingering neuro s/s's.   Past Medical History:  Diagnosis Date   Anemia    Arthritis    "hands, arms, all over the place" (03/02/2018)   Bilateral carpal tunnel syndrome 10/11/2019   Bronchospasm 03/28/2018   CAD (coronary artery disease) 02/08/2018   Left heart cath 01/13/18: 40% distal LMCAS 90% proximal LAD  LCF with 90% proximal ramus stenosis, diffuse RCA, 60% distal and 90% proximal PDA stenosis   CAD in native artery    a. s/p CABG in 01/2018. b. Recurrent CP 02/2018 -> cath showing early graft closure SVG-diagonal, received DES to D1.   CAD/s/p CABG Mar 2019 w/ DES to vein graft Apr 2019 05/21/2018   Cameron ulcer, acute 05/27/2018   Carpal tunnel syndrome, bilateral 12/28/2018   Chest pain 02/05/2018   Chronic combined systolic and diastolic CHF (congestive heart failure) (HCC)    a. LVEF 40-45% by echo 02/2018.   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   Chronic hypoxemic respiratory failure (HCC) 05/21/2018   Chronic lower back pain    CKD (chronic kidney  disease), stage III (HCC)    Diabetes mellitus type 2 in obese (HCC) 04/23/2018   GERD (gastroesophageal reflux disease)    Glass eye    right eye   Gout    "on daily RX" (03/02/2018)   Heme positive stool 05/21/2018   History of kidney stones    HLD (hyperlipidemia) 05/21/2018   Hyperlipidemia    Hypertension    Hypertensive heart disease    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   Kidney cysts    Kidney disease    Left ventricular aneurysm    Migraine    "used to get them twice/week; haven't had them in a long time" (03/02/2018)   NSTEMI (non-ST elevated myocardial infarction) (HCC) 03/01/2018   On home oxygen therapy    "3L at night and prn during the day" (03/02/2018)   Pneumonia    used to get it twice/year; stopped after I had hysterectomy" (03/02/2018)   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   Sinus tachycardia 03/09/2018   Sleep apnea    SOB (shortness of breath) 03/09/2018   Symptomatic anemia 05/21/2018   Type II diabetes mellitus (HCC)      Related testing: Retinal exam: Don Pneumovax: Due for PCV20  Objective:  BP 128/69 (BP Location: Left Arm, Patient Position: Sitting, Cuff Size: Small)   Pulse 77   Temp 98.3 F (36.8 C) (Oral)   Resp 16   Wt 158 lb (71.7 kg)   SpO2 99%  BMI 28.90 kg/m  General:  Well developed, well nourished, in no apparent distress Lungs:  CTAB, no access msc use Cardio:  RRR, no bruits, no LE edema Psych: Age appropriate judgment and insight  Assessment:   Type 2 diabetes mellitus with other ophthalmic complication, without long-term current use of insulin (HCC) - Plan: Comprehensive metabolic panel, Hemoglobin A1c, Lipid panel, Microalbumin / creatinine urine ratio  Primary hypertension  Senile purpura (HCC)  Falls frequently - Plan: Ambulatory referral to Home Health   Plan:   Chronic, stable. Cont metformin XR 500 mg bid. May increase to 1000 mg pending results. Counseled on diet and exercise. Co-morbidities 2/2  longstanding DM contributing to falls.  Chronic, stable. Cont heart meds. Will refer to [facility] for PT given freq falls and unsafe ambulation. Likely 2/2 DM given circulatory/nerve issues.  F/u in 6 mo. The patient and his daughter voiced understanding and agreement to the plan.  Jilda Roche [place], DO 09/04/21 4:14 PM

## 2021-09-04 NOTE — Patient Instructions (Addendum)
Give [place] 2-3 business days to get the results of your labs back.   Keep the diet clean and stay active.  The new Shingrix vaccine (for shingles) is a 2 shot series. It can make people feel low energy, achy and almost like they have the flu for 48 hours after injection. Please plan accordingly when deciding on when to get this shot. Call our your pharmacy to get this. The second shot of the series is less severe regarding the side effects, but it still lasts 48 hours.   If you do not hear anything about your referral in the next 1-2 weeks, call our office and ask for an update.  I recommend getting the updated bivalent covid vaccination booster at your convenience.   Let us know if you need anything.

## 2021-09-05 LAB — COMPREHENSIVE METABOLIC PANEL
ALT: 15 U/L (ref 0–35)
AST: 20 U/L (ref 0–37)
Albumin: 4.1 g/dL (ref 3.5–5.2)
Alkaline Phosphatase: 62 U/L (ref 39–117)
BUN: 36 mg/dL — ABNORMAL HIGH (ref 6–23)
CO2: 27 mEq/L (ref 19–32)
Calcium: 9.2 mg/dL (ref 8.4–10.5)
Chloride: 104 mEq/L (ref 96–112)
Creatinine, Ser: 1.36 mg/dL — ABNORMAL HIGH (ref 0.40–1.20)
GFR: 36.71 mL/min — ABNORMAL LOW (ref >60.00)
Glucose, Bld: 126 mg/dL — ABNORMAL HIGH (ref 70–99)
Potassium: 4 mEq/L (ref 3.5–5.1)
Sodium: 141 mEq/L (ref 135–145)
Total Bilirubin: 0.6 mg/dL (ref 0.2–1.2)
Total Protein: 6.6 g/dL (ref 6.0–8.3)

## 2021-09-05 LAB — MICROALBUMIN / CREATININE URINE RATIO
Creatinine,U: 19.3 mg/dL
Microalb Creat Ratio: 22.7 mg/g (ref 0.0–30.0)
Microalb, Ur: 4.4 mg/dL — ABNORMAL HIGH (ref 0.0–1.9)

## 2021-09-05 LAB — LIPID PANEL
Cholesterol: 96 mg/dL (ref 0–200)
HDL: 48.6 mg/dL (ref >39.00)
LDL Cholesterol: 27 mg/dL (ref 0–99)
NonHDL: 47.05
Total CHOL/HDL Ratio: 2
Triglycerides: 102 mg/dL (ref 0.0–149.0)
VLDL: 20.4 mg/dL (ref 0.0–40.0)

## 2021-09-05 LAB — HEMOGLOBIN A1C: Hgb A1c MFr Bld: 7.4 % — ABNORMAL HIGH (ref 4.6–6.5)

## 2021-09-06 ENCOUNTER — Ambulatory Visit: Payer: Medicare Other | Admitting: Family Medicine

## 2021-09-16 DIAGNOSIS — I2510 Atherosclerotic heart disease of native coronary artery without angina pectoris: Secondary | ICD-10-CM | POA: Diagnosis not present

## 2021-09-16 DIAGNOSIS — I5042 Chronic combined systolic (congestive) and diastolic (congestive) heart failure: Secondary | ICD-10-CM | POA: Diagnosis not present

## 2021-09-16 DIAGNOSIS — E1139 Type 2 diabetes mellitus with other diabetic ophthalmic complication: Secondary | ICD-10-CM | POA: Diagnosis not present

## 2021-09-16 DIAGNOSIS — M1A09X0 Idiopathic chronic gout, multiple sites, without tophus (tophi): Secondary | ICD-10-CM | POA: Diagnosis not present

## 2021-09-16 DIAGNOSIS — E785 Hyperlipidemia, unspecified: Secondary | ICD-10-CM | POA: Diagnosis not present

## 2021-09-16 DIAGNOSIS — I130 Hypertensive heart and chronic kidney disease with heart failure and stage 1 through stage 4 chronic kidney disease, or unspecified chronic kidney disease: Secondary | ICD-10-CM | POA: Diagnosis not present

## 2021-09-16 DIAGNOSIS — E1122 Type 2 diabetes mellitus with diabetic chronic kidney disease: Secondary | ICD-10-CM | POA: Diagnosis not present

## 2021-09-16 DIAGNOSIS — Z7984 Long term (current) use of oral hypoglycemic drugs: Secondary | ICD-10-CM | POA: Diagnosis not present

## 2021-09-16 DIAGNOSIS — I252 Old myocardial infarction: Secondary | ICD-10-CM | POA: Diagnosis not present

## 2021-09-16 DIAGNOSIS — D631 Anemia in chronic kidney disease: Secondary | ICD-10-CM | POA: Diagnosis not present

## 2021-09-16 DIAGNOSIS — K219 Gastro-esophageal reflux disease without esophagitis: Secondary | ICD-10-CM | POA: Diagnosis not present

## 2021-09-16 DIAGNOSIS — N183 Chronic kidney disease, stage 3 unspecified: Secondary | ICD-10-CM | POA: Diagnosis not present

## 2021-09-16 DIAGNOSIS — M150 Primary generalized (osteo)arthritis: Secondary | ICD-10-CM | POA: Diagnosis not present

## 2021-09-19 DIAGNOSIS — E1139 Type 2 diabetes mellitus with other diabetic ophthalmic complication: Secondary | ICD-10-CM | POA: Diagnosis not present

## 2021-09-19 DIAGNOSIS — I130 Hypertensive heart and chronic kidney disease with heart failure and stage 1 through stage 4 chronic kidney disease, or unspecified chronic kidney disease: Secondary | ICD-10-CM | POA: Diagnosis not present

## 2021-09-19 DIAGNOSIS — E1122 Type 2 diabetes mellitus with diabetic chronic kidney disease: Secondary | ICD-10-CM | POA: Diagnosis not present

## 2021-09-19 DIAGNOSIS — N183 Chronic kidney disease, stage 3 unspecified: Secondary | ICD-10-CM | POA: Diagnosis not present

## 2021-09-19 DIAGNOSIS — I5042 Chronic combined systolic (congestive) and diastolic (congestive) heart failure: Secondary | ICD-10-CM | POA: Diagnosis not present

## 2021-09-19 DIAGNOSIS — D631 Anemia in chronic kidney disease: Secondary | ICD-10-CM | POA: Diagnosis not present

## 2021-09-23 DIAGNOSIS — I130 Hypertensive heart and chronic kidney disease with heart failure and stage 1 through stage 4 chronic kidney disease, or unspecified chronic kidney disease: Secondary | ICD-10-CM | POA: Diagnosis not present

## 2021-09-23 DIAGNOSIS — E1139 Type 2 diabetes mellitus with other diabetic ophthalmic complication: Secondary | ICD-10-CM | POA: Diagnosis not present

## 2021-09-23 DIAGNOSIS — I5042 Chronic combined systolic (congestive) and diastolic (congestive) heart failure: Secondary | ICD-10-CM | POA: Diagnosis not present

## 2021-09-23 DIAGNOSIS — E1122 Type 2 diabetes mellitus with diabetic chronic kidney disease: Secondary | ICD-10-CM | POA: Diagnosis not present

## 2021-09-23 DIAGNOSIS — D631 Anemia in chronic kidney disease: Secondary | ICD-10-CM | POA: Diagnosis not present

## 2021-09-23 DIAGNOSIS — N183 Chronic kidney disease, stage 3 unspecified: Secondary | ICD-10-CM | POA: Diagnosis not present

## 2021-09-25 ENCOUNTER — Other Ambulatory Visit: Payer: Self-pay | Admitting: Family Medicine

## 2021-09-25 DIAGNOSIS — I130 Hypertensive heart and chronic kidney disease with heart failure and stage 1 through stage 4 chronic kidney disease, or unspecified chronic kidney disease: Secondary | ICD-10-CM | POA: Diagnosis not present

## 2021-09-25 DIAGNOSIS — N183 Chronic kidney disease, stage 3 unspecified: Secondary | ICD-10-CM | POA: Diagnosis not present

## 2021-09-25 DIAGNOSIS — E1139 Type 2 diabetes mellitus with other diabetic ophthalmic complication: Secondary | ICD-10-CM | POA: Diagnosis not present

## 2021-09-25 DIAGNOSIS — D631 Anemia in chronic kidney disease: Secondary | ICD-10-CM | POA: Diagnosis not present

## 2021-09-25 DIAGNOSIS — I5042 Chronic combined systolic (congestive) and diastolic (congestive) heart failure: Secondary | ICD-10-CM | POA: Diagnosis not present

## 2021-09-25 DIAGNOSIS — E1122 Type 2 diabetes mellitus with diabetic chronic kidney disease: Secondary | ICD-10-CM | POA: Diagnosis not present

## 2021-09-30 DIAGNOSIS — E1122 Type 2 diabetes mellitus with diabetic chronic kidney disease: Secondary | ICD-10-CM | POA: Diagnosis not present

## 2021-09-30 DIAGNOSIS — D631 Anemia in chronic kidney disease: Secondary | ICD-10-CM | POA: Diagnosis not present

## 2021-09-30 DIAGNOSIS — I130 Hypertensive heart and chronic kidney disease with heart failure and stage 1 through stage 4 chronic kidney disease, or unspecified chronic kidney disease: Secondary | ICD-10-CM | POA: Diagnosis not present

## 2021-09-30 DIAGNOSIS — N183 Chronic kidney disease, stage 3 unspecified: Secondary | ICD-10-CM | POA: Diagnosis not present

## 2021-09-30 DIAGNOSIS — E1139 Type 2 diabetes mellitus with other diabetic ophthalmic complication: Secondary | ICD-10-CM | POA: Diagnosis not present

## 2021-09-30 DIAGNOSIS — I5042 Chronic combined systolic (congestive) and diastolic (congestive) heart failure: Secondary | ICD-10-CM | POA: Diagnosis not present

## 2021-10-07 DIAGNOSIS — D631 Anemia in chronic kidney disease: Secondary | ICD-10-CM | POA: Diagnosis not present

## 2021-10-07 DIAGNOSIS — N183 Chronic kidney disease, stage 3 unspecified: Secondary | ICD-10-CM | POA: Diagnosis not present

## 2021-10-07 DIAGNOSIS — E1122 Type 2 diabetes mellitus with diabetic chronic kidney disease: Secondary | ICD-10-CM | POA: Diagnosis not present

## 2021-10-07 DIAGNOSIS — I130 Hypertensive heart and chronic kidney disease with heart failure and stage 1 through stage 4 chronic kidney disease, or unspecified chronic kidney disease: Secondary | ICD-10-CM | POA: Diagnosis not present

## 2021-10-07 DIAGNOSIS — E1139 Type 2 diabetes mellitus with other diabetic ophthalmic complication: Secondary | ICD-10-CM | POA: Diagnosis not present

## 2021-10-07 DIAGNOSIS — I5042 Chronic combined systolic (congestive) and diastolic (congestive) heart failure: Secondary | ICD-10-CM | POA: Diagnosis not present

## 2021-10-08 ENCOUNTER — Ambulatory Visit: Payer: Medicare Other

## 2021-10-08 ENCOUNTER — Other Ambulatory Visit: Payer: Self-pay

## 2021-10-14 ENCOUNTER — Other Ambulatory Visit: Payer: Self-pay | Admitting: Family Medicine

## 2021-10-14 DIAGNOSIS — E1122 Type 2 diabetes mellitus with diabetic chronic kidney disease: Secondary | ICD-10-CM | POA: Diagnosis not present

## 2021-10-14 DIAGNOSIS — I130 Hypertensive heart and chronic kidney disease with heart failure and stage 1 through stage 4 chronic kidney disease, or unspecified chronic kidney disease: Secondary | ICD-10-CM | POA: Diagnosis not present

## 2021-10-14 DIAGNOSIS — D631 Anemia in chronic kidney disease: Secondary | ICD-10-CM | POA: Diagnosis not present

## 2021-10-14 DIAGNOSIS — E1139 Type 2 diabetes mellitus with other diabetic ophthalmic complication: Secondary | ICD-10-CM | POA: Diagnosis not present

## 2021-10-14 DIAGNOSIS — I5042 Chronic combined systolic (congestive) and diastolic (congestive) heart failure: Secondary | ICD-10-CM | POA: Diagnosis not present

## 2021-10-14 DIAGNOSIS — N183 Chronic kidney disease, stage 3 unspecified: Secondary | ICD-10-CM | POA: Diagnosis not present

## 2021-10-16 DIAGNOSIS — I130 Hypertensive heart and chronic kidney disease with heart failure and stage 1 through stage 4 chronic kidney disease, or unspecified chronic kidney disease: Secondary | ICD-10-CM | POA: Diagnosis not present

## 2021-10-16 DIAGNOSIS — E1139 Type 2 diabetes mellitus with other diabetic ophthalmic complication: Secondary | ICD-10-CM | POA: Diagnosis not present

## 2021-10-16 DIAGNOSIS — E785 Hyperlipidemia, unspecified: Secondary | ICD-10-CM | POA: Diagnosis not present

## 2021-10-16 DIAGNOSIS — I2510 Atherosclerotic heart disease of native coronary artery without angina pectoris: Secondary | ICD-10-CM | POA: Diagnosis not present

## 2021-10-16 DIAGNOSIS — D631 Anemia in chronic kidney disease: Secondary | ICD-10-CM | POA: Diagnosis not present

## 2021-10-16 DIAGNOSIS — M1A09X0 Idiopathic chronic gout, multiple sites, without tophus (tophi): Secondary | ICD-10-CM | POA: Diagnosis not present

## 2021-10-16 DIAGNOSIS — Z7984 Long term (current) use of oral hypoglycemic drugs: Secondary | ICD-10-CM | POA: Diagnosis not present

## 2021-10-16 DIAGNOSIS — E1122 Type 2 diabetes mellitus with diabetic chronic kidney disease: Secondary | ICD-10-CM | POA: Diagnosis not present

## 2021-10-16 DIAGNOSIS — K219 Gastro-esophageal reflux disease without esophagitis: Secondary | ICD-10-CM | POA: Diagnosis not present

## 2021-10-16 DIAGNOSIS — I252 Old myocardial infarction: Secondary | ICD-10-CM | POA: Diagnosis not present

## 2021-10-16 DIAGNOSIS — I5042 Chronic combined systolic (congestive) and diastolic (congestive) heart failure: Secondary | ICD-10-CM | POA: Diagnosis not present

## 2021-10-16 DIAGNOSIS — N183 Chronic kidney disease, stage 3 unspecified: Secondary | ICD-10-CM | POA: Diagnosis not present

## 2021-10-16 DIAGNOSIS — M150 Primary generalized (osteo)arthritis: Secondary | ICD-10-CM | POA: Diagnosis not present

## 2021-10-22 DIAGNOSIS — E1122 Type 2 diabetes mellitus with diabetic chronic kidney disease: Secondary | ICD-10-CM | POA: Diagnosis not present

## 2021-10-22 DIAGNOSIS — I130 Hypertensive heart and chronic kidney disease with heart failure and stage 1 through stage 4 chronic kidney disease, or unspecified chronic kidney disease: Secondary | ICD-10-CM | POA: Diagnosis not present

## 2021-10-22 DIAGNOSIS — D631 Anemia in chronic kidney disease: Secondary | ICD-10-CM | POA: Diagnosis not present

## 2021-10-22 DIAGNOSIS — N183 Chronic kidney disease, stage 3 unspecified: Secondary | ICD-10-CM | POA: Diagnosis not present

## 2021-10-22 DIAGNOSIS — E1139 Type 2 diabetes mellitus with other diabetic ophthalmic complication: Secondary | ICD-10-CM | POA: Diagnosis not present

## 2021-10-22 DIAGNOSIS — I5042 Chronic combined systolic (congestive) and diastolic (congestive) heart failure: Secondary | ICD-10-CM | POA: Diagnosis not present

## 2021-10-29 DIAGNOSIS — I5042 Chronic combined systolic (congestive) and diastolic (congestive) heart failure: Secondary | ICD-10-CM | POA: Diagnosis not present

## 2021-10-29 DIAGNOSIS — I130 Hypertensive heart and chronic kidney disease with heart failure and stage 1 through stage 4 chronic kidney disease, or unspecified chronic kidney disease: Secondary | ICD-10-CM | POA: Diagnosis not present

## 2021-10-29 DIAGNOSIS — E1122 Type 2 diabetes mellitus with diabetic chronic kidney disease: Secondary | ICD-10-CM | POA: Diagnosis not present

## 2021-10-29 DIAGNOSIS — E1139 Type 2 diabetes mellitus with other diabetic ophthalmic complication: Secondary | ICD-10-CM | POA: Diagnosis not present

## 2021-10-29 DIAGNOSIS — D631 Anemia in chronic kidney disease: Secondary | ICD-10-CM | POA: Diagnosis not present

## 2021-10-29 DIAGNOSIS — N183 Chronic kidney disease, stage 3 unspecified: Secondary | ICD-10-CM | POA: Diagnosis not present

## 2021-11-08 DIAGNOSIS — E1122 Type 2 diabetes mellitus with diabetic chronic kidney disease: Secondary | ICD-10-CM | POA: Diagnosis not present

## 2021-11-08 DIAGNOSIS — E1139 Type 2 diabetes mellitus with other diabetic ophthalmic complication: Secondary | ICD-10-CM | POA: Diagnosis not present

## 2021-11-08 DIAGNOSIS — N183 Chronic kidney disease, stage 3 unspecified: Secondary | ICD-10-CM | POA: Diagnosis not present

## 2021-11-08 DIAGNOSIS — D631 Anemia in chronic kidney disease: Secondary | ICD-10-CM | POA: Diagnosis not present

## 2021-11-08 DIAGNOSIS — I5042 Chronic combined systolic (congestive) and diastolic (congestive) heart failure: Secondary | ICD-10-CM | POA: Diagnosis not present

## 2021-11-08 DIAGNOSIS — I130 Hypertensive heart and chronic kidney disease with heart failure and stage 1 through stage 4 chronic kidney disease, or unspecified chronic kidney disease: Secondary | ICD-10-CM | POA: Diagnosis not present

## 2021-11-27 ENCOUNTER — Other Ambulatory Visit: Payer: Self-pay | Admitting: Family Medicine

## 2021-11-27 DIAGNOSIS — G5603 Carpal tunnel syndrome, bilateral upper limbs: Secondary | ICD-10-CM

## 2022-01-25 DIAGNOSIS — Z20822 Contact with and (suspected) exposure to covid-19: Secondary | ICD-10-CM | POA: Diagnosis not present

## 2022-01-29 ENCOUNTER — Other Ambulatory Visit: Payer: Self-pay | Admitting: Family Medicine

## 2022-01-29 DIAGNOSIS — K219 Gastro-esophageal reflux disease without esophagitis: Secondary | ICD-10-CM

## 2022-02-06 ENCOUNTER — Other Ambulatory Visit: Payer: Self-pay

## 2022-02-06 ENCOUNTER — Emergency Department (HOSPITAL_BASED_OUTPATIENT_CLINIC_OR_DEPARTMENT_OTHER)
Admission: EM | Admit: 2022-02-06 | Discharge: 2022-02-06 | Disposition: A | Discharge status: Home / Self Care | Payer: Medicare Other | Attending: Emergency Medicine | Admitting: Emergency Medicine

## 2022-02-06 ENCOUNTER — Emergency Department (HOSPITAL_BASED_OUTPATIENT_CLINIC_OR_DEPARTMENT_OTHER): Payer: Medicare Other

## 2022-02-06 ENCOUNTER — Encounter (HOSPITAL_BASED_OUTPATIENT_CLINIC_OR_DEPARTMENT_OTHER): Payer: Self-pay | Admitting: Emergency Medicine

## 2022-02-06 ENCOUNTER — Ambulatory Visit: Payer: Medicare Other | Admitting: Cardiology

## 2022-02-06 DIAGNOSIS — Z23 Encounter for immunization: Secondary | ICD-10-CM | POA: Insufficient documentation

## 2022-02-06 DIAGNOSIS — S0181XA Laceration without foreign body of other part of head, initial encounter: Secondary | ICD-10-CM | POA: Insufficient documentation

## 2022-02-06 DIAGNOSIS — I77810 Thoracic aortic ectasia: Secondary | ICD-10-CM | POA: Diagnosis not present

## 2022-02-06 DIAGNOSIS — J984 Other disorders of lung: Secondary | ICD-10-CM | POA: Diagnosis not present

## 2022-02-06 DIAGNOSIS — W01198A Fall on same level from slipping, tripping and stumbling with subsequent striking against other object, initial encounter: Secondary | ICD-10-CM | POA: Insufficient documentation

## 2022-02-06 DIAGNOSIS — Z7901 Long term (current) use of anticoagulants: Secondary | ICD-10-CM | POA: Diagnosis not present

## 2022-02-06 DIAGNOSIS — M7989 Other specified soft tissue disorders: Secondary | ICD-10-CM | POA: Diagnosis not present

## 2022-02-06 DIAGNOSIS — S0990XA Unspecified injury of head, initial encounter: Secondary | ICD-10-CM

## 2022-02-06 DIAGNOSIS — E1122 Type 2 diabetes mellitus with diabetic chronic kidney disease: Secondary | ICD-10-CM | POA: Insufficient documentation

## 2022-02-06 DIAGNOSIS — Z87442 Personal history of urinary calculi: Secondary | ICD-10-CM | POA: Diagnosis not present

## 2022-02-06 DIAGNOSIS — Z7984 Long term (current) use of oral hypoglycemic drugs: Secondary | ICD-10-CM | POA: Insufficient documentation

## 2022-02-06 DIAGNOSIS — I5032 Chronic diastolic (congestive) heart failure: Secondary | ICD-10-CM | POA: Insufficient documentation

## 2022-02-06 DIAGNOSIS — N183 Chronic kidney disease, stage 3 unspecified: Secondary | ICD-10-CM | POA: Insufficient documentation

## 2022-02-06 DIAGNOSIS — W19XXXA Unspecified fall, initial encounter: Secondary | ICD-10-CM

## 2022-02-06 DIAGNOSIS — M25422 Effusion, left elbow: Secondary | ICD-10-CM

## 2022-02-06 DIAGNOSIS — S0991XA Unspecified injury of ear, initial encounter: Secondary | ICD-10-CM | POA: Insufficient documentation

## 2022-02-06 DIAGNOSIS — I2510 Atherosclerotic heart disease of native coronary artery without angina pectoris: Secondary | ICD-10-CM | POA: Insufficient documentation

## 2022-02-06 DIAGNOSIS — Z79899 Other long term (current) drug therapy: Secondary | ICD-10-CM | POA: Diagnosis not present

## 2022-02-06 DIAGNOSIS — Z043 Encounter for examination and observation following other accident: Secondary | ICD-10-CM | POA: Diagnosis not present

## 2022-02-06 DIAGNOSIS — S299XXA Unspecified injury of thorax, initial encounter: Secondary | ICD-10-CM | POA: Diagnosis not present

## 2022-02-06 DIAGNOSIS — M47816 Spondylosis without myelopathy or radiculopathy, lumbar region: Secondary | ICD-10-CM | POA: Diagnosis not present

## 2022-02-06 DIAGNOSIS — I130 Hypertensive heart and chronic kidney disease with heart failure and stage 1 through stage 4 chronic kidney disease, or unspecified chronic kidney disease: Secondary | ICD-10-CM | POA: Insufficient documentation

## 2022-02-06 MED ORDER — TETANUS-DIPHTH-ACELL PERTUSSIS 5-2.5-18.5 LF-MCG/0.5 IM SUSY
0.5000 mL | PREFILLED_SYRINGE | Freq: Once | INTRAMUSCULAR | Status: AC
  Administered 2022-02-06: 0.5 mL via INTRAMUSCULAR
  Filled 2022-02-06: qty 0.5

## 2022-02-06 MED ORDER — BACITRACIN ZINC 500 UNIT/GM EX OINT: TOPICAL_OINTMENT | CUTANEOUS | Status: AC

## 2022-02-06 MED ORDER — BACITRACIN ZINC 500 UNIT/GM EX OINT
TOPICAL_OINTMENT | CUTANEOUS | Status: AC
  Administered 2022-02-06: 1 via TOPICAL
  Filled 2022-02-06: qty 28.35

## 2022-02-06 NOTE — ED Triage Notes (Signed)
Pt reports bending over to pick something up off the floor, lost her balance, and hit her chin on her walker. Pt has 0.5 inch lac to her chin. Bleeding controlled.  ?

## 2022-02-06 NOTE — ED Provider Notes (Signed)
?[facility] EMERGENCY DEPARTMENT ?Provider Note ? ? ?CSN: [id number] ?Arrival date & time: 02/06/22  [id number] ? ?  ? ?History ? ?Chief Complaint  ?Patient presents with  ? Fall  ? ? ?Margaret Ward is a 81 y.o. female. ? ?This is a 81 y.o. female  with significant medical history as below, including hyperlipidemia hypertension, migraine who presents to the ED with complaint of fall.  ? ?Patient reports she was leaning over to pick up something on the floor, she lost her balance and fell forward, struck her face on her walker.  No LOC.  Takes Plavix, no thinners.  She was helped up by spouse.  Patient uses walker at baseline for ambulation.  No prodromal symptoms, no seizure-like activity, no nausea or vomiting, no chest pain or dyspnea.   ? ?Last tetanus shot was approximately 10 years ago. ? ? ? ? ?Past Medical History: ?No date: Anemia ?No date: Arthritis ?    Comment:  "hands, arms, all over the place" (03/02/2018) ?10/11/2019: Bilateral carpal tunnel syndrome ?03/28/2018: Bronchospasm ?02/08/2018: CAD (coronary artery disease) ?    Comment:  Left heart cath 01/13/18: 40% distal LMCAS 90% proximal  ?             LAD  LCF with 90% proximal ramus stenosis, diffuse RCA,  ?             60% distal and 90% proximal PDA stenosis ?No date: CAD in native artery ?    Comment:  a. s/p CABG in 01/2018. b. Recurrent CP 02/2018 -> cath  ?             showing early graft closure SVG-diagonal, received DES to ?             D1. ?05/21/2018: CAD/s/p CABG Mar 2019 w/ DES to vein graft Apr 2019 ?05/27/2018: Lysbeth Galas ulcer, acute ?12/28/2018: Carpal tunnel syndrome, bilateral ?02/05/2018: Chest pain ?No date: Chronic combined systolic and diastolic CHF (congestive  ?heart failure) ([place]) ?    Comment:  a. LVEF 40-45% by echo 02/2018. 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    Comment:  right eye ?No date: Gout ?    Comment:  "on daily RX" (03/02/2018) ?05/21/2018: Heme positive stool ?No date: History of kidney stones ?05/21/2018: HLD (hyperlipidemia) ?No date: Hyperlipidemia ?No date: Hypertension ?No date: Hypertensive heart disease ?03/03/2018: Hypertensive heart disease with heart failure ([place]) ?No date: Kidney cysts ?No date: Kidney disease ?No date: Left ventricular aneurysm ?No date: Migraine ?    Comment:  "used to get them twice/week; haven't had them in a long ?             time" (03/02/2018) ?03/01/2018: NSTEMI (non-ST elevated myocardial infarction) ([place]) ?No date: On home oxygen therapy ?    Comment:  "3L at night and prn during the day" (03/02/2018) ?No date: Pneumonia ?    Comment:  used to get it twice/year; stopped after I had  ?             hysterectomy" (03/02/2018) ?02/04/2018: S/P CABG x 3 ?03/09/2018: Sinus tachycardia ?No date: Sleep apnea ?03/09/2018: SOB (shortness of breath) ?05/21/2018: Symptomatic anemia ?No date:  Type II diabetes mellitus ([place]) ? ?Past Surgical History: ?No date: BACK SURGERY ?05/22/2018: BIOPSY ?    Comment:  Procedure: BIOPSY;  Surgeon: Otis Brace, MD;   ?             Location: [facility];  Service: Gastroenterology;; ?12/2017: CARDIAC CATHETERIZATION ?    Comment:  "before [facility]" ?1982: CHOLECYSTECTOMY OPEN ?01/19/2018: CORONARY ARTERY BYPASS GRAFT ?    Comment:  "CABG X3; in [place]" ?03/02/2018: CORONARY STENT INTERVENTION; N/A ?    Comment:  Procedure: CORONARY STENT INTERVENTION;  Surgeon:  ?             Burnell Blanks, MD;  Location: [facility] INVASIVE CV  ?             LAB;  Service: Cardiovascular;  Laterality: N/A; ?No date: DILATION AND CURETTAGE OF UTERUS ?05/22/2018: ESOPHAGOGASTRODUODENOSCOPY (EGD) WITH  PROPOFOL; N/A ?    Comment:  Procedure: ESOPHAGOGASTRODUODENOSCOPY (EGD) WITH  ?             PROPOFOL;  Surgeon: Otis Brace, MD;  Location: [facility]  ?             ENDOSCOPY;  Service: Gastroenterology;  Laterality: N/A; ?05/24/2018: ESOPHAGOGASTRODUODENOSCOPY (EGD) WITH PROPOFOL; N/A ?    Comment:  Procedure: ESOPHAGOGASTRODUODENOSCOPY (EGD) WITH  ?             PROPOFOL;  Surgeon: Otis Brace, MD;  Location: [facility]  ?             ENDOSCOPY;  Service: Gastroenterology;  Laterality: N/A; ?No date: EYE SURGERY; Right ?    Comment:  "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" ?X 4: KNEE ARTHROSCOPY; Right ?03/02/2018: LEFT HEART CATH AND CORS/GRAFTS ANGIOGRAPHY; N/A ?    Comment:  Procedure: LEFT HEART CATH AND CORS/GRAFTS ANGIOGRAPHY;  ?             Surgeon: Burnell Blanks, MD;  Location: [facility]  ?             INVASIVE CV LAB;  Service: Cardiovascular;  Laterality:  ?             N/A; ?06/13/2020: LEFT HEART CATH AND CORS/GRAFTS ANGIOGRAPHY; N/A ?    Comment:  Procedure: LEFT HEART CATH AND CORS/GRAFTS ANGIOGRAPHY;  ?             Surgeon: Burnell Blanks, MD;  Location: [facility]  ?             INVASIVE CV LAB;  Service: Cardiovascular;  Laterality:  ?             N/A; ?No date: LUMBAR DISC SURGERY ?X 2: REDUCTION MAMMAPLASTY; Bilateral ?05/24/2018: SCHLEROTHERAPY ?    Comment:  Procedure: SCHLEROTHERAPY;  Surgeon: Otis Brace,  ?             MD;  Location: [place] [facility];  Service: Gastroenterology;; ?60: TONSILLECTOMY ?1970: VAGINAL HYSTERECTOMY  ? ? ?The history is provided by the patient and a relative. No language interpreter was used.  ?Fall ?Pertinent negatives include no chest pain, no abdominal pain, no headaches and no shortness of breath.  ? ?  ? ?Home Medications ?Prior to Admission medications   ?Medication Sig Start Date End Date Taking? Authorizing Provider  ?clopidogrel (PLAVIX) 75 MG tablet TAKE 1 TABLET(75 MG) BY MOUTH DAILY 01/29/22   Shelda Pal, DO  ?pantoprazole (PROTONIX) 20 MG tablet TAKE 1  TABLET(20 MG) BY MOUTH DAILY 01/29/22   Shelda Pal, DO  ?acetaminophen (  TYLENOL) 500 MG tablet Take 1,000 mg by mouth every 6 (six) hours as needed for headache.     [provider]  ?allopurinol (ZYLOPRIM) 100 MG tablet TAKE 1 TABLET(100 MG) BY MOUTH DAILY 09/25/21   Nani Ravens, Crosby Oyster, DO  ?atorvastatin (LIPITOR) 40 MG tablet TAKE 1 TABLET(40 MG) BY MOUTH AT BEDTIME 05/08/21   Tobb, Kardie, DO  ?carvedilol (COREG) 12.5 MG tablet TAKE 1 TABLET(12.5 MG) BY MOUTH TWICE DAILY 04/03/21   Tobb, Godfrey Pick, DO  ?DULoxetine (CYMBALTA) 20 MG capsule TAKE 1 CAPSULE(20 MG) BY MOUTH DAILY 11/27/21   Wendling, Crosby Oyster, DO  ?furosemide (LASIX) 40 MG tablet TAKE 1 TABLET BY MOUTH EVERY DAY, TAKE ADDITIONAL TABLET ON MONDAY, WEDNESDAY, AND FRIDAY 07/03/21   Tobb, Kardie, DO  ?isosorbide mononitrate (IMDUR) 30 MG 24 hr tablet TAKE 1 TABLET(30 MG) BY MOUTH DAILY 07/03/21   Tobb, Kardie, DO  ?metFORMIN (GLUCOPHAGE-XR) 500 MG 24 hr tablet Take 2 tablets (1,000 mg total) by mouth daily with breakfast. 10/14/21   Shelda Pal, DO  ?Multiple Vitamin (MULTIVITAMIN WITH MINERALS) TABS tablet Take 1 tablet by mouth daily.    [provider]  ?nitroGLYCERIN (NITROSTAT) 0.4 MG SL tablet Place 1 tablet (0.4 mg total) under the tongue every 5 (five) minutes x 3 doses as needed for chest pain. 03/03/18   Ledora Bottcher, PA  ?Pseudoeph-Doxylamine-DM-APAP (NYQUIL PO) Take 1 Dose by mouth at bedtime as needed (congestion).     [provider]  ?sacubitril-valsartan (ENTRESTO) 49-51 MG Take 1 tablet by mouth 2 (two) times daily. 08/29/21   Tobb, Kardie, DO  ?spironolactone (ALDACTONE) 25 MG tablet TAKE 1/2 TABLET(12.5 MG) BY MOUTH DAILY 07/03/21   Tobb, Kardie, DO  ?vitamin C (ASCORBIC ACID) 500 MG tablet Take 500 mg by mouth 2 (two) times daily.    [provider]  ?   ? ?Allergies    ?Methylprednisolone, Keflex [cephalexin], Ketorolac, Morphine and related, Novocain [procaine],  Ramipril, and Septra [sulfamethoxazole-trimethoprim]   ? ?Review of Systems   ?Review of Systems  ?Constitutional:  Negative for chills and fever.  ?HENT:  Negative for facial swelling and trouble swallowing.   ?Eyes:

## 2022-02-06 NOTE — Discharge Instructions (Addendum)
It was a pleasure caring for you today in the emergency department. ?F/u with pcp for rpt XR in 2 wks of left elbow ?F/u for suture removal in 5 days ?Please return to the emergency department for any worsening or worrisome symptoms. ? ?

## 2022-02-06 NOTE — ED Notes (Signed)
Pt ambulatory to waiting room. Pt verbalized understanding of discharge instructions.   

## 2022-02-07 ENCOUNTER — Other Ambulatory Visit: Payer: Self-pay

## 2022-02-07 DIAGNOSIS — E785 Hyperlipidemia, unspecified: Secondary | ICD-10-CM

## 2022-02-07 MED ORDER — ATORVASTATIN CALCIUM 40 MG PO TABS: ORAL_TABLET | ORAL | 2 refills | Status: DC

## 2022-02-11 ENCOUNTER — Other Ambulatory Visit: Payer: Self-pay

## 2022-02-11 DIAGNOSIS — E785 Hyperlipidemia, unspecified: Secondary | ICD-10-CM

## 2022-02-11 MED ORDER — ATORVASTATIN CALCIUM 40 MG PO TABS: ORAL_TABLET | ORAL | 2 refills | Status: DC

## 2022-02-12 DIAGNOSIS — Z1152 Encounter for screening for COVID-19: Secondary | ICD-10-CM | POA: Diagnosis not present

## 2022-02-12 DIAGNOSIS — Z20828 Contact with and (suspected) exposure to other viral communicable diseases: Secondary | ICD-10-CM | POA: Diagnosis not present

## 2022-02-28 ENCOUNTER — Encounter: Payer: Self-pay | Admitting: Cardiology

## 2022-02-28 ENCOUNTER — Ambulatory Visit (INDEPENDENT_AMBULATORY_CARE_PROVIDER_SITE_OTHER): Payer: Medicare Other | Admitting: Cardiology

## 2022-02-28 VITALS — BP 98/62 | HR 88 | Ht 62.0 in | Wt 154.1 lb

## 2022-02-28 DIAGNOSIS — E669 Obesity, unspecified: Secondary | ICD-10-CM | POA: Diagnosis not present

## 2022-02-28 DIAGNOSIS — Z951 Presence of aortocoronary bypass graft: Secondary | ICD-10-CM | POA: Diagnosis not present

## 2022-02-28 DIAGNOSIS — I2510 Atherosclerotic heart disease of native coronary artery without angina pectoris: Secondary | ICD-10-CM | POA: Diagnosis not present

## 2022-02-28 DIAGNOSIS — I519 Heart disease, unspecified: Secondary | ICD-10-CM | POA: Diagnosis not present

## 2022-02-28 DIAGNOSIS — I5042 Chronic combined systolic (congestive) and diastolic (congestive) heart failure: Secondary | ICD-10-CM | POA: Diagnosis not present

## 2022-02-28 DIAGNOSIS — E1169 Type 2 diabetes mellitus with other specified complication: Secondary | ICD-10-CM

## 2022-02-28 NOTE — Progress Notes (Signed)
?Cardiology Office Note:   ? ?Date:  02/28/2022  ? ?ID:  Margaret Ward, DOB Apr 14, 1941, MRN [id number] ? ?PCP:  Shelda Pal, DO  ?Cardiologist:  Jenean Lindau, MD  ? ?Referring MD: Shelda Pal*  ? ? ?ASSESSMENT:   ? ?1. Coronary artery disease involving native coronary artery of native heart without angina pectoris   ?2. CAD/s/p CABG Mar 2019 w/ DES to vein graft Apr 2019   ?3. Chronic combined systolic and diastolic heart failure ([place])   ?4. S/P CABG x 3   ?5. Diabetes mellitus type 2 in obese [facility])   ? ?PLAN:   ? ?In order of problems listed above: ? ?Coronary artery disease: Secondary prevention stressed with the patient.  Importance of compliance with diet medication stressed and she vocalized understanding. ?Ischemic cardiomyopathy: On guideline directed medical therapy.  We will have an echocardiogram to assess left ventricular systolic function.  Also she will have blood work because she is on multiple medications.  She will have a Chem-7 amongst other blood works. ?Essential hypertension: Blood pressure stable and diet was emphasized.  Blood pressure is borderline and I am concerned about this and will discontinue isosorbide.  I told the daughter that if she has any recurrent issues with angina she can restart this medications and give [place] a call and she understands. ?Mixed dyslipidemia: Diet was emphasized.  Lifestyle modification urged.  Lipids followed by primary care. ?Patient will be seen in follow-up appointment in 6 months or earlier if the patient has any concerns ? ? ? ?Medication Adjustments/Labs and Tests Ordered: ?Current medicines are reviewed at length with the patient today.  Concerns regarding medicines are outlined above.  ?No orders of the defined types were placed in this encounter. ? ?No orders of the defined types were placed in this encounter. ? ? ? ?No chief complaint on file. ?  ? ?History of Present Illness:   ? ?Margaret Ward is a 81 y.o. female.   Patient has past medical history of coronary artery disease post CABG and ischemic cardiomyopathy.  She has history of essential hypertension dyslipidemia.  She denies any problems at this time and takes care of activities of daily living.  She ambulates with a walker and had a mechanical fall and recovering from it.  She has had significant left shoulder impact injury and is following up primary care for this.  At the time of my evaluation, the patient is alert awake oriented and in no distress.  The patient is previously unknown to me and was seen by Dr. Jorene Minors and followed by me as Dr. Jorene Minors has moved to another location. ? ?Past Medical History:  ?Diagnosis Date  ? Anemia   ? Anemia due to chronic blood loss 02/05/2021  ? Arthritis   ? "hands, arms, all over the place" (03/02/2018)  ? Bilateral carpal tunnel syndrome 10/11/2019  ? Bronchospasm 03/28/2018  ? CAD (coronary artery disease) 02/08/2018  ? Left heart cath 01/13/18: 40% distal LMCAS 90% proximal LAD  LCF with 90% proximal ramus stenosis, diffuse RCA, 60% distal and 90% proximal PDA stenosis  ? CAD in native artery   ? a. s/p CABG in 01/2018. b. Recurrent CP 02/2018 -> cath showing early graft closure SVG-diagonal, received DES to D1.  ? CAD/s/p CABG Mar 2019 w/ DES to vein graft Apr 2019 05/21/2018  ? Cameron ulcer, acute 05/27/2018  ? Carpal tunnel syndrome, bilateral 12/28/2018  ? Chest pain 02/05/2018  ? Chronic combined systolic  and diastolic CHF (congestive heart failure) ([place])   ? a. LVEF 40-45% by echo 02/2018.  ? Chronic combined systolic and diastolic heart failure ([place]) 05/21/2018  ? Chronic diastolic heart failure ([place]) 03/28/2018  ? 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  ? Chronic hypoxemic respiratory failure ([place]) 05/21/2018  ? Chronic lower back pain   ? CKD (chronic kidney disease), stage III ([place])   ? Diabetes mellitus type 2 in obese ([place]) 04/23/2018  ? Falls frequently 09/04/2021  ? Gastric ulcer, unspecified as acute or chronic, without hemorrhage or  perforation 05/27/2018  ? GERD (gastroesophageal reflux disease)   ? Glass eye   ? right eye  ? Gout   ? "on daily RX" (03/02/2018)  ? Heme positive stool 05/21/2018  ? History of anemia 02/05/2021  ? History of cholecystectomy 02/05/2021  ? History of kidney stones   ? HLD (hyperlipidemia) 05/21/2018  ? Hyperlipidemia   ? Hypertension   ? Hypertensive heart disease   ? Hypertensive heart disease with heart failure ([place]) 03/03/2018  ? Kidney cysts   ? Kidney disease   ? Left ventricular aneurysm   ? Migraine   ? "used to get them twice/week; haven't had them in a long time" (03/02/2018)  ? NSTEMI (non-ST elevated myocardial infarction) ([place]) 03/01/2018  ? Obesity (BMI 30-39.9) 07/25/2020  ? On home oxygen therapy   ? "3L at night and prn during the day" (03/02/2018)  ? Physical deconditioning 10/24/2020  ? Pneumonia   ? used to get it twice/year; stopped after I had hysterectomy" (03/02/2018)  ? 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  ? Senile purpura ([place]) 09/04/2021  ? Sinus tachycardia 03/09/2018  ? Sleep apnea   ? SOB (shortness of breath) 03/09/2018  ? Symptomatic anemia 05/21/2018  ? Type II diabetes mellitus ([place])   ? ? ?Past Surgical History:  ?Procedure Laterality Date  ? BACK SURGERY    ? BIOPSY  05/22/2018  ? Procedure: BIOPSY;  Surgeon: Otis Brace, MD;  Location: [place] [facility];  Service: Gastroenterology;;  ? CARDIAC CATHETERIZATION  12/2017  ? "before [facility]"  ? CHOLECYSTECTOMY OPEN  1982  ? CORONARY ARTERY BYPASS GRAFT  01/19/2018  ? "CABG X3; in [place]"  ? CORONARY STENT INTERVENTION N/A 03/02/2018  ? Procedure: CORONARY STENT INTERVENTION;  Surgeon: Burnell Blanks, MD;  Location: [place] [facility];  Service: Cardiovascular;  Laterality: N/A;  ? DILATION AND CURETTAGE OF UTERUS    ? ESOPHAGOGASTRODUODENOSCOPY (EGD) WITH PROPOFOL N/A 05/22/2018  ? Procedure: ESOPHAGOGASTRODUODENOSCOPY (EGD) WITH PROPOFOL;  Surgeon: Otis Brace, MD;  Location: [facility];  Service: Gastroenterology;  Laterality: N/A;  ?  ESOPHAGOGASTRODUODENOSCOPY (EGD) WITH PROPOFOL N/A 05/24/2018  ? Procedure: ESOPHAGOGASTRODUODENOSCOPY (EGD) WITH PROPOFOL;  Surgeon: Otis Brace, MD;  Location: [facility];  Service: Gastroenterology;  Laterality: N/A;  ? EYE SURGERY Right   ? "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"  ? KNEE ARTHROSCOPY Right X 4  ? LEFT HEART CATH AND CORS/GRAFTS ANGIOGRAPHY N/A 03/02/2018  ? Procedure: LEFT HEART CATH AND CORS/GRAFTS ANGIOGRAPHY;  Surgeon: Burnell Blanks, MD;  Location: [place] [facility];  Service: Cardiovascular;  Laterality: N/A;  ? LEFT HEART CATH AND CORS/GRAFTS ANGIOGRAPHY N/A 06/13/2020  ? Procedure: LEFT HEART CATH AND CORS/GRAFTS ANGIOGRAPHY;  Surgeon: Burnell Blanks, MD;  Location: [place] [facility];  Service: Cardiovascular;  Laterality: N/A;  ? LUMBAR DISC SURGERY    ? REDUCTION MAMMAPLASTY Bilateral X 2  ? SCHLEROTHERAPY  05/24/2018  ? Procedure: SCHLEROTHERAPY;  Surgeon: Otis Brace, MD;  Location: [facility];  Service: Gastroenterology;;  ? TONSILLECTOMY  1953  ? VAGINAL HYSTERECTOMY  1970  ? ? ?Current Medications: ?Current Meds  ?Medication Sig  ? acetaminophen (TYLENOL) 500 MG tablet Take 1,000 mg by mouth every 6 (six) hours as needed for headache.   ? allopurinol (ZYLOPRIM) 100 MG tablet TAKE 1 TABLET(100 MG) BY MOUTH DAILY  ? atorvastatin (LIPITOR) 40 MG tablet TAKE 1 TABLET(40 MG) BY MOUTH AT BEDTIME  ? carvedilol (COREG) 12.5 MG tablet TAKE 1 TABLET(12.5 MG) BY MOUTH TWICE DAILY  ? clopidogrel (PLAVIX) 75 MG tablet TAKE 1 TABLET(75 MG) BY MOUTH DAILY  ? DULoxetine (CYMBALTA) 20 MG capsule TAKE 1 CAPSULE(20 MG) BY MOUTH DAILY  ? furosemide (LASIX) 40 MG tablet TAKE 1 TABLET BY MOUTH EVERY DAY, TAKE ADDITIONAL TABLET ON MONDAY, WEDNESDAY, AND FRIDAY  ? metFORMIN (GLUCOPHAGE-XR) 500 MG 24 hr tablet Take 2 tablets (1,000 mg total) by mouth daily with breakfast.  ? Multiple Vitamin (MULTIVITAMIN WITH MINERALS) TABS tablet Take 1 tablet by mouth daily.  ? nitroGLYCERIN (NITROSTAT) 0.4  MG SL tablet Place 1 tablet (0.4 mg total) under the tongue every 5 (five) minutes x 3 doses as needed for chest pain.  ? pantoprazole (PROTONIX) 20 MG tablet TAKE 1 TABLET(20 MG) BY MOUTH DAILY  ? Pseudoeph-Doxyla

## 2022-02-28 NOTE — Patient Instructions (Signed)
Medication Instructions:  ?Your physician has recommended you make the following change in your medication:  ? ?Stop Imdur (Isosorbide) ? ?*If you need a refill on your cardiac medications before your next appointment, please call your pharmacy* ? ? ?Lab Work: ?Your physician recommends that you have a BMET done today. ? ?If you have labs (blood work) drawn today and your tests are completely normal, you will receive your results only by: ?MyChart Message (if you have MyChart) OR ?A paper copy in the mail ?If you have any lab test that is abnormal or we need to change your treatment, we will call you to review the results. ? ? ?Testing/Procedures: ?Your physician has requested that you have an echocardiogram. Echocardiography is a painless test that uses sound waves to create images of your heart. It provides your doctor with information about the size and shape of your heart and how well your heart?s chambers and valves are working. This procedure takes approximately one hour. There are no restrictions for this procedure. ? ? ? ?Follow-Up: ?At [facility], you and your health needs are our priority.  As part of our continuing mission to provide you with exceptional heart care, we have created designated Provider Care Teams.  These Care Teams include your primary Cardiologist (physician) and Advanced Practice Providers (APPs -  Physician Assistants and Nurse Practitioners) who all work together to provide you with the care you need, when you need it. ? ?We recommend signing up for the patient portal called "MyChart".  Sign up information is provided on this After Visit Summary.  MyChart is used to connect with patients for Virtual Visits (Telemedicine).  Patients are able to view lab/test results, encounter notes, upcoming appointments, etc.  Non-urgent messages can be sent to your provider as well.   ?To learn more about what you can do with MyChart, go to [email or web address].   ? ?Your next appointment:    ?6 month(s) ? ?The format for your next appointment:   ?In Person ? ?Provider:   ?Jyl Heinz, MD ? ? ?Other Instructions ?Echocardiogram ?An echocardiogram is a test that uses sound waves (ultrasound) to produce images of the heart. ?Images from an echocardiogram can provide important information about: ?Heart size and shape. ?The size and thickness and movement of your heart's walls. ?Heart muscle function and strength. ?Heart valve function or if you have stenosis. Stenosis is when the heart valves are too narrow. ?If blood is flowing backward through the heart valves (regurgitation). ?A tumor or infectious growth around the heart valves. ?Areas of heart muscle that are not working well because of poor blood flow or injury from a heart attack. ?Aneurysm detection. An aneurysm is a weak or damaged part of an artery wall. The wall bulges out from the normal force of blood pumping through the body. ?Tell a health care provider about: ?Any allergies you have. ?All medicines you are taking, including vitamins, herbs, eye drops, creams, and over-the-counter medicines. ?Any blood disorders you have. ?Any surgeries you have had. ?Any medical conditions you have. ?Whether you are pregnant or may be pregnant. ?What are the risks? ?Generally, this is a safe test. However, problems may occur, including an allergic reaction to dye (contrast) that may be used during the test. ?What happens before the test? ?No specific preparation is needed. You may eat and drink normally. ?What happens during the test? ?You will take off your clothes from the waist up and put on a hospital gown. ?Electrodes or electrocardiogram (ECG)patches may  be placed on your chest. The electrodes or patches are then connected to a device that monitors your heart rate and rhythm. ?You will lie down on a table for an ultrasound exam. A gel will be applied to your chest to help sound waves pass through your skin. ?A handheld device, called a transducer,  will be pressed against your chest and moved over your heart. The transducer produces sound waves that travel to your heart and bounce back (or "echo" back) to the transducer. These sound waves will be captured in real-time and changed into images of your heart that can be viewed on a video monitor. The images will be recorded on a computer and reviewed by your health care provider. ?You may be asked to change positions or hold your breath for a short time. This makes it easier to get different views or better views of your heart. ?In some cases, you may receive contrast through an IV in one of your veins. This can improve the quality of the pictures from your heart. ?The procedure may vary among health care providers and hospitals.   ?What can I expect after the test? ?You may return to your normal, everyday life, including diet, activities, and medicines, unless your health care provider tells you not to do that. ?Follow these instructions at home: ?It is up to you to get the results of your test. Ask your health care provider, or the department that is doing the test, when your results will be ready. ?Keep all follow-up visits. This is important. ?Summary ?An echocardiogram is a test that uses sound waves (ultrasound) to produce images of the heart. ?Images from an echocardiogram can provide important information about the size and shape of your heart, heart muscle function, heart valve function, and other possible heart problems. ?You do not need to do anything to prepare before this test. You may eat and drink normally. ?After the echocardiogram is completed, you may return to your normal, everyday life, unless your health care provider tells you not to do that. ?This information is not intended to replace advice given to you by your health care provider. Make sure you discuss any questions you have with your health care provider. ?Document Revised: 06/26/2020 Document Reviewed: 06/26/2020 ?Elsevier Patient  Education ? [place]. ? ? ?

## 2022-03-01 LAB — BASIC METABOLIC PANEL
BUN/Creatinine Ratio: 31 — ABNORMAL HIGH (ref 12–28)
BUN: 39 mg/dL — ABNORMAL HIGH (ref 8–27)
CO2: 18 mmol/L — ABNORMAL LOW (ref 20–29)
Calcium: 9.2 mg/dL (ref 8.7–10.3)
Chloride: 109 mmol/L — ABNORMAL HIGH (ref 96–106)
Creatinine, Ser: 1.27 mg/dL — ABNORMAL HIGH (ref 0.57–1.00)
Glucose: 162 mg/dL — ABNORMAL HIGH (ref 70–99)
Potassium: 4.6 mmol/L (ref 3.5–5.2)
Sodium: 142 mmol/L (ref 134–144)
eGFR: 42 mL/min/{1.73_m2} — ABNORMAL LOW (ref >59)

## 2022-03-06 DIAGNOSIS — Z20822 Contact with and (suspected) exposure to covid-19: Secondary | ICD-10-CM | POA: Diagnosis not present

## 2022-03-11 ENCOUNTER — Ambulatory Visit: Payer: Medicare Other | Admitting: Family Medicine

## 2022-03-15 DIAGNOSIS — Z20822 Contact with and (suspected) exposure to covid-19: Secondary | ICD-10-CM | POA: Diagnosis not present

## 2022-03-19 ENCOUNTER — Other Ambulatory Visit: Payer: Self-pay | Admitting: Cardiology

## 2022-03-21 ENCOUNTER — Ambulatory Visit (INDEPENDENT_AMBULATORY_CARE_PROVIDER_SITE_OTHER): Payer: Medicare Other | Admitting: Family Medicine

## 2022-03-21 ENCOUNTER — Other Ambulatory Visit (HOSPITAL_BASED_OUTPATIENT_CLINIC_OR_DEPARTMENT_OTHER): Payer: Medicare Other

## 2022-03-21 ENCOUNTER — Encounter: Payer: Self-pay | Admitting: Family Medicine

## 2022-03-21 VITALS — BP 120/76 | HR 83 | Temp 98.0°F | Ht 62.0 in | Wt 156.0 lb

## 2022-03-21 DIAGNOSIS — Z23 Encounter for immunization: Secondary | ICD-10-CM

## 2022-03-21 DIAGNOSIS — K219 Gastro-esophageal reflux disease without esophagitis: Secondary | ICD-10-CM

## 2022-03-21 DIAGNOSIS — D692 Other nonthrombocytopenic purpura: Secondary | ICD-10-CM

## 2022-03-21 DIAGNOSIS — E1139 Type 2 diabetes mellitus with other diabetic ophthalmic complication: Secondary | ICD-10-CM | POA: Diagnosis not present

## 2022-03-21 DIAGNOSIS — R296 Repeated falls: Secondary | ICD-10-CM | POA: Diagnosis not present

## 2022-03-21 DIAGNOSIS — I25118 Atherosclerotic heart disease of native coronary artery with other forms of angina pectoris: Secondary | ICD-10-CM | POA: Diagnosis not present

## 2022-03-21 DIAGNOSIS — Z20822 Contact with and (suspected) exposure to covid-19: Secondary | ICD-10-CM | POA: Diagnosis not present

## 2022-03-21 LAB — LIPID PANEL
Cholesterol: 94 mg/dL (ref 0–200)
HDL: 52.4 mg/dL (ref >39.00)
LDL Cholesterol: 22 mg/dL (ref 0–99)
NonHDL: 41.74
Total CHOL/HDL Ratio: 2
Triglycerides: 99 mg/dL (ref 0.0–149.0)
VLDL: 19.8 mg/dL (ref 0.0–40.0)

## 2022-03-21 LAB — COMPREHENSIVE METABOLIC PANEL
ALT: 14 U/L (ref 0–35)
AST: 16 U/L (ref 0–37)
Albumin: 4 g/dL (ref 3.5–5.2)
Alkaline Phosphatase: 73 U/L (ref 39–117)
BUN: 32 mg/dL — ABNORMAL HIGH (ref 6–23)
CO2: 24 mEq/L (ref 19–32)
Calcium: 9 mg/dL (ref 8.4–10.5)
Chloride: 108 mEq/L (ref 96–112)
Creatinine, Ser: 1.17 mg/dL (ref 0.40–1.20)
GFR: 43.81 mL/min — ABNORMAL LOW (ref >60.00)
Glucose, Bld: 154 mg/dL — ABNORMAL HIGH (ref 70–99)
Potassium: 4.6 mEq/L (ref 3.5–5.1)
Sodium: 143 mEq/L (ref 135–145)
Total Bilirubin: 0.5 mg/dL (ref 0.2–1.2)
Total Protein: 6.5 g/dL (ref 6.0–8.3)

## 2022-03-21 LAB — HEMOGLOBIN A1C: Hgb A1c MFr Bld: 7.4 % — ABNORMAL HIGH (ref 4.6–6.5)

## 2022-03-21 NOTE — Progress Notes (Signed)
Subjective:  ? ?Chief Complaint  ?Patient presents with  ? Follow-up  ?  6 month ?Falling ?Sleep problems ?  ? ? ?Margaret Ward is a 81 y.o. female here for follow-up of diabetes.  Here w spouse and daughter.  ?[place]'s self monitored glucose range is high 100's.  ?Patient denies hypoglycemic reactions. ?She checks her glucose levels a few times per month. ?Patient does not require insulin.   ?Medications include: metformin XR 1000 mg/d ?Diet is healthy overall.  ?Exercise: none ?No SOB. CP due to falling.  ? ?GERD ?Takes Protonix 40 mg/d. Compliant, no AE's. S/s' are controlled. No diarrhea, N/V, bleeding, unexplained wt loss.  ? ?Falls ?Unfortunately the patient continues to fall.  She has been with home physical therapy several times.  She does feel it helps but when she is discharged, she is compliant with home exercise program but continues to feel less steady on her feet and weaker as she goes along.  She fell around 6 weeks ago resulting in a facial laceration and left elbow pain that they thought might have been a fracture.  The family is working to get someone to help with activities of daily living so she is not on her feet as much. ? ?Past Medical History:  ?Diagnosis Date  ? Anemia   ? Anemia due to chronic blood loss 02/05/2021  ? Arthritis   ? "hands, arms, all over the place" (03/02/2018)  ? Bilateral carpal tunnel syndrome 10/11/2019  ? Bronchospasm 03/28/2018  ? CAD (coronary artery disease) 02/08/2018  ? Left heart cath 01/13/18: 40% distal LMCAS 90% proximal LAD  LCF with 90% proximal ramus stenosis, diffuse RCA, 60% distal and 90% proximal PDA stenosis  ? CAD in native artery   ? a. s/p CABG in 01/2018. b. Recurrent CP 02/2018 -> cath showing early graft closure SVG-diagonal, received DES to D1.  ? CAD/s/p CABG Mar 2019 w/ DES to vein graft Apr 2019 05/21/2018  ? Cameron ulcer, acute 05/27/2018  ? Carpal tunnel syndrome, bilateral 12/28/2018  ? Chest pain 02/05/2018  ? Chronic combined systolic and  diastolic CHF (congestive heart failure) ([place])   ? a. LVEF 40-45% by echo 02/2018.  ? Chronic combined systolic and diastolic heart failure ([place]) 05/21/2018  ? Chronic diastolic heart failure ([place]) 03/28/2018  ? 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  ? Chronic hypoxemic respiratory failure ([place]) 05/21/2018  ? Chronic lower back pain   ? CKD (chronic kidney disease), stage III ([place])   ? Diabetes mellitus type 2 in obese ([place]) 04/23/2018  ? Falls frequently 09/04/2021  ? Gastric ulcer, unspecified as acute or chronic, without hemorrhage or perforation 05/27/2018  ? GERD (gastroesophageal reflux disease)   ? Glass eye   ? right eye  ? Gout   ? "on daily RX" (03/02/2018)  ? Heme positive stool 05/21/2018  ? History of anemia 02/05/2021  ? History of cholecystectomy 02/05/2021  ? History of kidney stones   ? HLD (hyperlipidemia) 05/21/2018  ? Hyperlipidemia   ? Hypertension   ? Hypertensive heart disease   ? Hypertensive heart disease with heart failure ([place]) 03/03/2018  ? Kidney cysts   ? Kidney disease   ? Left ventricular aneurysm   ? Migraine   ? "used to get them twice/week; haven't had them in a long time" (03/02/2018)  ? NSTEMI (non-ST elevated myocardial infarction) ([place]) 03/01/2018  ? Obesity (BMI 30-39.9) 07/25/2020  ? On home oxygen therapy   ? "3L at night and prn during  the day" (03/02/2018)  ? Physical deconditioning 10/24/2020  ? Pneumonia   ? used to get it twice/year; stopped after I had hysterectomy" (03/02/2018)  ? 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  ? Senile purpura ([place]) 09/04/2021  ? Sinus tachycardia 03/09/2018  ? Sleep apnea   ? SOB (shortness of breath) 03/09/2018  ? Symptomatic anemia 05/21/2018  ? Type II diabetes mellitus ([place])   ?  ? ?Related testing: ?Retinal exam: Done ?Pneumovax: PCV 20 today ? ?Objective:  ?BP 120/76   Pulse 83   Temp 98 ?F (36.7 ?C) (Oral)   Ht 5\' 2"  (1.575 m)   Wt 156 lb (70.8 kg)   SpO2 99%   BMI 28.53 kg/m?  ?General:  Well developed, well nourished, in no apparent distress ?Skin:  Warm, no pallor  or diaphoresis ?Lungs:  CTAB, no access msc use ?Cardio:  RRR, no bruits, no LE edema ?Psych: Age appropriate judgment and insight ? ?Assessment:  ? ?Type 2 diabetes mellitus with other ophthalmic complication, without long-term current use of insulin (HCC) - Plan: Comprehensive metabolic panel, Lipid panel, Hemoglobin A1c ? ?Gastroesophageal reflux disease, unspecified whether esophagitis present ? ?Falls frequently - Plan: Ambulatory referral to Home Health ? ?Senile purpura ([place]), Chronic ? ?Coronary artery disease of native artery of native heart with stable angina pectoris ([place]), Chronic ? ?Need for vaccination against Streptococcus pneumoniae - Plan: Pneumococcal conjugate vaccine 20-valent (Prevnar 20)  ? ?Plan:  ? ?Chronic, may not be stable.  Continue metformin XR 1000 mg daily.  Would increase to twice daily depending on A1c.  Goal is less than 8.  Counseled on diet and exercise. ?Chronic, stable.  Continue Protonix 40 mg daily. ?Chronic, unstable.  We will set up with home health again.  Recommended they ask the physical therapist for a more rigorous home exercise program to keep her strength up so she does not continue to fall. ?F/u in 3-6 mo pending above. ?The patient and her family members voiced understanding and agreement to the plan. ? ?Shelda Pal, DO ?03/21/22 ?11:39 AM ? ?

## 2022-03-21 NOTE — Patient Instructions (Addendum)
Use Voltaren gel for your hands.  ? ?Ice/cold pack over area for 10-15 min twice daily. ? ?OK to take Tylenol 1000 mg (2 extra strength tabs) or 975 mg (3 regular strength tabs) every 6 hours as needed. ? ?Let us know if you need anything. ? ?Hand Exercises ?Hand exercises can be helpful for almost anyone. These exercises can strengthen the hands, improve flexibility and movement, and increase blood flow to the hands. These results can make work and daily tasks easier. Hand exercises can be especially helpful for people who have joint pain from arthritis or have nerve damage from overuse (carpal tunnel syndrome). These exercises can also help people who have injured a hand. ?Exercises ?Most of these hand exercises are gentle stretching and motion exercises. It is usually safe to do them often throughout the day. Warming up your hands before exercise may help to reduce stiffness. You can do this with gentle massage or by placing your hands in warm water for 10-15 minutes. ?It is normal to feel some stretching, pulling, tightness, or mild discomfort as you begin new exercises. This will gradually improve. Stop an exercise right away if you feel sudden, severe pain or your pain gets worse. Ask your health care provider which exercises are best for you. ?Knuckle bend or "claw" fist ?Stand or sit with your arm, hand, and all five fingers pointed straight up. Make sure to keep your wrist straight during the exercise. ?Gently bend your fingers down toward your palm until the tips of your fingers are touching the top of your palm. Keep your big knuckle straight and just bend the small knuckles in your fingers. ?Hold this position for 3 seconds. ?Straighten (extend) your fingers back to the starting position. ?Repeat this exercise 5-10 times with each hand. ?Full finger fist ?Stand or sit with your arm, hand, and all five fingers pointed straight up. Make sure to keep your wrist straight during the exercise. ?Gently bend  your fingers into your palm until the tips of your fingers are touching the middle of your palm. ?Hold this position for 3 seconds. ?Extend your fingers back to the starting position, stretching every joint fully. ?Repeat this exercise 5-10 times with each hand. ?Straight fist ?Stand or sit with your arm, hand, and all five fingers pointed straight up. Make sure to keep your wrist straight during the exercise. ?Gently bend your fingers at the big knuckle, where your fingers meet your hand, and the middle knuckle. Keep the knuckle at the tips of your fingers straight and try to touch the bottom of your palm. ?Hold this position for 3 seconds. ?Extend your fingers back to the starting position, stretching every joint fully. ?Repeat this exercise 5-10 times with each hand. ?Tabletop ?Stand or sit with your arm, hand, and all five fingers pointed straight up. Make sure to keep your wrist straight during the exercise. ?Gently bend your fingers at the big knuckle, where your fingers meet your hand, as far down as you can while keeping the small knuckles in your fingers straight. Think of forming a tabletop with your fingers. ?Hold this position for 3 seconds. ?Extend your fingers back to the starting position, stretching every joint fully. ?Repeat this exercise 5-10 times with each hand. ?Finger spread ?Place your hand flat on a table with your palm facing down. Make sure your wrist stays straight as you do this exercise. ?Spread your fingers and thumb apart from each other as far as you can until you feel a  gentle stretch. Hold this position for 3 seconds. ?Bring your fingers and thumb tight together again. Hold this position for 3 seconds. ?Repeat this exercise 5-10 times with each hand. ?Making circles ?Stand or sit with your arm, hand, and all five fingers pointed straight up. Make sure to keep your wrist straight during the exercise. ?Make a circle by touching the tip of your thumb to the tip of your index  finger. ?Hold for 3 seconds. Then open your hand wide. ?Repeat this motion with your thumb and each finger on your hand. ?Repeat this exercise 5-10 times with each hand. ?Thumb motion ?Sit with your forearm resting on a table and your wrist straight. Your thumb should be facing up toward the ceiling. Keep your fingers relaxed as you move your thumb. ?Lift your thumb up as high as you can toward the ceiling. Hold for 3 seconds. ?Bend your thumb across your palm as far as you can, reaching the tip of your thumb for the small finger (pinkie) side of your palm. Hold for 3 seconds. ?Repeat this exercise 5-10 times with each hand. ?Grip strengthening ? ?Hold a stress ball or other soft ball in the middle of your hand. ?Slowly increase the pressure, squeezing the ball as much as you can without causing pain. Think of bringing the tips of your fingers into the middle of your palm. All of your finger joints should bend when doing this exercise. ?Hold your squeeze for 3 seconds, then relax. ?Repeat this exercise 5-10 times with each hand. ?Contact a health care provider if: ?Your hand pain or discomfort gets much worse when you do an exercise. ?Your hand pain or discomfort does not improve within 2 hours after you exercise. ?If you have any of these problems, stop doing these exercises right away. Do not do them again unless your health care provider says that you can. ?Get help right away if: ?You develop sudden, severe hand pain or swelling. If this happens, stop doing these exercises right away. Do not do them again unless your health care provider says that you can. ?Make sure you discuss any questions you have with your health care provider. ?Document Revised: 02/24/2019 Document Reviewed: 11/04/2018 ?Elsevier Patient Education ? [employer]. ? ?

## 2022-03-22 DIAGNOSIS — Z20822 Contact with and (suspected) exposure to covid-19: Secondary | ICD-10-CM | POA: Diagnosis not present

## 2022-03-24 DIAGNOSIS — Z20822 Contact with and (suspected) exposure to covid-19: Secondary | ICD-10-CM | POA: Diagnosis not present

## 2022-03-25 ENCOUNTER — Ambulatory Visit (HOSPITAL_BASED_OUTPATIENT_CLINIC_OR_DEPARTMENT_OTHER)
Admission: RE | Admit: 2022-03-25 | Discharge: 2022-03-25 | Disposition: A | Discharge status: Home / Self Care | Payer: Medicare Other | Source: Ambulatory Visit | Attending: Cardiology | Admitting: Cardiology

## 2022-03-25 DIAGNOSIS — I519 Heart disease, unspecified: Secondary | ICD-10-CM | POA: Diagnosis not present

## 2022-03-25 DIAGNOSIS — I2510 Atherosclerotic heart disease of native coronary artery without angina pectoris: Secondary | ICD-10-CM | POA: Diagnosis not present

## 2022-03-25 DIAGNOSIS — I5042 Chronic combined systolic (congestive) and diastolic (congestive) heart failure: Secondary | ICD-10-CM | POA: Insufficient documentation

## 2022-03-25 DIAGNOSIS — Z20828 Contact with and (suspected) exposure to other viral communicable diseases: Secondary | ICD-10-CM | POA: Diagnosis not present

## 2022-03-25 LAB — ECHOCARDIOGRAM COMPLETE
Area-P 1/2: 3.6 cm2
Calc EF: 56.1 %
S' Lateral: 3.3 cm
Single Plane A2C EF: 47.5 %
Single Plane A4C EF: 58.3 %

## 2022-03-25 NOTE — Progress Notes (Signed)
?  Echocardiogram ?2D Echocardiogram has been performed. ? ?Elmer Ramp ?03/25/2022, 9:31 AM ?

## 2022-03-26 ENCOUNTER — Other Ambulatory Visit: Payer: Self-pay | Admitting: Family Medicine

## 2022-03-26 DIAGNOSIS — N183 Chronic kidney disease, stage 3 unspecified: Secondary | ICD-10-CM | POA: Diagnosis not present

## 2022-03-26 DIAGNOSIS — I2510 Atherosclerotic heart disease of native coronary artery without angina pectoris: Secondary | ICD-10-CM | POA: Diagnosis not present

## 2022-03-26 DIAGNOSIS — G5603 Carpal tunnel syndrome, bilateral upper limbs: Secondary | ICD-10-CM | POA: Diagnosis not present

## 2022-03-26 DIAGNOSIS — I252 Old myocardial infarction: Secondary | ICD-10-CM | POA: Diagnosis not present

## 2022-03-26 DIAGNOSIS — M1990 Unspecified osteoarthritis, unspecified site: Secondary | ICD-10-CM | POA: Diagnosis not present

## 2022-03-26 DIAGNOSIS — Z951 Presence of aortocoronary bypass graft: Secondary | ICD-10-CM | POA: Diagnosis not present

## 2022-03-26 DIAGNOSIS — Z6830 Body mass index (BMI) 30.0-30.9, adult: Secondary | ICD-10-CM | POA: Diagnosis not present

## 2022-03-26 DIAGNOSIS — Z87442 Personal history of urinary calculi: Secondary | ICD-10-CM | POA: Diagnosis not present

## 2022-03-26 DIAGNOSIS — G43909 Migraine, unspecified, not intractable, without status migrainosus: Secondary | ICD-10-CM | POA: Diagnosis not present

## 2022-03-26 DIAGNOSIS — D500 Iron deficiency anemia secondary to blood loss (chronic): Secondary | ICD-10-CM | POA: Diagnosis not present

## 2022-03-26 DIAGNOSIS — Z8701 Personal history of pneumonia (recurrent): Secondary | ICD-10-CM | POA: Diagnosis not present

## 2022-03-26 DIAGNOSIS — I130 Hypertensive heart and chronic kidney disease with heart failure and stage 1 through stage 4 chronic kidney disease, or unspecified chronic kidney disease: Secondary | ICD-10-CM | POA: Diagnosis not present

## 2022-03-26 DIAGNOSIS — E669 Obesity, unspecified: Secondary | ICD-10-CM | POA: Diagnosis not present

## 2022-03-26 DIAGNOSIS — E1139 Type 2 diabetes mellitus with other diabetic ophthalmic complication: Secondary | ICD-10-CM | POA: Diagnosis not present

## 2022-03-26 DIAGNOSIS — I5042 Chronic combined systolic (congestive) and diastolic (congestive) heart failure: Secondary | ICD-10-CM | POA: Diagnosis not present

## 2022-03-26 DIAGNOSIS — D692 Other nonthrombocytopenic purpura: Secondary | ICD-10-CM | POA: Diagnosis not present

## 2022-03-26 DIAGNOSIS — J9611 Chronic respiratory failure with hypoxia: Secondary | ICD-10-CM | POA: Diagnosis not present

## 2022-03-26 DIAGNOSIS — M1A9XX0 Chronic gout, unspecified, without tophus (tophi): Secondary | ICD-10-CM | POA: Diagnosis not present

## 2022-03-26 DIAGNOSIS — Z9181 History of falling: Secondary | ICD-10-CM | POA: Diagnosis not present

## 2022-03-26 DIAGNOSIS — K219 Gastro-esophageal reflux disease without esophagitis: Secondary | ICD-10-CM | POA: Diagnosis not present

## 2022-03-26 DIAGNOSIS — M545 Low back pain, unspecified: Secondary | ICD-10-CM | POA: Diagnosis not present

## 2022-03-26 DIAGNOSIS — E1122 Type 2 diabetes mellitus with diabetic chronic kidney disease: Secondary | ICD-10-CM | POA: Diagnosis not present

## 2022-03-26 DIAGNOSIS — I25118 Atherosclerotic heart disease of native coronary artery with other forms of angina pectoris: Secondary | ICD-10-CM | POA: Diagnosis not present

## 2022-03-26 DIAGNOSIS — G8929 Other chronic pain: Secondary | ICD-10-CM | POA: Diagnosis not present

## 2022-03-26 DIAGNOSIS — E1169 Type 2 diabetes mellitus with other specified complication: Secondary | ICD-10-CM | POA: Diagnosis not present

## 2022-03-26 MED ORDER — ALLOPURINOL 100 MG PO TABS: ORAL_TABLET | ORAL | 3 refills | Status: DC

## 2022-03-28 DIAGNOSIS — H52222 Regular astigmatism, left eye: Secondary | ICD-10-CM | POA: Diagnosis not present

## 2022-03-28 DIAGNOSIS — E119 Type 2 diabetes mellitus without complications: Secondary | ICD-10-CM | POA: Diagnosis not present

## 2022-03-28 DIAGNOSIS — Z961 Presence of intraocular lens: Secondary | ICD-10-CM | POA: Diagnosis not present

## 2022-03-28 DIAGNOSIS — Z970 Presence of artificial eye: Secondary | ICD-10-CM | POA: Diagnosis not present

## 2022-03-31 DIAGNOSIS — K219 Gastro-esophageal reflux disease without esophagitis: Secondary | ICD-10-CM | POA: Diagnosis not present

## 2022-03-31 DIAGNOSIS — N183 Chronic kidney disease, stage 3 unspecified: Secondary | ICD-10-CM | POA: Diagnosis not present

## 2022-03-31 DIAGNOSIS — D500 Iron deficiency anemia secondary to blood loss (chronic): Secondary | ICD-10-CM | POA: Diagnosis not present

## 2022-03-31 DIAGNOSIS — I130 Hypertensive heart and chronic kidney disease with heart failure and stage 1 through stage 4 chronic kidney disease, or unspecified chronic kidney disease: Secondary | ICD-10-CM | POA: Diagnosis not present

## 2022-03-31 DIAGNOSIS — E1122 Type 2 diabetes mellitus with diabetic chronic kidney disease: Secondary | ICD-10-CM | POA: Diagnosis not present

## 2022-03-31 DIAGNOSIS — I5042 Chronic combined systolic (congestive) and diastolic (congestive) heart failure: Secondary | ICD-10-CM | POA: Diagnosis not present

## 2022-04-02 ENCOUNTER — Other Ambulatory Visit: Payer: Self-pay | Admitting: Family Medicine

## 2022-04-02 ENCOUNTER — Telehealth: Payer: Self-pay | Admitting: Family Medicine

## 2022-04-02 DIAGNOSIS — G5603 Carpal tunnel syndrome, bilateral upper limbs: Secondary | ICD-10-CM

## 2022-04-02 MED ORDER — EMPAGLIFLOZIN 25 MG PO TABS
25.0000 mg | ORAL_TABLET | Freq: Every day | ORAL | 2 refills | Status: DC
Start: 2022-04-02 — End: 2022-06-25

## 2022-04-02 NOTE — Telephone Encounter (Signed)
Any time she checks her BS it is over 200. ?Does not matter what she eats or not is over 200. ?She has changed nothing.  ?Wanted PCP to know so he can make a change in medication if needed. ?

## 2022-04-02 NOTE — Telephone Encounter (Signed)
Pt wanted to speak with robin regarding her sugar. He did not want to go into detail and would like Robin to call her back.  ?

## 2022-04-02 NOTE — Telephone Encounter (Signed)
Another med sent to take w metformin. Let's have her come back for f/u in a mo. Ty.  ?

## 2022-04-02 NOTE — Telephone Encounter (Signed)
Patient informed. Scheduled appt.

## 2022-04-03 DIAGNOSIS — D500 Iron deficiency anemia secondary to blood loss (chronic): Secondary | ICD-10-CM | POA: Diagnosis not present

## 2022-04-03 DIAGNOSIS — I130 Hypertensive heart and chronic kidney disease with heart failure and stage 1 through stage 4 chronic kidney disease, or unspecified chronic kidney disease: Secondary | ICD-10-CM | POA: Diagnosis not present

## 2022-04-03 DIAGNOSIS — N183 Chronic kidney disease, stage 3 unspecified: Secondary | ICD-10-CM | POA: Diagnosis not present

## 2022-04-03 DIAGNOSIS — I5042 Chronic combined systolic (congestive) and diastolic (congestive) heart failure: Secondary | ICD-10-CM | POA: Diagnosis not present

## 2022-04-03 DIAGNOSIS — K219 Gastro-esophageal reflux disease without esophagitis: Secondary | ICD-10-CM | POA: Diagnosis not present

## 2022-04-03 DIAGNOSIS — E1122 Type 2 diabetes mellitus with diabetic chronic kidney disease: Secondary | ICD-10-CM | POA: Diagnosis not present

## 2022-04-09 ENCOUNTER — Other Ambulatory Visit: Payer: Self-pay | Admitting: Family Medicine

## 2022-04-09 ENCOUNTER — Telehealth: Payer: Self-pay | Admitting: Family Medicine

## 2022-04-09 DIAGNOSIS — K219 Gastro-esophageal reflux disease without esophagitis: Secondary | ICD-10-CM | POA: Diagnosis not present

## 2022-04-09 DIAGNOSIS — E1122 Type 2 diabetes mellitus with diabetic chronic kidney disease: Secondary | ICD-10-CM | POA: Diagnosis not present

## 2022-04-09 DIAGNOSIS — J9611 Chronic respiratory failure with hypoxia: Secondary | ICD-10-CM

## 2022-04-09 DIAGNOSIS — D500 Iron deficiency anemia secondary to blood loss (chronic): Secondary | ICD-10-CM | POA: Diagnosis not present

## 2022-04-09 DIAGNOSIS — N183 Chronic kidney disease, stage 3 unspecified: Secondary | ICD-10-CM | POA: Diagnosis not present

## 2022-04-09 DIAGNOSIS — I130 Hypertensive heart and chronic kidney disease with heart failure and stage 1 through stage 4 chronic kidney disease, or unspecified chronic kidney disease: Secondary | ICD-10-CM | POA: Diagnosis not present

## 2022-04-09 DIAGNOSIS — I5032 Chronic diastolic (congestive) heart failure: Secondary | ICD-10-CM

## 2022-04-09 DIAGNOSIS — I5042 Chronic combined systolic (congestive) and diastolic (congestive) heart failure: Secondary | ICD-10-CM | POA: Diagnosis not present

## 2022-04-09 NOTE — Telephone Encounter (Signed)
Advise if ok to order. Last ordered in 2021.

## 2022-04-09 NOTE — Telephone Encounter (Signed)
Order entered in epic and faxed to [facility]. Also sent a community message to [employer].

## 2022-04-09 NOTE — Telephone Encounter (Signed)
Juliann Pulse [facility]) called stating that pt's oxygen machine is no longer working and the company who was servicing it is no longer in business. Juliann Pulse is requesting a referral for a new machine.

## 2022-04-10 NOTE — Telephone Encounter (Signed)
Previous office visit faxed to [facility]

## 2022-04-10 NOTE — Telephone Encounter (Signed)
Caller: Margaret Ward [facility] [phone]  OV notes and pt's saturation is needed and can be faxed to [phone]. please advise

## 2022-04-11 ENCOUNTER — Telehealth: Payer: Self-pay | Admitting: Family Medicine

## 2022-04-11 DIAGNOSIS — E1122 Type 2 diabetes mellitus with diabetic chronic kidney disease: Secondary | ICD-10-CM | POA: Diagnosis not present

## 2022-04-11 DIAGNOSIS — D500 Iron deficiency anemia secondary to blood loss (chronic): Secondary | ICD-10-CM | POA: Diagnosis not present

## 2022-04-11 DIAGNOSIS — N183 Chronic kidney disease, stage 3 unspecified: Secondary | ICD-10-CM | POA: Diagnosis not present

## 2022-04-11 DIAGNOSIS — I5042 Chronic combined systolic (congestive) and diastolic (congestive) heart failure: Secondary | ICD-10-CM | POA: Diagnosis not present

## 2022-04-11 DIAGNOSIS — K219 Gastro-esophageal reflux disease without esophagitis: Secondary | ICD-10-CM | POA: Diagnosis not present

## 2022-04-11 DIAGNOSIS — I130 Hypertensive heart and chronic kidney disease with heart failure and stage 1 through stage 4 chronic kidney disease, or unspecified chronic kidney disease: Secondary | ICD-10-CM | POA: Diagnosis not present

## 2022-04-11 NOTE — Telephone Encounter (Signed)
We have scheduled an appt with this patient on 04/17/22 in order to complete the form. Form is on [employer].

## 2022-04-11 NOTE — Telephone Encounter (Signed)
Called and scheduled for June 6 at 9:45 AM She cannot come in next week. The patient verbalized understanding regarding medication.

## 2022-04-11 NOTE — Telephone Encounter (Signed)
Olegario Messier the PT from [facility] is calling to make Dr. Carmelia Roller aware that the patient's weight on 05/24 was 151 and today her weight was 141.She also stated that the patient's bp on 05/24 was 96/60 and today it was at 90/58. She states her sugar has stayed around 229 to 239 and patient has complained of lightheadedness.

## 2022-04-11 NOTE — Telephone Encounter (Signed)
[facility] states they are still needing saturation on the oxygen. They need this faxed to [phone].

## 2022-04-11 NOTE — Telephone Encounter (Signed)
Let's hold Coreg until next week. I want to see her next week as well. Ty.

## 2022-04-16 DIAGNOSIS — K219 Gastro-esophageal reflux disease without esophagitis: Secondary | ICD-10-CM | POA: Diagnosis not present

## 2022-04-16 DIAGNOSIS — N183 Chronic kidney disease, stage 3 unspecified: Secondary | ICD-10-CM | POA: Diagnosis not present

## 2022-04-16 DIAGNOSIS — D500 Iron deficiency anemia secondary to blood loss (chronic): Secondary | ICD-10-CM | POA: Diagnosis not present

## 2022-04-16 DIAGNOSIS — I5042 Chronic combined systolic (congestive) and diastolic (congestive) heart failure: Secondary | ICD-10-CM | POA: Diagnosis not present

## 2022-04-16 DIAGNOSIS — E1122 Type 2 diabetes mellitus with diabetic chronic kidney disease: Secondary | ICD-10-CM | POA: Diagnosis not present

## 2022-04-16 DIAGNOSIS — I130 Hypertensive heart and chronic kidney disease with heart failure and stage 1 through stage 4 chronic kidney disease, or unspecified chronic kidney disease: Secondary | ICD-10-CM | POA: Diagnosis not present

## 2022-04-17 ENCOUNTER — Ambulatory Visit (INDEPENDENT_AMBULATORY_CARE_PROVIDER_SITE_OTHER): Payer: Medicare Other | Admitting: Family Medicine

## 2022-04-17 ENCOUNTER — Encounter: Payer: Self-pay | Admitting: Family Medicine

## 2022-04-17 VITALS — BP 108/62 | HR 68 | Temp 98.2°F | Ht 62.0 in | Wt 151.4 lb

## 2022-04-17 DIAGNOSIS — R634 Abnormal weight loss: Secondary | ICD-10-CM | POA: Diagnosis not present

## 2022-04-17 DIAGNOSIS — I10 Essential (primary) hypertension: Secondary | ICD-10-CM

## 2022-04-17 NOTE — Progress Notes (Signed)
Chief Complaint  Patient presents with   Hypotension    Weight loss     Subjective: Patient is a 81 y.o. female here for wt loss. Here w daughter.   Lost around 5 lbs in last mo. Started on Jardiance for heart failure and DM after ins refused to pay for Farxiga. No fatigue or dizziness. Appetite around same, food doesn't taste as good and she doesn't eat as much.  Sugars have been running in the low 100s.  She denies urinating more frequently.  No nausea, vomiting, new diarrhea, new shortness of breath, hemoptysis, medication changes otherwise noted.  She has been working with physical therapy and feels better with energy levels and strength.  She is having lower BP's, but nothing SBP less than 90.  Denies any dizziness or lightheadedness.  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 s/p CABG in 01/2018. b. Recurrent CP 02/2018 -> cath showing early graft closure SVG-diagonal, received DES to D1.   CAD/s/p CABG Mar 2019 w/ DES to vein graft Apr 2019 05/21/2018   Cameron ulcer, acute 05/27/2018   Carpal tunnel syndrome, bilateral 12/28/2018   Chest pain 02/05/2018   Chronic combined systolic and diastolic CHF (congestive heart failure) ([place])    a. LVEF 40-45% by echo 02/2018.   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   Chronic hypoxemic respiratory failure ([place]) 05/21/2018   Chronic lower back pain    CKD (chronic kidney disease), stage III (HCC)    Diabetes mellitus type  2 in obese ([place]) 04/23/2018   Falls frequently 09/04/2021   Gastric ulcer, unspecified as acute or chronic, without hemorrhage or perforation 05/27/2018   GERD (gastroesophageal reflux disease)    Glass eye    right eye   Gout    "on daily RX" (03/02/2018)   Heme positive stool 05/21/2018   History of anemia 02/05/2021   History of cholecystectomy 02/05/2021   History of kidney stones    HLD (hyperlipidemia) 05/21/2018   Hyperlipidemia    Hypertension    Hypertensive heart disease    Hypertensive heart disease with heart failure ([place]) 03/03/2018   Kidney cysts    Kidney disease    Left ventricular aneurysm    Migraine    "used to get them twice/week; haven't had them in a long time" (03/02/2018)   NSTEMI (non-ST elevated myocardial infarction) ([place]) 03/01/2018   Obesity (BMI 30-39.9) 07/25/2020   On home oxygen therapy    "3L at night and prn during the day" (03/02/2018)   Physical deconditioning 10/24/2020   Pneumonia    used to get it twice/year; stopped after I had hysterectomy" (03/02/2018)   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 No accessory muscle use Psych: Age appropriate judgment and insight, normal affect and mood  Assessment and Plan: Weight loss  Primary hypertension  I think this is related to her recent initiation on Jardiance.  Her energy levels are sound.  She has no new coughing, bleeding.  All recent laboratory studies are normal.  This is not due to hypoglycemia given her home readings.  Reassurance given for now.  She will let me know if anything  changes. I think things are stable.  She is not having any symptoms of hypoglycemia.  She has a history of heart failure and coronary artery disease.  She is on Entresto and spironolactone.  Could stop diabetes but could compromise clinical wellness.  Unless she starts having symptoms or systolic blood pressure drops below 90, would hold off on changing this. The patient and her daughter voiced understanding and agreement to the plan.  I spent 30 min w pt and daughter discussing the above plans in addition to reviewing her chart on the same day of the visit.   [place], DO 04/17/22  10:09 AM

## 2022-04-17 NOTE — Patient Instructions (Addendum)
Jardiance can cause weight loss. This should plateau.  Taste wanes as we age which can explain lower interest in food.   Take Metamucil or Benefiber daily.  Keep the diet clean and stay active.  I am OK with lower blood pressure at this time if you are not having symptoms. Stay hydrated.   Let us know if you need anything.

## 2022-04-18 DIAGNOSIS — I130 Hypertensive heart and chronic kidney disease with heart failure and stage 1 through stage 4 chronic kidney disease, or unspecified chronic kidney disease: Secondary | ICD-10-CM | POA: Diagnosis not present

## 2022-04-18 DIAGNOSIS — E1122 Type 2 diabetes mellitus with diabetic chronic kidney disease: Secondary | ICD-10-CM | POA: Diagnosis not present

## 2022-04-18 DIAGNOSIS — N183 Chronic kidney disease, stage 3 unspecified: Secondary | ICD-10-CM | POA: Diagnosis not present

## 2022-04-18 DIAGNOSIS — K219 Gastro-esophageal reflux disease without esophagitis: Secondary | ICD-10-CM | POA: Diagnosis not present

## 2022-04-18 DIAGNOSIS — I5042 Chronic combined systolic (congestive) and diastolic (congestive) heart failure: Secondary | ICD-10-CM | POA: Diagnosis not present

## 2022-04-18 DIAGNOSIS — D500 Iron deficiency anemia secondary to blood loss (chronic): Secondary | ICD-10-CM | POA: Diagnosis not present

## 2022-04-22 ENCOUNTER — Ambulatory Visit: Payer: Medicare Other | Admitting: Family Medicine

## 2022-04-22 DIAGNOSIS — N183 Chronic kidney disease, stage 3 unspecified: Secondary | ICD-10-CM | POA: Diagnosis not present

## 2022-04-22 DIAGNOSIS — K219 Gastro-esophageal reflux disease without esophagitis: Secondary | ICD-10-CM | POA: Diagnosis not present

## 2022-04-22 DIAGNOSIS — I130 Hypertensive heart and chronic kidney disease with heart failure and stage 1 through stage 4 chronic kidney disease, or unspecified chronic kidney disease: Secondary | ICD-10-CM | POA: Diagnosis not present

## 2022-04-22 DIAGNOSIS — I5042 Chronic combined systolic (congestive) and diastolic (congestive) heart failure: Secondary | ICD-10-CM | POA: Diagnosis not present

## 2022-04-22 DIAGNOSIS — E1122 Type 2 diabetes mellitus with diabetic chronic kidney disease: Secondary | ICD-10-CM | POA: Diagnosis not present

## 2022-04-22 DIAGNOSIS — D500 Iron deficiency anemia secondary to blood loss (chronic): Secondary | ICD-10-CM | POA: Diagnosis not present

## 2022-04-23 DIAGNOSIS — E1122 Type 2 diabetes mellitus with diabetic chronic kidney disease: Secondary | ICD-10-CM | POA: Diagnosis not present

## 2022-04-23 DIAGNOSIS — I5042 Chronic combined systolic (congestive) and diastolic (congestive) heart failure: Secondary | ICD-10-CM | POA: Diagnosis not present

## 2022-04-23 DIAGNOSIS — I130 Hypertensive heart and chronic kidney disease with heart failure and stage 1 through stage 4 chronic kidney disease, or unspecified chronic kidney disease: Secondary | ICD-10-CM | POA: Diagnosis not present

## 2022-04-23 DIAGNOSIS — D500 Iron deficiency anemia secondary to blood loss (chronic): Secondary | ICD-10-CM | POA: Diagnosis not present

## 2022-04-23 DIAGNOSIS — N183 Chronic kidney disease, stage 3 unspecified: Secondary | ICD-10-CM | POA: Diagnosis not present

## 2022-04-23 DIAGNOSIS — K219 Gastro-esophageal reflux disease without esophagitis: Secondary | ICD-10-CM | POA: Diagnosis not present

## 2022-04-25 DIAGNOSIS — G8929 Other chronic pain: Secondary | ICD-10-CM | POA: Diagnosis not present

## 2022-04-25 DIAGNOSIS — I5042 Chronic combined systolic (congestive) and diastolic (congestive) heart failure: Secondary | ICD-10-CM | POA: Diagnosis not present

## 2022-04-25 DIAGNOSIS — M1990 Unspecified osteoarthritis, unspecified site: Secondary | ICD-10-CM | POA: Diagnosis not present

## 2022-04-25 DIAGNOSIS — E1122 Type 2 diabetes mellitus with diabetic chronic kidney disease: Secondary | ICD-10-CM | POA: Diagnosis not present

## 2022-04-25 DIAGNOSIS — Z6830 Body mass index (BMI) 30.0-30.9, adult: Secondary | ICD-10-CM | POA: Diagnosis not present

## 2022-04-25 DIAGNOSIS — E1139 Type 2 diabetes mellitus with other diabetic ophthalmic complication: Secondary | ICD-10-CM | POA: Diagnosis not present

## 2022-04-25 DIAGNOSIS — Z8701 Personal history of pneumonia (recurrent): Secondary | ICD-10-CM | POA: Diagnosis not present

## 2022-04-25 DIAGNOSIS — Z87442 Personal history of urinary calculi: Secondary | ICD-10-CM | POA: Diagnosis not present

## 2022-04-25 DIAGNOSIS — E1169 Type 2 diabetes mellitus with other specified complication: Secondary | ICD-10-CM | POA: Diagnosis not present

## 2022-04-25 DIAGNOSIS — Z951 Presence of aortocoronary bypass graft: Secondary | ICD-10-CM | POA: Diagnosis not present

## 2022-04-25 DIAGNOSIS — D500 Iron deficiency anemia secondary to blood loss (chronic): Secondary | ICD-10-CM | POA: Diagnosis not present

## 2022-04-25 DIAGNOSIS — M545 Low back pain, unspecified: Secondary | ICD-10-CM | POA: Diagnosis not present

## 2022-04-25 DIAGNOSIS — D692 Other nonthrombocytopenic purpura: Secondary | ICD-10-CM | POA: Diagnosis not present

## 2022-04-25 DIAGNOSIS — I130 Hypertensive heart and chronic kidney disease with heart failure and stage 1 through stage 4 chronic kidney disease, or unspecified chronic kidney disease: Secondary | ICD-10-CM | POA: Diagnosis not present

## 2022-04-25 DIAGNOSIS — Z9181 History of falling: Secondary | ICD-10-CM | POA: Diagnosis not present

## 2022-04-25 DIAGNOSIS — E669 Obesity, unspecified: Secondary | ICD-10-CM | POA: Diagnosis not present

## 2022-04-25 DIAGNOSIS — I25118 Atherosclerotic heart disease of native coronary artery with other forms of angina pectoris: Secondary | ICD-10-CM | POA: Diagnosis not present

## 2022-04-25 DIAGNOSIS — K219 Gastro-esophageal reflux disease without esophagitis: Secondary | ICD-10-CM | POA: Diagnosis not present

## 2022-04-25 DIAGNOSIS — I252 Old myocardial infarction: Secondary | ICD-10-CM | POA: Diagnosis not present

## 2022-04-25 DIAGNOSIS — G5603 Carpal tunnel syndrome, bilateral upper limbs: Secondary | ICD-10-CM | POA: Diagnosis not present

## 2022-04-25 DIAGNOSIS — M1A9XX0 Chronic gout, unspecified, without tophus (tophi): Secondary | ICD-10-CM | POA: Diagnosis not present

## 2022-04-25 DIAGNOSIS — J9611 Chronic respiratory failure with hypoxia: Secondary | ICD-10-CM | POA: Diagnosis not present

## 2022-04-25 DIAGNOSIS — G43909 Migraine, unspecified, not intractable, without status migrainosus: Secondary | ICD-10-CM | POA: Diagnosis not present

## 2022-04-25 DIAGNOSIS — N183 Chronic kidney disease, stage 3 unspecified: Secondary | ICD-10-CM | POA: Diagnosis not present

## 2022-04-25 DIAGNOSIS — I2510 Atherosclerotic heart disease of native coronary artery without angina pectoris: Secondary | ICD-10-CM | POA: Diagnosis not present

## 2022-04-30 ENCOUNTER — Ambulatory Visit: Payer: Medicare Other | Admitting: Family Medicine

## 2022-05-01 DIAGNOSIS — I130 Hypertensive heart and chronic kidney disease with heart failure and stage 1 through stage 4 chronic kidney disease, or unspecified chronic kidney disease: Secondary | ICD-10-CM | POA: Diagnosis not present

## 2022-05-01 DIAGNOSIS — K219 Gastro-esophageal reflux disease without esophagitis: Secondary | ICD-10-CM | POA: Diagnosis not present

## 2022-05-01 DIAGNOSIS — E1122 Type 2 diabetes mellitus with diabetic chronic kidney disease: Secondary | ICD-10-CM | POA: Diagnosis not present

## 2022-05-01 DIAGNOSIS — I5042 Chronic combined systolic (congestive) and diastolic (congestive) heart failure: Secondary | ICD-10-CM | POA: Diagnosis not present

## 2022-05-01 DIAGNOSIS — D500 Iron deficiency anemia secondary to blood loss (chronic): Secondary | ICD-10-CM | POA: Diagnosis not present

## 2022-05-01 DIAGNOSIS — N183 Chronic kidney disease, stage 3 unspecified: Secondary | ICD-10-CM | POA: Diagnosis not present

## 2022-05-06 ENCOUNTER — Ambulatory Visit: Payer: Medicare Other | Admitting: Family Medicine

## 2022-05-08 DIAGNOSIS — E1122 Type 2 diabetes mellitus with diabetic chronic kidney disease: Secondary | ICD-10-CM | POA: Diagnosis not present

## 2022-05-08 DIAGNOSIS — D500 Iron deficiency anemia secondary to blood loss (chronic): Secondary | ICD-10-CM | POA: Diagnosis not present

## 2022-05-08 DIAGNOSIS — N183 Chronic kidney disease, stage 3 unspecified: Secondary | ICD-10-CM | POA: Diagnosis not present

## 2022-05-08 DIAGNOSIS — I130 Hypertensive heart and chronic kidney disease with heart failure and stage 1 through stage 4 chronic kidney disease, or unspecified chronic kidney disease: Secondary | ICD-10-CM | POA: Diagnosis not present

## 2022-05-08 DIAGNOSIS — K219 Gastro-esophageal reflux disease without esophagitis: Secondary | ICD-10-CM | POA: Diagnosis not present

## 2022-05-08 DIAGNOSIS — I5042 Chronic combined systolic (congestive) and diastolic (congestive) heart failure: Secondary | ICD-10-CM | POA: Diagnosis not present

## 2022-06-11 ENCOUNTER — Other Ambulatory Visit: Payer: Self-pay | Admitting: Cardiology

## 2022-06-11 DIAGNOSIS — I214 Non-ST elevation (NSTEMI) myocardial infarction: Secondary | ICD-10-CM

## 2022-06-11 DIAGNOSIS — I10 Essential (primary) hypertension: Secondary | ICD-10-CM

## 2022-06-11 DIAGNOSIS — I5032 Chronic diastolic (congestive) heart failure: Secondary | ICD-10-CM

## 2022-06-19 ENCOUNTER — Other Ambulatory Visit: Payer: Self-pay

## 2022-06-19 DIAGNOSIS — I110 Hypertensive heart disease with heart failure: Secondary | ICD-10-CM

## 2022-06-19 DIAGNOSIS — I5032 Chronic diastolic (congestive) heart failure: Secondary | ICD-10-CM

## 2022-06-19 MED ORDER — FUROSEMIDE 40 MG PO TABS: ORAL_TABLET | ORAL | 0 refills | Status: DC

## 2022-06-25 ENCOUNTER — Other Ambulatory Visit: Payer: Self-pay | Admitting: Family Medicine

## 2022-07-07 ENCOUNTER — Ambulatory Visit (INDEPENDENT_AMBULATORY_CARE_PROVIDER_SITE_OTHER): Payer: Medicare Other

## 2022-07-07 VITALS — Ht 62.0 in | Wt 144.0 lb

## 2022-07-07 DIAGNOSIS — Z0000 Encounter for general adult medical examination without abnormal findings: Secondary | ICD-10-CM | POA: Diagnosis not present

## 2022-07-07 NOTE — Patient Instructions (Signed)
Margaret Ward , Thank you for taking time to complete your Medicare Wellness Visit. I appreciate your ongoing commitment to your health goals. Please review the following plan we discussed and let me know if I can assist you in the future.   Screening recommendations/referrals: Colonoscopy: No longer required Mammogram: Declined Bone Density: Declined Recommended yearly ophthalmology/optometry visit for glaucoma screening and checkup Recommended yearly dental visit for hygiene and checkup  Vaccinations: Influenza vaccine: Up to date Pneumococcal vaccine: Up to date Tdap vaccine: Up to date Shingles vaccine: Due-May obtain vaccine at your local pharmacy. Covid-19:May obtain vaccine at your local pharmacy.  Advanced directives: Copy in chart  Conditions/risks identified: See problem list  Next appointment: Follow up in one year for your annual wellness visit    Preventive Care 65 Years and Older, Female Preventive care refers to lifestyle choices and visits with your health care provider that can promote health and wellness. What does preventive care include? A yearly physical exam. This is also called an annual well check. Dental exams once or twice a year. Routine eye exams. Ask your health care provider how often you should have your eyes checked. Personal lifestyle choices, including: Daily care of your teeth and gums. Regular physical activity. Eating a healthy diet. Avoiding tobacco and drug use. Limiting alcohol use. Practicing safe sex. Taking low-dose aspirin every day. Taking vitamin and mineral supplements as recommended by your health care provider. What happens during an annual well check? The services and screenings done by your health care provider during your annual well check will depend on your age, overall health, lifestyle risk factors, and family history of disease. Counseling  Your health care provider may ask you questions about your: Alcohol use. Tobacco  use. Drug use. Emotional well-being. Home and relationship well-being. Sexual activity. Eating habits. History of falls. Memory and ability to understand (cognition). Work and work Astronomer. Reproductive health. Screening  You may have the following tests or measurements: Height, weight, and BMI. Blood pressure. Lipid and cholesterol levels. These may be checked every 5 years, or more frequently if you are over 68 years old. Skin check. Lung cancer screening. You may have this screening every year starting at age 109 if you have a 30-pack-year history of smoking and currently smoke or have quit within the past 15 years. Fecal occult blood test (FOBT) of the stool. You may have this test every year starting at age 47. Flexible sigmoidoscopy or colonoscopy. You may have a sigmoidoscopy every 5 years or a colonoscopy every 10 years starting at age 82. Hepatitis C blood test. Hepatitis B blood test. Sexually transmitted disease (STD) testing. Diabetes screening. This is done by checking your blood sugar (glucose) after you have not eaten for a while (fasting). You may have this done every 1-3 years. Bone density scan. This is done to screen for osteoporosis. You may have this done starting at age 82. Mammogram. This may be done every 1-2 years. Talk to your health care provider about how often you should have regular mammograms. Talk with your health care provider about your test results, treatment options, and if necessary, the need for more tests. Vaccines  Your health care provider may recommend certain vaccines, such as: Influenza vaccine. This is recommended every year. Tetanus, diphtheria, and acellular pertussis (Tdap, Td) vaccine. You may need a Td booster every 10 years. Zoster vaccine. You may need this after age 82. Pneumococcal 13-valent conjugate (PCV13) vaccine. One dose is recommended after age 82. Pneumococcal polysaccharide (  PPSV23) vaccine. One dose is recommended  after age 82. Talk to your health care provider about which screenings and vaccines you need and how often you need them. This information is not intended to replace advice given to you by your health care provider. Make sure you discuss any questions you have with your health care provider. Document Released: 11/30/2015 Document Revised: 07/23/2016 Document Reviewed: 09/04/2015 Elsevier Interactive Patient Education  2017 [place] Prevention in the Home Falls can cause injuries. They can happen to people of all ages. There are many things you can do to make your home safe and to help prevent falls. What can I do on the outside of my home? Regularly fix the edges of walkways and driveways and fix any cracks. Remove anything that might make you trip as you walk through a door, such as a raised step or threshold. Trim any bushes or trees on the path to your home. Use bright outdoor lighting. Clear any walking paths of anything that might make someone trip, such as rocks or tools. Regularly check to see if handrails are loose or broken. Make sure that both sides of any steps have handrails. Any raised decks and porches should have guardrails on the edges. Have any leaves, snow, or ice cleared regularly. Use sand or salt on walking paths during winter. Clean up any spills in your garage right away. This includes oil or grease spills. What can I do in the bathroom? Use night lights. Install grab bars by the toilet and in the tub and shower. Do not use towel bars as grab bars. Use non-skid mats or decals in the tub or shower. If you need to sit down in the shower, use a plastic, non-slip stool. Keep the floor dry. Clean up any water that spills on the floor as soon as it happens. Remove soap buildup in the tub or shower regularly. Attach bath mats securely with double-sided non-slip rug tape. Do not have throw rugs and other things on the floor that can make you trip. What can I do  in the bedroom? Use night lights. Make sure that you have a light by your bed that is easy to reach. Do not use any sheets or blankets that are too big for your bed. They should not hang down onto the floor. Have a firm chair that has side arms. You can use this for support while you get dressed. Do not have throw rugs and other things on the floor that can make you trip. What can I do in the kitchen? Clean up any spills right away. Avoid walking on wet floors. Keep items that you use a lot in easy-to-reach places. If you need to reach something above you, use a strong step stool that has a grab bar. Keep electrical cords out of the way. Do not use floor polish or wax that makes floors slippery. If you must use wax, use non-skid floor wax. Do not have throw rugs and other things on the floor that can make you trip. What can I do with my stairs? Do not leave any items on the stairs. Make sure that there are handrails on both sides of the stairs and use them. Fix handrails that are broken or loose. Make sure that handrails are as long as the stairways. Check any carpeting to make sure that it is firmly attached to the stairs. Fix any carpet that is loose or worn. Avoid having throw rugs at the top or  bottom of the stairs. If you do have throw rugs, attach them to the floor with carpet tape. Make sure that you have a light switch at the top of the stairs and the bottom of the stairs. If you do not have them, ask someone to add them for you. What else can I do to help prevent falls? Wear shoes that: Do not have high heels. Have rubber bottoms. Are comfortable and fit you well. Are closed at the toe. Do not wear sandals. If you use a stepladder: Make sure that it is fully opened. Do not climb a closed stepladder. Make sure that both sides of the stepladder are locked into place. Ask someone to hold it for you, if possible. Clearly mark and make sure that you can see: Any grab bars or  handrails. First and last steps. Where the edge of each step is. Use tools that help you move around (mobility aids) if they are needed. These include: Canes. Walkers. Scooters. Crutches. Turn on the lights when you go into a dark area. Replace any light bulbs as soon as they burn out. Set up your furniture so you have a clear path. Avoid moving your furniture around. If any of your floors are uneven, fix them. If there are any pets around you, be aware of where they are. Review your medicines with your doctor. Some medicines can make you feel dizzy. This can increase your chance of falling. Ask your doctor what other things that you can do to help prevent falls. This information is not intended to replace advice given to you by your health care provider. Make sure you discuss any questions you have with your health care provider. Document Released: 08/30/2009 Document Revised: 04/10/2016 Document Reviewed: 12/08/2014 Elsevier Interactive Patient Education  2017 [employer].

## 2022-07-07 NOTE — Progress Notes (Signed)
Subjective:   Margaret Ward is a 81 y.o. female who presents for an Initial Medicare Annual Wellness Visit.  I connected with Ayn today by telephone and verified that I am speaking with the correct person using two identifiers. Location patient: home Location provider: work Persons participating in the virtual visit: patient, Marine scientist.    I discussed the limitations, risks, security and privacy concerns of performing an evaluation and management service by telephone and the availability of in person appointments. I also discussed with the patient that there may be a patient responsible charge related to this service. The patient expressed understanding and verbally consented to this telephonic visit.    Interactive audio and video telecommunications were attempted between this provider and patient, however failed, due to patient having technical difficulties OR patient did not have access to video capability.  We continued and completed visit with audio only.  Some vital signs may be absent or patient reported.   Time Spent with patient on telephone encounter: 20 minutes   Review of Systems     Cardiac Risk Factors include: advanced age (>3men, >71 women);hypertension;diabetes mellitus;dyslipidemia     Objective:    Today's Vitals   07/07/22 0950  Weight: 144 lb (65.3 kg)  Height: 5\' 2"  (1.575 m)   Body mass index is 26.34 kg/m.     07/07/2022    9:53 AM 06/13/2020    9:25 AM 05/30/2019    7:02 PM 05/21/2018    6:00 PM 03/06/2018   10:34 AM 03/02/2018    2:51 PM  Advanced Directives  Does Patient Have a Medical Advance Directive? Yes Yes Yes Yes No No  Type of Paramedic of [place];Living will [place];Living will [place];Living will [place];Living will    Does patient want to make changes to medical advance directive?    No - Patient declined    Copy of [place] in Chart? Yes - validated most recent copy scanned in chart (See row information)   No - copy requested    Would patient like information on creating a medical advance directive?     No - Patient declined Yes (Inpatient - patient defers creating a medical advance directive at this time)    Current Medications (verified) Outpatient Encounter Medications as of 07/07/2022  Medication Sig   acetaminophen (TYLENOL) 500 MG tablet Take 1,000 mg by mouth every 6 (six) hours as needed for headache.    allopurinol (ZYLOPRIM) 100 MG tablet TAKE 1 TABLET(100 MG) BY MOUTH DAILY   atorvastatin (LIPITOR) 40 MG tablet TAKE 1 TABLET(40 MG) BY MOUTH AT BEDTIME   clopidogrel (PLAVIX) 75 MG tablet TAKE 1 TABLET(75 MG) BY MOUTH DAILY   DULoxetine (CYMBALTA) 20 MG capsule TAKE 1 CAPSULE(20 MG) BY MOUTH DAILY   furosemide (LASIX) 40 MG tablet TAKE 1 TABLET BY MOUTH EVERY DAY, TAKE ADDITIONAL TABLET ON MONDAY, WEDNESDAY, AND FRIDAY   JARDIANCE 25 MG TABS tablet TAKE 1 TABLET(25 MG) BY MOUTH DAILY   metFORMIN (GLUCOPHAGE-XR) 500 MG 24 hr tablet TAKE 2 TABLETS(1000 MG) BY MOUTH DAILY WITH BREAKFAST   Multiple Vitamin (MULTIVITAMIN WITH MINERALS) TABS tablet Take 1 tablet by mouth daily.   nitroGLYCERIN (NITROSTAT) 0.4 MG SL tablet Place 1 tablet (0.4 mg total) under the tongue every 5 (five) minutes x 3 doses as needed for chest pain.   pantoprazole (PROTONIX) 20 MG tablet TAKE 1 TABLET(20 MG) BY MOUTH DAILY  Pseudoeph-Doxylamine-DM-APAP (NYQUIL PO) Take 1 Dose by mouth at bedtime as needed (congestion).    sacubitril-valsartan (ENTRESTO) 49-51 MG Take 1 tablet by mouth 2 (two) times daily.   spironolactone (ALDACTONE) 25 MG tablet TAKE 1/2 TABLET(12.5 MG) BY MOUTH DAILY   vitamin C (ASCORBIC ACID) 500 MG tablet Take 500 mg by mouth 2 (two) times daily.   No facility-administered encounter medications on file as of 07/07/2022.    Allergies (verified) Methylprednisolone, Keflex [cephalexin], Ketorolac,  Morphine and related, Novocain [procaine], Ramipril, and Septra [sulfamethoxazole-trimethoprim]   History: Past Medical History:  Diagnosis Date   Anemia    Anemia due to chronic blood loss 02/05/2021   Arthritis    "hands, arms, all over the place" (03/02/2018)   Bilateral carpal tunnel syndrome 10/11/2019   Bronchospasm 03/28/2018   CAD (coronary artery disease) 02/08/2018   Left heart cath 01/13/18: 40% distal LMCAS 90% proximal LAD  LCF with 90% proximal ramus stenosis, diffuse RCA, 60% distal and 90% proximal PDA stenosis   CAD in native artery    a. s/p CABG in 01/2018. b. Recurrent CP 02/2018 -> cath showing early graft closure SVG-diagonal, received DES to D1.   CAD/s/p CABG Mar 2019 w/ DES to vein graft Apr 2019 05/21/2018   Cameron ulcer, acute 05/27/2018   Carpal tunnel syndrome, bilateral 12/28/2018   Chest pain 02/05/2018   Chronic combined systolic and diastolic CHF (congestive heart failure) ([place])    a. LVEF 40-45% by echo 02/2018.   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   Chronic hypoxemic respiratory failure ([place]) 05/21/2018   Chronic lower back pain    CKD (chronic kidney disease), stage III (HCC)    Diabetes mellitus type 2 in obese ([place]) 04/23/2018   Falls frequently 09/04/2021   Gastric ulcer, unspecified as acute or chronic, without hemorrhage or perforation 05/27/2018   GERD (gastroesophageal reflux disease)    Glass eye    right eye   Gout    "on daily RX" (03/02/2018)   Heme positive stool 05/21/2018   History of anemia 02/05/2021   History of cholecystectomy 02/05/2021   History of kidney stones    HLD (hyperlipidemia) 05/21/2018   Hyperlipidemia    Hypertension    Hypertensive heart disease    Hypertensive heart disease with heart failure ([place]) 03/03/2018   Kidney cysts    Kidney disease    Left ventricular aneurysm    Migraine    "used to get  them twice/week; haven't had them in a long time" (03/02/2018)   NSTEMI (non-ST elevated myocardial infarction) ([place]) 03/01/2018   Obesity (BMI 30-39.9) 07/25/2020   On home oxygen therapy    "3L at night and prn during the day" (03/02/2018)   Physical deconditioning 10/24/2020   Pneumonia    used to get it twice/year; stopped after I had hysterectomy" (03/02/2018)   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   Senile purpura ([place]) 09/04/2021   Sinus tachycardia 03/09/2018   Sleep apnea    SOB (shortness of breath) 03/09/2018   Symptomatic anemia 05/21/2018   Type II diabetes mellitus ([place])    Past Surgical History:  Procedure Laterality Date   BACK SURGERY     BIOPSY  05/22/2018   Procedure: BIOPSY;  Surgeon: Otis Brace, MD;  Location: [facility];  Service: Gastroenterology;;   CARDIAC CATHETERIZATION  12/2017   "before [facility]"   [place]  CORONARY ARTERY BYPASS GRAFT  01/19/2018   "CABG X3; in [place]"   CORONARY STENT INTERVENTION N/A 03/02/2018   Procedure: CORONARY STENT INTERVENTION;  Surgeon: Burnell Blanks, MD;  Location: [facility];  Service: Cardiovascular;  Laterality: N/A;   DILATION AND CURETTAGE OF UTERUS     ESOPHAGOGASTRODUODENOSCOPY (EGD) WITH PROPOFOL N/A 05/22/2018   Procedure: ESOPHAGOGASTRODUODENOSCOPY (EGD) WITH PROPOFOL;  Surgeon: Otis Brace, MD;  Location: [facility];  Service: Gastroenterology;  Laterality: N/A;   ESOPHAGOGASTRODUODENOSCOPY (EGD) WITH PROPOFOL N/A 05/24/2018   Procedure: ESOPHAGOGASTRODUODENOSCOPY (EGD) WITH PROPOFOL;  Surgeon: Otis Brace, MD;  Location: [facility];  Service: Gastroenterology;  Laterality: N/A;   EYE SURGERY Right    "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"   KNEE ARTHROSCOPY Right X 4   LEFT HEART CATH AND CORS/GRAFTS ANGIOGRAPHY N/A 03/02/2018   Procedure: LEFT HEART CATH AND CORS/GRAFTS ANGIOGRAPHY;  Surgeon: Burnell Blanks, MD;  Location: [facility];  Service: Cardiovascular;  Laterality:  N/A;   LEFT HEART CATH AND CORS/GRAFTS ANGIOGRAPHY N/A 06/13/2020   Procedure: LEFT HEART CATH AND CORS/GRAFTS ANGIOGRAPHY;  Surgeon: Burnell Blanks, MD;  Location: [facility];  Service: Cardiovascular;  Laterality: N/A;   LUMBAR DISC SURGERY     REDUCTION MAMMAPLASTY Bilateral X 2   SCHLEROTHERAPY  05/24/2018   Procedure: SCHLEROTHERAPY;  Surgeon: Otis Brace, MD;  [facility];  Service: Gastroenterology;;   [place]   Family History  Problem Relation Age of Onset   Heart Problems Mother    Diabetes Father    Kidney disease Sister    Heart disease Brother    Hypertension Brother    Kidney disease Brother    Hypertension Daughter    Hypertension Daughter    Hypertension Daughter    Hypertension Daughter    Social History   Socioeconomic History   Marital status: Married    Spouse name: Not on file   Number of children: Not on file   Years of education: Not on file   Highest education level: Not on file  Occupational History   Not on file  Tobacco Use   Smoking status: Former    Packs/day: 0.12    Years: 3.00    Total pack years: 0.36    Types: Cigarettes    Quit date: 1963    Years since quitting: 60.6   Smokeless tobacco: Never  Vaping Use   Vaping Use: Never used  Substance and Sexual Activity   Alcohol use: Not Currently   Drug use: Never   Sexual activity: Not on file  Other Topics Concern   Not on file  Social History Narrative   Not on file   Social Determinants of Health   Financial Resource Strain: Low Risk  (07/07/2022)   Overall Financial Resource Strain (CARDIA)    Difficulty of Paying Living Expenses: Not hard at all  Food Insecurity: No Food Insecurity (07/07/2022)   Hunger Vital Sign    Worried About Running Out of Food in the Last Year: Never true    [place] in the Last Year: Never true  Transportation Needs: No Transportation Needs (07/07/2022)   PRAPARE -  Hydrologist (Medical): No    Lack of Transportation (Non-Medical): No  Physical Activity: Insufficiently Active (07/07/2022)   Exercise Vital Sign    Days of Exercise per Week: 3 days    Minutes of Exercise per Session:  30 min  Stress: No Stress Concern Present (07/07/2022)   [employer] of Occupational Health - Occupational Stress Questionnaire    Feeling of Stress : Not at all  Social Connections: Moderately Isolated (07/07/2022)   Social Connection and Isolation Panel [NHANES]    Frequency of Communication with Friends and Family: More than three times a week    Frequency of Social Gatherings with Friends and Family: More than three times a week    Attends Religious Services: Never    Database administrator or Organizations: No    Attends Engineer, structural: Never    Marital Status: Married    Tobacco Counseling Counseling given: Not Answered   Clinical Intake:  Pre-visit preparation completed: Yes  Pain : No/denies pain     BMI - recorded: 26.34 Nutritional Status: BMI 25 -29 Overweight Nutritional Risks: None Diabetes: Yes CBG done?: No Did pt. bring in CBG monitor from home?: No (phone visit)  How often do you need to have someone help you when you read instructions, pamphlets, or other written materials from your doctor or pharmacy?: 1 - Never  Diabetes:  Is the patient diabetic?  Yes  If diabetic, was a CBG obtained today?  No  Did the patient bring in their glucometer from home?  No phone visit How often do you monitor your CBG's? 3 x per week.   Financial Strains and Diabetes Management:  Are you having any financial strains with the device, your supplies or your medication? No .  Does the patient want to be seen by Chronic Care Management for management of their diabetes?  No  Would the patient like to be referred to a Nutritionist or for Diabetic Management?  No   Diabetic Exams:  Diabetic Eye Exam:  Completed 09/21/2021.   Diabetic Foot Exam: Completed 03/21/2022.   Interpreter Needed?: No  Information entered by :: Thomasenia Sales LPN   Activities of Daily Living    07/07/2022    9:56 AM  In your present state of health, do you have any difficulty performing the following activities:  Hearing? 0  Vision? 0  Difficulty concentrating or making decisions? 0  Walking or climbing stairs? 1  Comment stairs  Dressing or bathing? 0  Doing errands, shopping? 1  Preparing Food and eating ? N  Using the Toilet? N  In the past six months, have you accidently leaked urine? N  Do you have problems with loss of bowel control? N  Managing your Medications? N  Managing your Finances? N  Housekeeping or managing your Housekeeping? N    Patient Care Team: Sharlene Dory, DO as PCP - General (Family Medicine) Thomasene Ripple, DO as PCP - Cardiology (Cardiology)  Indicate any recent Medical Services you may have received from other than [facility] providers in the past year (date may be approximate).     Assessment:   This is a routine wellness examination for Saylee.  Hearing/Vision screen Hearing Screening - Comments:: No issues Vision Screening - Comments:: Last eye exam-09/2021-[facility]  Dietary issues and exercise activities discussed: Current Exercise Habits: Structured exercise class, Type of exercise: strength training/weights;stretching, Time (Minutes): 30, Frequency (Times/Week): 3, Weekly Exercise (Minutes/Week): 90, Intensity: Mild, Exercise limited by: orthopedic condition(s)   Goals Addressed             This Visit's Progress    Patient Stated       Maintain current       Depression Screen  07/07/2022    9:56 AM 08/02/2021    8:38 AM 05/22/2021    8:27 AM 10/24/2020    8:13 AM  PHQ 2/9 Scores  PHQ - 2 Score 0 0 0 0    Fall Risk    07/07/2022    9:54 AM 08/02/2021    8:38 AM 05/22/2021    8:27 AM  Fall Risk   Falls in the past year? 1 0 1   Number falls in past yr: 1 0 0  Injury with Fall? 1 0 1  Risk for fall due to : History of fall(s) No Fall Risks No Fall Risks  Follow up Falls prevention discussed Falls evaluation completed Falls evaluation completed    [place]:  Any stairs in or around the home? No  Home free of loose throw rugs in walkways, pet beds, electrical cords, etc? Yes  Adequate lighting in your home to reduce risk of falls? Yes   ASSISTIVE DEVICES UTILIZED TO PREVENT FALLS:  Life alert? No  Use of a cane, walker or w/c? Yes  Grab bars in the bathroom? Yes  Shower chair or bench in shower? No  Elevated toilet seat or a handicapped toilet? Yes   TIMED UP AND GO:  Was the test performed? No . Phone visit   Cognitive Function:Normal cognitive status assessed by this Nurse Health Advisor. No abnormalities found.         07/07/2022   10:02 AM  6CIT Screen  What Year? 0 points  What month? 0 points  What time? 0 points  Count back from 20 0 points  Months in reverse 0 points  Repeat phrase 2 points  Total Score 2 points    Immunizations Immunization History  Administered Date(s) Administered   Fluad Quad(high Dose 65+) 09/04/2021   Influenza, High Dose Seasonal PF 08/26/2018, 10/03/2020   Influenza-Unspecified 08/17/2017, 09/16/2019   Moderna Sars-Covid-2 Vaccination 01/27/2020, 02/24/2020   PNEUMOCOCCAL CONJUGATE-20 09/04/2021   Pneumococcal Polysaccharide-23 08/26/2018   Td 01/15/2013   Tdap 02/06/2022    TDAP status: Up to date  Flu Vaccine status: Up to date  Pneumococcal vaccine status: Up to date  Covid-19 vaccine status: Information provided on how to obtain vaccines.   Qualifies for Shingles Vaccine? Yes   Zostavax completed No   Shingrix Completed?: No.    Education has been provided regarding the importance of this vaccine. Patient has been advised to call insurance company to determine out of pocket expense if they have not yet  received this vaccine. Advised may also receive vaccine at local pharmacy or Health Dept. Verbalized acceptance and understanding.  Screening Tests Health Maintenance  Topic Date Due   INFLUENZA VACCINE  06/17/2022   COVID-19 Vaccine (3 - Moderna risk series) 09/21/2022 (Originally 03/23/2020)   HEMOGLOBIN A1C  09/21/2022   OPHTHALMOLOGY EXAM  09/21/2022   FOOT EXAM  03/22/2023   TETANUS/TDAP  02/07/2032   Pneumonia Vaccine 32+ Years old  Completed   DEXA SCAN  Completed   HPV VACCINES  Aged Out   Zoster Vaccines- Shingrix  Discontinued    Health Maintenance  Health Maintenance Due  Topic Date Due   INFLUENZA VACCINE  06/17/2022    Colorectal cancer screening: No longer required.   Mammogram status: Declined  Bone Density status: Declined  Lung Cancer Screening: (Low Dose CT Chest recommended if Age 32-80 years, 30 pack-year currently smoking OR have quit w/in 15years.) does not qualify.    Additional Screening:  Hepatitis C Screening: does not qualify  Vision Screening: Recommended annual ophthalmology exams for early detection of glaucoma and other disorders of the eye. Is the patient up to date with their annual eye exam?  Yes  Who is the provider or what is the name of the office in which the patient attends annual eye exams? [facility]   Dental Screening: Recommended annual dental exams for proper oral hygiene  Community Resource Referral / Chronic Care Management: CRR required this visit?  No   CCM required this visit?  No      Plan:     I have personally reviewed and noted the following in the patient's chart:   Medical and social history Use of alcohol, tobacco or illicit drugs  Current medications and supplements including opioid prescriptions. Patient is not currently taking opioid prescriptions. Functional ability and status Nutritional status Physical activity Advanced directives List of other physicians Hospitalizations, surgeries, and ER  visits in previous 12 months Vitals Screenings to include cognitive, depression, and falls Referrals and appointments  In addition, I have reviewed and discussed with patient certain preventive protocols, quality metrics, and best practice recommendations. A written personalized care plan for preventive services as well as general preventive health recommendations were provided to patient.   Due to this being a telephonic visit, the after visit summary with patients personalized plan was offered to patient via mail or my-chart. Patient would like to access on my-chart.  Roanna Raider, LPN   5/36/1443  Nurse Health Advisor  Nurse Notes: None

## 2022-07-23 ENCOUNTER — Other Ambulatory Visit: Payer: Self-pay | Admitting: Family Medicine

## 2022-07-23 DIAGNOSIS — G5603 Carpal tunnel syndrome, bilateral upper limbs: Secondary | ICD-10-CM

## 2022-07-23 DIAGNOSIS — K219 Gastro-esophageal reflux disease without esophagitis: Secondary | ICD-10-CM

## 2022-08-27 ENCOUNTER — Telehealth: Payer: Self-pay | Admitting: Family Medicine

## 2022-08-27 MED ORDER — CLOPIDOGREL BISULFATE 75 MG PO TABS: ORAL_TABLET | ORAL | 3 refills | Status: DC

## 2022-08-27 NOTE — Telephone Encounter (Signed)
Pt stated there were no refills remaining when they looked.     Medication: clopidogrel (PLAVIX) 75 MG tablet  Has the patient contacted their pharmacy? No.   Preferred Pharmacy:  [facility][id number] [address], [address] AT [facility] OF [place] [place] [address] [place] [address] Phone: [phone]  Fax: [phone] DEA #

## 2022-08-27 NOTE — Telephone Encounter (Signed)
Refill done and patient made aware 

## 2022-09-01 ENCOUNTER — Telehealth: Payer: Self-pay | Admitting: Family Medicine

## 2022-09-01 ENCOUNTER — Encounter: Payer: Self-pay | Admitting: Family Medicine

## 2022-09-01 ENCOUNTER — Telehealth (INDEPENDENT_AMBULATORY_CARE_PROVIDER_SITE_OTHER): Payer: Medicare Other | Admitting: Family Medicine

## 2022-09-01 DIAGNOSIS — N3000 Acute cystitis without hematuria: Secondary | ICD-10-CM

## 2022-09-01 MED ORDER — NITROFURANTOIN MONOHYD MACRO 100 MG PO CAPS: 100.0000 mg | ORAL_CAPSULE | Freq: Two times a day (BID) | ORAL | 0 refills | Status: AC

## 2022-09-01 NOTE — Progress Notes (Signed)
Chief Complaint  Patient presents with   Urinary Tract Infection    Margaret Ward is a 81 y.o. female here for possible UTI. Due to COVID-19 pandemic, we are interacting via telephone. I verified patient's ID using 2 identifiers. Patient agreed to proceed with visit via this method. Patient is at home, I am at office. Patient and I are present for visit.   Duration: 3 days. Symptoms: Dysuria, urinary frequency and urgency Denies: hematuria, urinary hesitancy, urinary retention, fever, nausea, vomiting, flank pain, vaginal discharge Hx of recurrent UTI? No Denies new sexual partners.  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 s/p CABG in 01/2018. b. Recurrent CP 02/2018 -> cath showing early graft closure SVG-diagonal, received DES to D1.   CAD/s/p CABG Mar 2019 w/ DES to vein graft Apr 2019 05/21/2018   Cameron ulcer, acute 05/27/2018   Carpal tunnel syndrome, bilateral 12/28/2018   Chest pain 02/05/2018   Chronic combined systolic and diastolic CHF (congestive heart failure) ([place])    a. LVEF 40-45% by echo 02/2018.   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   Chronic hypoxemic respiratory failure ([place]) 05/21/2018   Chronic lower back pain    CKD (chronic kidney disease), stage III (HCC)    Diabetes mellitus type 2 in obese ([place]) 04/23/2018   Falls frequently 09/04/2021   Gastric ulcer, unspecified as acute or chronic, without hemorrhage or perforation 05/27/2018    GERD (gastroesophageal reflux disease)    Glass eye    right eye   Gout    "on daily RX" (03/02/2018)   Heme positive stool 05/21/2018   History of anemia 02/05/2021   History of cholecystectomy 02/05/2021   History of kidney stones    HLD (hyperlipidemia) 05/21/2018   Hyperlipidemia    Hypertension    Hypertensive heart disease    Hypertensive heart disease with heart failure ([place]) 03/03/2018   Kidney cysts    Kidney disease    Left ventricular aneurysm    Migraine    "used to get them twice/week; haven't had them in a long time" (03/02/2018)   NSTEMI (non-ST elevated myocardial infarction) ([place]) 03/01/2018   Obesity (BMI 30-39.9) 07/25/2020   On home oxygen therapy    "3L at night and prn during the day" (03/02/2018)   Physical deconditioning 10/24/2020   Pneumonia    used to get it twice/year; stopped after I had hysterectomy" (03/02/2018)   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  Stay hydrated. Seek immediate care if  pt starts to develop fevers, new/worsening symptoms, uncontrollable N/V. F/u prn. Total time: 8 min The patient voiced understanding and agreement to the plan.  [place], DO 09/01/22 12:45 PM

## 2022-09-01 NOTE — Telephone Encounter (Signed)
Pt stated she has a UTI and would like something sent in. She declined an appt as she has no one to bring her. Please advise.    [facility] #[id number] Starling [address]s, [address] AT [facility] OF [address] [place] [address] Phone: [phone]  Fax: [phone]

## 2022-09-10 ENCOUNTER — Other Ambulatory Visit: Payer: Self-pay | Admitting: Cardiology

## 2022-09-16 ENCOUNTER — Encounter: Payer: Self-pay | Admitting: *Deleted

## 2022-09-16 ENCOUNTER — Telehealth: Payer: Self-pay | Admitting: *Deleted

## 2022-09-16 NOTE — Patient Instructions (Signed)
Visit Information  Thank you for taking time to visit with me today. Please don't hesitate to contact me if I can be of assistance to you.   Following are the goals we discussed today:   Goals Addressed             This Visit's Progress    COMPLETED: Care Coordination Activities: No follow up required   On track    Care Coordination Interventions: Evaluation of current treatment plan related to DMII/ general health maintenance and patient's adherence to plan as established by provider Advised patient to provide appropriate vaccination information to provider or CM team member at next visit Advised patient to continue monitoring fasting blood sugars at home three times per week; reviewed with patient recent blood sugars at home- she reports fasting blood sugars generally between 80-90 and denies signs/ symptoms hypoglycemia; confirms she has not been checking blood pressures at home Provided education to patient re: signs/ symptoms low blood sugar with corresponding action plan Reviewed scheduled/upcoming provider appointments including 09/24/22- PCP Advised patient, providing education and rationale, to check cbg fasting, three times per week and record, calling PCP for findings outside established parameters  Advised patient to discuss ongoing gradual weight loss with provider Screening for signs and symptoms of depression related to chronic disease state  Assessed social determinant of health barriers Confirmed patient attended Medicare Annual Wellness visit on 07/07/22; discussed with patient need to obtain flu vaccine for 2023-24 flu/ winter season; encouraged patient to obtain at upcoming scheduled PCP office visit; confirmed no recent changes or concerns around medications- patient continues to self manage and endorses 100% adherence: positive reinforcement provided           If you are experiencing a Mental Health or [place] or need someone to talk to, please   call the Suicide and Crisis Lifeline: 988 call the [place] National Suicide Prevention Lifeline: (270) 249-9621 or TTY: 315-038-4166 TTY 913-673-3122) to talk to a trained counselor call 1-800-273-TALK (toll free, 24 hour hotline) go to [facility] [address] [place] [phone]) call the [place]: [phone] call 911   Patient verbalizes understanding of instructions and care plan provided today and agrees to view in [place]. Active MyChart status and patient understanding of how to access instructions and care plan via MyChart confirmed with patient.     No further follow up required: no further or ongoing care coordination needs identified today  Oneta Rack, RN, BSN, CCRN Alumnus RN CM Care Coordination/ Transition of [place] [facility] [phone]: direct office

## 2022-09-16 NOTE — Patient Outreach (Signed)
  Care Coordination   Initial Visit Note   09/16/2022 Name: [name] MRN: [id number] DOB: 1941-06-09  Margaret Ward is a 81 y.o. year old female who sees Nani Ravens, [place], DO for primary care. I spoke with  Margaret Ward by phone today.  What matters to the patients health and wellness today?  "I am doing fine; I took the antibiotics that Dr. Nani Ravens gave me back in October and I am doing fine now, feel normal, and feel good.  I am still losing weight gradually, but I don't think it has been a lot of weight.  I am staying hydrated and taking my medicines like I am supposed to.  My blood sugars are running great-- they are always between 80-90; I don't ever go lower than 80.  I don't have any concerns around my healthcare at the moment; I am planning to get my flu vaccine when I see Dr. Nani Ravens in a couple of weeks."  Interventions provided; no further or ongoing care coordination needs identified today   Goals Addressed             This Visit's Progress    COMPLETED: Care Coordination Activities: No follow up required   On track    Care Coordination Interventions: Evaluation of current treatment plan related to DMII/ general health maintenance and patient's adherence to plan as established by provider Advised patient to provide appropriate vaccination information to provider or CM team member at next visit Advised patient to continue monitoring fasting blood sugars at home three times per week; reviewed with patient recent blood sugars at home- she reports fasting blood sugars generally between 80-90 and denies signs/ symptoms hypoglycemia; confirms she has not been checking blood pressures at home Provided education to patient re: signs/ symptoms low blood sugar with corresponding action plan Reviewed scheduled/upcoming provider appointments including 09/24/22- PCP Advised patient, providing education and rationale, to check cbg fasting, three times per week  and record, calling PCP for findings outside established parameters  Advised patient to discuss ongoing gradual weight loss with provider Screening for signs and symptoms of depression related to chronic disease state  Assessed social determinant of health barriers Confirmed patient attended Medicare Annual Wellness visit on 07/07/22; discussed with patient need to obtain flu vaccine for 2023-24 flu/ winter season; encouraged patient to obtain at upcoming scheduled PCP office visit; confirmed no recent changes or concerns around medications- patient continues to self manage and endorses 100% adherence: positive reinforcement provided           SDOH assessments and interventions completed:  Yes  SDOH Interventions Today    Flowsheet Row Most Recent Value  SDOH Interventions   Food Insecurity Interventions Intervention Not Indicated  Transportation Interventions Intervention Not Indicated  [daughter provides transportation]       Care Coordination Interventions Activated:  Yes  Care Coordination Interventions:  Yes, provided   Follow up plan: No further intervention required.   Encounter Outcome:  Pt. Visit Completed   Oneta Rack, RN, BSN, CCRN Alumnus RN CM Care Coordination/ Transition of [place] [facility] [phone]: direct office

## 2022-09-17 ENCOUNTER — Other Ambulatory Visit: Payer: Self-pay | Admitting: Family Medicine

## 2022-09-17 ENCOUNTER — Other Ambulatory Visit: Payer: Self-pay | Admitting: Cardiology

## 2022-09-17 DIAGNOSIS — I110 Hypertensive heart disease with heart failure: Secondary | ICD-10-CM

## 2022-09-17 DIAGNOSIS — I5032 Chronic diastolic (congestive) heart failure: Secondary | ICD-10-CM

## 2022-09-24 ENCOUNTER — Other Ambulatory Visit: Payer: Self-pay | Admitting: Family Medicine

## 2022-09-24 ENCOUNTER — Telehealth: Payer: Self-pay

## 2022-09-24 ENCOUNTER — Encounter: Payer: Self-pay | Admitting: Family Medicine

## 2022-09-24 ENCOUNTER — Ambulatory Visit (INDEPENDENT_AMBULATORY_CARE_PROVIDER_SITE_OTHER): Payer: Medicare Other | Admitting: Family Medicine

## 2022-09-24 VITALS — BP 120/68 | HR 72 | Temp 97.9°F | Ht 62.0 in | Wt 144.1 lb

## 2022-09-24 DIAGNOSIS — E1139 Type 2 diabetes mellitus with other diabetic ophthalmic complication: Secondary | ICD-10-CM

## 2022-09-24 DIAGNOSIS — Z23 Encounter for immunization: Secondary | ICD-10-CM

## 2022-09-24 DIAGNOSIS — G629 Polyneuropathy, unspecified: Secondary | ICD-10-CM | POA: Diagnosis not present

## 2022-09-24 DIAGNOSIS — M1A9XX0 Chronic gout, unspecified, without tophus (tophi): Secondary | ICD-10-CM | POA: Diagnosis not present

## 2022-09-24 LAB — COMPREHENSIVE METABOLIC PANEL
ALT: 15 U/L (ref 0–35)
AST: 15 U/L (ref 0–37)
Albumin: 3.9 g/dL (ref 3.5–5.2)
Alkaline Phosphatase: 79 U/L (ref 39–117)
BUN: 38 mg/dL — ABNORMAL HIGH (ref 6–23)
CO2: 24 mEq/L (ref 19–32)
Calcium: 9.1 mg/dL (ref 8.4–10.5)
Chloride: 107 mEq/L (ref 96–112)
Creatinine, Ser: 1.52 mg/dL — ABNORMAL HIGH (ref 0.40–1.20)
GFR: 31.89 mL/min — ABNORMAL LOW (ref >60.00)
Glucose, Bld: 137 mg/dL — ABNORMAL HIGH (ref 70–99)
Potassium: 4.5 mEq/L (ref 3.5–5.1)
Sodium: 140 mEq/L (ref 135–145)
Total Bilirubin: 0.6 mg/dL (ref 0.2–1.2)
Total Protein: 6.6 g/dL (ref 6.0–8.3)

## 2022-09-24 LAB — LIPID PANEL
Cholesterol: 99 mg/dL (ref 0–200)
HDL: 58.6 mg/dL (ref >39.00)
LDL Cholesterol: 21 mg/dL (ref 0–99)
NonHDL: 39.94
Total CHOL/HDL Ratio: 2
Triglycerides: 97 mg/dL (ref 0.0–149.0)
VLDL: 19.4 mg/dL (ref 0.0–40.0)

## 2022-09-24 LAB — MICROALBUMIN / CREATININE URINE RATIO
Creatinine,U: 67.7 mg/dL
Microalb Creat Ratio: 50.1 mg/g — ABNORMAL HIGH (ref 0.0–30.0)
Microalb, Ur: 33.9 mg/dL — ABNORMAL HIGH (ref 0.0–1.9)

## 2022-09-24 LAB — HEMOGLOBIN A1C: Hgb A1c MFr Bld: 7.4 % — ABNORMAL HIGH (ref 4.6–6.5)

## 2022-09-24 LAB — URIC ACID: Uric Acid, Serum: 5.1 mg/dL (ref 2.4–7.0)

## 2022-09-24 MED ORDER — NORTRIPTYLINE HCL 10 MG PO CAPS: 10.0000 mg | ORAL_CAPSULE | Freq: Every day | ORAL | 1 refills | Status: DC

## 2022-09-24 MED ORDER — VENLAFAXINE HCL ER 37.5 MG PO CP24: 37.5000 mg | ORAL_CAPSULE | Freq: Every day | ORAL | 2 refills | Status: DC

## 2022-09-24 NOTE — Telephone Encounter (Signed)
Nortriptyline not covered by [employer]. Preferred alternatives: Lyrica, venlafaxine

## 2022-09-24 NOTE — Patient Instructions (Addendum)
Give [place] 2-3 business days to get the results of your labs back.   Keep the diet clean and stay active.  If we don't improve, please let me know.   Let's keep an eye on the ganglion cyst, we can refer you to a hand specialist.   Let us know if you need anything.

## 2022-09-24 NOTE — Progress Notes (Signed)
Subjective:   Chief Complaint  Patient presents with   Follow-up    Margaret Ward is a 81 y.o. female here for follow-up of diabetes.   Margaret Ward's self monitored glucose range is 80-90's.  Patient denies hypoglycemic reactions. She checks her glucose levels 3 time(s) per day. Patient does not require insulin.   Medications include: Jardiance 25 mg/d, metformin XR 1000 mg/d Diet is healthy.  Exercise: some walking No new CP or SOB.  Having neuropathy. Cymbalta not helpful.   Gout Takes allopurinol 100 mg/d. Last flare was many years ago. Compliant, no AE's. Usually hits her wrist.   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 s/p CABG in 01/2018. b. Recurrent CP 02/2018 -> cath showing early graft closure SVG-diagonal, received DES to D1.   CAD/s/p CABG Mar 2019 w/ DES to vein graft Apr 2019 05/21/2018   Cameron ulcer, acute 05/27/2018   Carpal tunnel syndrome, bilateral 12/28/2018   Chest pain 02/05/2018   Chronic combined systolic and diastolic CHF (congestive heart failure) (HCC)    a. LVEF 40-45% by echo 02/2018.   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   Chronic hypoxemic respiratory failure (HCC) 05/21/2018   Chronic lower back pain    CKD (chronic kidney disease), stage III (HCC)    Diabetes mellitus type 2 in obese (HCC) 04/23/2018   Falls frequently 09/04/2021   Gastric ulcer, unspecified as acute or chronic, without hemorrhage or perforation  05/27/2018   GERD (gastroesophageal reflux disease)    Glass eye    right eye   Gout    "on daily RX" (03/02/2018)   Heme positive stool 05/21/2018   History of anemia 02/05/2021   History of cholecystectomy 02/05/2021   History of kidney stones    HLD (hyperlipidemia) 05/21/2018   Hyperlipidemia    Hypertension    Hypertensive heart disease    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   Kidney cysts    Kidney disease    Left ventricular aneurysm    Migraine    "used to get them twice/week; haven't had them in a long time" (03/02/2018)   NSTEMI (non-ST elevated myocardial infarction) (HCC) 03/01/2018   Obesity (BMI 30-39.9) 07/25/2020   On home oxygen therapy    "3L at night and prn during the day" (03/02/2018)   Physical deconditioning 10/24/2020   Pneumonia    used to get it twice/year; stopped after I had hysterectomy" (03/02/2018)   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   Senile purpura (HCC) 09/04/2021   Sinus tachycardia 03/09/2018   Sleep apnea    SOB (shortness of breath) 03/09/2018   Symptomatic anemia 05/21/2018   Type II diabetes mellitus (HCC)      Related testing: Retinal exam: Done Pneumovax: done  Objective:  BP 120/68 (BP Location: Left Arm, Patient Position: Sitting, Cuff Size: Normal)   Pulse 72   Temp 97.9 F (36.6 C) (Oral)  Ht 5\' 2"  (1.575 m)   Wt 144 lb 2 oz (65.4 kg)   SpO2 98%   BMI 26.36 kg/m  General:  Well developed, well nourished, in no apparent distress Lungs:  CTAB, no access msc use Cardio:  RRR, no bruits, no LE edema Psych: Age appropriate judgment and insight  Assessment:   Type 2 diabetes mellitus with other ophthalmic complication, without long-term current use of insulin (HCC) - Plan: Comprehensive metabolic panel, Lipid panel, Hemoglobin A1c, Microalbumin / creatinine urine ratio  Chronic gout without tophus, unspecified cause, unspecified site - Plan: Uric acid  Neuropathy - Plan: DISCONTINUED: nortriptyline (PAMELOR) 10 MG  capsule  Need for influenza vaccination - Plan: Flu Vaccine QUAD High Dose(Fluad)   Plan:   Chronic, stable. Cont Jardiance 25 mg/d, metformin XR 1000 mg/d. Counseled on diet and exercise. Flu shot today.  Chronic, stable. Cont allopurinol 100 mg/d.  Stop Cymbalta, start Effexor. Would consider Lyrica if no better.  F/u in 6 mo. The patient voiced understanding and agreement to the plan.  [place], DO 09/24/22 11:02 AM

## 2022-09-25 ENCOUNTER — Telehealth: Payer: Self-pay | Admitting: Family Medicine

## 2022-09-25 NOTE — Telephone Encounter (Signed)
Patient called to advise that she needs to speak with Robin regarding the new prescription Dr. Carmelia Roller prescribed for her neuropathy. She said that her insurance is giving her a hard time but she wasn't sure of the name of the medication and didn't elaborate on what insurance was requesting. Please call patient to discuss

## 2022-09-26 NOTE — Telephone Encounter (Signed)
Left message for pt to return my call regarding below message.

## 2022-09-26 NOTE — Telephone Encounter (Signed)
Pt returned my call and was advised that the venlafaxine XR replaces the nortriptyline and she should not take both.  Pt states she will remove the nortriptyline and continue the Venlafaxine XR that she has picked up.

## 2022-09-29 ENCOUNTER — Telehealth: Payer: Self-pay | Admitting: *Deleted

## 2022-09-29 NOTE — Telephone Encounter (Signed)
Who Is Calling Patient / Member / Family / Caregiver Call Type Triage / Clinical Caller Name Margaret Ward Relationship To Patient Daughter Return Phone Number [phone] (Primary) Chief Complaint BREATHING - shortness of breath or sounds breathless Reason for Call Symptomatic / Request for Health Information Initial Comment Caller states her mother fell today. She injured her back and is now hard to take a deep breath in. Translation No Nurse Assessment Nurse: Tomie China, RN, Lurena Joiner Date/Time (Eastern Time): 09/28/2022 3:46:49 PM Confirm and document reason for call. If symptomatic, describe symptoms. ---Caller states her mother fell today. She injured her back and is now hard to take a deep breath in. ground level fall and hit her right and back . event happened 30 mins ago  Disp. Time Lamount Cohen Time) Disposition Final User 09/28/2022 3:55:24 PM Go to ED Now (or PCP triage) Yes Tomie China, RN, Lurena Joiner Final Disposition 09/28/2022 3:55:24 PM Go to ED Now (or PCP triage) Yes Tomie China, RN, Cecil Cobbs Disagree/Comply Comply Caller Understands Yes PreDisposition InappropriateToAsk Care Advice Given Per Guideline GO TO ED NOW (OR PCP TRIAGE): * IF PCP SECOND-LEVEL TRIAGE REQUIRED: You may need to be seen. Your doctor (or NP/PA) will want to talk with you to decide what's best. I'll page the provider on-call now. If you haven't heard from the provider (or me) within 30 minutes, go directly to the ED/UCC at _____________ Hospital. CARE ADVICE given per Chest Injury (Adult) guideline. Referrals [facility] - ED

## 2022-09-29 NOTE — Telephone Encounter (Signed)
Called left message to call back 

## 2022-09-29 NOTE — Telephone Encounter (Signed)
Called and she did refuse going to the hospital. Preferred to see PCP. Scheduled for her to see PCP 11/14 at 11:45 AM Patient agreed in the meantime if she has any change/increase of symptoms worsening she will go to the ER.  Patient currently scheduled to see PCP.

## 2022-09-29 NOTE — Telephone Encounter (Signed)
Looks like patient did not report to ER

## 2022-09-30 ENCOUNTER — Ambulatory Visit (INDEPENDENT_AMBULATORY_CARE_PROVIDER_SITE_OTHER): Payer: Medicare Other | Admitting: Family Medicine

## 2022-09-30 ENCOUNTER — Ambulatory Visit (HOSPITAL_BASED_OUTPATIENT_CLINIC_OR_DEPARTMENT_OTHER)
Admission: RE | Admit: 2022-09-30 | Discharge: 2022-09-30 | Disposition: A | Discharge status: Home / Self Care | Payer: Medicare Other | Source: Ambulatory Visit | Attending: Family Medicine | Admitting: Family Medicine

## 2022-09-30 ENCOUNTER — Encounter: Payer: Self-pay | Admitting: Family Medicine

## 2022-09-30 VITALS — BP 110/72 | HR 76 | Temp 98.2°F | Ht 62.0 in | Wt 144.2 lb

## 2022-09-30 DIAGNOSIS — K449 Diaphragmatic hernia without obstruction or gangrene: Secondary | ICD-10-CM | POA: Diagnosis not present

## 2022-09-30 DIAGNOSIS — R0789 Other chest pain: Secondary | ICD-10-CM | POA: Diagnosis not present

## 2022-09-30 DIAGNOSIS — Z9889 Other specified postprocedural states: Secondary | ICD-10-CM | POA: Diagnosis not present

## 2022-09-30 NOTE — Patient Instructions (Addendum)
Ice/cold pack over area for 10-15 min twice daily.  OK to take Tylenol 1000 mg (2 extra strength tabs) or 975 mg (3 regular strength tabs) every 6 hours as needed.  Use Salonpas and/or Voltaren gel also.   We will be in touch regarding your X-ray results.   Blow up a balloon once per hour while awake.   Let us know if you need anything.

## 2022-09-30 NOTE — Progress Notes (Signed)
Musculoskeletal Exam  Patient: Margaret Ward DOB: [birth year]  DOS: 09/30/2022  SUBJECTIVE:  Chief Complaint:   Chief Complaint  Patient presents with   Margaret Ward is a 81 y.o.  female for evaluation and treatment of R mid side/back pain. She is here w her daughter.   Onset:  2 days ago. Fell while watering plants.  Location: R lower back/side Character:  aching  Progression of issue:  has slightly improved Associated symptoms: bruising, slight swelling No redness or SOB.  Treatment: to date has been acetaminophen.   Neurovascular symptoms: no  Past Medical History:  Diagnosis Date   Anemia    Anemia due to chronic blood loss 02/05/2021   Arthritis    "hands, arms, all over the place" (03/02/2018)   Bilateral carpal tunnel syndrome 10/11/2019   Bronchospasm 03/28/2018   CAD (coronary artery disease) 02/08/2018   Left heart cath 01/13/18: 40% distal LMCAS 90% proximal LAD  LCF with 90% proximal ramus stenosis, diffuse RCA, 60% distal and 90% proximal PDA stenosis   CAD in native artery    a. s/p CABG in 01/2018. b. Recurrent CP 02/2018 -> cath showing early graft closure SVG-diagonal, received DES to D1.   CAD/s/p CABG Mar 2019 w/ DES to vein graft Apr 2019 05/21/2018   Cameron ulcer, acute 05/27/2018   Carpal tunnel syndrome, bilateral 12/28/2018   Chest pain 02/05/2018   Chronic combined systolic and diastolic CHF (congestive heart failure) (HCC)    a. LVEF 40-45% by echo 02/2018.   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   Chronic hypoxemic respiratory failure (HCC) 05/21/2018   Chronic lower back pain    CKD (chronic kidney disease), stage III (HCC)    Diabetes mellitus type 2 in obese (HCC) 04/23/2018   Falls frequently 09/04/2021   Gastric ulcer, unspecified as acute or chronic, without hemorrhage or perforation 05/27/2018   GERD  (gastroesophageal reflux disease)    Glass eye    right eye   Gout    "on daily RX" (03/02/2018)   Heme positive stool 05/21/2018   History of anemia 02/05/2021   History of cholecystectomy 02/05/2021   History of kidney stones    HLD (hyperlipidemia) 05/21/2018   Hyperlipidemia    Hypertension    Hypertensive heart disease    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   Kidney cysts    Kidney disease    Left ventricular aneurysm    Migraine    "used to get them twice/week; haven't had them in a long time" (03/02/2018)   NSTEMI (non-ST elevated myocardial infarction) (HCC) 03/01/2018   Obesity (BMI 30-39.9) 07/25/2020   On home oxygen therapy    "3L at night and prn during the day" (03/02/2018)   Physical deconditioning 10/24/2020   Pneumonia    used to get it twice/year; stopped after I had hysterectomy" (03/02/2018)   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 No acute distress. Thorax & Lungs: No accessory muscle use Musculoskeletal: R rib cage.   Tenderness to palpation: Yes over the lateral rib cage around R5 through 9, also over the posterior rib cage on the right are 5 through 9.  There is no crepitus. Deformity: no Ecchymosis: yes Neurologic: Normal sensory function.  Gait is slow and cautious. Psychiatric: Normal mood. Age appropriate judgment and insight. Alert & oriented x 3.    Assessment:  Chest wall pain - Plan: DG Ribs Unilateral W/Chest Right  Plan: Check x-ray, heat, ice, Tylenol topical lidocaine, topical  anti-inflammatories recommended.  She was also told to blow up a balloon once per hour while awake. F/u as needed or as originally scheduled. The patient and her daughter voiced understanding and agreement to the plan.   Jilda Roche [place], DO 09/30/22  12:18 PM

## 2022-10-01 ENCOUNTER — Other Ambulatory Visit: Payer: Self-pay | Admitting: Family Medicine

## 2022-10-06 ENCOUNTER — Encounter: Payer: Self-pay | Admitting: Family Medicine

## 2022-10-07 ENCOUNTER — Other Ambulatory Visit: Payer: Self-pay

## 2022-10-07 DIAGNOSIS — I2510 Atherosclerotic heart disease of native coronary artery without angina pectoris: Secondary | ICD-10-CM | POA: Insufficient documentation

## 2022-10-08 ENCOUNTER — Ambulatory Visit: Payer: Medicare Other | Attending: Cardiology | Admitting: Cardiology

## 2022-10-08 ENCOUNTER — Encounter: Payer: Self-pay | Admitting: Cardiology

## 2022-10-08 VITALS — BP 90/54 | HR 87 | Ht 62.0 in | Wt 143.1 lb

## 2022-10-08 DIAGNOSIS — E669 Obesity, unspecified: Secondary | ICD-10-CM | POA: Insufficient documentation

## 2022-10-08 DIAGNOSIS — I5032 Chronic diastolic (congestive) heart failure: Secondary | ICD-10-CM | POA: Diagnosis not present

## 2022-10-08 DIAGNOSIS — Z951 Presence of aortocoronary bypass graft: Secondary | ICD-10-CM | POA: Diagnosis not present

## 2022-10-08 DIAGNOSIS — I519 Heart disease, unspecified: Secondary | ICD-10-CM | POA: Insufficient documentation

## 2022-10-08 DIAGNOSIS — E785 Hyperlipidemia, unspecified: Secondary | ICD-10-CM | POA: Insufficient documentation

## 2022-10-08 DIAGNOSIS — I110 Hypertensive heart disease with heart failure: Secondary | ICD-10-CM | POA: Insufficient documentation

## 2022-10-08 DIAGNOSIS — Z8679 Personal history of other diseases of the circulatory system: Secondary | ICD-10-CM | POA: Diagnosis not present

## 2022-10-08 DIAGNOSIS — E1169 Type 2 diabetes mellitus with other specified complication: Secondary | ICD-10-CM | POA: Diagnosis not present

## 2022-10-08 DIAGNOSIS — E782 Mixed hyperlipidemia: Secondary | ICD-10-CM | POA: Diagnosis not present

## 2022-10-08 HISTORY — DX: Personal history of other diseases of the circulatory system: Z86.79

## 2022-10-08 HISTORY — DX: Mixed hyperlipidemia: E78.2

## 2022-10-08 MED ORDER — ATORVASTATIN CALCIUM 20 MG PO TABS: 20.0000 mg | ORAL_TABLET | Freq: Every day | ORAL | 3 refills | Status: DC

## 2022-10-08 MED ORDER — FUROSEMIDE 20 MG PO TABS: ORAL_TABLET | ORAL | 3 refills | Status: DC

## 2022-10-08 NOTE — Patient Instructions (Signed)
Medication Instructions:  Your physician has recommended you make the following change in your medication:   Decrease your Atorvastatin 20 mg daily.  Decrease your Furosemide to 20 mg daily.  *If you need a refill on your cardiac medications before your next appointment, please call your pharmacy*   Lab Work: None ordered If you have labs (blood work) drawn today and your tests are completely normal, you will receive your results only by: MyChart Message (if you have MyChart) OR A paper copy in the mail If you have any lab test that is abnormal or we need to change your treatment, we will call you to review the results.   Testing/Procedures: None ordered   Follow-Up: At [facility], you and your health needs are our priority.  As part of our continuing mission to provide you with exceptional heart care, we have created designated Provider Care Teams.  These Care Teams include your primary Cardiologist (physician) and Advanced Practice Providers (APPs -  Physician Assistants and Nurse Practitioners) who all work together to provide you with the care you need, when you need it.  We recommend signing up for the patient portal called "MyChart".  Sign up information is provided on this After Visit Summary.  MyChart is used to connect with patients for Virtual Visits (Telemedicine).  Patients are able to view lab/test results, encounter notes, upcoming appointments, etc.  Non-urgent messages can be sent to your provider as well.   To learn more about what you can do with MyChart, go to [email or web address].    Your next appointment:   6 month(s)  The format for your next appointment:   In Person  Provider:   Belva Crome, MD   Other Instructions NA

## 2022-10-08 NOTE — Progress Notes (Signed)
Cardiology Office Note:    Date:  10/08/2022   ID:  [name], DOB 01-15-1941, MRN [id number]  PCP:  Shelda Pal, DO  Cardiologist:  Jenean Lindau, MD   Referring MD: Shelda Pal*    ASSESSMENT:    1. CAD/s/p CABG Mar 2019 w/ DES to vein graft Apr 2019   2. S/P CABG x 3   3. Diabetes mellitus type 2 in obese ([place])   4. Mixed dyslipidemia   5. History of cardiomyopathy    PLAN:    In order of problems listed above:  Coronary artery disease: Secondary prevention stressed with patient.  Importance of compliance with diet medication stressed and she vocalized understanding. History of cardiomyopathy: Stable at this time.  Last ejection fraction was normal and she is happy about it. Essential hypertension: Blood pressure stable and borderline.  She has had a history of fall.  Though she does not give history of postural hypotension I cut down her diuretic to 20 mg daily.  Her ejection fraction is fine and hopefully that should not affect her adversely.  Hopefully it will also get her blood pressure better.  She will keep a track of her blood pressures and get back to [place]. Mixed dyslipidemia: Lipids were reviewed.  I cut down statin to half dose.  Lipids are significantly low and I am not sure we need them to be that low. Patient will be seen in follow-up appointment in 6 months or earlier if the patient has any concerns.  Medication Adjustments/Labs and Tests Ordered: Current medicines are reviewed at length with the patient today.  Concerns regarding medicines are outlined above.  No orders of the defined types were placed in this encounter.  No orders of the defined types were placed in this encounter.    No chief complaint on file.    History of Present Illness:    Margaret Ward is a 81 y.o. female.  Patient has past medical history of coronary artery disease post CABG surgery, history of ischemic cardiomyopathy, essential  hypertension, dyslipidemia.  She is accompanied by her daughter.  She is on guideline directed medical therapy and his ejection fraction has come back to normal.  She is very happy about it.  She had another fall recently.  She does not give history suggestive of any postural hypotension.  At the time of my evaluation, the patient is alert awake oriented and in no distress.  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 s/p CABG in 01/2018. b. Recurrent CP 02/2018 -> cath showing early graft closure SVG-diagonal, received DES to D1.   CAD/s/p CABG Mar 2019 w/ DES to vein graft Apr 2019 05/21/2018   C[place]on ulcer, acute 05/27/2018   Carpal tunnel syndrome, bilateral 12/28/2018   Chest pain 02/05/2018   Chronic combined systolic and diastolic CHF (congestive heart failure) ([place])    a. LVEF 40-45% by echo 02/2018.   Chronic combined systolic and diastolic heart failure (H[facility]) 05/21/2018   Chronic diastolic heart failure ([place])  03/28/2018   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   Chronic lower back pain    CKD (chronic kidney disease), stage III ([place])    Coronary artery disease of native artery of native heart with stable angina pectoris ([place])    a. s/p CABG in 01/2018. b. Recurrent CP 02/2018 -> cath showing early graft closure SVG-diagonal, received DES to D1.   Diabetes mellitus ([place]) 05/21/2018   Diabetes mellitus type 2 in obese ([place]) 04/23/2018   Falls frequently 09/04/2021   Gastric ulcer, unspecified as acute or chronic, without hemorrhage or perforation 05/27/2018   Gastroesophageal  reflux disease 05/21/2018   Glass eye    right eye   Gout    "on daily RX" (03/02/2018)   Heme positive stool 05/21/2018   History of anemia 02/05/2021   History of cholecystectomy 02/05/2021   History of kidney stones    HLD (hyperlipidemia) 05/21/2018   Hyperlipidemia    Hypertension    Hypertensive heart disease    Hypertensive heart disease with heart failure ([place]) 03/03/2018   Kidney cysts    Kidney disease    Left ventricular aneurysm    Migraine    "used to get them twice/week; haven't had them in a long time" (03/02/2018)   NSTEMI (non-ST elevated myocardial infarction) ([place]) 03/01/2018   Obesity (BMI 30-39.9) 07/25/2020   On home oxygen therapy    "3L at night and prn during the day" (03/02/2018)   Physical deconditioning 10/24/2020   Pneumonia    used to get it twice/year; stopped after I had hysterectomy" (03/02/2018)   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   Senile purpura ([place]) 09/04/2021   Sinus tachycardia 03/09/2018   Sleep apnea    SOB (shortness of breath) 03/09/2018   Symptomatic anemia 05/21/2018   Type II diabetes mellitus ([place])     Past Surgical History:  Procedure Laterality Date   BACK SURGERY     BIOPSY  05/22/2018   Procedure: BIOPSY;  Surgeon: Otis Brace, MD;  Location: [facility];  Service: Gastroenterology;;   CARDIAC CATHETERIZATION  12/2017   "before [facility]"   [place] GRAFT  01/19/2018   "CABG X3; in [place]"   CORONARY STENT INTERVENTION N/A 03/02/2018   Procedure: CORONARY STENT INTERVENTION;  Surgeon: Burnell Blanks, MD;  Location: [facility];  Service: Cardiovascular;  Laterality: N/A;   DILATION AND CURETTAGE OF UTERUS     ESOPHAGOGASTRODUODENOSCOPY (EGD) WITH PROPOFOL N/A 05/22/2018   Procedure: ESOPHAGOGASTRODUODENOSCOPY (EGD) WITH PROPOFOL;  Surgeon: Otis Brace, MD;  Location: [facility];  Service: Gastroenterology;  Laterality: N/A;   ESOPHAGOGASTRODUODENOSCOPY (EGD)  WITH PROPOFOL N/A 05/24/2018   Procedure: ESOPHAGOGASTRODUODENOSCOPY (EGD) WITH PROPOFOL;  Surgeon: Otis Brace, MD;  Location: [facility];  Service: Gastroenterology;  Laterality: N/A;   EYE SURGERY Right    "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"   KNEE ARTHROSCOPY Right X 4   LEFT HEART CATH AND CORS/GRAFTS ANGIOGRAPHY N/A 03/02/2018   Procedure: LEFT HEART CATH AND CORS/GRAFTS ANGIOGRAPHY;  Surgeon: Burnell Blanks, MD;  Location: [facility];  Service: Cardiovascular;  Laterality: N/A;   LEFT HEART CATH AND CORS/GRAFTS ANGIOGRAPHY N/A 06/13/2020   Procedure: LEFT HEART CATH AND CORS/GRAFTS ANGIOGRAPHY;  Surgeon: Burnell Blanks, MD;  Location: [facility];  Service: Cardiovascular;  Laterality: N/A;   LUMBAR DISC SURGERY     REDUCTION MAMMAPLASTY Bilateral X 2   SCHLEROTHERAPY  05/24/2018   Procedure: SCHLEROTHERAPY;  Surgeon: Alessandra Bevels,  Parag, MD;  Location: [facility];  Service: Gastroenterology;;   TONSILLECTOMY  1953   VAGINAL HYSTERECTOMY  1970    Current Medications: Current Meds  Medication Sig   acetaminophen (TYLENOL) 500 MG tablet Take 1,000 mg by mouth every 6 (six) hours as needed for headache.    allopurinol (ZYLOPRIM) 100 MG tablet TAKE 1 TABLET(100 MG) BY MOUTH DAILY   atorvastatin (LIPITOR) 40 MG tablet TAKE 1 TABLET(40 MG) BY MOUTH AT BEDTIME   clopidogrel (PLAVIX) 75 MG tablet TAKE 1 TABLET(75 MG) BY MOUTH DAILY   furosemide (LASIX) 40 MG tablet TAKE 1 TABLET BY MOUTH DAILY. TAKE ADDITIONAL TABLET ON MONDAY, WEDNESDAY, AND FRIDAY   JARDIANCE 25 MG TABS tablet TAKE 1 TABLET(25 MG) BY MOUTH DAILY   metFORMIN (GLUCOPHAGE-XR) 500 MG 24 hr tablet TAKE 2 TABLETS(1000 MG) BY MOUTH DAILY WITH BREAKFAST   Multiple Vitamin (MULTIVITAMIN WITH MINERALS) TABS tablet Take 1 tablet by mouth daily.   nitroGLYCERIN (NITROSTAT) 0.4 MG SL tablet Place 1 tablet (0.4 mg total) under the tongue every 5 (five) minutes x 3 doses as needed for chest pain.   pantoprazole  (PROTONIX) 20 MG tablet TAKE 1 TABLET(20 MG) BY MOUTH DAILY   Pseudoeph-Doxylamine-DM-APAP (NYQUIL PO) Take 1 Dose by mouth at bedtime as needed (congestion).    sacubitril-valsartan (ENTRESTO) 49-51 MG Take 1 tablet by mouth 2 (two) times daily.   spironolactone (ALDACTONE) 25 MG tablet TAKE 1/2 TABLET(12.5 MG) BY MOUTH DAILY   venlafaxine XR (EFFEXOR XR) 37.5 MG 24 hr capsule Take 1 capsule (37.5 mg total) by mouth daily with breakfast.   vitamin C (ASCORBIC ACID) 500 MG tablet Take 500 mg by mouth 2 (two) times daily.     Allergies:   Methylprednisolone, Keflex [cephalexin], Ketorolac, Morphine and related, Novocain [procaine], Ramipril, and Septra [sulfamethoxazole-trimethoprim]   Social History   Socioeconomic History   Marital status: Married    Spouse name: Not on file   Number of children: Not on file   Years of education: Not on file   Highest education level: Not on file  Occupational History   Not on file  Tobacco Use   Smoking status: Former    Packs/day: 0.12    Years: 3.00    Total pack years: 0.36    Types: Cigarettes    Quit date: 1963    Years since quitting: 60.9   Smokeless tobacco: Never  Vaping Use   Vaping Use: Never used  Substance and Sexual Activity   Alcohol use: Not Currently   Drug use: Never   Sexual activity: Not on file  Other Topics Concern   Not on file  Social History Narrative   Not on file   Social Determinants of Health   Financial Resource Strain: Low Risk  (07/07/2022)   Overall Financial Resource Strain (CARDIA)    Difficulty of Paying Living Expenses: Not hard at all  Food Insecurity: No Food Insecurity (09/16/2022)   Hunger Vital Sign    Worried About Running Out of Food in the Last Year: Never true    Ran Out of Food in the Last Year: Never true  Transportation Needs: No Transportation Needs (09/16/2022)   PRAPARE - Administrator, Civil Service (Medical): No    Lack of Transportation (Non-Medical): No  Physical  Activity: Insufficiently Active (07/07/2022)   Exercise Vital Sign    Days of Exercise per Week: 3 days    Minutes of Exercise per Session: 30 min  Stress: No Stress  Concern Present (07/07/2022)   [place]    Feeling of Stress : Not at all  Social Connections: Moderately Isolated (07/07/2022)   Social Connection and Isolation Panel [NHANES]    Frequency of Communication with Friends and Family: More than three times a week    Frequency of Social Gatherings with Friends and Family: More than three times a week    Attends Religious Services: Never    Marine scientist or Organizations: No    Attends Music therapist: Never    Marital Status: Married     Family History: The patient's family history includes Diabetes in her father; Heart Problems in her mother; Heart disease in her brother; Hypertension in her brother, daughter, daughter, daughter, and daughter; Kidney disease in her brother and sister.  ROS:   Please see the history of present illness.    All other systems reviewed and are negative.  EKGs/Labs/Other Studies Reviewed:    The following studies were reviewed today: EKG reveals sinus rhythm and nonspecific ST-T changes   Recent Labs: 09/24/2022: ALT 15; BUN 38; Creatinine, Ser 1.52; Potassium 4.5; Sodium 140  Recent Lipid Panel    Component Value Date/Time   CHOL 99 09/24/2022 0903   CHOL 115 04/11/2020 0853   TRIG 97.0 09/24/2022 0903   HDL 58.60 09/24/2022 0903   HDL 57 04/11/2020 0853   CHOLHDL 2 09/24/2022 0903   VLDL 19.4 09/24/2022 0903   LDLCALC 21 09/24/2022 0903   LDLCALC 37 04/11/2020 0853   LDLDIRECT 30 09/08/2019 1512    Physical Exam:    VS:  BP (!) 90/54   Pulse 87   Ht 5\' 2"  (1.575 m)   Wt 143 lb 1.3 oz (64.9 kg)   SpO2 96%   BMI 26.17 kg/m     Wt Readings from Last 3 Encounters:  10/08/22 143 lb 1.3 oz (64.9 kg)  09/30/22 144 lb 4 oz (65.4 kg)   09/24/22 144 lb 2 oz (65.4 kg)     GEN: Patient is in no acute distress HEENT: Normal NECK: No JVD; No carotid bruits LYMPHATICS: No lymphadenopathy CARDIAC: Hear sounds regular, 2/6 systolic murmur at the apex. RESPIRATORY:  Clear to auscultation without rales, wheezing or rhonchi  ABDOMEN: Soft, non-tender, non-distended MUSCULOSKELETAL:  No edema; No deformity  SKIN: Warm and dry NEUROLOGIC:  Alert and oriented x 3 PSYCHIATRIC:  Normal affect   Signed, Jenean Lindau, MD  10/08/2022 8:35 AM    [facility]

## 2022-10-15 ENCOUNTER — Telehealth: Payer: Self-pay | Admitting: Family Medicine

## 2022-10-15 MED ORDER — VENLAFAXINE HCL ER 37.5 MG PO CP24: 37.5000 mg | ORAL_CAPSULE | Freq: Every day | ORAL | 2 refills | Status: DC

## 2022-10-15 NOTE — Telephone Encounter (Signed)
Medication:   venlafaxine XR (EFFEXOR XR) 37.5 MG 24 hr capsule [[id number]]   Has the patient contacted their pharmacy? Yes.   (If no, request that the patient contact the pharmacy for the refill.) (If yes, when and what did the pharmacy advise?)  Contact PCP for refills even though script was written with 2  Preferred Pharmacy (with phone number or street name):   [id number] [place], [place] - [address] AT [address] OF [address] [place] [address] Phone: [phone]  Fax: [phone]  Agent: Please be advised that RX refills may take up to 3 business days. We ask that you follow-up with your pharmacy.

## 2022-10-28 ENCOUNTER — Other Ambulatory Visit (INDEPENDENT_AMBULATORY_CARE_PROVIDER_SITE_OTHER): Payer: Medicare Other

## 2022-10-28 DIAGNOSIS — E1139 Type 2 diabetes mellitus with other diabetic ophthalmic complication: Secondary | ICD-10-CM | POA: Diagnosis not present

## 2022-10-28 LAB — BASIC METABOLIC PANEL
BUN: 29 mg/dL — ABNORMAL HIGH (ref 6–23)
CO2: 26 mEq/L (ref 19–32)
Calcium: 9.1 mg/dL (ref 8.4–10.5)
Chloride: 108 mEq/L (ref 96–112)
Creatinine, Ser: 1.23 mg/dL — ABNORMAL HIGH (ref 0.40–1.20)
GFR: 41.08 mL/min — ABNORMAL LOW (ref >60.00)
Glucose, Bld: 137 mg/dL — ABNORMAL HIGH (ref 70–99)
Potassium: 4.3 mEq/L (ref 3.5–5.1)
Sodium: 141 mEq/L (ref 135–145)

## 2022-10-29 ENCOUNTER — Telehealth: Payer: Self-pay | Admitting: *Deleted

## 2022-10-29 ENCOUNTER — Other Ambulatory Visit (INDEPENDENT_AMBULATORY_CARE_PROVIDER_SITE_OTHER): Payer: Medicare Other

## 2022-10-29 DIAGNOSIS — E1139 Type 2 diabetes mellitus with other diabetic ophthalmic complication: Secondary | ICD-10-CM

## 2022-10-29 DIAGNOSIS — N3000 Acute cystitis without hematuria: Secondary | ICD-10-CM | POA: Diagnosis not present

## 2022-10-29 LAB — MICROALBUMIN / CREATININE URINE RATIO
Creatinine,U: 48.1 mg/dL
Microalb Creat Ratio: 58.8 mg/g — ABNORMAL HIGH (ref 0.0–30.0)
Microalb, Ur: 28.3 mg/dL — ABNORMAL HIGH (ref 0.0–1.9)

## 2022-10-29 LAB — URINALYSIS, MICROSCOPIC ONLY

## 2022-10-29 NOTE — Addendum Note (Signed)
Addended by: Mervin Kung A on: 10/29/2022 09:55 AM   Modules accepted: Orders

## 2022-10-29 NOTE — Progress Notes (Signed)
No charge today. Pt unable to urinate yesterday and returned specimen today.

## 2022-10-29 NOTE — Telephone Encounter (Signed)
Pt was unable to urinate at her lab visit yesterday and was given cup to collect at home and return to [place].  Her daughter returned sample now and stated urine was from this morning. Microalbumin is all that was ordered but urine is very turbid. Called daughter back and asked if pt is currently having symptoms. Reports that pt was treated 2 weeks ago for UTI and symptoms had resolved but reports some intermittent dysuria.  Can we get an order for culture?

## 2022-10-29 NOTE — Telephone Encounter (Signed)
Thank you :)

## 2022-10-29 NOTE — Telephone Encounter (Signed)
Orders added for urine today.

## 2022-11-01 LAB — URINE CULTURE
MICRO NUMBER:: 14309766
SPECIMEN QUALITY:: ADEQUATE

## 2022-11-02 ENCOUNTER — Other Ambulatory Visit: Payer: Self-pay | Admitting: Family Medicine

## 2022-11-02 MED ORDER — NITROFURANTOIN MONOHYD MACRO 100 MG PO CAPS: 100.0000 mg | ORAL_CAPSULE | Freq: Two times a day (BID) | ORAL | 0 refills | Status: AC

## 2022-12-12 ENCOUNTER — Encounter (HOSPITAL_BASED_OUTPATIENT_CLINIC_OR_DEPARTMENT_OTHER): Payer: Self-pay

## 2022-12-12 ENCOUNTER — Emergency Department (HOSPITAL_BASED_OUTPATIENT_CLINIC_OR_DEPARTMENT_OTHER)
Admission: EM | Admit: 2022-12-12 | Discharge: 2022-12-12 | Disposition: A | Discharge status: Home / Self Care | Payer: Medicare Other | Attending: Emergency Medicine | Admitting: Emergency Medicine

## 2022-12-12 ENCOUNTER — Other Ambulatory Visit: Payer: Self-pay

## 2022-12-12 ENCOUNTER — Emergency Department (HOSPITAL_BASED_OUTPATIENT_CLINIC_OR_DEPARTMENT_OTHER): Payer: Medicare Other

## 2022-12-12 DIAGNOSIS — R000 Tachycardia, unspecified: Secondary | ICD-10-CM | POA: Diagnosis not present

## 2022-12-12 DIAGNOSIS — G500 Trigeminal neuralgia: Secondary | ICD-10-CM | POA: Insufficient documentation

## 2022-12-12 DIAGNOSIS — Z7902 Long term (current) use of antithrombotics/antiplatelets: Secondary | ICD-10-CM | POA: Insufficient documentation

## 2022-12-12 DIAGNOSIS — R200 Anesthesia of skin: Secondary | ICD-10-CM

## 2022-12-12 DIAGNOSIS — I672 Cerebral atherosclerosis: Secondary | ICD-10-CM | POA: Diagnosis not present

## 2022-12-12 DIAGNOSIS — I6529 Occlusion and stenosis of unspecified carotid artery: Secondary | ICD-10-CM | POA: Insufficient documentation

## 2022-12-12 DIAGNOSIS — Z79899 Other long term (current) drug therapy: Secondary | ICD-10-CM | POA: Insufficient documentation

## 2022-12-12 DIAGNOSIS — I6521 Occlusion and stenosis of right carotid artery: Secondary | ICD-10-CM | POA: Diagnosis not present

## 2022-12-12 DIAGNOSIS — M47812 Spondylosis without myelopathy or radiculopathy, cervical region: Secondary | ICD-10-CM | POA: Diagnosis not present

## 2022-12-12 DIAGNOSIS — I6789 Other cerebrovascular disease: Secondary | ICD-10-CM | POA: Diagnosis not present

## 2022-12-12 DIAGNOSIS — I6782 Cerebral ischemia: Secondary | ICD-10-CM | POA: Diagnosis not present

## 2022-12-12 LAB — RAPID URINE DRUG SCREEN, HOSP PERFORMED
Amphetamines: NOT DETECTED
Barbiturates: NOT DETECTED
Benzodiazepines: NOT DETECTED
Cocaine: NOT DETECTED
Opiates: NOT DETECTED
Tetrahydrocannabinol: NOT DETECTED

## 2022-12-12 LAB — COMPREHENSIVE METABOLIC PANEL
ALT: 16 U/L (ref 0–44)
AST: 21 U/L (ref 15–41)
Albumin: 4 g/dL (ref 3.5–5.0)
Alkaline Phosphatase: 78 U/L (ref 38–126)
Anion gap: 9 (ref 5–15)
BUN: 39 mg/dL — ABNORMAL HIGH (ref 8–23)
CO2: 21 mmol/L — ABNORMAL LOW (ref 22–32)
Calcium: 8.7 mg/dL — ABNORMAL LOW (ref 8.9–10.3)
Chloride: 104 mmol/L (ref 98–111)
Creatinine, Ser: 1.31 mg/dL — ABNORMAL HIGH (ref 0.44–1.00)
GFR, Estimated: 41 mL/min — ABNORMAL LOW (ref >60)
Glucose, Bld: 141 mg/dL — ABNORMAL HIGH (ref 70–99)
Potassium: 4.7 mmol/L (ref 3.5–5.1)
Sodium: 134 mmol/L — ABNORMAL LOW (ref 135–145)
Total Bilirubin: 0.4 mg/dL (ref 0.3–1.2)
Total Protein: 7.5 g/dL (ref 6.5–8.1)

## 2022-12-12 LAB — URINALYSIS, ROUTINE W REFLEX MICROSCOPIC
Bilirubin Urine: NEGATIVE
Glucose, UA: >=500 mg/dL — AB
Ketones, ur: NEGATIVE mg/dL
Nitrite: NEGATIVE
Protein, ur: 30 mg/dL — AB
Specific Gravity, Urine: 1.01 (ref 1.005–1.030)
pH: 5.5 (ref 5.0–8.0)

## 2022-12-12 LAB — DIFFERENTIAL
Abs Immature Granulocytes: 0.01 10*3/uL (ref 0.00–0.07)
Basophils Absolute: 0.1 10*3/uL (ref 0.0–0.1)
Basophils Relative: 1 %
Eosinophils Absolute: 0.2 10*3/uL (ref 0.0–0.5)
Eosinophils Relative: 2 %
Immature Granulocytes: 0 %
Lymphocytes Relative: 40 %
Lymphs Abs: 2.7 10*3/uL (ref 0.7–4.0)
Monocytes Absolute: 0.5 10*3/uL (ref 0.1–1.0)
Monocytes Relative: 8 %
Neutro Abs: 3.3 10*3/uL (ref 1.7–7.7)
Neutrophils Relative %: 49 %

## 2022-12-12 LAB — URINALYSIS, MICROSCOPIC (REFLEX): WBC, UA: >50 WBC/hpf (ref 0–5)

## 2022-12-12 LAB — ETHANOL: Alcohol, Ethyl (B): <10 mg/dL (ref <10)

## 2022-12-12 LAB — PROTIME-INR
INR: 1 (ref 0.8–1.2)
Prothrombin Time: 12.5 seconds (ref 11.4–15.2)

## 2022-12-12 LAB — CBC
HCT: 42.8 % (ref 36.0–46.0)
Hemoglobin: 14.1 g/dL (ref 12.0–15.0)
MCH: 31.1 pg (ref 26.0–34.0)
MCHC: 32.9 g/dL (ref 30.0–36.0)
MCV: 94.3 fL (ref 80.0–100.0)
Platelets: 215 10*3/uL (ref 150–400)
RBC: 4.54 MIL/uL (ref 3.87–5.11)
RDW: 14.1 % (ref 11.5–15.5)
WBC: 6.8 10*3/uL (ref 4.0–10.5)
nRBC: 0 % (ref 0.0–0.2)

## 2022-12-12 LAB — APTT: aPTT: 30 seconds (ref 24–36)

## 2022-12-12 MED ORDER — CARBAMAZEPINE 200 MG PO TABS: ORAL_TABLET | ORAL | 0 refills | Status: DC

## 2022-12-12 MED ORDER — IOHEXOL 350 MG/ML SOLN
75.0000 mL | Freq: Once | INTRAVENOUS | Status: AC | PRN
  Administered 2022-12-12: 75 mL via INTRAVENOUS

## 2022-12-12 MED ORDER — CARBAMAZEPINE 200 MG PO TABS: 200.0000 mg | ORAL_TABLET | Freq: Two times a day (BID) | ORAL | 0 refills | Status: DC

## 2022-12-12 NOTE — ED Provider Notes (Signed)
[place] [facility] Provider Note   CSN: [id number] Arrival date & time: 12/12/22  1900     History  Chief Complaint  Patient presents with   Numbness    Margaret Ward is a 82 y.o. female here presenting with left facial numbness and pain.  Patient states that it started 2 days ago.  Started on the left upper face.  She states that it went down to the left lower face and she feels a pins-and-needles whenever she touches her face.  Denies any rash.  She has some headaches as well.  Denies any trouble speaking or arm or leg weakness.  Patient has previous right eye surgery.  The history is provided by the patient.       Home Medications Prior to Admission medications   Medication Sig Start Date End Date Taking? Authorizing Provider  acetaminophen (TYLENOL) 500 MG tablet Take 1,000 mg by mouth every 6 (six) hours as needed for headache.     [provider]  allopurinol (ZYLOPRIM) 100 MG tablet TAKE 1 TABLET(100 MG) BY MOUTH DAILY 03/26/22   Wendling, Crosby Oyster, DO  atorvastatin (LIPITOR) 20 MG tablet Take 1 tablet (20 mg total) by mouth daily. TAKE 1 TABLET(40 MG) BY MOUTH AT BEDTIME 10/08/22   Revankar, Reita Cliche, MD  clopidogrel (PLAVIX) 75 MG tablet TAKE 1 TABLET(75 MG) BY MOUTH DAILY 08/27/22   Wendling, Crosby Oyster, DO  furosemide (LASIX) 20 MG tablet TAKE 1 TABLET BY MOUTH DAILY. TAKE ADDITIONAL TABLET ON Laurine Blazer, AND FRIDAY 10/08/22   Revankar, Reita Cliche, MD  JARDIANCE 25 MG TABS tablet TAKE 1 TABLET(25 MG) BY MOUTH DAILY 09/17/22   Nani Ravens, Crosby Oyster, DO  metFORMIN (GLUCOPHAGE-XR) 500 MG 24 hr tablet TAKE 2 TABLETS(1000 MG) BY MOUTH DAILY WITH BREAKFAST 10/01/22   Wendling, Crosby Oyster, DO  Multiple Vitamin (MULTIVITAMIN WITH MINERALS) TABS tablet Take 1 tablet by mouth daily.    [provider]  nitroGLYCERIN (NITROSTAT) 0.4 MG SL tablet Place 1 tablet (0.4 mg total) under the tongue every 5 (five)  minutes x 3 doses as needed for chest pain. 03/03/18   Ledora Bottcher, PA  pantoprazole (PROTONIX) 20 MG tablet TAKE 1 TABLET(20 MG) BY MOUTH DAILY 07/23/22   Wendling, Crosby Oyster, DO  Pseudoeph-Doxylamine-DM-APAP (NYQUIL PO) Take 1 Dose by mouth at bedtime as needed (congestion).     [provider]  sacubitril-valsartan (ENTRESTO) 49-51 MG Take 1 tablet by mouth 2 (two) times daily. 09/10/22   Revankar, Reita Cliche, MD  spironolactone (ALDACTONE) 25 MG tablet TAKE 1/2 TABLET(12.5 MG) BY MOUTH DAILY 06/12/22   Revankar, Reita Cliche, MD  venlafaxine XR (EFFEXOR XR) 37.5 MG 24 hr capsule Take 1 capsule (37.5 mg total) by mouth daily with breakfast. 10/15/22   Wendling, Crosby Oyster, DO  vitamin C (ASCORBIC ACID) 500 MG tablet Take 500 mg by mouth 2 (two) times daily.    [provider]      Allergies    Methylprednisolone, Keflex [cephalexin], Ketorolac, Morphine and related, Novocain [procaine], Ramipril, and Septra [sulfamethoxazole-trimethoprim]    Review of Systems   Review of Systems  Neurological:  Positive for numbness.  All other systems reviewed and are negative.   Physical Exam Updated Vital Signs BP 124/72   Pulse 84   Temp (!) 97.5 F (36.4 C) (Oral)   Resp 18   Ht 5\' 2"  (1.575 m)   Wt 64.9 kg   SpO2 99%   BMI  26.16 kg/m  Physical Exam Vitals and nursing note reviewed.  Constitutional:      Appearance: Normal appearance.     Comments: Chronically ill  HENT:     Mouth/Throat:     Mouth: Mucous membranes are moist.  Eyes:     Comments: Right eye it is closed from previous eye surgery  Cardiovascular:     Rate and Rhythm: Normal rate and regular rhythm.     Pulses: Normal pulses.     Heart sounds: Normal heart sounds.  Pulmonary:     Effort: Pulmonary effort is normal.     Breath sounds: Normal breath sounds.  Abdominal:     General: Abdomen is flat.     Palpations: Abdomen is soft.  Musculoskeletal:        General: Normal range of motion.      Cervical back: Normal range of motion and neck supple.  Skin:    General: Skin is warm.     Capillary Refill: Capillary refill takes less than 2 seconds.  Neurological:     Mental Status: She is alert.     Comments: Decreased sensation of the entire left side of the face.  Patient has no obvious facial droop.  Patient has normal sensation bilateral arms and legs.  Normal strength bilateral arms and legs.  Psychiatric:        Mood and Affect: Mood normal.        Behavior: Behavior normal.     ED Results / Procedures / Treatments   Labs (all labs ordered are listed, but only abnormal results are displayed) Labs Reviewed  COMPREHENSIVE METABOLIC PANEL - Abnormal; Notable for the following components:      Result Value   Sodium 134 (*)    CO2 21 (*)    Glucose, Bld 141 (*)    BUN 39 (*)    Creatinine, Ser 1.31 (*)    Calcium 8.7 (*)    GFR, Estimated 41 (*)    All other components within normal limits  ETHANOL  PROTIME-INR  APTT  CBC  DIFFERENTIAL  RAPID URINE DRUG SCREEN, HOSP PERFORMED  URINALYSIS, ROUTINE W REFLEX MICROSCOPIC    EKG EKG Interpretation  Date/Time:  Friday December 12 2022 19:31:13 EST Ventricular Rate:  84 PR Interval:  164 QRS Duration: 95 QT Interval:  363 QTC Calculation: 430 R Axis:   14 Text Interpretation: Sinus rhythm Low voltage, extremity leads No significant change since last tracing Confirmed by Wandra Arthurs [id number] on 12/12/2022 9:07:46 PM  Radiology CT ANGIO HEAD NECK W WO CM  Result Date: 12/12/2022 CLINICAL DATA:  Initial evaluation for neuro deficit, stroke. EXAM: CT ANGIOGRAPHY HEAD AND NECK TECHNIQUE: Multidetector CT imaging of the head and neck was performed using the standard protocol during bolus administration of intravenous contrast. Multiplanar CT image reconstructions and MIPs were obtained to evaluate the vascular anatomy. Carotid stenosis measurements (when applicable) are obtained utilizing NASCET criteria, using the distal  internal carotid diameter as the denominator. RADIATION DOSE REDUCTION: This exam was performed according to the departmental dose-optimization program which includes automated exposure control, adjustment of the mA and/or kV according to patient size and/or use of iterative reconstruction technique. CONTRAST:  22mL OMNIPAQUE IOHEXOL 350 MG/ML SOLN COMPARISON:  Prior study from 02/06/2022. FINDINGS: CT HEAD FINDINGS Brain: Cerebral volume within normal limits for age. Mild chronic microvascular ischemic disease. No acute intracranial hemorrhage. No acute large vessel territory infarct. No mass lesion or midline shift. No hydrocephalus or extra-axial fluid  collection. Vascular: No abnormal hyperdense vessel. Streak artifact from multiple embolization coils centered about the left orbital apex. Skull: Scalp soft tissues and calvarium within normal limits. 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 Prior ocular lens replacement on the left. Paranasal sinuses and mastoid air cells are clear. Other: None. Review of the MIP images confirms the above findings CTA NECK FINDINGS Aortic arch: Visualized aortic arch normal caliber center branch pattern. No stenosis about the origin of the great vessels. Right carotid system: Right common and internal carotid arteries are patent without dissection. Mild atheromatous change about the right carotid bulb without stenosis. Left carotid system: Left common and internal carotid arteries are patent without dissection. No hemodynamically significant stenosis about the left carotid artery system. Vertebral arteries: Right vertebral artery dominant. Left vertebral artery arises directly from the aortic arch. Vertebral arteries patent without stenosis or dissection. Skeleton: No discrete or worrisome osseous lesions. Median sternotomy wires noted. Mild cervical spondylosis for age. Other neck: No other acute soft tissue abnormality within the neck. Upper  chest: 6 mm right upper lobe pulmonary nodule (series 9, image 20), indeterminate. No acute finding. Review of the MIP images confirms the above findings CTA HEAD FINDINGS Anterior circulation: Mild atheromatous change within patent at the terminus. A1 segments patent. Normal anterior communicating complex. Anterior cerebral arteries patent without stenosis. No M1 stenosis or occlusion. No visible proximal MCA branch occlusion or high-grade stenosis. Distal MCA branches perfused and symmetric. Posterior circulation: Both V4 segments patent without stenosis. Both PICA patent. Basilar partially obscured by streak artifact but is grossly patent without visible stenosis. Superior cerebral arteries patent bilaterally. Left PCA supplied via the basilar. Fetal type origin right PCA. Both PCAs patent without stenosis. Venous sinuses: Grossly patent allowing for timing the contrast bolus. Anatomic variants: As above. No visible aneurysm or other vascular malformation. Review of the MIP images confirms the above findings IMPRESSION: CT HEAD IMPRESSION: 1. No acute intracranial abnormality. 2. Mild chronic microvascular ischemic disease. CTA HEAD AND NECK IMPRESSION: 1. Negative CTA for large vessel occlusion or other emergent finding. 2. Mild for age atheromatous disease without hemodynamically significant or correctable stenosis. 3. Sequelae of prior coil embolization about the left orbital apex. No visible aneurysm or other vascular malformation. 4. 6 mm right upper lobe pulmonary nodule, indeterminate. Per Fleischner Society Guidelines, recommend a non-contrast Chest CT at 6-12 months. If patient is high risk for malignancy, consider an additional non-contrast Chest CT at 18-24 months. If patient is low risk for malignancy, non-contrast Chest CT at 18-24 months is optional. These guidelines do not apply to immunocompromised patients and patients with cancer. Follow up in patients with significant comorbidities as  clinically warranted. For lung cancer screening, adhere to Lung-RADS guidelines. Reference: Radiology. 2017; 284(1):228-43. Electronically Signed   By: Rise Mu M.D.   On: 12/12/2022 21:37    Procedures Procedures    Medications Ordered in ED Medications  iohexol (OMNIPAQUE) 350 MG/ML injection 75 mL (75 mLs Intravenous Contrast Given 12/12/22 2041)    ED Course/ Medical Decision Making/ A&P                             Medical Decision Making TOLUWANI RUDER is a 82 y.o. female here presenting with left facial pain and decreased sensation in the left face.  It started in the V1 distribution 2 days ago and now progressed to the lower face.  I think clinically,  patient has trigeminal neuralgia.  I have low suspicion for stroke.  Given her age and risk factors, will get CTA head and neck and labs.  10:25 PM CTA showed no LVO.  Patient had previous embolization in the left orbital apex.  I discussed case with Dr. Rory Percy from neurology.  He states that patient will likely need neurology follow-up and outpatient MRI.  Given that symptoms are over 2 days now and she has no motor deficits, she can follow-up outpatient.  He states that for trigeminal neuralgia, he recommend low-dose carbamazepine at 200 mg twice daily.  I reviewed patient's labs and they were unremarkable.  UA showed UTI but patient has no symptoms so we will hold off on antibiotics.  She states that she always has many bacteria in her urine.   Problems Addressed: Left facial numbness: acute illness or injury Trigeminal neuralgia: acute illness or injury  Amount and/or Complexity of Data Reviewed Labs: ordered. Decision-making details documented in ED Course. Radiology: ordered and independent interpretation performed. Decision-making details documented in ED Course.  Risk Prescription drug management.    Final Clinical Impression(s) / ED Diagnoses Final diagnoses:  None    Rx / DC Orders ED Discharge  Orders     None         Drenda Freeze, MD 12/12/22 2229

## 2022-12-12 NOTE — ED Notes (Signed)
ED Provider at bedside. 

## 2022-12-12 NOTE — ED Notes (Signed)
Patient transported to CT 

## 2022-12-12 NOTE — ED Triage Notes (Signed)
Pt presents with numbness on LT temple that radiates down LT jaw, LT lip, and LT side of tongue. Reports it began yesterday in the morning. Pt denies slurred speech, weakness, N/V/D. Pt endorses a HA and some sweating. Pt takes plavix at home.

## 2022-12-12 NOTE — ED Notes (Signed)
Pt report she had her first RSV and shingles vaccine on January 12th.

## 2022-12-12 NOTE — Discharge Instructions (Signed)
You likely have trigeminal neuralgia.  As we discussed, I want to start you on low-dose Tegretol 200 mg twice daily  Please call neurology office on Monday for appointment.  You may need to get outpatient MRI and may need to go up on your Tegretol dose if it does not help you with the pain  Return to ER if you have worse numbness or headache or weakness or trouble speaking or uncontrolled left facial pain

## 2022-12-17 ENCOUNTER — Other Ambulatory Visit: Payer: Self-pay | Admitting: Cardiology

## 2022-12-17 DIAGNOSIS — I5032 Chronic diastolic (congestive) heart failure: Secondary | ICD-10-CM

## 2022-12-17 DIAGNOSIS — I110 Hypertensive heart disease with heart failure: Secondary | ICD-10-CM

## 2023-01-07 ENCOUNTER — Other Ambulatory Visit: Payer: Self-pay | Admitting: Family Medicine

## 2023-01-14 ENCOUNTER — Other Ambulatory Visit: Payer: Self-pay | Admitting: Family Medicine

## 2023-01-14 DIAGNOSIS — K219 Gastro-esophageal reflux disease without esophagitis: Secondary | ICD-10-CM

## 2023-02-05 ENCOUNTER — Ambulatory Visit: Payer: Medicare Other | Admitting: Psychiatry

## 2023-03-09 ENCOUNTER — Telehealth: Payer: Self-pay | Admitting: Family Medicine

## 2023-03-09 NOTE — Telephone Encounter (Signed)
She needs an appointment.

## 2023-03-09 NOTE — Telephone Encounter (Signed)
Pt said she has another UTI and wants to know if Carmelia Roller can call in something for her. Please call to advise.

## 2023-03-11 ENCOUNTER — Ambulatory Visit (INDEPENDENT_AMBULATORY_CARE_PROVIDER_SITE_OTHER): Payer: Medicare Other | Admitting: Family Medicine

## 2023-03-11 ENCOUNTER — Other Ambulatory Visit: Payer: Self-pay | Admitting: Cardiology

## 2023-03-11 ENCOUNTER — Other Ambulatory Visit: Payer: Self-pay | Admitting: Family Medicine

## 2023-03-11 ENCOUNTER — Encounter: Payer: Self-pay | Admitting: Family Medicine

## 2023-03-11 VITALS — BP 102/78 | HR 68 | Temp 97.7°F | Ht 62.0 in | Wt 138.0 lb

## 2023-03-11 DIAGNOSIS — I10 Essential (primary) hypertension: Secondary | ICD-10-CM

## 2023-03-11 DIAGNOSIS — I5032 Chronic diastolic (congestive) heart failure: Secondary | ICD-10-CM

## 2023-03-11 DIAGNOSIS — R300 Dysuria: Secondary | ICD-10-CM | POA: Diagnosis not present

## 2023-03-11 DIAGNOSIS — I214 Non-ST elevation (NSTEMI) myocardial infarction: Secondary | ICD-10-CM

## 2023-03-11 LAB — URINALYSIS, ROUTINE W REFLEX MICROSCOPIC
Bilirubin Urine: NEGATIVE
Ketones, ur: NEGATIVE
Nitrite: NEGATIVE
Specific Gravity, Urine: 1.015 (ref 1.000–1.030)
Total Protein, Urine: 100 — AB
Urine Glucose: >=1000 — AB
Urobilinogen, UA: 0.2 (ref 0.0–1.0)
pH: 6 (ref 5.0–8.0)

## 2023-03-11 MED ORDER — LEVOFLOXACIN 750 MG PO TABS: 750.0000 mg | ORAL_TABLET | ORAL | 0 refills | Status: AC

## 2023-03-11 NOTE — Progress Notes (Signed)
Chief Complaint  Patient presents with   Dysuria    Frequent urination     Margaret Ward is a 82 y.o. female here for possible UTI. Here w daughter.   Duration: 3 weeks Symptoms: Dysuria, urinary frequency, urinary hesitancy, urinary retention, and urgency Denies: hematuria, fever, nausea, vomiting, vaginal discharge Hx of recurrent UTI? No Denies new sexual partners.  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 s/p CABG in 01/2018. b. Recurrent CP 02/2018 -> cath showing early graft closure SVG-diagonal, received DES to D1.   CAD/s/p CABG Mar 2019 w/ DES to vein graft Apr 2019 05/21/2018   Cameron ulcer, acute 05/27/2018   Carpal tunnel syndrome, bilateral 12/28/2018   Chest pain 02/05/2018   Chronic combined systolic and diastolic CHF (congestive heart failure)    a. LVEF 40-45% by echo 02/2018.   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 s/p CABG in 01/2018. b. Recurrent CP 02/2018 -> cath showing early graft closure SVG-diagonal, received DES to D1.   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   Kidney cysts    Kidney disease    Left ventricular aneurysm    Migraine    "used to get them twice/week; haven't had them in a long time" (03/02/2018)   NSTEMI (non-ST elevated myocardial infarction) 03/01/2018   Obesity (BMI 30-39.9) 07/25/2020   On home oxygen therapy    "3L at night and prn during the day" (03/02/2018)   Physical deconditioning 10/24/2020   Pneumonia    used to get it twice/year; stopped after I had hysterectomy" (03/02/2018)   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   Senile purpura 09/04/2021   Sinus tachycardia 03/09/2018   Sleep apnea    SOB (shortness of breath) 03/09/2018   Symptomatic anemia 05/21/2018   Type II diabetes mellitus      BP 102/78 (BP Location: Right Arm, Patient Position: Sitting, Cuff Size: Normal)   Pulse 68   Temp 97.7 F (36.5 C) (Oral)   Ht  (1.575 m)   Wt 138 lb (62.6 kg)   SpO2 99%   BMI 25.24 kg/m  General: Awake, alert, appears  stated age Heart: RRR Lungs: CTAB, normal respiratory effort, no accessory muscle usage Abd: BS+, soft, NT, ND, no masses or organomegaly MSK: + R sided CVA tenderness, neg Lloyd's sign Psych: Age appropriate judgment and insight  Dysuria - Plan: levofloxacin (LEVAQUIN) 750 MG tablet  Given allergy list, will do QOD Levaquin to cover for possible kidney involvement. Take probiotic or eat a yogurt daily while on med.  Stay hydrated. Seek immediate care if pt starts to develop fevers, new/worsening symptoms, uncontrollable N/V. F/u prn. The patient and her daughter voiced understanding and agreement to the plan.  [name], DO 03/11/23 9:31  AM

## 2023-03-11 NOTE — Patient Instructions (Addendum)
Stay hydrated.   Warning signs/symptoms: Uncontrollable nausea/vomiting, fevers, worsening symptoms despite treatment, confusion.  Give [place] around 2 business days to get culture back to you (if able to get a sample).  Let us know if you need anything.

## 2023-03-13 LAB — URINE CULTURE
MICRO NUMBER:: 14867808
SPECIMEN QUALITY:: ADEQUATE

## 2023-03-18 ENCOUNTER — Telehealth: Payer: Self-pay | Admitting: Cardiology

## 2023-03-18 ENCOUNTER — Other Ambulatory Visit: Payer: Self-pay | Admitting: Family Medicine

## 2023-03-18 MED ORDER — ENTRESTO 49-51 MG PO TABS: 1.0000 | ORAL_TABLET | Freq: Two times a day (BID) | ORAL | 1 refills | Status: DC

## 2023-03-18 NOTE — Telephone Encounter (Signed)
?*  STAT* If patient is at the pharmacy, call can be transferred to refill team. ? ? ?1. Which medications need to be refilled? (please list name of each medication and dose if known) sacubitril-valsartan (ENTRESTO) 49-51 MG ? ?2. Which pharmacy/location (including street and city if local pharmacy) is medication to be sent to? [id number] - [place], [place] - [address] ? ?3. Do they need a 30 day or 90 day supply? 90 ? ?

## 2023-03-18 NOTE — Telephone Encounter (Signed)
Rx refill sent to pharmacy. 

## 2023-03-25 ENCOUNTER — Ambulatory Visit (INDEPENDENT_AMBULATORY_CARE_PROVIDER_SITE_OTHER): Payer: Medicare Other | Admitting: Family Medicine

## 2023-03-25 ENCOUNTER — Encounter: Payer: Self-pay | Admitting: Family Medicine

## 2023-03-25 ENCOUNTER — Other Ambulatory Visit: Payer: Self-pay | Admitting: Family Medicine

## 2023-03-25 VITALS — BP 108/68 | HR 81 | Temp 98.5°F | Ht 62.0 in | Wt 138.1 lb

## 2023-03-25 DIAGNOSIS — G629 Polyneuropathy, unspecified: Secondary | ICD-10-CM | POA: Diagnosis not present

## 2023-03-25 DIAGNOSIS — Z13220 Encounter for screening for lipoid disorders: Secondary | ICD-10-CM

## 2023-03-25 DIAGNOSIS — Z7984 Long term (current) use of oral hypoglycemic drugs: Secondary | ICD-10-CM | POA: Diagnosis not present

## 2023-03-25 DIAGNOSIS — R300 Dysuria: Secondary | ICD-10-CM | POA: Diagnosis not present

## 2023-03-25 DIAGNOSIS — E1139 Type 2 diabetes mellitus with other diabetic ophthalmic complication: Secondary | ICD-10-CM

## 2023-03-25 DIAGNOSIS — M1A9XX0 Chronic gout, unspecified, without tophus (tophi): Secondary | ICD-10-CM | POA: Diagnosis not present

## 2023-03-25 LAB — HEMOGLOBIN A1C: Hgb A1c MFr Bld: 7.1 % — ABNORMAL HIGH (ref 4.6–6.5)

## 2023-03-25 LAB — COMPREHENSIVE METABOLIC PANEL
ALT: 12 U/L (ref 0–35)
AST: 16 U/L (ref 0–37)
Albumin: 3.8 g/dL (ref 3.5–5.2)
Alkaline Phosphatase: 74 U/L (ref 39–117)
BUN: 32 mg/dL — ABNORMAL HIGH (ref 6–23)
CO2: 25 mEq/L (ref 19–32)
Calcium: 9.2 mg/dL (ref 8.4–10.5)
Chloride: 105 mEq/L (ref 96–112)
Creatinine, Ser: 1.17 mg/dL (ref 0.40–1.20)
GFR: 43.5 mL/min — ABNORMAL LOW (ref >60.00)
Glucose, Bld: 126 mg/dL — ABNORMAL HIGH (ref 70–99)
Potassium: 4.8 mEq/L (ref 3.5–5.1)
Sodium: 141 mEq/L (ref 135–145)
Total Bilirubin: 0.6 mg/dL (ref 0.2–1.2)
Total Protein: 6.6 g/dL (ref 6.0–8.3)

## 2023-03-25 LAB — LIPID PANEL
Cholesterol: 103 mg/dL (ref 0–200)
HDL: 54.5 mg/dL (ref >39.00)
LDL Cholesterol: 24 mg/dL (ref 0–99)
NonHDL: 48.02
Total CHOL/HDL Ratio: 2
Triglycerides: 120 mg/dL (ref 0.0–149.0)
VLDL: 24 mg/dL (ref 0.0–40.0)

## 2023-03-25 LAB — URINALYSIS, MICROSCOPIC ONLY: RBC / HPF: NONE SEEN (ref 0–?)

## 2023-03-25 LAB — URIC ACID: Uric Acid, Serum: 4.1 mg/dL (ref 2.4–7.0)

## 2023-03-25 LAB — MICROALBUMIN / CREATININE URINE RATIO
Creatinine,U: 38.9 mg/dL
Microalb Creat Ratio: 12.4 mg/g (ref 0.0–30.0)
Microalb, Ur: 4.8 mg/dL — ABNORMAL HIGH (ref 0.0–1.9)

## 2023-03-25 MED ORDER — VENLAFAXINE HCL ER 75 MG PO CP24
75.0000 mg | ORAL_CAPSULE | Freq: Every day | ORAL | 1 refills | Status: DC
Start: 2023-03-25 — End: 2023-05-27

## 2023-03-25 MED ORDER — AMOXICILLIN-POT CLAVULANATE 875-125 MG PO TABS: 1.0000 | ORAL_TABLET | Freq: Two times a day (BID) | ORAL | 0 refills | Status: AC

## 2023-03-25 NOTE — Progress Notes (Signed)
Subjective:   Chief Complaint  Patient presents with   Follow-up    Margaret Ward is a 82 y.o. female here for follow-up of diabetes.  Here with daughter and spouse. Kaylenn's self monitored glucose range is 80-90's when she is fasting.  Patient denies hypoglycemic reactions. She checks her glucose levels 3 time(s) per week. Patient does not require insulin.   Medications include: metformin XR 1000 mg/d, Jardiance 25 mg/d Diet is fair.  Exercise: none No new CP or SOB.   Gout Currently on allopurinol 100 mg/d. Compliant, no AE's. Has not had a recent gout flare. Minds her diet.   Neuropathy The patient has burning pain in both of her hands.  She was started on Effexor 37.5 mg daily.  She reports compliance and no adverse effects.  Some improvement with her symptoms.  Still very bothersome to her.  She would like to increase the dosage.  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 s/p CABG in 01/2018. b. Recurrent CP 02/2018 -> cath showing early graft closure SVG-diagonal, received DES to D1.   CAD/s/p CABG Mar 2019 w/ DES to vein graft Apr 2019 05/21/2018   Cameron ulcer, acute 05/27/2018   Carpal tunnel syndrome, bilateral 12/28/2018   Chest pain 02/05/2018   Chronic combined systolic and diastolic CHF (congestive heart failure) (HCC)    a. LVEF 40-45% by echo 02/2018.   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 s/p CABG in 01/2018. b. Recurrent CP 02/2018 -> cath showing early graft closure SVG-diagonal, received DES to D1.   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   Kidney cysts    Kidney disease    Left ventricular aneurysm    Migraine    "used to get them twice/week; haven't had them in a long time" (03/02/2018)   NSTEMI (non-ST elevated myocardial infarction) (HCC) 03/01/2018   Obesity (BMI 30-39.9) 07/25/2020   On home oxygen therapy    "3L at night and prn during the day" (03/02/2018)   Physical deconditioning 10/24/2020   Pneumonia    used to get it twice/year; stopped after  I had hysterectomy" (03/02/2018)   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   Senile purpura (HCC) 09/04/2021   Sinus tachycardia 03/09/2018   Sleep apnea    SOB (shortness of breath) 03/09/2018   Symptomatic anemia 05/21/2018   Type II diabetes mellitus (HCC)      Related testing: Retinal exam: Due, scheduled in 6 d Pneumovax: done  Objective:  BP 108/68 (BP Location: Left Arm, Patient Position: Sitting, Cuff Size: Normal)   Pulse 81   Temp 98.5 F (36.9 C) (Oral)   Ht 5\' 2"  (1.575 m)   Wt 138 lb 2 oz (62.7 kg)   SpO2 92%   BMI 25.26 kg/m  General:  Well developed, well nourished, in no apparent distress Skin:   Warm, no pallor or diaphoresis Head:  Normocephalic, atraumatic Eyes:  Pupils equal and round, sclera anicteric without injection  Lungs:  CTAB, no access msc use Cardio:  RRR, no LE edema Musculoskeletal:  Symmetrical muscle groups noted without atrophy or deformity Neuro:  Sensation intact to pinprick on feet Psych: Age appropriate judgment and insight  Assessment:   Type 2 diabetes mellitus with other ophthalmic complication, without long-term current use of insulin (HCC) - Plan: Comprehensive metabolic panel, Lipid panel, Hemoglobin A1c, Microalbumin / creatinine urine ratio  Chronic gout without tophus, unspecified cause, unspecified site - Plan: Uric acid  Dysuria - Plan: Urine Culture, Urine Microscopic Only  Neuropathy - Plan: venlafaxine XR (EFFEXOR XR) 75 MG 24 hr capsule   Plan:   Chronic, hopefully stable.  Check above.  Had some proteinuria before.  Continue Jardiance 25 mg daily, metformin XR 1000 mg daily.  Counseled on diet and exercise. Chronic, stable.  Continue allopurinol 100 mg daily. Never fully recovered, will check urine culture today.  Will refer to urogyn if no improvement. Chronic, uncontrolled.  Increase venlafaxine from 37.5 mg daily to 75 mg daily.  Follow-up in 1 month. F/u in 6 mo. The patient, her spouse, and her daughter voiced understanding and agreement to the plan.  Jilda Roche [place], DO 03/25/23 9:37 AM

## 2023-03-25 NOTE — Patient Instructions (Addendum)
Give [place] 2-3 business days to get the results of your labs back.   Stay active.  Aim to do some physical exertion for 150 minutes per week. This is typically divided into 5 days per week, 30 minutes per day. The activity should be enough to get your heart rate up. Anything is better than nothing if you have time constraints.  Take 2 capsules of the venlafaxine until you run out. If no better in the next month, let us know.   Let us know if you need anything.

## 2023-03-26 LAB — URINE CULTURE
MICRO NUMBER:: 14929769
Result:: NO GROWTH
SPECIMEN QUALITY:: ADEQUATE

## 2023-04-07 DIAGNOSIS — Z961 Presence of intraocular lens: Secondary | ICD-10-CM | POA: Diagnosis not present

## 2023-04-07 DIAGNOSIS — H524 Presbyopia: Secondary | ICD-10-CM | POA: Diagnosis not present

## 2023-04-07 DIAGNOSIS — Z970 Presence of artificial eye: Secondary | ICD-10-CM | POA: Diagnosis not present

## 2023-04-07 DIAGNOSIS — H43812 Vitreous degeneration, left eye: Secondary | ICD-10-CM | POA: Diagnosis not present

## 2023-04-07 DIAGNOSIS — E119 Type 2 diabetes mellitus without complications: Secondary | ICD-10-CM | POA: Diagnosis not present

## 2023-04-07 DIAGNOSIS — H52222 Regular astigmatism, left eye: Secondary | ICD-10-CM | POA: Diagnosis not present

## 2023-04-07 LAB — HM DIABETES EYE EXAM

## 2023-04-08 ENCOUNTER — Other Ambulatory Visit: Payer: Self-pay | Admitting: Family Medicine

## 2023-05-27 ENCOUNTER — Other Ambulatory Visit: Payer: Self-pay | Admitting: Family Medicine

## 2023-05-27 DIAGNOSIS — G629 Polyneuropathy, unspecified: Secondary | ICD-10-CM

## 2023-07-01 ENCOUNTER — Other Ambulatory Visit: Payer: Self-pay | Admitting: Family Medicine

## 2023-07-01 DIAGNOSIS — K219 Gastro-esophageal reflux disease without esophagitis: Secondary | ICD-10-CM

## 2023-07-09 ENCOUNTER — Ambulatory Visit (INDEPENDENT_AMBULATORY_CARE_PROVIDER_SITE_OTHER): Payer: Medicare Other | Admitting: *Deleted

## 2023-07-09 DIAGNOSIS — Z0000 Encounter for general adult medical examination without abnormal findings: Secondary | ICD-10-CM

## 2023-07-09 NOTE — Patient Instructions (Signed)
Ms. Kniess , Thank you for taking time to come for your Medicare Wellness Visit. I appreciate your ongoing commitment to your health goals. Please review the following plan we discussed and let me know if I can assist you in the future.     This is a list of the screening recommended for you and due dates:  Health Maintenance  Topic Date Due   COVID-19 Vaccine (3 - Moderna risk series) 03/23/2020   Eye exam for diabetics  12/25/2022   Flu Shot  06/18/2023   Hemoglobin A1C  09/25/2023   Yearly kidney function blood test for diabetes  03/24/2024   Yearly kidney health urinalysis for diabetes  03/24/2024   Complete foot exam   03/24/2024   Medicare Annual Wellness Visit  07/08/2024   DTaP/Tdap/Td vaccine (3 - Td or Tdap) 02/07/2032   Pneumonia Vaccine  Completed   DEXA scan (bone density measurement)  Completed   HPV Vaccine  Aged Out   Zoster (Shingles) Vaccine  Discontinued    Next appointment: Follow up in one year for your annual wellness visit.   Preventive Care 23 Years and Older, Female Preventive care refers to lifestyle choices and visits with your health care provider that can promote health and wellness. What does preventive care include? A yearly physical exam. This is also called an annual well check. Dental exams once or twice a year. Routine eye exams. Ask your health care provider how often you should have your eyes checked. Personal lifestyle choices, including: Daily care of your teeth and gums. Regular physical activity. Eating a healthy diet. Avoiding tobacco and drug use. Limiting alcohol use. Practicing safe sex. Taking low-dose aspirin every day. Taking vitamin and mineral supplements as recommended by your health care provider. What happens during an annual well check? The services and screenings done by your health care provider during your annual well check will depend on your age, overall health, lifestyle risk factors, and family history of  disease. Counseling  Your health care provider may ask you questions about your: Alcohol use. Tobacco use. Drug use. Emotional well-being. Home and relationship well-being. Sexual activity. Eating habits. History of falls. Memory and ability to understand (cognition). Work and work Astronomer. Reproductive health. Screening  You may have the following tests or measurements: Height, weight, and BMI. Blood pressure. Lipid and cholesterol levels. These may be checked every 5 years, or more frequently if you are over 71 years old. Skin check. Lung cancer screening. You may have this screening every year starting at age 85 if you have a 30-pack-year history of smoking and currently smoke or have quit within the past 15 years. Fecal occult blood test (FOBT) of the stool. You may have this test every year starting at age 20. Flexible sigmoidoscopy or colonoscopy. You may have a sigmoidoscopy every 5 years or a colonoscopy every 10 years starting at age 46. Hepatitis C blood test. Hepatitis B blood test. Sexually transmitted disease (STD) testing. Diabetes screening. This is done by checking your blood sugar (glucose) after you have not eaten for a while (fasting). You may have this done every 1-3 years. Bone density scan. This is done to screen for osteoporosis. You may have this done starting at age 14. Mammogram. This may be done every 1-2 years. Talk to your health care provider about how often you should have regular mammograms. Talk with your health care provider about your test results, treatment options, and if necessary, the need for more tests. Vaccines  Your health care provider may recommend certain vaccines, such as: Influenza vaccine. This is recommended every year. Tetanus, diphtheria, and acellular pertussis (Tdap, Td) vaccine. You may need a Td booster every 10 years. Zoster vaccine. You may need this after age 63. Pneumococcal 13-valent conjugate (PCV13) vaccine. One  dose is recommended after age 61. Pneumococcal polysaccharide (PPSV23) vaccine. One dose is recommended after age 8. Talk to your health care provider about which screenings and vaccines you need and how often you need them. This information is not intended to replace advice given to you by your health care provider. Make sure you discuss any questions you have with your health care provider. Document Released: 11/30/2015 Document Revised: 07/23/2016 Document Reviewed: 09/04/2015 Elsevier Interactive Patient Education  2017 [employer].  Fall Prevention in the Home Falls can cause injuries. They can happen to people of all ages. There are many things you can do to make your home safe and to help prevent falls. What can I do on the outside of my home? Regularly fix the edges of walkways and driveways and fix any cracks. Remove anything that might make you trip as you walk through a door, such as a raised step or threshold. Trim any bushes or trees on the path to your home. Use bright outdoor lighting. Clear any walking paths of anything that might make someone trip, such as rocks or tools. Regularly check to see if handrails are loose or broken. Make sure that both sides of any steps have handrails. Any raised decks and porches should have guardrails on the edges. Have any leaves, snow, or ice cleared regularly. Use sand or salt on walking paths during winter. Clean up any spills in your garage right away. This includes oil or grease spills. What can I do in the bathroom? Use night lights. Install grab bars by the toilet and in the tub and shower. Do not use towel bars as grab bars. Use non-skid mats or decals in the tub or shower. If you need to sit down in the shower, use a plastic, non-slip stool. Keep the floor dry. Clean up any water that spills on the floor as soon as it happens. Remove soap buildup in the tub or shower regularly. Attach bath mats securely with double-sided  non-slip rug tape. Do not have throw rugs and other things on the floor that can make you trip. What can I do in the bedroom? Use night lights. Make sure that you have a light by your bed that is easy to reach. Do not use any sheets or blankets that are too big for your bed. They should not hang down onto the floor. Have a firm chair that has side arms. You can use this for support while you get dressed. Do not have throw rugs and other things on the floor that can make you trip. What can I do in the kitchen? Clean up any spills right away. Avoid walking on wet floors. Keep items that you use a lot in easy-to-reach places. If you need to reach something above you, use a strong step stool that has a grab bar. Keep electrical cords out of the way. Do not use floor polish or wax that makes floors slippery. If you must use wax, use non-skid floor wax. Do not have throw rugs and other things on the floor that can make you trip. What can I do with my stairs? Do not leave any items on the stairs. Make sure that there are  handrails on both sides of the stairs and use them. Fix handrails that are broken or loose. Make sure that handrails are as long as the stairways. Check any carpeting to make sure that it is firmly attached to the stairs. Fix any carpet that is loose or worn. Avoid having throw rugs at the top or bottom of the stairs. If you do have throw rugs, attach them to the floor with carpet tape. Make sure that you have a light switch at the top of the stairs and the bottom of the stairs. If you do not have them, ask someone to add them for you. What else can I do to help prevent falls? Wear shoes that: Do not have high heels. Have rubber bottoms. Are comfortable and fit you well. Are closed at the toe. Do not wear sandals. If you use a stepladder: Make sure that it is fully opened. Do not climb a closed stepladder. Make sure that both sides of the stepladder are locked into place. Ask  someone to hold it for you, if possible. Clearly mark and make sure that you can see: Any grab bars or handrails. First and last steps. Where the edge of each step is. Use tools that help you move around (mobility aids) if they are needed. These include: Canes. Walkers. Scooters. Crutches. Turn on the lights when you go into a dark area. Replace any light bulbs as soon as they burn out. Set up your furniture so you have a clear path. Avoid moving your furniture around. If any of your floors are uneven, fix them. If there are any pets around you, be aware of where they are. Review your medicines with your doctor. Some medicines can make you feel dizzy. This can increase your chance of falling. Ask your doctor what other things that you can do to help prevent falls. This information is not intended to replace advice given to you by your health care provider. Make sure you discuss any questions you have with your health care provider. Document Released: 08/30/2009 Document Revised: 04/10/2016 Document Reviewed: 12/08/2014 Elsevier Interactive Patient Education  2017 [employer].

## 2023-07-09 NOTE — Progress Notes (Signed)
Subjective:   Margaret Ward is a 82 y.o. female who presents for Medicare Annual (Subsequent) preventive examination.  Visit Complete: Virtual  I connected with  Margaret Ward on 07/09/23 by a audio enabled telemedicine application and verified that I am speaking with the correct person using two identifiers.  Patient Location: Home  Provider Location: Office/Clinic  I discussed the limitations of evaluation and management by telemedicine. The patient expressed understanding and agreed to proceed.   Review of Systems     Cardiac Risk Factors include: advanced age (>12men, >4 women);dyslipidemia;diabetes mellitus;hypertension     Objective:    Vital Signs: Unable to obtain new vitals due to this being a telehealth visit.      07/09/2023    9:41 AM 07/07/2022    9:53 AM 06/13/2020    9:25 AM 05/30/2019    7:02 PM 05/21/2018    6:00 PM 03/06/2018   10:34 AM 03/02/2018    2:51 PM  Advanced Directives  Does Patient Have a Medical Advance Directive? Yes Yes Yes Yes Yes No No  Type of Estate agent of [place];Living will Healthcare Power of [place];Living will Healthcare Power of [place];Living will Healthcare Power of [place];Living will Healthcare Power of [place];Living will    Does patient want to make changes to medical advance directive? No - Patient declined    No - Patient declined    Copy of Healthcare Power of Attorney in Chart? Yes - validated most recent copy scanned in chart (See row information) Yes - validated most recent copy scanned in chart (See row information)   No - copy requested    Would patient like information on creating a medical advance directive?      No - Patient declined Yes (Inpatient - patient defers creating a medical advance directive at this time)    Current Medications (verified) Outpatient Encounter Medications as of 07/09/2023  Medication Sig   acetaminophen (TYLENOL) 500 MG tablet Take 1,000 mg by mouth every 6  (six) hours as needed for headache.    allopurinol (ZYLOPRIM) 100 MG tablet TAKE 1 TABLET(100 MG) BY MOUTH DAILY   atorvastatin (LIPITOR) 20 MG tablet Take 1 tablet (20 mg total) by mouth daily. TAKE 1 TABLET(40 MG) BY MOUTH AT BEDTIME   carbamazepine (TEGRETOL) 200 MG tablet Take 1 tablet (200 mg total) by mouth 2 (two) times daily.   clopidogrel (PLAVIX) 75 MG tablet TAKE 1 TABLET(75 MG) BY MOUTH DAILY   empagliflozin (JARDIANCE) 25 MG TABS tablet Take 1 tablet (25 mg total) by mouth daily.   furosemide (LASIX) 20 MG tablet TAKE 1 TABLET BY MOUTH DAILY. TAKE ADDITIONAL TABLET ON MONDAY, WEDNESDAY, AND FRIDAY   metFORMIN (GLUCOPHAGE-XR) 500 MG 24 hr tablet TAKE 2 TABLETS(1000 MG) BY MOUTH DAILY WITH BREAKFAST   Multiple Vitamin (MULTIVITAMIN WITH MINERALS) TABS tablet Take 1 tablet by mouth daily.   nitroGLYCERIN (NITROSTAT) 0.4 MG SL tablet Place 1 tablet (0.4 mg total) under the tongue every 5 (five) minutes x 3 doses as needed for chest pain.   pantoprazole (PROTONIX) 20 MG tablet TAKE 1 TABLET(20 MG) BY MOUTH DAILY   Pseudoeph-Doxylamine-DM-APAP (NYQUIL PO) Take 1 Dose by mouth at bedtime as needed (congestion).    sacubitril-valsartan (ENTRESTO) 49-51 MG Take 1 tablet by mouth 2 (two) times daily.   spironolactone (ALDACTONE) 25 MG tablet Take 0.5 tablets (12.5 mg total) by mouth daily.   venlafaxine XR (EFFEXOR-XR) 75 MG 24 hr capsule TAKE 1 CAPSULE(75 MG) BY MOUTH  DAILY WITH BREAKFAST   vitamin C (ASCORBIC ACID) 500 MG tablet Take 500 mg by mouth 2 (two) times daily.   No facility-administered encounter medications on file as of 07/09/2023.    Allergies (verified) Methylprednisolone, Keflex [cephalexin], Ketorolac, Morphine and codeine, Novocain [procaine], Ramipril, and Septra [sulfamethoxazole-trimethoprim]   History: Past Medical History:  Diagnosis Date   Anemia    Anemia due to chronic blood loss 02/05/2021   Arthritis    "hands, arms, all over the place" (03/02/2018)    Bilateral carpal tunnel syndrome 10/11/2019   Bronchospasm 03/28/2018   CAD (coronary artery disease) 02/08/2018   Left heart cath 01/13/18: 40% distal LMCAS 90% proximal LAD  LCF with 90% proximal ramus stenosis, diffuse RCA, 60% distal and 90% proximal PDA stenosis   CAD in native artery    a. s/p CABG in 01/2018. b. Recurrent CP 02/2018 -> cath showing early graft closure SVG-diagonal, received DES to D1.   CAD/s/p CABG Mar 2019 w/ DES to vein graft Apr 2019 05/21/2018   Cameron ulcer, acute 05/27/2018   Carpal tunnel syndrome, bilateral 12/28/2018   Chest pain 02/05/2018   Chronic combined systolic and diastolic CHF (congestive heart failure) (HCC)    a. LVEF 40-45% by echo 02/2018.   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 s/p CABG in 01/2018. b. Recurrent CP 02/2018 -> cath showing early graft closure SVG-diagonal, received DES to D1.   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   Kidney cysts    Kidney disease    Left ventricular aneurysm    Migraine    "used to get them twice/week; haven't had them in a long time" (03/02/2018)    NSTEMI (non-ST elevated myocardial infarction) (HCC) 03/01/2018   Obesity (BMI 30-39.9) 07/25/2020   On home oxygen therapy    "3L at night and prn during the day" (03/02/2018)   Physical deconditioning 10/24/2020   Pneumonia    used to get it twice/year; stopped after I had hysterectomy" (03/02/2018)   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   Senile purpura (HCC) 09/04/2021   Sinus tachycardia 03/09/2018   Sleep apnea    SOB (shortness of breath) 03/09/2018   Symptomatic anemia 05/21/2018   Type II diabetes mellitus (HCC)    Past Surgical History:  Procedure Laterality Date   BACK SURGERY     BIOPSY  05/22/2018   Procedure: BIOPSY;  Surgeon: Kathi Der, MD;  Location: [facility];  Service: Gastroenterology;;   CARDIAC CATHETERIZATION  12/2017   "before [facility]"   CHOLECYSTECTOMY OPEN  1982   CORONARY ARTERY BYPASS  GRAFT  01/19/2018   "CABG X3; in [place]"   CORONARY STENT INTERVENTION N/A 03/02/2018   Procedure: CORONARY STENT INTERVENTION;  Surgeon: Kathleene Hazel, MD;  Location: [facility];  Service: Cardiovascular;  Laterality: N/A;   DILATION AND CURETTAGE OF UTERUS     ESOPHAGOGASTRODUODENOSCOPY (EGD) WITH PROPOFOL N/A 05/22/2018   Procedure: ESOPHAGOGASTRODUODENOSCOPY (EGD) WITH PROPOFOL;  Surgeon: Kathi Der, MD;  Location: [facility];  Service: Gastroenterology;  Laterality: N/A;   ESOPHAGOGASTRODUODENOSCOPY (EGD) WITH PROPOFOL N/A 05/24/2018   Procedure: ESOPHAGOGASTRODUODENOSCOPY (EGD) WITH PROPOFOL;  Surgeon: Kathi Der, MD;  Location: [facility];  Service: Gastroenterology;  Laterality: N/A;   EYE SURGERY Right    "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"   KNEE ARTHROSCOPY Right X 4   LEFT HEART CATH AND CORS/GRAFTS ANGIOGRAPHY N/A 03/02/2018   Procedure: LEFT HEART CATH AND CORS/GRAFTS ANGIOGRAPHY;  Surgeon: Kathleene Hazel, MD;  [facility]: [facility];  Service: Cardiovascular;  Laterality: N/A;   LEFT HEART CATH AND CORS/GRAFTS ANGIOGRAPHY N/A  06/13/2020   Procedure: LEFT HEART CATH AND CORS/GRAFTS ANGIOGRAPHY;  Surgeon: Kathleene Hazel, MD;  [facility];  Service: Cardiovascular;  Laterality: N/A;   LUMBAR DISC SURGERY     REDUCTION MAMMAPLASTY Bilateral X 2   SCHLEROTHERAPY  05/24/2018   Procedure: SCHLEROTHERAPY;  Surgeon: Kathi Der, MD;  Location: [facility];  Service: Gastroenterology;;   TONSILLECTOMY  1953   VAGINAL HYSTERECTOMY  1970   Family History  Problem Relation Age of Onset   Heart Problems Mother    Diabetes Father    Kidney disease Sister    Heart disease Brother    Hypertension Brother    Kidney disease Brother    Hypertension Daughter    Hypertension Daughter    Hypertension Daughter    Hypertension Daughter    Social History   Socioeconomic History   Marital status: Married    Spouse name: Not on file   Number of children: Not on file   Years of education: Not on file   Highest education level: Not on file  Occupational History   Not on file  Tobacco Use   Smoking status: Former    Current packs/day: 0.00    Average packs/day: 0.1 packs/day for 3.0 years (0.4 ttl pk-yrs)    Types: Cigarettes    Start date: 43    Quit date: 1963    Years since quitting: 61.6   Smokeless tobacco: Never  Vaping Use   Vaping status: Never Used  Substance and Sexual Activity   Alcohol use: Not Currently   Drug use: Never   Sexual activity: Not on file  Other Topics Concern   Not on file  Social History Narrative   Not on file   Social Determinants of Health   Financial Resource Strain: Low Risk  (07/09/2023)   Overall Financial Resource Strain (CARDIA)    Difficulty of Paying Living Expenses: Not hard at all  Food Insecurity: No Food Insecurity (07/09/2023)   Hunger Vital Sign    Worried About Running Out of Food in the Last Year: Never true    Ran Out of Food in the Last Year: Never true  Transportation Needs: No Transportation Needs (07/09/2023)   PRAPARE -  Transportation    Lack of Transportation (Medical): No    Lack of Transportation (Non-Medical): No  Physical Activity: Inactive (07/09/2023)   Exercise Vital Sign    Days of Exercise per Week: 0 days    Minutes  of Exercise per Session: 0 min  Stress: No Stress Concern Present (07/09/2023)   [employer] of Occupational Health - Occupational Stress Questionnaire    Feeling of Stress : Not at all  Social Connections: Moderately Isolated (07/09/2023)   Social Connection and Isolation Panel [NHANES]    Frequency of Communication with Friends and Family: More than three times a week    Frequency of Social Gatherings with Friends and Family: Once a week    Attends Religious Services: Never    Database administrator or Organizations: No    Attends Engineer, structural: Never    Marital Status: Married    Tobacco Counseling Counseling given: Not Answered   Clinical Intake:  Pre-visit preparation completed: Yes  Pain : No/denies pain  Nutritional Risks: None Diabetes: Yes CBG done?: No Did pt. bring in CBG monitor from home?: No  How often do you need to have someone help you when you read instructions, pamphlets, or other written materials from your doctor or pharmacy?: 1 - Never  Interpreter Needed?: No  Information entered by :: Donne Anon, CMA   Activities of Daily Living    07/09/2023    9:42 AM  In your present state of health, do you have any difficulty performing the following activities:  Hearing? 0  Vision? 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 1  Comment remembering  Walking or climbing stairs? 1  Dressing or bathing? 0  Doing errands, shopping? 1  Comment daughter Insurance claims handler and eating ? N  Using the Toilet? N  In the past six months, have you accidently leaked urine? Y  Do you have problems with loss of bowel control? N  Managing your Medications? N  Managing your Finances? N  Housekeeping  or managing your Housekeeping? N    Patient Care Team: Sharlene Dory, DO as PCP - General (Family Medicine) Thomasene Ripple, DO as PCP - Cardiology (Cardiology)  Indicate any recent Medical Services you may have received from other than [facility] providers in the past year (date may be approximate).     Assessment:   This is a routine wellness examination for Margaret Ward.  Hearing/Vision screen No results found.  Dietary issues and exercise activities discussed:     Goals Addressed   None    Depression Screen    07/09/2023    9:50 AM 09/16/2022    3:50 PM 07/07/2022    9:56 AM 08/02/2021    8:38 AM 05/22/2021    8:27 AM 10/24/2020    8:13 AM  PHQ 2/9 Scores  PHQ - 2 Score 0 0 0 0 0 0    Fall Risk    07/09/2023    9:45 AM 09/24/2022    8:25 AM 07/07/2022    9:54 AM 08/02/2021    8:38 AM 05/22/2021    8:27 AM  Fall Risk   Falls in the past year? 1 1 1  0 1  Number falls in past yr: 0 0 1 0 0  Injury with Fall? 0 1 1 0 1  Risk for fall due to : History of fall(s) No Fall Risks History of fall(s) No Fall Risks No Fall Risks  Follow up Falls evaluation completed Falls evaluation completed Falls prevention discussed Falls evaluation completed Falls evaluation completed    MEDICARE RISK AT HOME: Medicare Risk at Home Any stairs in or around the home?: No If so, are there any without handrails?: No  Home free of loose throw rugs in walkways, pet beds, electrical cords, etc?: Yes Adequate lighting in your home to reduce risk of falls?: Yes Life alert?: No Use of a cane, walker or w/c?: Yes Grab bars in the bathroom?: Yes Shower chair or bench in shower?: No Elevated toilet seat or a handicapped toilet?: Yes  TIMED UP AND GO:  Was the test performed?  No    Cognitive Function:        07/09/2023    9:52 AM 07/07/2022   10:02 AM  6CIT Screen  What Year? 0 points 0 points  What month? 0 points 0 points  What time? 0 points 0 points  Count back from 20 0 points 0  points  Months in reverse 0 points 0 points  Repeat phrase 0 points 2 points  Total Score 0 points 2 points    Immunizations Immunization History  Administered Date(s) Administered   Fluad Quad(high Dose 65+) 09/04/2021, 09/24/2022   Influenza, High Dose Seasonal PF 08/26/2018, 10/03/2020   Influenza-Unspecified 08/17/2017, 09/16/2019   Moderna Sars-Covid-2 Vaccination 01/27/2020, 02/24/2020   PNEUMOCOCCAL CONJUGATE-20 09/04/2021   Pneumococcal Polysaccharide-23 08/26/2018   Td 01/15/2013   Tdap 02/06/2022    TDAP status: Up to date  Flu Vaccine status: Due, Education has been provided regarding the importance of this vaccine. Advised may receive this vaccine at local pharmacy or Health Dept. Aware to provide a copy of the vaccination record if obtained from local pharmacy or Health Dept. Verbalized acceptance and understanding.  Pneumococcal vaccine status: Up to date  Covid-19 vaccine status: Information provided on how to obtain vaccines.   Qualifies for Shingles Vaccine? Yes   Zostavax completed No   Shingrix Completed?: Yes  Screening Tests Health Maintenance  Topic Date Due   COVID-19 Vaccine (3 - Moderna risk series) 03/23/2020   OPHTHALMOLOGY EXAM  12/25/2022   Medicare Annual Wellness (AWV)  07/08/2023   INFLUENZA VACCINE  06/18/2023   HEMOGLOBIN A1C  09/25/2023   Diabetic kidney evaluation - eGFR measurement  03/24/2024   Diabetic kidney evaluation - Urine ACR  03/24/2024   FOOT EXAM  03/24/2024   DTaP/Tdap/Td (3 - Td or Tdap) 02/07/2032   Pneumonia Vaccine 52+ Years old  Completed   DEXA SCAN  Completed   HPV VACCINES  Aged Out   Zoster Vaccines- Shingrix  Discontinued    Health Maintenance  Health Maintenance Due  Topic Date Due   COVID-19 Vaccine (3 - Moderna risk series) 03/23/2020   OPHTHALMOLOGY EXAM  12/25/2022   Medicare Annual Wellness (AWV)  07/08/2023   INFLUENZA VACCINE  06/18/2023    Colorectal cancer screening: No longer required.    Mammogram status: No longer required due to age.  Bone Density status: pt declined.  Lung Cancer Screening: (Low Dose CT Chest recommended if Age 83-80 years, 20 pack-year currently smoking OR have quit w/in 15years.) does not qualify.   Additional Screening:  Hepatitis C Screening: does not qualify  Vision Screening: Recommended annual ophthalmology exams for early detection of glaucoma and other disorders of the eye. Is the patient up to date with their annual eye exam?  Yes  Who is the provider or what is the name of the office in which the patient attends annual eye exams? Dr. Hanley Ben If pt is not established with a provider, would they like to be referred to a provider to establish care? No .   Dental Screening: Recommended annual dental exams for proper oral hygiene  Diabetic Foot  Exam: Diabetic Foot Exam: Completed 03/25/23  Community Resource Referral / Chronic Care Management: CRR required this visit?  No   CCM required this visit?  No     Plan:     I have personally reviewed and noted the following in the patient's chart:   Medical and social history Use of alcohol, tobacco or illicit drugs  Current medications and supplements including opioid prescriptions. Patient is not currently taking opioid prescriptions. Functional ability and status Nutritional status Physical activity Advanced directives List of other physicians Hospitalizations, surgeries, and ER visits in previous 12 months Vitals Screenings to include cognitive, depression, and falls Referrals and appointments  In addition, I have reviewed and discussed with patient certain preventive protocols, quality metrics, and best practice recommendations. A written personalized care plan for preventive services as well as general preventive health recommendations were provided to patient.     Donne Anon, CMA   07/09/2023   After Visit Summary: (MyChart) Due to this being a telephonic visit, the  after visit summary with patients personalized plan was offered to patient via MyChart   Nurse Notes: None

## 2023-07-28 DIAGNOSIS — I10 Essential (primary) hypertension: Secondary | ICD-10-CM | POA: Insufficient documentation

## 2023-07-29 ENCOUNTER — Other Ambulatory Visit: Payer: Self-pay | Admitting: Family Medicine

## 2023-07-29 DIAGNOSIS — G629 Polyneuropathy, unspecified: Secondary | ICD-10-CM

## 2023-07-30 ENCOUNTER — Ambulatory Visit: Payer: Medicare Other | Admitting: Cardiology

## 2023-08-12 ENCOUNTER — Ambulatory Visit: Payer: Medicare Other | Attending: Cardiology | Admitting: Cardiology

## 2023-08-12 ENCOUNTER — Encounter: Payer: Self-pay | Admitting: Cardiology

## 2023-08-12 VITALS — BP 118/68 | HR 84 | Ht 62.0 in | Wt 137.1 lb

## 2023-08-12 DIAGNOSIS — E785 Hyperlipidemia, unspecified: Secondary | ICD-10-CM | POA: Diagnosis not present

## 2023-08-12 DIAGNOSIS — R011 Cardiac murmur, unspecified: Secondary | ICD-10-CM | POA: Diagnosis not present

## 2023-08-12 DIAGNOSIS — I5032 Chronic diastolic (congestive) heart failure: Secondary | ICD-10-CM

## 2023-08-12 DIAGNOSIS — I2510 Atherosclerotic heart disease of native coronary artery without angina pectoris: Secondary | ICD-10-CM

## 2023-08-12 MED ORDER — ATORVASTATIN CALCIUM 10 MG PO TABS: 10.0000 mg | ORAL_TABLET | Freq: Every day | ORAL | 3 refills | Status: DC

## 2023-08-12 MED ORDER — ATORVASTATIN CALCIUM 20 MG PO TABS
20.0000 mg | ORAL_TABLET | Freq: Every day | ORAL | 3 refills | Status: DC
Start: 2023-08-12 — End: 2023-08-12

## 2023-08-12 NOTE — Patient Instructions (Signed)
Medication Instructions:  Your physician has recommended you make the following change in your medication:   Decrease your Atorvastatin to 10 mg daily. Cut your current dose in half and the next RX will be for the 10 mg tablet.  *If you need a refill on your cardiac medications before your next appointment, please call your pharmacy*   Lab Work: None ordered If you have labs (blood work) drawn today and your tests are completely normal, you will receive your results only by: MyChart Message (if you have MyChart) OR A paper copy in the mail If you have any lab test that is abnormal or we need to change your treatment, we will call you to review the results.   Testing/Procedures: Your physician has requested that you have an echocardiogram. Echocardiography is a painless test that uses sound waves to create images of your heart. It provides your doctor with information about the size and shape of your heart and how well your heart's chambers and valves are working. This procedure takes approximately one hour. There are no restrictions for this procedure. Please do NOT wear cologne, perfume, aftershave, or lotions (deodorant is allowed). Please arrive 15 minutes prior to your appointment time.     Follow-Up: At [facility], you and your health needs are our priority.  As part of our continuing mission to provide you with exceptional heart care, we have created designated Provider Care Teams.  These Care Teams include your primary Cardiologist (physician) and Advanced Practice Providers (APPs -  Physician Assistants and Nurse Practitioners) who all work together to provide you with the care you need, when you need it.  We recommend signing up for the patient portal called "MyChart".  Sign up information is provided on this After Visit Summary.  MyChart is used to connect with patients for Virtual Visits (Telemedicine).  Patients are able to view lab/test results, encounter notes, upcoming  appointments, etc.  Non-urgent messages can be sent to your provider as well.   To learn more about what you can do with MyChart, go to [email or web address].    Your next appointment:   9 month(s)  The format for your next appointment:   In Person  Provider:   Belva Crome, MD   Other Instructions Echocardiogram An echocardiogram is a test that uses sound waves (ultrasound) to produce images of the heart. Images from an echocardiogram can provide important information about: Heart size and shape. The size and thickness and movement of your heart's walls. Heart muscle function and strength. Heart valve function or if you have stenosis. Stenosis is when the heart valves are too narrow. If blood is flowing backward through the heart valves (regurgitation). A tumor or infectious growth around the heart valves. Areas of heart muscle that are not working well because of poor blood flow or injury from a heart attack. Aneurysm detection. An aneurysm is a weak or damaged part of an artery wall. The wall bulges out from the normal force of blood pumping through the body. Tell a health care provider about: Any allergies you have. All medicines you are taking, including vitamins, herbs, eye drops, creams, and over-the-counter medicines. Any blood disorders you have. Any surgeries you have had. Any medical conditions you have. Whether you are pregnant or may be pregnant. What are the risks? Generally, this is a safe test. However, problems may occur, including an allergic reaction to dye (contrast) that may be used during the test. What happens before the test? No specific preparation  is needed. You may eat and drink normally. What happens during the test? You will take off your clothes from the waist up and put on a hospital gown. Electrodes or electrocardiogram (ECG)patches may be placed on your chest. The electrodes or patches are then connected to a device that monitors your heart  rate and rhythm. You will lie down on a table for an ultrasound exam. A gel will be applied to your chest to help sound waves pass through your skin. A handheld device, called a transducer, will be pressed against your chest and moved over your heart. The transducer produces sound waves that travel to your heart and bounce back (or "echo" back) to the transducer. These sound waves will be captured in real-time and changed into images of your heart that can be viewed on a video monitor. The images will be recorded on a computer and reviewed by your health care provider. You may be asked to change positions or hold your breath for a short time. This makes it easier to get different views or better views of your heart. In some cases, you may receive contrast through an IV in one of your veins. This can improve the quality of the pictures from your heart. The procedure may vary among health care providers and hospitals.   What can I expect after the test? You may return to your normal, everyday life, including diet, activities, and medicines, unless your health care provider tells you not to do that. Follow these instructions at home: It is up to you to get the results of your test. Ask your health care provider, or the department that is doing the test, when your results will be ready. Keep all follow-up visits. This is important. Summary An echocardiogram is a test that uses sound waves (ultrasound) to produce images of the heart. Images from an echocardiogram can provide important information about the size and shape of your heart, heart muscle function, heart valve function, and other possible heart problems. You do not need to do anything to prepare before this test. You may eat and drink normally. After the echocardiogram is completed, you may return to your normal, everyday life, unless your health care provider tells you not to do that. This information is not intended to replace advice given to  you by your health care provider. Make sure you discuss any questions you have with your health care provider. Document Revised: 06/26/2020 Document Reviewed: 06/26/2020 Elsevier Patient Education  2021 [employer]   Important Information About Sugar

## 2023-08-12 NOTE — Progress Notes (Signed)
Cardiology Office Note:    Date:  08/12/2023   ID:  Margaret Ward, DOB January 27, 1941, MRN [id number]  PCP:  Sharlene Dory, DO  Cardiologist:  Garwin Brothers, MD   Referring MD: Sharlene Dory*    ASSESSMENT:    1. Chronic diastolic heart failure (HCC)   2. Hyperlipidemia LDL goal <70   3. Coronary artery disease involving native coronary artery of native heart without angina pectoris   4. Cardiac murmur    PLAN:    In order of problems listed above:  Coronary artery disease post CABG surgery: Secondary prevention stressed with the patient.  Importance of compliance with diet medication stressed and she vocalized understanding.  She was advised to walk and ambulate to the best of her ability.  She uses a walker. Essential hypertension: Blood pressure stable and diet was emphasized.  Lifestyle modification urged. Cardiac murmur: Echocardiogram will be done to assess murmur heard on auscultation. Mixed dyslipidemia: On lipid-lowering medications.  Lipids especially LDL is markedly low so I have asked her to cut down her statin to half dose.  She will have blood work done by primary care and get back to me with those numbers when due. Patient will be seen in follow-up appointment in 6 months or earlier if the patient has any concerns.    Medication Adjustments/Labs and Tests Ordered: Current medicines are reviewed at length with the patient today.  Concerns regarding medicines are outlined above.  Orders Placed This Encounter  Procedures   ECHOCARDIOGRAM COMPLETE   Meds ordered this encounter  Medications   DISCONTD: atorvastatin (LIPITOR) 20 MG tablet    Sig: Take 1 tablet (20 mg total) by mouth daily. TAKE 1 TABLET(40 MG) BY MOUTH AT BEDTIME    Dispense:  90 tablet    Refill:  3     No chief complaint on file.    History of Present Illness:    Margaret Ward is a 82 y.o. female.  Patient has past medical history of coronary artery disease  post CABG surgery, essential hypertension and mixed dyslipidemia.  She denies any problems at this time and takes care of activities of daily living.  No chest pain orthopnea or PND.  She tells me that she ambulates to the best of her ability.  At the time of my evaluation, the patient is alert awake oriented and in no distress.  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 s/p CABG in 01/2018. b. Recurrent CP 02/2018 -> cath showing early graft closure SVG-diagonal, received DES to D1.   CAD/s/p CABG Mar 2019 w/ DES to vein graft Apr 2019 05/21/2018   Cameron ulcer, acute 05/27/2018   Carpal tunnel syndrome, bilateral 12/28/2018   Chest pain 02/05/2018   Chronic combined systolic and diastolic CHF (congestive heart failure) (HCC)    a. LVEF 40-45% by echo 02/2018.   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 s/p CABG in 01/2018. b. Recurrent CP 02/2018 -> cath showing early graft closure SVG-diagonal, received DES to D1.   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   Essential hypertension 05/21/2018   Falls frequently 09/04/2021   Gastric ulcer, unspecified as acute or chronic, without hemorrhage or perforation 05/27/2018   Gastroesophageal reflux disease  05/21/2018   Glass eye    right eye   Gout    "on daily RX" (03/02/2018)   Heme positive stool 05/21/2018   History of anemia 02/05/2021   History of cardiomyopathy 10/08/2022   History of cholecystectomy 02/05/2021   History of kidney stones    HLD (hyperlipidemia) 05/21/2018   Hyperlipidemia    Hypertension    Hypertensive heart disease    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   Kidney cysts    Kidney disease    Left ventricular aneurysm    Migraine    "used to get them twice/week; haven't had them in a long time" (03/02/2018)   Mixed dyslipidemia 10/08/2022   NSTEMI (non-ST elevated myocardial infarction) (HCC) 03/01/2018   Obesity (BMI 30-39.9) 07/25/2020   On home oxygen therapy    "3L at night and prn during the day" (03/02/2018)   Physical deconditioning 10/24/2020   Pneumonia    used to get it twice/year; stopped after I had hysterectomy" (03/02/2018)   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   Senile purpura (HCC) 09/04/2021   Sinus tachycardia 03/09/2018   Sleep apnea    SOB (shortness of breath) 03/09/2018   Symptomatic anemia 05/21/2018   Type II diabetes mellitus (HCC)     Past Surgical History:  Procedure Laterality Date   BACK SURGERY     BIOPSY  05/22/2018   Procedure: BIOPSY;  Surgeon: Kathi Der, MD;  Location: [facility];  Service: Gastroenterology;;   CARDIAC CATHETERIZATION  12/2017   "before [facility]"   CHOLECYSTECTOMY OPEN  1982   CORONARY ARTERY BYPASS GRAFT  01/19/2018   "CABG X3; in [place]"   CORONARY STENT INTERVENTION N/A 03/02/2018   Procedure: CORONARY STENT INTERVENTION;  Surgeon: Kathleene Hazel, MD;  Location: [facility];  Service: Cardiovascular;  Laterality: N/A;   DILATION AND CURETTAGE OF UTERUS     ESOPHAGOGASTRODUODENOSCOPY (EGD) WITH PROPOFOL N/A 05/22/2018   Procedure: ESOPHAGOGASTRODUODENOSCOPY (EGD) WITH PROPOFOL;  Surgeon: Kathi Der, MD;  Location: [facility];  Service: Gastroenterology;   Laterality: N/A;   ESOPHAGOGASTRODUODENOSCOPY (EGD) WITH PROPOFOL N/A 05/24/2018   Procedure: ESOPHAGOGASTRODUODENOSCOPY (EGD) WITH PROPOFOL;  Surgeon: Kathi Der, MD;  Location: [facility];  Service: Gastroenterology;  Laterality: N/A;   EYE SURGERY Right    "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"   KNEE ARTHROSCOPY Right X 4   LEFT HEART CATH AND CORS/GRAFTS ANGIOGRAPHY N/A 03/02/2018   Procedure: LEFT HEART CATH AND CORS/GRAFTS ANGIOGRAPHY;  Surgeon: Kathleene Hazel, MD;  Location: [facility];  Service: Cardiovascular;  Laterality: N/A;   LEFT HEART CATH AND CORS/GRAFTS ANGIOGRAPHY N/A 06/13/2020   Procedure: LEFT HEART CATH AND CORS/GRAFTS ANGIOGRAPHY;  Surgeon: Kathleene Hazel, MD;  Location: [facility];  Service: Cardiovascular;  Laterality: N/A;   LUMBAR DISC SURGERY     REDUCTION MAMMAPLASTY Bilateral X 2   SCHLEROTHERAPY  05/24/2018  Procedure: SCHLEROTHERAPY;  Surgeon: Kathi Der, MD;  Location: [facility];  Service: Gastroenterology;;   TONSILLECTOMY  1953   VAGINAL HYSTERECTOMY  1970    Current Medications: Current Meds  Medication Sig   acetaminophen (TYLENOL) 500 MG tablet Take 1,000 mg by mouth every 6 (six) hours as needed for headache.    allopurinol (ZYLOPRIM) 100 MG tablet TAKE 1 TABLET(100 MG) BY MOUTH DAILY   carbamazepine (TEGRETOL) 200 MG tablet Take 1 tablet (200 mg total) by mouth 2 (two) times daily.   clopidogrel (PLAVIX) 75 MG tablet TAKE 1 TABLET(75 MG) BY MOUTH DAILY   empagliflozin (JARDIANCE) 25 MG TABS tablet Take 1 tablet (25 mg total) by mouth daily.   furosemide (LASIX) 20 MG tablet TAKE 1 TABLET BY MOUTH DAILY. TAKE ADDITIONAL TABLET ON MONDAY, WEDNESDAY, AND FRIDAY   metFORMIN (GLUCOPHAGE-XR) 500 MG 24 hr tablet TAKE 2 TABLETS(1000 MG) BY MOUTH DAILY WITH BREAKFAST   Multiple Vitamin (MULTIVITAMIN WITH MINERALS) TABS tablet Take 1 tablet by mouth daily.   nitroGLYCERIN (NITROSTAT) 0.4 MG SL tablet Place 1 tablet (0.4 mg  total) under the tongue every 5 (five) minutes x 3 doses as needed for chest pain.   pantoprazole (PROTONIX) 20 MG tablet TAKE 1 TABLET(20 MG) BY MOUTH DAILY   Pseudoeph-Doxylamine-DM-APAP (NYQUIL PO) Take 1 Dose by mouth at bedtime as needed (congestion).    sacubitril-valsartan (ENTRESTO) 49-51 MG Take 1 tablet by mouth 2 (two) times daily.   spironolactone (ALDACTONE) 25 MG tablet Take 0.5 tablets (12.5 mg total) by mouth daily.   venlafaxine XR (EFFEXOR-XR) 75 MG 24 hr capsule TAKE 1 CAPSULE(75 MG) BY MOUTH DAILY WITH BREAKFAST   vitamin C (ASCORBIC ACID) 500 MG tablet Take 500 mg by mouth 2 (two) times daily.   [DISCONTINUED] atorvastatin (LIPITOR) 20 MG tablet Take 1 tablet (20 mg total) by mouth daily. TAKE 1 TABLET(40 MG) BY MOUTH AT BEDTIME     Allergies:   Methylprednisolone, Keflex [cephalexin], Ketorolac, Morphine and codeine, Novocain [procaine], Ramipril, and Septra [sulfamethoxazole-trimethoprim]   Social History   Socioeconomic History   Marital status: Married    Spouse name: Not on file   Number of children: Not on file   Years of education: Not on file   Highest education level: Not on file  Occupational History   Not on file  Tobacco Use   Smoking status: Former    Current packs/day: 0.00    Average packs/day: 0.1 packs/day for 3.0 years (0.4 ttl pk-yrs)    Types: Cigarettes    Start date: 58    Quit date: 1963    Years since quitting: 61.7   Smokeless tobacco: Never  Vaping Use   Vaping status: Never Used  Substance and Sexual Activity   Alcohol use: Not Currently   Drug use: Never   Sexual activity: Not on file  Other Topics Concern   Not on file  Social History Narrative   Not on file   Social Determinants of Health   Financial Resource Strain: Low Risk  (07/09/2023)   Overall Financial Resource Strain (CARDIA)    Difficulty of Paying Living Expenses: Not hard at all  Food Insecurity: No Food Insecurity (07/09/2023)   Hunger Vital Sign     Worried About Running Out of Food in the Last Year: Never true    Ran Out of Food in the Last Year: Never true  Transportation Needs: No Transportation Needs (07/09/2023)   PRAPARE - Administrator, Civil Service (Medical):  No    Lack of Transportation (Non-Medical): No  Physical Activity: Inactive (07/09/2023)   Exercise Vital Sign    Days of Exercise per Week: 0 days    Minutes of Exercise per Session: 0 min  Stress: No Stress Concern Present (07/09/2023)   [employer] of Occupational Health - Occupational Stress Questionnaire    Feeling of Stress : Not at all  Social Connections: Moderately Isolated (07/09/2023)   Social Connection and Isolation Panel [NHANES]    Frequency of Communication with Friends and Family: More than three times a week    Frequency of Social Gatherings with Friends and Family: Once a week    Attends Religious Services: Never    Database administrator or Organizations: No    Attends Engineer, structural: Never    Marital Status: Married     Family History: The patient's family history includes Diabetes in her father; Heart Problems in her mother; Heart disease in her brother; Hypertension in her brother, daughter, daughter, daughter, and daughter; Kidney disease in her brother and sister.  ROS:   Please see the history of present illness.    All other systems reviewed and are negative.  EKGs/Labs/Other Studies Reviewed:    The following studies were reviewed today: I discussed my findings with the patient at length. ..      Recent Labs: 12/12/2022: Hemoglobin 14.1; Platelets 215 03/25/2023: ALT 12; BUN 32; Creatinine, Ser 1.17; Potassium 4.8; Sodium 141  Recent Lipid Panel    Component Value Date/Time   CHOL 103 03/25/2023 0853   CHOL 115 04/11/2020 0853   TRIG 120.0 03/25/2023 0853   HDL 54.50 03/25/2023 0853   HDL 57 04/11/2020 0853   CHOLHDL 2 03/25/2023 0853   VLDL 24.0 03/25/2023 0853   LDLCALC 24 03/25/2023 0853    LDLCALC 37 04/11/2020 0853   LDLDIRECT 30 09/08/2019 1512    Physical Exam:    VS:  BP 118/68   Pulse 84   Ht 5\' 2"  (1.575 m)   Wt 137 lb 1.3 oz (62.2 kg)   SpO2 94%   BMI 25.07 kg/m     Wt Readings from Last 3 Encounters:  08/12/23 137 lb 1.3 oz (62.2 kg)  03/25/23 138 lb 2 oz (62.7 kg)  03/11/23 138 lb (62.6 kg)     GEN: Patient is in no acute distress HEENT: Normal NECK: No JVD; No carotid bruits LYMPHATICS: No lymphadenopathy CARDIAC: Hear sounds regular, 2/6 systolic murmur at the apex. RESPIRATORY:  Clear to auscultation without rales, wheezing or rhonchi  ABDOMEN: Soft, non-tender, non-distended MUSCULOSKELETAL:  No edema; No deformity  SKIN: Warm and dry NEUROLOGIC:  Alert and oriented x 3 PSYCHIATRIC:  Normal affect   Signed, Garwin Brothers, MD  08/12/2023 9:52 AM    [facility]

## 2023-08-12 NOTE — Addendum Note (Signed)
Addended by: Eleonore Chiquito on: 08/12/2023 10:15 AM   Modules accepted: Orders

## 2023-08-19 ENCOUNTER — Other Ambulatory Visit: Payer: Self-pay | Admitting: Family Medicine

## 2023-08-19 ENCOUNTER — Other Ambulatory Visit: Payer: Self-pay | Admitting: Cardiology

## 2023-08-19 DIAGNOSIS — I110 Hypertensive heart disease with heart failure: Secondary | ICD-10-CM

## 2023-08-19 DIAGNOSIS — I5032 Chronic diastolic (congestive) heart failure: Secondary | ICD-10-CM

## 2023-08-19 NOTE — Telephone Encounter (Signed)
Rx refill sent to pharmacy. 

## 2023-09-02 ENCOUNTER — Other Ambulatory Visit: Payer: Self-pay | Admitting: Cardiology

## 2023-09-02 DIAGNOSIS — I10 Essential (primary) hypertension: Secondary | ICD-10-CM

## 2023-09-02 DIAGNOSIS — I214 Non-ST elevation (NSTEMI) myocardial infarction: Secondary | ICD-10-CM

## 2023-09-02 DIAGNOSIS — I5032 Chronic diastolic (congestive) heart failure: Secondary | ICD-10-CM

## 2023-09-10 ENCOUNTER — Ambulatory Visit (HOSPITAL_BASED_OUTPATIENT_CLINIC_OR_DEPARTMENT_OTHER)
Admission: RE | Admit: 2023-09-10 | Discharge: 2023-09-10 | Disposition: A | Discharge status: Home / Self Care | Payer: Medicare Other | Source: Ambulatory Visit | Attending: Cardiology | Admitting: Cardiology

## 2023-09-10 DIAGNOSIS — I5032 Chronic diastolic (congestive) heart failure: Secondary | ICD-10-CM | POA: Diagnosis not present

## 2023-09-10 DIAGNOSIS — I2510 Atherosclerotic heart disease of native coronary artery without angina pectoris: Secondary | ICD-10-CM | POA: Diagnosis not present

## 2023-09-11 LAB — ECHOCARDIOGRAM COMPLETE
AR max vel: 2.39 cm2
AV Area VTI: 2.46 cm2
AV Area mean vel: 2.72 cm2
AV Mean grad: 3 mm[Hg]
AV Peak grad: 4.6 mm[Hg]
Ao pk vel: 1.07 m/s
Area-P 1/2: 10.11 cm2
Calc EF: 55.9 %
S' Lateral: 3.1 cm
Single Plane A2C EF: 53.9 %
Single Plane A4C EF: 59 %

## 2023-09-18 ENCOUNTER — Other Ambulatory Visit: Payer: Self-pay

## 2023-09-18 MED ORDER — METFORMIN HCL ER 500 MG PO TB24: 1000.0000 mg | ORAL_TABLET | Freq: Every day | ORAL | 1 refills | Status: DC

## 2023-09-29 ENCOUNTER — Encounter: Payer: Self-pay | Admitting: Family Medicine

## 2023-09-29 ENCOUNTER — Telehealth: Payer: Self-pay | Admitting: Family Medicine

## 2023-09-29 ENCOUNTER — Encounter: Payer: Self-pay | Admitting: Optometry

## 2023-09-29 ENCOUNTER — Ambulatory Visit (INDEPENDENT_AMBULATORY_CARE_PROVIDER_SITE_OTHER): Payer: Medicare Other | Admitting: Family Medicine

## 2023-09-29 VITALS — BP 128/76 | HR 86 | Temp 98.0°F | Resp 16 | Ht 62.0 in | Wt 137.0 lb

## 2023-09-29 DIAGNOSIS — M79641 Pain in right hand: Secondary | ICD-10-CM | POA: Diagnosis not present

## 2023-09-29 DIAGNOSIS — M1A9XX0 Chronic gout, unspecified, without tophus (tophi): Secondary | ICD-10-CM

## 2023-09-29 DIAGNOSIS — E11649 Type 2 diabetes mellitus with hypoglycemia without coma: Secondary | ICD-10-CM

## 2023-09-29 DIAGNOSIS — G629 Polyneuropathy, unspecified: Secondary | ICD-10-CM | POA: Insufficient documentation

## 2023-09-29 DIAGNOSIS — M79642 Pain in left hand: Secondary | ICD-10-CM

## 2023-09-29 DIAGNOSIS — Z7984 Long term (current) use of oral hypoglycemic drugs: Secondary | ICD-10-CM | POA: Diagnosis not present

## 2023-09-29 LAB — LIPID PANEL
Cholesterol: 112 mg/dL (ref 0–200)
HDL: 58.2 mg/dL (ref >39.00)
LDL Cholesterol: 31 mg/dL (ref 0–99)
NonHDL: 53.86
Total CHOL/HDL Ratio: 2
Triglycerides: 114 mg/dL (ref 0.0–149.0)
VLDL: 22.8 mg/dL (ref 0.0–40.0)

## 2023-09-29 LAB — COMPREHENSIVE METABOLIC PANEL
ALT: 12 U/L (ref 0–35)
AST: 15 U/L (ref 0–37)
Albumin: 4 g/dL (ref 3.5–5.2)
Alkaline Phosphatase: 78 U/L (ref 39–117)
BUN: 34 mg/dL — ABNORMAL HIGH (ref 6–23)
CO2: 24 meq/L (ref 19–32)
Calcium: 9.1 mg/dL (ref 8.4–10.5)
Chloride: 107 meq/L (ref 96–112)
Creatinine, Ser: 1.18 mg/dL (ref 0.40–1.20)
GFR: 42.9 mL/min — ABNORMAL LOW (ref >60.00)
Glucose, Bld: 130 mg/dL — ABNORMAL HIGH (ref 70–99)
Potassium: 4.6 meq/L (ref 3.5–5.1)
Sodium: 141 meq/L (ref 135–145)
Total Bilirubin: 0.6 mg/dL (ref 0.2–1.2)
Total Protein: 6.5 g/dL (ref 6.0–8.3)

## 2023-09-29 LAB — HEMOGLOBIN A1C: Hgb A1c MFr Bld: 7 % — ABNORMAL HIGH (ref 4.6–6.5)

## 2023-09-29 LAB — URIC ACID: Uric Acid, Serum: 4.3 mg/dL (ref 2.4–7.0)

## 2023-09-29 MED ORDER — EMPAGLIFLOZIN 25 MG PO TABS: 25.0000 mg | ORAL_TABLET | Freq: Every day | ORAL | 3 refills | Status: DC

## 2023-09-29 MED ORDER — VENLAFAXINE HCL ER 75 MG PO CP24: 75.0000 mg | ORAL_CAPSULE | Freq: Every day | ORAL | 3 refills | Status: DC

## 2023-09-29 NOTE — Telephone Encounter (Signed)
Margaret Ward with [facility] called for clarification on whether the pt needs physical therapy, occupational therapy or both. Please call and advise at [phone].

## 2023-09-29 NOTE — Patient Instructions (Addendum)
Give [place] 2-3 business days to get the results of your labs back.   Keep the diet clean and stay active.  Please consider adding some weight resistance exercise to your routine. Consider yoga as well.   Ice/cold pack over area for 10-15 min twice daily.  Foods that may reduce pain: 1) Ginger 2) Blueberries 3) Salmon 4) Pumpkin seeds 5) Dark chocolate 6) Turmeric 7) Tart cherries 8) Virgin olive oil 9) Chili peppers 10) Mint 11) Krill oil  Let us know if you need anything.  Hand Exercises Hand exercises can be helpful for almost anyone. These exercises can strengthen the hands, improve flexibility and movement, and increase blood flow to the hands. These results can make work and daily tasks easier. Hand exercises can be especially helpful for people who have joint pain from arthritis or have nerve damage from overuse (carpal tunnel syndrome). These exercises can also help people who have injured a hand. Exercises Most of these hand exercises are gentle stretching and motion exercises. It is usually safe to do them often throughout the day. Warming up your hands before exercise may help to reduce stiffness. You can do this with gentle massage or by placing your hands in warm water for 10-15 minutes. It is normal to feel some stretching, pulling, tightness, or mild discomfort as you begin new exercises. This will gradually improve. Stop an exercise right away if you feel sudden, severe pain or your pain gets worse. Ask your health care provider which exercises are best for you. Knuckle bend or "claw" fist Stand or sit with your arm, hand, and all five fingers pointed straight up. Make sure to keep your wrist straight during the exercise. Gently bend your fingers down toward your palm until the tips of your fingers are touching the top of your palm. Keep your big knuckle straight and just bend the small knuckles in your fingers. Hold this position for 3 seconds. Straighten (extend) your  fingers back to the starting position. Repeat this exercise 5-10 times with each hand. Full finger fist Stand or sit with your arm, hand, and all five fingers pointed straight up. Make sure to keep your wrist straight during the exercise. Gently bend your fingers into your palm until the tips of your fingers are touching the middle of your palm. Hold this position for 3 seconds. Extend your fingers back to the starting position, stretching every joint fully. Repeat this exercise 5-10 times with each hand. Straight fist Stand or sit with your arm, hand, and all five fingers pointed straight up. Make sure to keep your wrist straight during the exercise. Gently bend your fingers at the big knuckle, where your fingers meet your hand, and the middle knuckle. Keep the knuckle at the tips of your fingers straight and try to touch the bottom of your palm. Hold this position for 3 seconds. Extend your fingers back to the starting position, stretching every joint fully. Repeat this exercise 5-10 times with each hand. Tabletop Stand or sit with your arm, hand, and all five fingers pointed straight up. Make sure to keep your wrist straight during the exercise. Gently bend your fingers at the big knuckle, where your fingers meet your hand, as far down as you can while keeping the small knuckles in your fingers straight. Think of forming a tabletop with your fingers. Hold this position for 3 seconds. Extend your fingers back to the starting position, stretching every joint fully. Repeat this exercise 5-10 times with each hand.  Finger spread Place your hand flat on a table with your palm facing down. Make sure your wrist stays straight as you do this exercise. Spread your fingers and thumb apart from each other as far as you can until you feel a gentle stretch. Hold this position for 3 seconds. Bring your fingers and thumb tight together again. Hold this position for 3 seconds. Repeat this exercise 5-10  times with each hand. Making circles Stand or sit with your arm, hand, and all five fingers pointed straight up. Make sure to keep your wrist straight during the exercise. Make a circle by touching the tip of your thumb to the tip of your index finger. Hold for 3 seconds. Then open your hand wide. Repeat this motion with your thumb and each finger on your hand. Repeat this exercise 5-10 times with each hand. Thumb motion Sit with your forearm resting on a table and your wrist straight. Your thumb should be facing up toward the ceiling. Keep your fingers relaxed as you move your thumb. Lift your thumb up as high as you can toward the ceiling. Hold for 3 seconds. Bend your thumb across your palm as far as you can, reaching the tip of your thumb for the small finger (pinkie) side of your palm. Hold for 3 seconds. Repeat this exercise 5-10 times with each hand. Grip strengthening  Hold a stress ball or other soft ball in the middle of your hand. Slowly increase the pressure, squeezing the ball as much as you can without causing pain. Think of bringing the tips of your fingers into the middle of your palm. All of your finger joints should bend when doing this exercise. Hold your squeeze for 3 seconds, then relax. Repeat this exercise 5-10 times with each hand. Contact a health care provider if: Your hand pain or discomfort gets much worse when you do an exercise. Your hand pain or discomfort does not improve within 2 hours after you exercise. If you have any of these problems, stop doing these exercises right away. Do not do them again unless your health care provider says that you can. Get help right away if: You develop sudden, severe hand pain or swelling. If this happens, stop doing these exercises right away. Do not do them again unless your health care provider says that you can. Make sure you discuss any questions you have with your health care provider. Document Revised: 02/24/2019  Document Reviewed: 11/04/2018 Elsevier Patient Education  2020 [employer].

## 2023-09-29 NOTE — Progress Notes (Signed)
Subjective:   Chief Complaint  Patient presents with   Follow-up    Follow up    Margaret Ward is a 82 y.o. female here for follow-up of diabetes.   Sarie's self monitored glucose range is mid 100's.  Patient denies hypoglycemic reactions. She checks her glucose levels 3 time(s) per day. Patient does not require insulin.   Medications include: Jardiance 25 mg daily, metformin XR 1000 mg daily Diet is fair.  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  Gout History of gout, taking allopurinol 100 mg daily.  Reports compliance and no adverse effects.  No recent flares.  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  She saw a hand specialist who wanted to do a procedure but cardiology did not cleared her.  She is starting to have weakness.  Tylenol is not particularly helpful.  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 s/p CABG in 01/2018. b. Recurrent CP 02/2018 -> cath showing early graft closure SVG-diagonal, received DES to D1.   CAD/s/p CABG Mar 2019 w/ DES to vein graft Apr 2019 05/21/2018   Cameron ulcer, acute 05/27/2018   Carpal tunnel syndrome, bilateral 12/28/2018   Chest pain 02/05/2018   Chronic combined systolic and diastolic CHF (congestive heart failure) (HCC)    a. LVEF 40-45% by echo 02/2018.   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 s/p CABG in 01/2018. b. Recurrent CP 02/2018 -> cath showing early graft closure SVG-diagonal, received DES to D1.   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   Essential hypertension 05/21/2018   Falls frequently 09/04/2021   Gastric ulcer, unspecified as acute or chronic, without hemorrhage or perforation 05/27/2018   Gastroesophageal reflux disease 05/21/2018   Glass eye    right eye   Gout    "on daily RX" (03/02/2018)   Heme positive stool 05/21/2018   History of anemia 02/05/2021   History of cardiomyopathy 10/08/2022   History of cholecystectomy 02/05/2021   History of kidney stones    HLD (hyperlipidemia) 05/21/2018   Hyperlipidemia    Hypertension    Hypertensive heart disease    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   Kidney cysts    Kidney disease    Left ventricular aneurysm    Migraine    "used to get them twice/week; haven't had them in a long time" (03/02/2018)   Mixed dyslipidemia 10/08/2022   NSTEMI (non-ST elevated myocardial infarction) (HCC) 03/01/2018   Obesity (BMI 30-39.9) 07/25/2020   On home oxygen therapy    "3L at night and prn during  the day" (03/02/2018)   Physical deconditioning 10/24/2020   Pneumonia    used to get it twice/year; stopped after I had hysterectomy" (03/02/2018)   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   Senile purpura (HCC) 09/04/2021   Sinus tachycardia 03/09/2018   Sleep apnea    SOB (shortness of breath) 03/09/2018   Symptomatic anemia 05/21/2018   Type II diabetes mellitus (HCC)      Related testing: Retinal exam: Done Pneumovax: done  Objective:  BP 128/76 (BP Location: Left Arm, Patient Position: Sitting, Cuff Size: Normal)   Pulse 86   Temp 98 F (36.7 C) (Oral)   Resp 16   Ht 5\' 2"  (1.575 m)    Wt 137 lb (62.1 kg)   SpO2 98%   BMI 25.06 kg/m  General:  Well developed, well nourished, in no apparent distress MSK: Atrophy of hand muscles noted on the right compared to the left; Heberden and Bouchard's nodes appreciated, no other obvious deformity, erythema, or edema Lungs:  CTAB, no access msc use Cardio:  RRR, no bruits, no LE edema Psych: Age appropriate judgment and insight  Assessment:   Type 2 diabetes mellitus with hypoglycemia without coma, without long-term current use of insulin (HCC) - Plan: Comprehensive metabolic panel, Lipid panel, Hemoglobin A1c  Chronic gout without tophus, unspecified cause, unspecified site - Plan: Uric acid  Neuropathy - Plan: venlafaxine XR (EFFEXOR-XR) 75 MG 24 hr capsule  Pain in both hands - Plan: Ambulatory referral to Home Health   Plan:   Chronic, stable.  Continue Jardiance 25 mg daily, metformin XR 1000 mg daily.  Counseled on diet and exercise. Chronic, stable.  Check above.  Continue allopurinol 100 mg daily. Chronic, stable.  Continue venlafaxine XR 75 mg daily. Chronic, uncontrolled.  Refer to occupational therapy.  Stretches and exercises for the hands provided.  Foods/supplements for lower inflammation provided in her paperwork as well. F/u in 6 mo. The patient voiced understanding and agreement to the plan.  Jilda Roche [place], DO 09/29/23 10:33 AM

## 2023-09-30 NOTE — Telephone Encounter (Signed)
Called [name] spoke to [facility] left message with her about clarification on service needed, provider notes said  Occupational therapy.

## 2023-09-30 NOTE — Telephone Encounter (Signed)
Called [facility] back spoke with Adelina Mings, verbal order was given for OT and PT for additional consults. Advised them to let pt know. Adelina Mings stated yes they would.  Adelina Mings from [facility] called back, stated OT cannot be the only discipline for hh. Please advise if additional orders can be placed. Callback is [phone]

## 2023-09-30 NOTE — Telephone Encounter (Signed)
Adelina Mings from [facility] called back, stated OT cannot be the only discipline for hh. Please advise if additional orders can be placed. Callback is [phone]

## 2023-10-01 ENCOUNTER — Telehealth: Payer: Self-pay | Admitting: Family Medicine

## 2023-10-01 DIAGNOSIS — R011 Cardiac murmur, unspecified: Secondary | ICD-10-CM | POA: Diagnosis not present

## 2023-10-01 DIAGNOSIS — I252 Old myocardial infarction: Secondary | ICD-10-CM | POA: Diagnosis not present

## 2023-10-01 DIAGNOSIS — I5042 Chronic combined systolic (congestive) and diastolic (congestive) heart failure: Secondary | ICD-10-CM | POA: Diagnosis not present

## 2023-10-01 DIAGNOSIS — I253 Aneurysm of heart: Secondary | ICD-10-CM | POA: Diagnosis not present

## 2023-10-01 DIAGNOSIS — D692 Other nonthrombocytopenic purpura: Secondary | ICD-10-CM | POA: Diagnosis not present

## 2023-10-01 DIAGNOSIS — E782 Mixed hyperlipidemia: Secondary | ICD-10-CM | POA: Diagnosis not present

## 2023-10-01 DIAGNOSIS — N281 Cyst of kidney, acquired: Secondary | ICD-10-CM | POA: Diagnosis not present

## 2023-10-01 DIAGNOSIS — D631 Anemia in chronic kidney disease: Secondary | ICD-10-CM | POA: Diagnosis not present

## 2023-10-01 DIAGNOSIS — G43909 Migraine, unspecified, not intractable, without status migrainosus: Secondary | ICD-10-CM | POA: Diagnosis not present

## 2023-10-01 DIAGNOSIS — M150 Primary generalized (osteo)arthritis: Secondary | ICD-10-CM | POA: Diagnosis not present

## 2023-10-01 DIAGNOSIS — G4730 Sleep apnea, unspecified: Secondary | ICD-10-CM | POA: Diagnosis not present

## 2023-10-01 DIAGNOSIS — I25118 Atherosclerotic heart disease of native coronary artery with other forms of angina pectoris: Secondary | ICD-10-CM | POA: Diagnosis not present

## 2023-10-01 DIAGNOSIS — Z7984 Long term (current) use of oral hypoglycemic drugs: Secondary | ICD-10-CM | POA: Diagnosis not present

## 2023-10-01 DIAGNOSIS — M1A9XX0 Chronic gout, unspecified, without tophus (tophi): Secondary | ICD-10-CM | POA: Diagnosis not present

## 2023-10-01 DIAGNOSIS — D500 Iron deficiency anemia secondary to blood loss (chronic): Secondary | ICD-10-CM | POA: Diagnosis not present

## 2023-10-01 DIAGNOSIS — N183 Chronic kidney disease, stage 3 unspecified: Secondary | ICD-10-CM | POA: Diagnosis not present

## 2023-10-01 DIAGNOSIS — E11649 Type 2 diabetes mellitus with hypoglycemia without coma: Secondary | ICD-10-CM | POA: Diagnosis not present

## 2023-10-01 DIAGNOSIS — E669 Obesity, unspecified: Secondary | ICD-10-CM | POA: Diagnosis not present

## 2023-10-01 DIAGNOSIS — K259 Gastric ulcer, unspecified as acute or chronic, without hemorrhage or perforation: Secondary | ICD-10-CM | POA: Diagnosis not present

## 2023-10-01 DIAGNOSIS — M545 Low back pain, unspecified: Secondary | ICD-10-CM | POA: Diagnosis not present

## 2023-10-01 DIAGNOSIS — I130 Hypertensive heart and chronic kidney disease with heart failure and stage 1 through stage 4 chronic kidney disease, or unspecified chronic kidney disease: Secondary | ICD-10-CM | POA: Diagnosis not present

## 2023-10-01 DIAGNOSIS — G8929 Other chronic pain: Secondary | ICD-10-CM | POA: Diagnosis not present

## 2023-10-01 DIAGNOSIS — K219 Gastro-esophageal reflux disease without esophagitis: Secondary | ICD-10-CM | POA: Diagnosis not present

## 2023-10-01 DIAGNOSIS — E1140 Type 2 diabetes mellitus with diabetic neuropathy, unspecified: Secondary | ICD-10-CM | POA: Diagnosis not present

## 2023-10-01 DIAGNOSIS — E1122 Type 2 diabetes mellitus with diabetic chronic kidney disease: Secondary | ICD-10-CM | POA: Diagnosis not present

## 2023-10-01 NOTE — Telephone Encounter (Signed)
Olegario Messier called back to give correct fax# [phone]

## 2023-10-01 NOTE — Telephone Encounter (Signed)
Caller/Agency: [facility]  Callback Number: [phone] Requesting OT/PT/Skilled Nursing/Social Work/Speech Therapy: PT  Frequency: 1x5  Also, pt uses oxygen at night but needs New order to be sent if provider thinks it's still helpful. Please fax #[phone]

## 2023-10-02 DIAGNOSIS — E11649 Type 2 diabetes mellitus with hypoglycemia without coma: Secondary | ICD-10-CM | POA: Diagnosis not present

## 2023-10-02 DIAGNOSIS — E1140 Type 2 diabetes mellitus with diabetic neuropathy, unspecified: Secondary | ICD-10-CM | POA: Diagnosis not present

## 2023-10-02 DIAGNOSIS — I130 Hypertensive heart and chronic kidney disease with heart failure and stage 1 through stage 4 chronic kidney disease, or unspecified chronic kidney disease: Secondary | ICD-10-CM | POA: Diagnosis not present

## 2023-10-02 DIAGNOSIS — E1122 Type 2 diabetes mellitus with diabetic chronic kidney disease: Secondary | ICD-10-CM | POA: Diagnosis not present

## 2023-10-02 DIAGNOSIS — N183 Chronic kidney disease, stage 3 unspecified: Secondary | ICD-10-CM | POA: Diagnosis not present

## 2023-10-02 DIAGNOSIS — I5042 Chronic combined systolic (congestive) and diastolic (congestive) heart failure: Secondary | ICD-10-CM | POA: Diagnosis not present

## 2023-10-05 ENCOUNTER — Other Ambulatory Visit: Payer: Self-pay

## 2023-10-05 ENCOUNTER — Telehealth: Payer: Self-pay | Admitting: Family Medicine

## 2023-10-05 DIAGNOSIS — I5042 Chronic combined systolic (congestive) and diastolic (congestive) heart failure: Secondary | ICD-10-CM | POA: Diagnosis not present

## 2023-10-05 DIAGNOSIS — Z9981 Dependence on supplemental oxygen: Secondary | ICD-10-CM

## 2023-10-05 DIAGNOSIS — I130 Hypertensive heart and chronic kidney disease with heart failure and stage 1 through stage 4 chronic kidney disease, or unspecified chronic kidney disease: Secondary | ICD-10-CM | POA: Diagnosis not present

## 2023-10-05 DIAGNOSIS — E11649 Type 2 diabetes mellitus with hypoglycemia without coma: Secondary | ICD-10-CM | POA: Diagnosis not present

## 2023-10-05 DIAGNOSIS — J9611 Chronic respiratory failure with hypoxia: Secondary | ICD-10-CM

## 2023-10-05 DIAGNOSIS — E1140 Type 2 diabetes mellitus with diabetic neuropathy, unspecified: Secondary | ICD-10-CM | POA: Diagnosis not present

## 2023-10-05 DIAGNOSIS — E1122 Type 2 diabetes mellitus with diabetic chronic kidney disease: Secondary | ICD-10-CM | POA: Diagnosis not present

## 2023-10-05 DIAGNOSIS — N183 Chronic kidney disease, stage 3 unspecified: Secondary | ICD-10-CM | POA: Diagnosis not present

## 2023-10-05 NOTE — Telephone Encounter (Signed)
Called [place], [facility] verbal order was given and Marilynne Drivers I will call pt to spoke with her about oxygen. Callback Number: [phone] Requesting OT/PT/Skilled Nursing/Social Work/Speech Therapy: PT

## 2023-10-05 NOTE — Telephone Encounter (Signed)
Called Radovan back with [facility] given verbal order to continue OT.

## 2023-10-05 NOTE — Telephone Encounter (Signed)
Do they need a documented SpO2 < 88 in the office?

## 2023-10-05 NOTE — Telephone Encounter (Signed)
Radovan with [facility] called to request verbal orders for the patient to continue OT for 1 week 8. The patient was evaluated on 11/15, when she stated OT. Please call and advise at [phone].

## 2023-10-05 NOTE — Telephone Encounter (Signed)
Referral has been placed for Pulmonology and called pt was advised we placed referral. Pt advised she would hear from them about appt.

## 2023-10-07 DIAGNOSIS — E11649 Type 2 diabetes mellitus with hypoglycemia without coma: Secondary | ICD-10-CM | POA: Diagnosis not present

## 2023-10-07 DIAGNOSIS — E1122 Type 2 diabetes mellitus with diabetic chronic kidney disease: Secondary | ICD-10-CM | POA: Diagnosis not present

## 2023-10-07 DIAGNOSIS — N183 Chronic kidney disease, stage 3 unspecified: Secondary | ICD-10-CM | POA: Diagnosis not present

## 2023-10-07 DIAGNOSIS — I5042 Chronic combined systolic (congestive) and diastolic (congestive) heart failure: Secondary | ICD-10-CM | POA: Diagnosis not present

## 2023-10-07 DIAGNOSIS — I130 Hypertensive heart and chronic kidney disease with heart failure and stage 1 through stage 4 chronic kidney disease, or unspecified chronic kidney disease: Secondary | ICD-10-CM | POA: Diagnosis not present

## 2023-10-07 DIAGNOSIS — E1140 Type 2 diabetes mellitus with diabetic neuropathy, unspecified: Secondary | ICD-10-CM | POA: Diagnosis not present

## 2023-10-12 DIAGNOSIS — I5042 Chronic combined systolic (congestive) and diastolic (congestive) heart failure: Secondary | ICD-10-CM | POA: Diagnosis not present

## 2023-10-12 DIAGNOSIS — N183 Chronic kidney disease, stage 3 unspecified: Secondary | ICD-10-CM | POA: Diagnosis not present

## 2023-10-12 DIAGNOSIS — E11649 Type 2 diabetes mellitus with hypoglycemia without coma: Secondary | ICD-10-CM | POA: Diagnosis not present

## 2023-10-12 DIAGNOSIS — I130 Hypertensive heart and chronic kidney disease with heart failure and stage 1 through stage 4 chronic kidney disease, or unspecified chronic kidney disease: Secondary | ICD-10-CM | POA: Diagnosis not present

## 2023-10-12 DIAGNOSIS — E1140 Type 2 diabetes mellitus with diabetic neuropathy, unspecified: Secondary | ICD-10-CM | POA: Diagnosis not present

## 2023-10-12 DIAGNOSIS — E1122 Type 2 diabetes mellitus with diabetic chronic kidney disease: Secondary | ICD-10-CM | POA: Diagnosis not present

## 2023-10-19 DIAGNOSIS — E11649 Type 2 diabetes mellitus with hypoglycemia without coma: Secondary | ICD-10-CM | POA: Diagnosis not present

## 2023-10-19 DIAGNOSIS — I130 Hypertensive heart and chronic kidney disease with heart failure and stage 1 through stage 4 chronic kidney disease, or unspecified chronic kidney disease: Secondary | ICD-10-CM | POA: Diagnosis not present

## 2023-10-19 DIAGNOSIS — E1140 Type 2 diabetes mellitus with diabetic neuropathy, unspecified: Secondary | ICD-10-CM | POA: Diagnosis not present

## 2023-10-19 DIAGNOSIS — I5042 Chronic combined systolic (congestive) and diastolic (congestive) heart failure: Secondary | ICD-10-CM | POA: Diagnosis not present

## 2023-10-19 DIAGNOSIS — N183 Chronic kidney disease, stage 3 unspecified: Secondary | ICD-10-CM | POA: Diagnosis not present

## 2023-10-19 DIAGNOSIS — E1122 Type 2 diabetes mellitus with diabetic chronic kidney disease: Secondary | ICD-10-CM | POA: Diagnosis not present

## 2023-10-20 DIAGNOSIS — E11649 Type 2 diabetes mellitus with hypoglycemia without coma: Secondary | ICD-10-CM | POA: Diagnosis not present

## 2023-10-20 DIAGNOSIS — E1140 Type 2 diabetes mellitus with diabetic neuropathy, unspecified: Secondary | ICD-10-CM | POA: Diagnosis not present

## 2023-10-20 DIAGNOSIS — N183 Chronic kidney disease, stage 3 unspecified: Secondary | ICD-10-CM | POA: Diagnosis not present

## 2023-10-20 DIAGNOSIS — I130 Hypertensive heart and chronic kidney disease with heart failure and stage 1 through stage 4 chronic kidney disease, or unspecified chronic kidney disease: Secondary | ICD-10-CM | POA: Diagnosis not present

## 2023-10-20 DIAGNOSIS — I5042 Chronic combined systolic (congestive) and diastolic (congestive) heart failure: Secondary | ICD-10-CM | POA: Diagnosis not present

## 2023-10-20 DIAGNOSIS — E1122 Type 2 diabetes mellitus with diabetic chronic kidney disease: Secondary | ICD-10-CM | POA: Diagnosis not present

## 2023-10-28 DIAGNOSIS — N183 Chronic kidney disease, stage 3 unspecified: Secondary | ICD-10-CM | POA: Diagnosis not present

## 2023-10-28 DIAGNOSIS — E1140 Type 2 diabetes mellitus with diabetic neuropathy, unspecified: Secondary | ICD-10-CM | POA: Diagnosis not present

## 2023-10-28 DIAGNOSIS — E1122 Type 2 diabetes mellitus with diabetic chronic kidney disease: Secondary | ICD-10-CM | POA: Diagnosis not present

## 2023-10-28 DIAGNOSIS — E11649 Type 2 diabetes mellitus with hypoglycemia without coma: Secondary | ICD-10-CM | POA: Diagnosis not present

## 2023-10-28 DIAGNOSIS — I5042 Chronic combined systolic (congestive) and diastolic (congestive) heart failure: Secondary | ICD-10-CM | POA: Diagnosis not present

## 2023-10-28 DIAGNOSIS — I130 Hypertensive heart and chronic kidney disease with heart failure and stage 1 through stage 4 chronic kidney disease, or unspecified chronic kidney disease: Secondary | ICD-10-CM | POA: Diagnosis not present

## 2023-10-30 ENCOUNTER — Telehealth: Payer: Self-pay | Admitting: Family Medicine

## 2023-10-30 NOTE — Telephone Encounter (Signed)
Noted  

## 2023-10-30 NOTE — Telephone Encounter (Signed)
Radovan called with [facility] to inform Dr. Carmelia Roller that the patient told him she had too many obligations this week and wanted to cxl her visit and resume OT next week. Radovan said to call if there's any questions at [phone].

## 2023-10-31 DIAGNOSIS — M1A9XX0 Chronic gout, unspecified, without tophus (tophi): Secondary | ICD-10-CM | POA: Diagnosis not present

## 2023-10-31 DIAGNOSIS — D631 Anemia in chronic kidney disease: Secondary | ICD-10-CM | POA: Diagnosis not present

## 2023-10-31 DIAGNOSIS — N281 Cyst of kidney, acquired: Secondary | ICD-10-CM | POA: Diagnosis not present

## 2023-10-31 DIAGNOSIS — K259 Gastric ulcer, unspecified as acute or chronic, without hemorrhage or perforation: Secondary | ICD-10-CM | POA: Diagnosis not present

## 2023-10-31 DIAGNOSIS — E1140 Type 2 diabetes mellitus with diabetic neuropathy, unspecified: Secondary | ICD-10-CM | POA: Diagnosis not present

## 2023-10-31 DIAGNOSIS — I253 Aneurysm of heart: Secondary | ICD-10-CM | POA: Diagnosis not present

## 2023-10-31 DIAGNOSIS — G8929 Other chronic pain: Secondary | ICD-10-CM | POA: Diagnosis not present

## 2023-10-31 DIAGNOSIS — I252 Old myocardial infarction: Secondary | ICD-10-CM | POA: Diagnosis not present

## 2023-10-31 DIAGNOSIS — K219 Gastro-esophageal reflux disease without esophagitis: Secondary | ICD-10-CM | POA: Diagnosis not present

## 2023-10-31 DIAGNOSIS — E1122 Type 2 diabetes mellitus with diabetic chronic kidney disease: Secondary | ICD-10-CM | POA: Diagnosis not present

## 2023-10-31 DIAGNOSIS — D500 Iron deficiency anemia secondary to blood loss (chronic): Secondary | ICD-10-CM | POA: Diagnosis not present

## 2023-10-31 DIAGNOSIS — G4730 Sleep apnea, unspecified: Secondary | ICD-10-CM | POA: Diagnosis not present

## 2023-10-31 DIAGNOSIS — E11649 Type 2 diabetes mellitus with hypoglycemia without coma: Secondary | ICD-10-CM | POA: Diagnosis not present

## 2023-10-31 DIAGNOSIS — I25118 Atherosclerotic heart disease of native coronary artery with other forms of angina pectoris: Secondary | ICD-10-CM | POA: Diagnosis not present

## 2023-10-31 DIAGNOSIS — I5042 Chronic combined systolic (congestive) and diastolic (congestive) heart failure: Secondary | ICD-10-CM | POA: Diagnosis not present

## 2023-10-31 DIAGNOSIS — G43909 Migraine, unspecified, not intractable, without status migrainosus: Secondary | ICD-10-CM | POA: Diagnosis not present

## 2023-10-31 DIAGNOSIS — E782 Mixed hyperlipidemia: Secondary | ICD-10-CM | POA: Diagnosis not present

## 2023-10-31 DIAGNOSIS — I130 Hypertensive heart and chronic kidney disease with heart failure and stage 1 through stage 4 chronic kidney disease, or unspecified chronic kidney disease: Secondary | ICD-10-CM | POA: Diagnosis not present

## 2023-10-31 DIAGNOSIS — N183 Chronic kidney disease, stage 3 unspecified: Secondary | ICD-10-CM | POA: Diagnosis not present

## 2023-10-31 DIAGNOSIS — M150 Primary generalized (osteo)arthritis: Secondary | ICD-10-CM | POA: Diagnosis not present

## 2023-10-31 DIAGNOSIS — E669 Obesity, unspecified: Secondary | ICD-10-CM | POA: Diagnosis not present

## 2023-10-31 DIAGNOSIS — M545 Low back pain, unspecified: Secondary | ICD-10-CM | POA: Diagnosis not present

## 2023-10-31 DIAGNOSIS — R011 Cardiac murmur, unspecified: Secondary | ICD-10-CM | POA: Diagnosis not present

## 2023-10-31 DIAGNOSIS — Z7984 Long term (current) use of oral hypoglycemic drugs: Secondary | ICD-10-CM | POA: Diagnosis not present

## 2023-10-31 DIAGNOSIS — D692 Other nonthrombocytopenic purpura: Secondary | ICD-10-CM | POA: Diagnosis not present

## 2023-11-05 ENCOUNTER — Telehealth: Payer: Self-pay

## 2023-11-05 NOTE — Telephone Encounter (Signed)
Noted  

## 2023-11-05 NOTE — Telephone Encounter (Signed)
Copied from CRM [id number]. Topic: General - Other >> Nov 05, 2023  1:14 PM Melissa C wrote: Reason for CRM: patient's Occupational Therapist Radovan from [facility] called. Radovan wanted to advise MD that patient will be missing Home Health OT sessions this week as she has flu symptoms. If you have any questions, you can contact [facility] OT therapist at [phone]

## 2023-11-12 DIAGNOSIS — I5042 Chronic combined systolic (congestive) and diastolic (congestive) heart failure: Secondary | ICD-10-CM | POA: Diagnosis not present

## 2023-11-12 DIAGNOSIS — E11649 Type 2 diabetes mellitus with hypoglycemia without coma: Secondary | ICD-10-CM | POA: Diagnosis not present

## 2023-11-12 DIAGNOSIS — I130 Hypertensive heart and chronic kidney disease with heart failure and stage 1 through stage 4 chronic kidney disease, or unspecified chronic kidney disease: Secondary | ICD-10-CM | POA: Diagnosis not present

## 2023-11-12 DIAGNOSIS — E1140 Type 2 diabetes mellitus with diabetic neuropathy, unspecified: Secondary | ICD-10-CM | POA: Diagnosis not present

## 2023-11-12 DIAGNOSIS — E1122 Type 2 diabetes mellitus with diabetic chronic kidney disease: Secondary | ICD-10-CM | POA: Diagnosis not present

## 2023-11-12 DIAGNOSIS — N183 Chronic kidney disease, stage 3 unspecified: Secondary | ICD-10-CM | POA: Diagnosis not present

## 2023-11-16 ENCOUNTER — Telehealth: Payer: Self-pay | Admitting: Family Medicine

## 2023-11-16 DIAGNOSIS — N183 Chronic kidney disease, stage 3 unspecified: Secondary | ICD-10-CM | POA: Diagnosis not present

## 2023-11-16 DIAGNOSIS — E1140 Type 2 diabetes mellitus with diabetic neuropathy, unspecified: Secondary | ICD-10-CM | POA: Diagnosis not present

## 2023-11-16 DIAGNOSIS — E11649 Type 2 diabetes mellitus with hypoglycemia without coma: Secondary | ICD-10-CM | POA: Diagnosis not present

## 2023-11-16 DIAGNOSIS — I5042 Chronic combined systolic (congestive) and diastolic (congestive) heart failure: Secondary | ICD-10-CM | POA: Diagnosis not present

## 2023-11-16 DIAGNOSIS — I130 Hypertensive heart and chronic kidney disease with heart failure and stage 1 through stage 4 chronic kidney disease, or unspecified chronic kidney disease: Secondary | ICD-10-CM | POA: Diagnosis not present

## 2023-11-16 DIAGNOSIS — E1122 Type 2 diabetes mellitus with diabetic chronic kidney disease: Secondary | ICD-10-CM | POA: Diagnosis not present

## 2023-11-16 NOTE — Telephone Encounter (Signed)
Copied from CRM [id number]. Topic: General - Other >> Nov 16, 2023  4:39 PM Clayton Bibles wrote: Reason for CRM: OT with  [facility] called to let doctor know that Margaret Ward will be discharged today from OT. She does not need any home health now. Questions please call [phone]

## 2023-11-17 NOTE — Telephone Encounter (Signed)
 Noted.

## 2023-12-24 ENCOUNTER — Other Ambulatory Visit: Payer: Self-pay | Admitting: Family Medicine

## 2023-12-24 DIAGNOSIS — K219 Gastro-esophageal reflux disease without esophagitis: Secondary | ICD-10-CM

## 2024-01-04 ENCOUNTER — Ambulatory Visit: Payer: Self-pay | Admitting: Family Medicine

## 2024-01-04 NOTE — Telephone Encounter (Signed)
Copied from CRM [id number]. Topic: Clinical - Red Word Triage >> Jan 04, 2024  3:14 PM Alvino Blood C wrote: Red Word that prompted transfer to Nurse Triage: Patient states she is barley able to move her right arm. She is hearing a clicking like sound when its extended and her back and shoulder are swollen. Patient states this has been going on for about a week or so.  Chief Complaint: right arm pain Symptoms: pain, back of should blade and arm Frequency: constant Pertinent Negatives: Patient denies cp, sob, numbness, weakness Disposition: [] ED /[] Urgent Care (no appt availability in office) / [x] Appointment(In office/virtual)/ []  [place] [facility]/ [] Home Care/ [] Refused Recommended Disposition /[] [place] [facility]/ []  Follow-up with PCP Additional Notes: states had this arm operated on before due to the same issues.  Apt made for tomorrow.  Instructed to go to the er if becomes worse.  Reason for Disposition  [1] MODERATE pain (e.g., interferes with normal activities) AND [2] present > 3 days  Answer Assessment - Initial Assessment Questions 1. ONSET: "When did the pain start?"     Week ago 2. LOCATION: "Where is the pain located?"     Right arm pain 3. PAIN: "How bad is the pain?" (Scale 1-10; or mild, moderate, severe)   - MILD (1-3): Doesn't interfere with normal activities.   - MODERATE (4-7): Interferes with normal activities (e.g., work or school) or awakens from sleep.   - SEVERE (8-10): Excruciating pain, unable to do any normal activities, unable to hold a cup of water.     9/10 4. WORK OR EXERCISE: "Has there been any recent work or exercise that involved this part of the body?"     denies 5. CAUSE: "What do you think is causing the arm pain?"     denies 6. OTHER SYMPTOMS: "Do you have any other symptoms?" (e.g., neck pain, swelling, rash, fever, numbness, weakness)     Swelling to right arm and shoulder, back of neck pain 7. PREGNANCY: "Is there any chance you are  pregnant?" "When was your last menstrual period?"     na  Protocols used: Arm Pain-A-AH

## 2024-01-05 ENCOUNTER — Ambulatory Visit (INDEPENDENT_AMBULATORY_CARE_PROVIDER_SITE_OTHER): Payer: Medicare Other | Admitting: Family Medicine

## 2024-01-05 ENCOUNTER — Ambulatory Visit: Payer: Medicare Other | Admitting: Medical

## 2024-01-05 ENCOUNTER — Ambulatory Visit (HOSPITAL_BASED_OUTPATIENT_CLINIC_OR_DEPARTMENT_OTHER)
Admission: RE | Admit: 2024-01-05 | Discharge: 2024-01-05 | Disposition: A | Discharge status: Home / Self Care | Payer: Medicare Other | Source: Ambulatory Visit | Attending: Family Medicine | Admitting: Family Medicine

## 2024-01-05 ENCOUNTER — Encounter: Payer: Self-pay | Admitting: Family Medicine

## 2024-01-05 ENCOUNTER — Ambulatory Visit: Payer: Medicare Other | Admitting: Family Medicine

## 2024-01-05 VITALS — BP 110/70 | HR 92 | Temp 98.1°F | Resp 18 | Ht 62.0 in | Wt 135.4 lb

## 2024-01-05 VITALS — BP 110/70 | HR 92 | Ht 62.0 in | Wt 135.0 lb

## 2024-01-05 DIAGNOSIS — M19029 Primary osteoarthritis, unspecified elbow: Secondary | ICD-10-CM | POA: Diagnosis not present

## 2024-01-05 DIAGNOSIS — M25511 Pain in right shoulder: Secondary | ICD-10-CM

## 2024-01-05 DIAGNOSIS — M19011 Primary osteoarthritis, right shoulder: Secondary | ICD-10-CM | POA: Diagnosis not present

## 2024-01-05 DIAGNOSIS — G629 Polyneuropathy, unspecified: Secondary | ICD-10-CM

## 2024-01-05 DIAGNOSIS — M85811 Other specified disorders of bone density and structure, right shoulder: Secondary | ICD-10-CM | POA: Diagnosis not present

## 2024-01-05 MED ORDER — TRIAMCINOLONE ACETONIDE 40 MG/ML IJ SUSP
40.0000 mg | Freq: Once | INTRAMUSCULAR | Status: AC
  Administered 2024-01-05: 40 mg via INTRA_ARTICULAR

## 2024-01-05 NOTE — Progress Notes (Unsigned)
Established Patient Office Visit  Subjective   Patient ID: Margaret Ward, female    DOB: [birth year]  Age: 83 y.o. MRN: [id number]  Chief Complaint  Patient presents with   Arm Pain    Right arm pain, couples weeks, no falls or injuries.     HPI Discussed the use of AI scribe software for clinical note transcription with the patient, who gave verbal consent to proceed.  History of Present Illness   RUTHER EPHRAIM "Fredia Sorrow" is an 83 year old female who presents with right shoulder pain radiating to the elbow.  She has been experiencing right shoulder pain radiating down to the elbow for the past couple of weeks. The pain has decreased somewhat but persists, and she is unable to lift her arm past a certain point. There is swelling in the shoulder area and a clicking sound when the shoulder is moved. No recent trauma, falls, or fights have occurred that could have caused the pain. The onset was gradual, starting when she was getting dressed. Initially, she did not seek medical attention as she believed there was not much that could be done, but the pain has since worsened. She has a history of surgery on the same shoulder many years ago when she lived in [place]. There have been no prior x-rays of the shoulder since the surgery. For pain management, she has been using over-the-counter medication, which provides temporary relief. She also uses a chair with heat, which helps alleviate the pain but is described as more annoying than anything.  She mentions having neuropathy in her hands, which causes pain and affects her ability to feel, making tasks like eating with her left hand difficult.  She lives in an apartment and is supported by her son who lives nearby. She is right-handed and performs daily activities such as cooking.      Patient Active Problem List   Diagnosis Date Noted   Neuropathy 09/29/2023   Cardiac murmur 08/12/2023   Hypertension    Mixed dyslipidemia 10/08/2022    History of cardiomyopathy 10/08/2022   CAD in native artery 10/07/2022   Senile purpura (HCC) 09/04/2021   Falls frequently 09/04/2021   Anemia due to chronic blood loss 02/05/2021   History of anemia 02/05/2021   History of cholecystectomy 02/05/2021   Physical deconditioning 10/24/2020   Anemia    Arthritis    Coronary artery disease of native artery of native heart with stable angina pectoris (HCC)    Chronic combined systolic and diastolic CHF (congestive heart failure) (HCC)    Chronic lower back pain    Glass eye    Gout    History of kidney stones    Hypertensive heart disease    Kidney cysts    Kidney disease    Left ventricular aneurysm    Migraine    On home oxygen therapy    Pneumonia    Obesity (BMI 30-39.9) 07/25/2020   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   Bilateral carpal tunnel syndrome 10/11/2019   Carpal tunnel syndrome, bilateral 12/28/2018   Gastric ulcer, unspecified as acute or chronic, without hemorrhage or perforation 05/27/2018   Cameron ulcer, acute 05/27/2018   Symptomatic anemia 05/21/2018   Essential hypertension 05/21/2018   CAD/s/p CABG Mar 2019 w/ DES to vein graft Apr 2019 05/21/2018   Gastroesophageal reflux disease 05/21/2018   Diabetes mellitus (HCC) 05/21/2018   Chronic combined systolic and diastolic heart failure (HCC) 05/21/2018   Heme positive stool 05/21/2018  HLD (hyperlipidemia) 05/21/2018   Sleep apnea 05/21/2018   CKD (chronic kidney disease), stage III (HCC) 05/21/2018   Type II diabetes mellitus (HCC) 05/21/2018   Type 2 diabetes mellitus with obesity (HCC) 04/23/2018   Chronic diastolic heart failure (HCC) 03/28/2018   Bronchospasm 03/28/2018   Sinus tachycardia 03/09/2018   SOB (shortness of breath) 03/09/2018   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   NSTEMI (non-ST elevated myocardial infarction) (HCC) 03/01/2018   CAD (coronary artery disease) 02/08/2018   Hyperlipidemia 02/08/2018    Chest pain 02/05/2018   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 s/p CABG in 01/2018. b. Recurrent CP 02/2018 -> cath showing early graft closure SVG-diagonal, received DES to D1.   CAD/s/p CABG Mar 2019 w/ DES to vein graft Apr 2019 05/21/2018   Cameron ulcer, acute 05/27/2018   Carpal tunnel syndrome, bilateral 12/28/2018   Chest pain 02/05/2018   Chronic combined systolic and diastolic CHF (congestive heart failure) (HCC)    a. LVEF 40-45% by echo 02/2018.   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 s/p CABG in 01/2018. b. Recurrent CP 02/2018 -> cath showing early graft closure SVG-diagonal, received DES to D1.   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   Essential hypertension 05/21/2018   Falls frequently 09/04/2021   Gastric ulcer, unspecified as acute or chronic, without hemorrhage or perforation 05/27/2018   Gastroesophageal reflux disease 05/21/2018   Glass eye    right eye   Gout    "on daily RX" (03/02/2018)   Heme positive stool 05/21/2018   History of anemia 02/05/2021   History of cardiomyopathy 10/08/2022   History of cholecystectomy 02/05/2021   History of kidney stones    HLD (hyperlipidemia)  05/21/2018   Hyperlipidemia    Hypertension    Hypertensive heart disease    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   Kidney cysts    Kidney disease    Left ventricular aneurysm    Migraine    "used to get them twice/week; haven't had them in a long time" (03/02/2018)   Mixed dyslipidemia 10/08/2022   NSTEMI (non-ST elevated myocardial infarction) (HCC) 03/01/2018   Obesity (BMI 30-39.9) 07/25/2020   On home oxygen therapy    "3L at night and prn during the day" (03/02/2018)   Physical deconditioning 10/24/2020   Pneumonia    used to get it twice/year; stopped after I had hysterectomy" (03/02/2018)   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   Senile purpura (HCC) 09/04/2021   Sinus tachycardia 03/09/2018   Sleep apnea    SOB (shortness of breath) 03/09/2018   Symptomatic  anemia 05/21/2018   Type II diabetes mellitus (HCC)    Past Surgical History:  Procedure Laterality Date   BACK SURGERY     BIOPSY  05/22/2018   Procedure: BIOPSY;  Surgeon: Kathi Der, MD;  Location: [facility];  Service: Gastroenterology;;   CARDIAC CATHETERIZATION  12/2017   "before OHS"   CHOLECYSTECTOMY OPEN  1982   CORONARY ARTERY BYPASS GRAFT  01/19/2018   "CABG X3; in [place]"   CORONARY STENT INTERVENTION N/A 03/02/2018   Procedure: CORONARY STENT INTERVENTION;  Surgeon: Kathleene Hazel, MD;  Location: [facility];  Service: Cardiovascular;  Laterality: N/A;   DILATION AND CURETTAGE OF UTERUS     ESOPHAGOGASTRODUODENOSCOPY (EGD) WITH PROPOFOL N/A 05/22/2018   Procedure: ESOPHAGOGASTRODUODENOSCOPY (EGD) WITH PROPOFOL;  Surgeon: Kathi Der, MD;  Location: [facility];  Service: Gastroenterology;  Laterality: N/A;   ESOPHAGOGASTRODUODENOSCOPY (EGD) WITH PROPOFOL N/A 05/24/2018   Procedure: ESOPHAGOGASTRODUODENOSCOPY (EGD) WITH PROPOFOL;  Surgeon: Kathi Der, MD;  Location: [facility];  Service: Gastroenterology;  Laterality: N/A;   EYE SURGERY Right    "no  sight in that eye"   KNEE ARTHROSCOPY Right X 4   LEFT HEART CATH AND CORS/GRAFTS ANGIOGRAPHY N/A 03/02/2018   Procedure: LEFT HEART CATH AND CORS/GRAFTS ANGIOGRAPHY;  Surgeon: Kathleene Hazel, MD;  Location: [facility];  Service: Cardiovascular;  Laterality: N/A;   LEFT HEART CATH AND CORS/GRAFTS ANGIOGRAPHY N/A 06/13/2020   Procedure: LEFT HEART CATH AND CORS/GRAFTS ANGIOGRAPHY;  Surgeon: Kathleene Hazel, MD;  Location: [facility];  Service: Cardiovascular;  Laterality: N/A;   LUMBAR DISC SURGERY     REDUCTION MAMMAPLASTY Bilateral X 2   SCHLEROTHERAPY  05/24/2018   Procedure: SCHLEROTHERAPY;  Surgeon: Kathi Der, MD;  Location: [facility];  Service: Gastroenterology;;   TONSILLECTOMY  1953   VAGINAL HYSTERECTOMY  1970   Social History   Tobacco Use   Smoking status: Former    Current packs/day: 0.00    Average packs/day: 0.1 packs/day for 3.0 years (0.4 ttl pk-yrs)    Types: Cigarettes    Start date: 84    Quit date: 1963    Years since quitting: 62.1   Smokeless tobacco: Never  Vaping Use   Vaping status: Never Used  Substance Use Topics   Alcohol use: Not Currently   Drug use: Never   Social History   Socioeconomic History   Marital status: Married    Spouse name: Not on file   Number of children: Not on file   Years of education: Not on file   Highest education level: Associate degree: academic program  Occupational History   Not on file  Tobacco Use   Smoking status: Former    Current packs/day: 0.00    Average packs/day: 0.1 packs/day for 3.0 years (0.4 ttl pk-yrs)    Types: Cigarettes    Start date: 57    Quit date: 1963    Years since quitting: 62.1   Smokeless tobacco: Never  Vaping Use   Vaping status: Never Used  Substance and Sexual Activity   Alcohol use: Not Currently   Drug use: Never   Sexual activity: Not on file  Other Topics Concern   Not on file  Social History Narrative   Not on file   Social  Drivers of Health   Financial Resource Strain: Low Risk  (01/04/2024)   Overall Financial Resource Strain (CARDIA)    Difficulty of Paying Living Expenses: Not hard at all  Food Insecurity:  No Food Insecurity (01/04/2024)   Hunger Vital Sign    Worried About Running Out of Food in the Last Year: Never true    Ran Out of Food in the Last Year: Never true  Transportation Needs: No Transportation Needs (01/04/2024)   PRAPARE - Administrator, Civil Service (Medical): No    Lack of Transportation (Non-Medical): No  Physical Activity: Inactive (07/09/2023)   Exercise Vital Sign    Days of Exercise per Week: 0 days    Minutes of Exercise per Session: 0 min  Stress: No Stress Concern Present (07/09/2023)   [employer] of Occupational Health - Occupational Stress Questionnaire    Feeling of Stress : Not at all  Social Connections: Unknown (01/04/2024)   Social Connection and Isolation Panel [NHANES]    Frequency of Communication with Friends and Family: More than three times a week    Frequency of Social Gatherings with Friends and Family: Once a week    Attends Religious Services: Patient declined    Database administrator or Organizations: No    Attends Banker Meetings: Never    Marital Status: Married  Catering manager Violence: Not At Risk (07/09/2023)   Humiliation, Afraid, Rape, and Kick questionnaire    Fear of Current or Ex-Partner: No    Emotionally Abused: No    Physically Abused: No    Sexually Abused: No   Family Status  Relation Name Status   Mother  Deceased   Father  Deceased   Sister  Alive   Brother  Alive   Daughter  Alive   MGM  Deceased   MGF  Deceased   PGM  Deceased   PGF  Deceased   Daughter  Estate manager/land agent   Daughter  Alive   Daughter  Alive  No partnership data on file   Family History  Problem Relation Age of Onset   Heart Problems Mother    Diabetes Father    Kidney disease Sister    Heart disease Brother     Hypertension Brother    Kidney disease Brother    Hypertension Daughter    Hypertension Daughter    Hypertension Daughter    Hypertension Daughter    Allergies  Allergen Reactions   Methylprednisolone     Severe cramps legs and arms   Keflex [Cephalexin] Hives   Ketorolac Swelling   Morphine And Codeine Itching   Novocain [Procaine] Swelling   Ramipril Other (See Comments)   Septra [Sulfamethoxazole-Trimethoprim] Nausea And Vomiting      Review of Systems  Constitutional:  Negative for chills, fever and malaise/fatigue.  HENT:  Negative for congestion and hearing loss.   Eyes:  Negative for discharge.  Respiratory:  Negative for cough, sputum production and shortness of breath.   Cardiovascular:  Negative for chest pain, palpitations and leg swelling.  Gastrointestinal:  Negative for abdominal pain, blood in stool, constipation, diarrhea, heartburn, nausea and vomiting.  Genitourinary:  Negative for dysuria, frequency, hematuria and urgency.  Musculoskeletal:  Negative for back pain, falls and myalgias.  Skin:  Negative for rash.  Neurological:  Negative for dizziness, sensory change, loss of consciousness, weakness and headaches.  Endo/Heme/Allergies:  Negative for environmental allergies. Does not bruise/bleed easily.  Psychiatric/Behavioral:  Negative for depression and suicidal ideas. The patient is not nervous/anxious and does not have insomnia.       Objective:     BP 110/70 (BP Location: Left Arm, Patient Position: Sitting)  Pulse 92   Temp 98.1 F (36.7 C) (Oral)   Resp 18   Ht 5\' 2"  (1.575 m)   Wt 135 lb 6.4 oz (61.4 kg)   SpO2 97%   BMI 24.76 kg/m  BP Readings from Last 3 Encounters:  01/05/24 110/70  01/05/24 110/70  09/29/23 128/76   Wt Readings from Last 3 Encounters:  01/05/24 135 lb (61.2 kg)  01/05/24 135 lb 6.4 oz (61.4 kg)  09/29/23 137 lb (62.1 kg)   SpO2 Readings from Last 3 Encounters:  01/05/24 97%  09/29/23 98%  08/12/23 94%       Physical Exam Vitals and nursing note reviewed.  Constitutional:      General: She is not in acute distress.    Appearance: Normal appearance. She is well-developed.  HENT:     Head: Normocephalic and atraumatic.  Eyes:     General: No scleral icterus.       Right eye: No discharge.        Left eye: No discharge.  Cardiovascular:     Rate and Rhythm: Normal rate and regular rhythm.     Heart sounds: No murmur heard. Pulmonary:     Effort: Pulmonary effort is normal. No respiratory distress.     Breath sounds: Normal breath sounds.  Musculoskeletal:     Right shoulder: Tenderness present. Decreased range of motion. Decreased strength.     Cervical back: Normal range of motion and neck supple.     Right lower leg: No edema.     Left lower leg: No edema.  Skin:    General: Skin is warm and dry.  Neurological:     Mental Status: She is alert and oriented to person, place, and time.  Psychiatric:        Mood and Affect: Mood normal.        Behavior: Behavior normal.        Thought Content: Thought content normal.        Judgment: Judgment normal.      No results found for any visits on 01/05/24.  Last CBC Lab Results  Component Value Date   WBC 6.8 12/12/2022   HGB 14.1 12/12/2022   HCT 42.8 12/12/2022   MCV 94.3 12/12/2022   MCH 31.1 12/12/2022   RDW 14.1 12/12/2022   PLT 215 12/12/2022   Last metabolic panel Lab Results  Component Value Date   GLUCOSE 130 (H) 09/29/2023   NA 141 09/29/2023   K 4.6 09/29/2023   CL 107 09/29/2023   CO2 24 09/29/2023   BUN 34 (H) 09/29/2023   CREATININE 1.18 09/29/2023   GFR 42.90 (L) 09/29/2023   CALCIUM 9.1 09/29/2023   PHOS 4.3 05/23/2018   PROT 6.5 09/29/2023   ALBUMIN 4.0 09/29/2023   LABGLOB 2.7 04/29/2018   AGRATIO 1.4 04/29/2018   BILITOT 0.6 09/29/2023   ALKPHOS 78 09/29/2023   AST 15 09/29/2023   ALT 12 09/29/2023   ANIONGAP 9 12/12/2022   Last lipids Lab Results  Component Value Date   CHOL 112  09/29/2023   HDL 58.20 09/29/2023   LDLCALC 31 09/29/2023   LDLDIRECT 30 09/08/2019   TRIG 114.0 09/29/2023   CHOLHDL 2 09/29/2023   Last hemoglobin A1c Lab Results  Component Value Date   HGBA1C 7.0 (H) 09/29/2023   Last thyroid functions No results found for: "TSH", "T3TOTAL", "T4TOTAL", "THYROIDAB"    The ASCVD Risk score (Arnett DK, et al., 2019) failed to calculate for the following reasons:  The 2019 ASCVD risk score is only valid for ages 55 to 14   Risk score cannot be calculated because patient has a medical history suggesting prior/existing ASCVD    Assessment & Plan:   Problem List Items Addressed This Visit   None Visit Diagnoses       Acute pain of right shoulder    -  Primary   Relevant Orders   Ambulatory referral to Sports Medicine   DG Shoulder Right     Assessment and Plan    Right Shoulder Pain Experiencing right shoulder pain for weeks, radiating to the elbow with swelling and clicking sounds. No trauma history. Possible arthritis or bursitis from repetitive motion. Discussed cortisone injection benefits and risks, including infection and temporary pain increase. Prefers to avoid injections but open to further evaluation. Order x-ray of the right shoulder. Refer to sports medicine for further evaluation and possible cortisone injection. Consider ultrasound of the shoulder joint.  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 Continue current management and monitor symptoms.  General Health Maintenance Physical examination scheduled for next week. Ensure completion.  Follow-up Follow up with sports medicine if an appointment is available today. Follow up with primary care physician after x-ray results.        No follow-ups on file.    Donato Schultz, DO

## 2024-01-05 NOTE — Progress Notes (Unsigned)
CHIEF COMPLAINT: No chief complaint on file.  _____________________________________________________________ SUBJECTIVE  HPI  Pt is a 83 y.o. female here for evaluation of acute right shoulder pain Seen in PCP office today, sent to sports medicine clinic after XR for immediate referral. Note reviewed, no exam/HPI on file at time of review and today's visit  Ongoing for a couple of weeks Inciting event: none  No fall/acute trauma  States she can't lift it past 80 degrees Surgery in shoulder 20 years ago; rotator cuff surgery Primarily located:  Elbow back to back up to the neck  No burning, aching sensation, occasional clicking Radiating: from the elbow up to her shoulder, to her neck Numbness/tingling: none Exacerbated by:   Favors R side of the rolling walker compared to her left due to a bad knee Therapies tried so far: tylenol  Medical history includes gout, carpal tunnel, hypertension, type 2 diabetes, cardiomyopathy, kidney stones, hyperlipidemia, complex cardiac history, see past medical history list  ------------------------------------------------------------------------------------------------------ Past Medical History:  Diagnosis Date   Anemia    Anemia due to chronic blood loss 02/05/2021   Arthritis    "hands, arms, all over the place" (03/02/2018)   Bilateral carpal tunnel syndrome 10/11/2019   Bronchospasm 03/28/2018   CAD (coronary artery disease) 02/08/2018   Left heart cath 01/13/18: 40% distal LMCAS 90% proximal LAD  LCF with 90% proximal ramus stenosis, diffuse RCA, 60% distal and 90% proximal PDA stenosis   CAD in native artery    a. s/p CABG in 01/2018. b. Recurrent CP 02/2018 -> cath showing early graft closure SVG-diagonal, received DES to D1.   CAD/s/p CABG Mar 2019 w/ DES to vein graft Apr 2019 05/21/2018   Cameron ulcer, acute 05/27/2018   Carpal tunnel syndrome, bilateral 12/28/2018   Chest pain 02/05/2018   Chronic combined systolic and diastolic  CHF (congestive heart failure) (HCC)    a. LVEF 40-45% by echo 02/2018.   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 s/p CABG in 01/2018. b. Recurrent CP 02/2018 -> cath showing early graft closure SVG-diagonal, received DES to D1.   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   Essential hypertension 05/21/2018   Falls frequently 09/04/2021   Gastric ulcer, unspecified as acute or chronic, without hemorrhage or perforation 05/27/2018   Gastroesophageal reflux disease 05/21/2018   Glass eye    right eye   Gout    "on daily RX" (03/02/2018)   Heme positive stool 05/21/2018   History of anemia 02/05/2021   History of cardiomyopathy 10/08/2022   History of cholecystectomy 02/05/2021   History of kidney stones    HLD (hyperlipidemia) 05/21/2018   Hyperlipidemia    Hypertension    Hypertensive heart disease    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   Kidney cysts    Kidney disease    Left ventricular aneurysm    Migraine    "used to get them twice/week; haven't had them in a long time" (03/02/2018)   Mixed dyslipidemia 10/08/2022   NSTEMI (non-ST elevated myocardial infarction) (HCC) 03/01/2018   Obesity (BMI 30-39.9) 07/25/2020   On home oxygen therapy    "3L  at night and prn during the day" (03/02/2018)   Physical deconditioning 10/24/2020   Pneumonia    used to get it twice/year; stopped after I had hysterectomy" (03/02/2018)   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   Senile purpura (HCC) 09/04/2021   Sinus tachycardia 03/09/2018   Sleep apnea    SOB (shortness of breath) 03/09/2018   Symptomatic anemia 05/21/2018   Type II diabetes mellitus  (HCC)     Past Surgical History:  Procedure Laterality Date   BACK SURGERY     BIOPSY  05/22/2018   Procedure: BIOPSY;  Surgeon: Kathi Der, MD;  Location: [facility];  Service: Gastroenterology;;   CARDIAC CATHETERIZATION  12/2017   "before [facility]"   CHOLECYSTECTOMY OPEN  1982   CORONARY ARTERY BYPASS GRAFT  01/19/2018   "CABG X3; in "   CORONARY STENT INTERVENTION N/A 03/02/2018   Procedure: CORONARY STENT INTERVENTION;  Surgeon: Kathleene Hazel, MD;  Location: [facility];  Service: Cardiovascular;  Laterality: N/A;   DILATION AND CURETTAGE OF UTERUS     ESOPHAGOGASTRODUODENOSCOPY (EGD) WITH PROPOFOL N/A 05/22/2018   Procedure: ESOPHAGOGASTRODUODENOSCOPY (EGD) WITH PROPOFOL;  Surgeon: Kathi Der, MD;  Location: [facility];  Service: Gastroenterology;  Laterality: N/A;   ESOPHAGOGASTRODUODENOSCOPY (EGD) WITH PROPOFOL N/A 05/24/2018   Procedure: ESOPHAGOGASTRODUODENOSCOPY (EGD) WITH PROPOFOL;  Surgeon: Kathi Der, MD;  Location: [facility];  Service: Gastroenterology;  Laterality: N/A;   EYE SURGERY Right    "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"   KNEE ARTHROSCOPY Right X 4   LEFT HEART CATH AND CORS/GRAFTS ANGIOGRAPHY N/A 03/02/2018   Procedure: LEFT HEART CATH AND CORS/GRAFTS ANGIOGRAPHY;  Surgeon: Kathleene Hazel, MD;  Location: [facility];  Service: Cardiovascular;  Laterality: N/A;   LEFT HEART CATH AND CORS/GRAFTS ANGIOGRAPHY N/A 06/13/2020   Procedure: LEFT HEART CATH AND CORS/GRAFTS ANGIOGRAPHY;  Surgeon: Kathleene Hazel, MD;  Location: [facility];  Service: Cardiovascular;  Laterality: N/A;   LUMBAR DISC SURGERY     REDUCTION MAMMAPLASTY Bilateral X 2   SCHLEROTHERAPY  05/24/2018   Procedure: SCHLEROTHERAPY;  Surgeon: Kathi Der, MD;  Location: [facility];  Service: Gastroenterology;;   TONSILLECTOMY  1953   VAGINAL HYSTERECTOMY  1970      Outpatient Encounter Medications as of 01/05/2024  Medication Sig    acetaminophen (TYLENOL) 500 MG tablet Take 1,000 mg by mouth every 6 (six) hours as needed for headache.    allopurinol (ZYLOPRIM) 100 MG tablet TAKE 1 TABLET(100 MG) BY MOUTH DAILY   atorvastatin (LIPITOR) 10 MG tablet Take 1 tablet (10 mg total) by mouth daily.   clopidogrel (PLAVIX) 75 MG tablet TAKE 1 TABLET(75 MG) BY MOUTH DAILY   empagliflozin (JARDIANCE) 25 MG TABS tablet Take 1 tablet (25 mg total) by mouth daily.   ENTRESTO 49-51 MG TAKE 1 TABLET BY MOUTH TWICE DAILY   furosemide (LASIX) 20 MG tablet TAKE 1 TABLET BY MOUTH DAILY. TAKE ADDITIONAL TABLET ON MONDAY, WEDNESDAY AND FRIDAY   metFORMIN (GLUCOPHAGE-XR) 500 MG 24 hr tablet Take 2 tablets (1,000 mg total) by mouth daily with breakfast.   Multiple Vitamin (MULTIVITAMIN WITH MINERALS) TABS tablet Take 1 tablet by mouth daily.   nitroGLYCERIN (NITROSTAT) 0.4 MG SL tablet Place 1 tablet (0.4 mg total) under the tongue every 5 (five) minutes x 3 doses as needed for chest pain.   pantoprazole (PROTONIX) 20 MG tablet TAKE 1 TABLET(20 MG) BY MOUTH DAILY   Pseudoeph-Doxylamine-DM-APAP (NYQUIL PO) Take 1 Dose by mouth at  bedtime as needed (congestion).    spironolactone (ALDACTONE) 25 MG tablet TAKE 1/2 TABLET(12.5 MG) BY MOUTH DAILY   venlafaxine XR (EFFEXOR-XR) 75 MG 24 hr capsule Take 1 capsule (75 mg total) by mouth daily with breakfast.   vitamin C (ASCORBIC ACID) 500 MG tablet Take 500 mg by mouth 2 (two) times daily.   No facility-administered encounter medications on file as of 01/05/2024.    ------------------------------------------------------------------------------------------------------  _____________________________________________________________ OBJECTIVE  PHYSICAL EXAM  Today's Vitals   01/05/24 1507  BP: 110/70  Pulse: 92  Weight: 135 lb (61.2 kg)  Height: 5\' 2"  (1.575 m)   Body mass index is 24.69 kg/m.   reviewed  General: A+Ox3, no acute distress, well-nourished, appropriate affect CV: pulses 2+  regular, nondiaphoretic, no peripheral edema, cap refill <2sec Lungs: no audible wheezing, non-labored breathing, bilateral chest rise/fall, nontachypneic Skin: warm, well-perfused, non-icteric, no susp lesions or rashes Neuro:  Sensation intact, muscle tone wnl, no atrophy Psych: no signs of depression or anxiety MSK:  R Shoulder:  No deformity, swelling or muscle wasting No scapular winging FF 80, abd 30, +drop arm TTP AC, biceps groove, anterolateral humerus, subacromial space, diffuse trapezius musculature NTTP over the [place], clavicle, coracoid, scap spine, cervical spine +neer, +hawkins, +empty can, -scarf test provocative of posterior pain, +hornblower, +subscap liftoff, +speeds, +obriens Negative Spurling's test bilat Limited neck extension, R neck rotation  R shoulder XR reviewed, revealing fairly extensive ACJ degenerative changes, signs of rotator cuff tear arthritis, humeral head osteophytic changes, signs of R elbow OA identified on axillary view, osteopenic appearance of bone  _____________________________________________________________ ASSESSMENT/PLAN Diagnoses and all orders for this visit:  Acute pain of right shoulder -     Ambulatory referral to Home Health  Osteoarthritis of right shoulder, unspecified osteoarthritis type -     Ambulatory referral to Home Health  Elbow arthritis  Other orders -     triamcinolone acetonide (KENALOG-40) injection 40 mg   XR and exam findings reviewed with patient, final read pending. Multifactorial etiology to acute on chronic pain. Options discussed for acute pain mgmt. Pt would like to proceed with SAB CSI Subacromial Bursa Injection Site: right  After PARQ discussed and verbal consent given, the site was cleaned with alcohol and chlorhexidine. Steroid injection was performed using sterile technique with 4mL 1% lidocaine, 1mL 40mg /mL kenalog and 25G 1.5in needle. This was well tolerated and resulted in some relief. Dressing  placed and post injection instructions were given including a discussion of likely return of pain today after the anesthetic wears off (with the possibility of worsened pain) until the steroid starts to work in 1-3 days. Call or return to clinic prn if these symptoms worsen or fail to improve as anticipated.  Is established with home health PT, requested additional referral for shoulder concerns, which was placed today. All questions answered. Return precautions discussed, anticipate f/u in 3-4 weeks, sooner as needed for refractory, worsening, or new symptoms. May benefit from intraarticular shoulder joint CSI if subacromial CSI does not offer much relief. Patient verbalized understanding and is in agreement with plan  Electronically signed by: Burna Forts, MD 01/05/2024 3:04 PM

## 2024-01-25 ENCOUNTER — Encounter: Payer: Self-pay | Admitting: Family Medicine

## 2024-02-24 ENCOUNTER — Other Ambulatory Visit: Payer: Self-pay | Admitting: Family Medicine

## 2024-02-24 ENCOUNTER — Other Ambulatory Visit: Payer: Self-pay | Admitting: Cardiology

## 2024-02-24 DIAGNOSIS — I10 Essential (primary) hypertension: Secondary | ICD-10-CM

## 2024-02-24 DIAGNOSIS — I214 Non-ST elevation (NSTEMI) myocardial infarction: Secondary | ICD-10-CM

## 2024-02-24 DIAGNOSIS — I5032 Chronic diastolic (congestive) heart failure: Secondary | ICD-10-CM

## 2024-03-15 ENCOUNTER — Other Ambulatory Visit: Payer: Self-pay | Admitting: Family Medicine

## 2024-03-29 ENCOUNTER — Ambulatory Visit (INDEPENDENT_AMBULATORY_CARE_PROVIDER_SITE_OTHER): Payer: Medicare Other | Admitting: Family Medicine

## 2024-03-29 ENCOUNTER — Encounter: Payer: Self-pay | Admitting: Family Medicine

## 2024-03-29 ENCOUNTER — Ambulatory Visit: Payer: Self-pay | Admitting: Family Medicine

## 2024-03-29 ENCOUNTER — Other Ambulatory Visit: Payer: Self-pay

## 2024-03-29 VITALS — BP 128/74 | HR 73 | Temp 98.0°F | Resp 16 | Ht 62.0 in | Wt 132.6 lb

## 2024-03-29 DIAGNOSIS — E669 Obesity, unspecified: Secondary | ICD-10-CM | POA: Diagnosis not present

## 2024-03-29 DIAGNOSIS — L989 Disorder of the skin and subcutaneous tissue, unspecified: Secondary | ICD-10-CM

## 2024-03-29 DIAGNOSIS — M1A9XX0 Chronic gout, unspecified, without tophus (tophi): Secondary | ICD-10-CM | POA: Diagnosis not present

## 2024-03-29 DIAGNOSIS — Z7984 Long term (current) use of oral hypoglycemic drugs: Secondary | ICD-10-CM | POA: Diagnosis not present

## 2024-03-29 DIAGNOSIS — E1169 Type 2 diabetes mellitus with other specified complication: Secondary | ICD-10-CM | POA: Diagnosis not present

## 2024-03-29 DIAGNOSIS — Z6824 Body mass index (BMI) 24.0-24.9, adult: Secondary | ICD-10-CM | POA: Diagnosis not present

## 2024-03-29 DIAGNOSIS — E876 Hypokalemia: Secondary | ICD-10-CM

## 2024-03-29 DIAGNOSIS — R809 Proteinuria, unspecified: Secondary | ICD-10-CM

## 2024-03-29 DIAGNOSIS — E875 Hyperkalemia: Secondary | ICD-10-CM

## 2024-03-29 LAB — COMPREHENSIVE METABOLIC PANEL WITH GFR
ALT: 14 U/L (ref 0–35)
AST: 17 U/L (ref 0–37)
Albumin: 4.1 g/dL (ref 3.5–5.2)
Alkaline Phosphatase: 76 U/L (ref 39–117)
BUN: 46 mg/dL — ABNORMAL HIGH (ref 6–23)
CO2: 27 meq/L (ref 19–32)
Calcium: 9.3 mg/dL (ref 8.4–10.5)
Chloride: 103 meq/L (ref 96–112)
Creatinine, Ser: 1.33 mg/dL — ABNORMAL HIGH (ref 0.40–1.20)
GFR: 37.03 mL/min — ABNORMAL LOW (ref >60.00)
Glucose, Bld: 145 mg/dL — ABNORMAL HIGH (ref 70–99)
Potassium: 5.3 meq/L — ABNORMAL HIGH (ref 3.5–5.1)
Sodium: 138 meq/L (ref 135–145)
Total Bilirubin: 0.6 mg/dL (ref 0.2–1.2)
Total Protein: 6.7 g/dL (ref 6.0–8.3)

## 2024-03-29 LAB — LIPID PANEL
Cholesterol: 122 mg/dL (ref 0–200)
HDL: 61.4 mg/dL (ref >39.00)
LDL Cholesterol: 36 mg/dL (ref 0–99)
NonHDL: 60.29
Total CHOL/HDL Ratio: 2
Triglycerides: 121 mg/dL (ref 0.0–149.0)
VLDL: 24.2 mg/dL (ref 0.0–40.0)

## 2024-03-29 LAB — MICROALBUMIN / CREATININE URINE RATIO
Creatinine,U: 33.8 mg/dL
Microalb Creat Ratio: 454 mg/g — ABNORMAL HIGH (ref 0.0–30.0)
Microalb, Ur: 15.3 mg/dL — ABNORMAL HIGH (ref 0.0–1.9)

## 2024-03-29 LAB — URIC ACID: Uric Acid, Serum: 3.7 mg/dL (ref 2.4–7.0)

## 2024-03-29 LAB — HEMOGLOBIN A1C: Hgb A1c MFr Bld: 7 % — ABNORMAL HIGH (ref 4.6–6.5)

## 2024-03-29 NOTE — Progress Notes (Signed)
 Subjective:   Chief Complaint  Patient presents with   Follow-up    Follow up    Margaret Ward is a 83 y.o. female here for follow-up of diabetes.   Margaret Ward's self monitored glucose range is mid-low 100's.  Patient denies hypoglycemic reactions. She checks her glucose levels intermittently. Patient does not require insulin .   Medications include: Jardiance  25 mg/d, Metformin  XR 1000 mg/d Diet is OK.  Exercise: cycling  Gout Patient has a history of gout.  She takes allopurinol  100 mg daily.  She reports compliance and not dorsiflex.  No recent flares of the head 6 months.  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 s/p CABG in 01/2018. b. Recurrent CP 02/2018 -> cath showing early graft closure SVG-diagonal, received DES to D1.   CAD/s/p CABG Mar 2019 w/ DES to vein graft Apr 2019 05/21/2018   Cameron ulcer, acute 05/27/2018   Carpal tunnel syndrome, bilateral 12/28/2018   Chest pain 02/05/2018   Chronic combined systolic and diastolic CHF (congestive heart failure) (HCC)    a. LVEF 40-45% by echo 02/2018.   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 s/p CABG in 01/2018. b. Recurrent CP 02/2018 -> cath showing early graft closure SVG-diagonal, received DES to D1.   Diabetes  mellitus (HCC) 05/21/2018   Diabetes mellitus type 2 in obese 04/23/2018   Essential hypertension 05/21/2018   Falls frequently 09/04/2021   Gastric ulcer, unspecified as acute or chronic, without hemorrhage or perforation 05/27/2018   Gastroesophageal reflux disease 05/21/2018   Glass eye    right eye   Gout    "on daily RX" (03/02/2018)   Heme positive stool 05/21/2018   History of anemia 02/05/2021   History of cardiomyopathy 10/08/2022   History of cholecystectomy 02/05/2021   History of kidney stones    HLD (hyperlipidemia) 05/21/2018   Hyperlipidemia    Hypertension    Hypertensive heart disease    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   Kidney cysts    Kidney disease    Left ventricular aneurysm    Migraine    "used to get them twice/week; haven't had them in a long time" (03/02/2018)   Mixed dyslipidemia 10/08/2022   NSTEMI (non-ST elevated myocardial infarction) (HCC) 03/01/2018   Obesity (BMI 30-39.9) 07/25/2020   On home oxygen therapy    "3L at night and prn during the day" (03/02/2018)   Physical deconditioning 10/24/2020   Pneumonia    used to get it twice/year; stopped after I had hysterectomy" (03/02/2018)   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   Senile purpura (HCC) 09/04/2021   Sinus tachycardia 03/09/2018   Sleep apnea    SOB (shortness of breath) 03/09/2018  Symptomatic anemia 05/21/2018   Type II diabetes mellitus (HCC)      Related testing: Retinal exam: Done Pneumovax: done  Objective:  BP 128/74 (BP Location: Left Arm, Patient Position: Sitting)   Pulse 73   Temp 98 F (36.7 C) (Oral)   Resp 16   Ht 5\' 2"  (1.575 m)   Wt 132 lb 9.6 oz (60.1 kg)   SpO2 95%   BMI 24.25 kg/m  General:  Well developed, well nourished, in no apparent distress Skin: superficial wearing of skin between R digits 3/4 on foot; otherwise no ext lesions on feet; warm, no pallor or diaphoresis on other exposed skin surfaces Head:  Normocephalic,  atraumatic Eyes:  Pupils equal and round, sclera anicteric without injection  Lungs:  CTAB, no access msc use Cardio:  RRR, no bruits, no LE edema Neuro:  Sensation intact to pinprick on feet Psych: Age appropriate judgment and insight  Assessment:   Type 2 diabetes mellitus with obesity (HCC) - Plan: Comprehensive metabolic panel with GFR, Hemoglobin A1c, Lipid panel, Microalbumin / creatinine urine ratio  Chronic gout without tophus, unspecified cause, unspecified site - Plan: Uric acid  Skin lesion   Plan:   Chronic, stable.  Continue Jardiance  25 mg daily, metformin  XR 1000 mg daily.  Counseled on diet and exercise. Chronic, stable.  Continue allopurinol  100 mg daily. It appears she has kissing ulcers between her 3rd and 4th digit on the right foot.  TAO, bandaging, keep clean and dry.  Send message if worsening and we will consider referral to the wound clinic.  Interestingly, she was unaware this was present. F/u in 6 mo. The patient voiced understanding and agreement to the plan.  Shellie Dials Ailey, DO 03/29/24 9:07 AM

## 2024-03-29 NOTE — Patient Instructions (Addendum)
 Give us  2-3 business days to get the results of your labs back.   Keep the diet clean and stay active.  For your toes (R side between 3rd/4th toes) When you do wash it, use only soap and water. Do not vigorously scrub. Apply triple antibiotic ointment (like Neosporin) twice daily. Keep the area clean and dry.   Things to look out for: increasing pain not relieved by acetaminophen , fevers, spreading redness, drainage of pus, or foul odor.  Let us  know if you need anything.

## 2024-03-30 ENCOUNTER — Telehealth: Payer: Self-pay

## 2024-03-30 NOTE — Telephone Encounter (Signed)
 How much potassium is she taking? I don't have her on anything. If she is, let's stop it for now as she is on 2 other meds which can increase potassium. Sometimes it can be elevated due to the trauma from the needlestick.

## 2024-03-30 NOTE — Telephone Encounter (Signed)
 Copied from CRM [id number]. Topic: Clinical - Medication Question >> Mar 30, 2024 11:16 AM Caliyah H wrote: Reason for CRM: Patient's daughter called regarding her mother's potassium levels being high. She stated the patient was prescribed a diuretic along with potassium and is concerned that the patient may be taking too much potassium. She asked if the potassium dosage should be reduced. She mentioned that the office can either call her or send a message through portal, which she monitors.

## 2024-03-30 NOTE — Telephone Encounter (Signed)
 Sent Pt mychart message asking her about the potassium med, and ask her to stop it for now.

## 2024-04-01 ENCOUNTER — Other Ambulatory Visit (INDEPENDENT_AMBULATORY_CARE_PROVIDER_SITE_OTHER)

## 2024-04-01 DIAGNOSIS — R809 Proteinuria, unspecified: Secondary | ICD-10-CM

## 2024-04-01 DIAGNOSIS — E875 Hyperkalemia: Secondary | ICD-10-CM

## 2024-04-01 LAB — BASIC METABOLIC PANEL WITH GFR
BUN/Creatinine Ratio: 32 (calc) — ABNORMAL HIGH (ref 6–22)
BUN: 40 mg/dL — ABNORMAL HIGH (ref 7–25)
CO2: 23 mmol/L (ref 20–32)
Calcium: 9 mg/dL (ref 8.6–10.4)
Chloride: 107 mmol/L (ref 98–110)
Creat: 1.24 mg/dL — ABNORMAL HIGH (ref 0.60–0.95)
Glucose, Bld: 116 mg/dL — ABNORMAL HIGH (ref 65–99)
Potassium: 4.8 mmol/L (ref 3.5–5.3)
Sodium: 140 mmol/L (ref 135–146)
eGFR: 43 mL/min/{1.73_m2} — ABNORMAL LOW (ref >=60)

## 2024-04-01 NOTE — Addendum Note (Signed)
 Addended by: Marigene Shoulder on: 04/01/2024 02:58 PM   Modules accepted: Orders

## 2024-04-02 LAB — MICROALBUMIN / CREATININE URINE RATIO
Creatinine, Urine: 22 mg/dL (ref 20–275)
Microalb Creat Ratio: 318 mg/g{creat} — ABNORMAL HIGH (ref <30)
Microalb, Ur: 7 mg/dL

## 2024-04-28 DIAGNOSIS — H524 Presbyopia: Secondary | ICD-10-CM | POA: Diagnosis not present

## 2024-04-28 DIAGNOSIS — Z961 Presence of intraocular lens: Secondary | ICD-10-CM | POA: Diagnosis not present

## 2024-04-28 DIAGNOSIS — H43812 Vitreous degeneration, left eye: Secondary | ICD-10-CM | POA: Diagnosis not present

## 2024-04-28 DIAGNOSIS — Z970 Presence of artificial eye: Secondary | ICD-10-CM | POA: Diagnosis not present

## 2024-04-28 DIAGNOSIS — E119 Type 2 diabetes mellitus without complications: Secondary | ICD-10-CM | POA: Diagnosis not present

## 2024-04-28 LAB — HM DIABETES EYE EXAM

## 2024-05-10 ENCOUNTER — Ambulatory Visit: Admitting: Cardiology

## 2024-05-25 ENCOUNTER — Other Ambulatory Visit: Payer: Self-pay | Admitting: Cardiology

## 2024-05-25 DIAGNOSIS — I10 Essential (primary) hypertension: Secondary | ICD-10-CM

## 2024-05-25 DIAGNOSIS — I5032 Chronic diastolic (congestive) heart failure: Secondary | ICD-10-CM

## 2024-05-25 DIAGNOSIS — I214 Non-ST elevation (NSTEMI) myocardial infarction: Secondary | ICD-10-CM

## 2024-06-07 ENCOUNTER — Ambulatory Visit: Attending: Cardiology | Admitting: Cardiology

## 2024-06-07 ENCOUNTER — Encounter: Payer: Self-pay | Admitting: Cardiology

## 2024-06-07 VITALS — BP 110/68 | HR 72 | Ht 62.0 in | Wt 132.1 lb

## 2024-06-07 DIAGNOSIS — I519 Heart disease, unspecified: Secondary | ICD-10-CM | POA: Insufficient documentation

## 2024-06-07 DIAGNOSIS — E782 Mixed hyperlipidemia: Secondary | ICD-10-CM | POA: Diagnosis not present

## 2024-06-07 DIAGNOSIS — I10 Essential (primary) hypertension: Secondary | ICD-10-CM | POA: Diagnosis not present

## 2024-06-07 DIAGNOSIS — I2510 Atherosclerotic heart disease of native coronary artery without angina pectoris: Secondary | ICD-10-CM | POA: Insufficient documentation

## 2024-06-07 DIAGNOSIS — Z951 Presence of aortocoronary bypass graft: Secondary | ICD-10-CM | POA: Diagnosis not present

## 2024-06-07 NOTE — Progress Notes (Signed)
 Cardiology Office Note:    Date:  06/07/2024   ID:  Margaret Ward, DOB 1941/06/05, MRN [id number]  PCP:  Frann Mabel Mt, DO  Cardiologist:  Jennifer JONELLE Crape, MD   Referring MD: Frann Mabel Mt*    ASSESSMENT:    1. Essential hypertension   2. Coronary artery disease involving native coronary artery of native heart without angina pectoris   3. CAD/s/p CABG Mar 2019 w/ DES to vein graft Apr 2019   4. S/P CABG x 3   5. Mixed dyslipidemia    PLAN:    In order of problems listed above:  Coronary artery disease: Secondary prevention stressed with the patient.  Importance of compliance with diet medication stressed and she vocalized understanding.  She was advised to walk on a regular basis to the best of her ability. Essential hypertension: Blood pressure is stable and diet was emphasized.  Lifestyle modification urged. Mixed dyslipidemia: On lipid-lowering medications followed by primary care.  Lipids are at goal. Renal insufficiency: Stable.  Followed by primary care.  I discussed the numbers with her at length and questions were answered to her satisfaction. Patient will be seen in follow-up appointment in 6 months or earlier if the patient has any concerns.    Medication Adjustments/Labs and Tests Ordered: Current medicines are reviewed at length with the patient today.  Concerns regarding medicines are outlined above.  Orders Placed This Encounter  Procedures   EKG 12-Lead   No orders of the defined types were placed in this encounter.    No chief complaint on file.    History of Present Illness:    Margaret Ward is a 83 y.o. female.  Patient has past medical history of coronary artery disease post CABG surgery in the remote past, essential hypertension, mixed dyslipidemia.  She has mild renal insufficiency.  She denies any chest pains orthopnea or PND.  She takes care of activities of daily living.  She ambulates with a walker.  At the time of  my evaluation, the patient is alert awake oriented and in no distress.  Past Medical History:  Diagnosis Date   Anemia    Anemia due to chronic blood loss 02/05/2021   Arthritis    hands, arms, all over the place (03/02/2018)   Bilateral carpal tunnel syndrome 10/11/2019   Bronchospasm 03/28/2018   CAD (coronary artery disease) 02/08/2018   Left heart cath 01/13/18: 40% distal LMCAS 90% proximal LAD  LCF with 90% proximal ramus stenosis, diffuse RCA, 60% distal and 90% proximal PDA stenosis   CAD in native artery    a. s/p CABG in 01/2018. b. Recurrent CP 02/2018 -> cath showing early graft closure SVG-diagonal, received DES to D1.   CAD/s/p CABG Mar 2019 w/ DES to vein graft Apr 2019 05/21/2018   Cameron ulcer, acute 05/27/2018   Carpal tunnel syndrome, bilateral 12/28/2018   Chest pain 02/05/2018   Chronic combined systolic and diastolic CHF (congestive heart failure) (HCC)    a. LVEF 40-45% by echo 02/2018.   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 s/p CABG in 01/2018. b. Recurrent CP 02/2018 -> cath showing early graft closure SVG-diagonal, received DES to  D1.   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   Essential hypertension 05/21/2018   Falls frequently 09/04/2021   Gastric ulcer, unspecified as acute or chronic, without hemorrhage or perforation 05/27/2018   Gastroesophageal reflux disease 05/21/2018   Glass eye    right eye   Gout    on daily RX (03/02/2018)   Heme positive stool 05/21/2018   History of anemia 02/05/2021   History of cardiomyopathy 10/08/2022   History of cholecystectomy 02/05/2021   History of kidney stones    HLD  (hyperlipidemia) 05/21/2018   Hyperlipidemia    Hypertension    Hypertensive heart disease    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   Kidney cysts    Kidney disease    Left ventricular aneurysm    Migraine    used to get them twice/week; haven't had them in a long time (03/02/2018)   Mixed dyslipidemia 10/08/2022   NSTEMI (non-ST elevated myocardial infarction) (HCC) 03/01/2018   Obesity (BMI 30-39.9) 07/25/2020   On home oxygen therapy    3L at night and prn during the day (03/02/2018)   Physical deconditioning 10/24/2020   Pneumonia    used to get it twice/year; stopped after I had hysterectomy (03/02/2018)   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   Senile purpura (HCC) 09/04/2021   Sinus tachycardia 03/09/2018   Sleep apnea    SOB (shortness of breath) 03/09/2018   Symptomatic anemia 05/21/2018   Type II diabetes mellitus (HCC)     Past Surgical History:  Procedure Laterality Date   BACK SURGERY     BIOPSY  05/22/2018   Procedure: BIOPSY;  Surgeon: Elicia Claw, MD;  Location: [facility];  Service: Gastroenterology;;   CARDIAC CATHETERIZATION  12/2017   before [facility]   CHOLECYSTECTOMY OPEN  1982   CORONARY ARTERY BYPASS GRAFT  01/19/2018   CABG X3; in [place]    CORONARY STENT INTERVENTION N/A 03/02/2018   Procedure: CORONARY STENT INTERVENTION;  Surgeon: Verlin Lonni BIRCH, MD;  Location: [facility];  Service: Cardiovascular;  Laterality: N/A;   DILATION AND CURETTAGE OF UTERUS     ESOPHAGOGASTRODUODENOSCOPY (EGD) WITH PROPOFOL  N/A 05/22/2018   Procedure: ESOPHAGOGASTRODUODENOSCOPY (EGD) WITH PROPOFOL ;  Surgeon: Elicia Claw, MD;  Location: [facility];  Service: Gastroenterology;  Laterality: N/A;   ESOPHAGOGASTRODUODENOSCOPY (EGD) WITH PROPOFOL  N/A 05/24/2018   Procedure: ESOPHAGOGASTRODUODENOSCOPY (EGD) WITH PROPOFOL ;  Surgeon: Elicia Claw, MD;  Location: [facility];  Service: Gastroenterology;  Laterality: N/A;   EYE  SURGERY Right    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   KNEE ARTHROSCOPY Right X 4   LEFT HEART CATH AND CORS/GRAFTS ANGIOGRAPHY N/A 03/02/2018   Procedure: LEFT HEART CATH AND CORS/GRAFTS ANGIOGRAPHY;  Surgeon: Verlin Lonni BIRCH, MD;  [facility]: [facility];  Service: Cardiovascular;  Laterality: N/A;   LEFT HEART CATH AND CORS/GRAFTS ANGIOGRAPHY N/A 06/13/2020   Procedure: LEFT HEART CATH AND CORS/GRAFTS ANGIOGRAPHY;  Surgeon: Verlin Lonni BIRCH, MD;  Location: [facility];  Service: Cardiovascular;  Laterality: N/A;   LUMBAR DISC SURGERY     REDUCTION MAMMAPLASTY Bilateral X 2   SCHLEROTHERAPY  05/24/2018   Procedure: SCHLEROTHERAPY;  Surgeon: Elicia Claw, MD;  Location: [facility];  Service: Gastroenterology;;   TONSILLECTOMY  1953   VAGINAL HYSTERECTOMY  1970    Current Medications: Current Meds  Medication Sig   acetaminophen  (TYLENOL ) 500 MG tablet Take 1,000 mg by mouth every 6 (six) hours as needed  for headache.    allopurinol  (ZYLOPRIM ) 100 MG tablet TAKE 1 TABLET(100 MG) BY MOUTH DAILY   atorvastatin  (LIPITOR) 10 MG tablet Take 1 tablet (10 mg total) by mouth daily.   clopidogrel  (PLAVIX ) 75 MG tablet TAKE 1 TABLET(75 MG) BY MOUTH DAILY   empagliflozin  (JARDIANCE ) 25 MG TABS tablet Take 1 tablet (25 mg total) by mouth daily.   furosemide  (LASIX ) 20 MG tablet TAKE 1 TABLET BY MOUTH DAILY. TAKE ADDITIONAL TABLET ON MONDAY, WEDNESDAY AND FRIDAY   metFORMIN  (GLUCOPHAGE -XR) 500 MG 24 hr tablet Take 2 tablets (1,000 mg total) by mouth daily with breakfast.   Multiple Vitamin (MULTIVITAMIN WITH MINERALS) TABS tablet Take 1 tablet by mouth daily.   nitroGLYCERIN  (NITROSTAT ) 0.4 MG SL tablet Place 1 tablet (0.4 mg total) under the tongue every 5 (five) minutes x 3 doses as needed for chest pain.   pantoprazole  (PROTONIX ) 20 MG tablet TAKE 1 TABLET(20 MG) BY MOUTH DAILY   Pseudoeph-Doxylamine-DM-APAP (NYQUIL PO) Take 1 Dose by mouth at bedtime as needed (congestion).     sacubitril -valsartan  (ENTRESTO ) 49-51 MG TAKE 1 TABLET BY MOUTH TWICE DAILY   spironolactone  (ALDACTONE ) 25 MG tablet TAKE 1/2 TABLET(12.5 MG) BY MOUTH DAILY   venlafaxine  XR (EFFEXOR -XR) 75 MG 24 hr capsule Take 1 capsule (75 mg total) by mouth daily with breakfast.   vitamin C  (ASCORBIC ACID ) 500 MG tablet Take 500 mg by mouth 2 (two) times daily.     Allergies:   Methylprednisolone, Keflex [cephalexin], Ketorolac, Morphine  and codeine , Novocain [procaine], Ramipril, and Septra [sulfamethoxazole-trimethoprim]   Social History   Socioeconomic History   Marital status: Married    Spouse name: Not on file   Number of children: Not on file   Years of education: Not on file   Highest education level: Associate degree: academic program  Occupational History   Not on file  Tobacco Use   Smoking status: Former    Current packs/day: 0.00    Average packs/day: 0.1 packs/day for 3.0 years (0.4 ttl pk-yrs)    Types: Cigarettes    Start date: 23    Quit date: 1963    Years since quitting: 62.5   Smokeless tobacco: Never  Vaping Use   Vaping status: Never Used  Substance and Sexual Activity   Alcohol use: Not Currently   Drug use: Never   Sexual activity: Not on file  Other Topics Concern   Not on file  Social History Narrative   Not on file   Social Drivers of Health   Financial Resource Strain: Low Risk  (01/04/2024)   Overall Financial Resource Strain (CARDIA)    Difficulty of Paying Living Expenses: Not hard at all  Food Insecurity: No Food Insecurity (01/04/2024)   Hunger Vital Sign    Worried About Running Out of Food in the Last Year: Never true    Ran Out of Food in the Last Year: Never true  Transportation Needs: No Transportation Needs (01/04/2024)   PRAPARE - Administrator, Civil Service (Medical): No    Lack of Transportation (Non-Medical): No  Physical Activity: Inactive (07/09/2023)   Exercise Vital Sign    Days of Exercise per Week: 0 days    Minutes  of Exercise per Session: 0 min  Stress: No Stress Concern Present (07/09/2023)   [employer] of Occupational Health - Occupational Stress Questionnaire    Feeling of Stress : Not at all  Social Connections: Unknown (01/04/2024)   Social Connection and Isolation Panel  Frequency of Communication with Friends and Family: More than three times a week    Frequency of Social Gatherings with Friends and Family: Once a week    Attends Religious Services: Patient declined    Database administrator or Organizations: No    Attends Engineer, structural: Never    Marital Status: Married     Family History: The patient's family history includes Diabetes in her father; Heart Problems in her mother; Heart disease in her brother; Hypertension in her brother, daughter, daughter, daughter, and daughter; Kidney disease in her brother and sister.  ROS:   Please see the history of present illness.    All other systems reviewed and are negative.  EKGs/Labs/Other Studies Reviewed:    The following studies were reviewed today: .SABRAEKG Interpretation Date/Time:  Tuesday June 07 2024 09:18:46 EDT Ventricular Rate:  72 PR Interval:  164 QRS Duration:  80 QT Interval:  406 QTC Calculation: 444 R Axis:   -5  Text Interpretation: Normal sinus rhythm Anterior infarct , age undetermined When compared with ECG of 12-Dec-2022 19:31, PREVIOUS ECG IS PRESENT Confirmed by Edwyna Backers [phone]) on 06/07/2024 9:39:19 AM     Recent Labs: 03/29/2024: ALT 14 04/01/2024: BUN 40; Creat 1.24; Potassium 4.8; Sodium 140  Recent Lipid Panel    Component Value Date/Time   CHOL 122 03/29/2024 0841   CHOL 115 04/11/2020 0853   TRIG 121.0 03/29/2024 0841   HDL 61.40 03/29/2024 0841   HDL 57 04/11/2020 0853   CHOLHDL 2 03/29/2024 0841   VLDL 24.2 03/29/2024 0841   LDLCALC 36 03/29/2024 0841   LDLCALC 37 04/11/2020 0853   LDLDIRECT 30 09/08/2019 1512    Physical Exam:    VS:  BP 110/68 (BP Location:  Right Arm, Patient Position: Sitting, Cuff Size: Normal)   Pulse 72   Ht 5' 2 (1.575 m)   Wt 132 lb 1.3 oz (59.9 kg)   SpO2 93%   BMI 24.16 kg/m     Wt Readings from Last 3 Encounters:  06/07/24 132 lb 1.3 oz (59.9 kg)  03/29/24 132 lb 9.6 oz (60.1 kg)  01/05/24 135 lb (61.2 kg)     GEN: Patient is in no acute distress HEENT: Normal NECK: No JVD; No carotid bruits LYMPHATICS: No lymphadenopathy CARDIAC: Hear sounds regular, 2/6 systolic murmur at the apex. RESPIRATORY:  Clear to auscultation without rales, wheezing or rhonchi  ABDOMEN: Soft, non-tender, non-distended MUSCULOSKELETAL:  No edema; No deformity  SKIN: Warm and dry NEUROLOGIC:  Alert and oriented x 3 PSYCHIATRIC:  Normal affect   Signed, Backers JONELLE Edwyna, MD  06/07/2024 9:44 AM    [facility]

## 2024-06-07 NOTE — Patient Instructions (Addendum)
 Medication Instructions:  Your physician recommends that you continue on your current medications as directed. Please refer to the Current Medication list given to you today.  *If you need a refill on your cardiac medications before your next appointment, please call your pharmacy*   Follow-Up: At [facility], you and your health needs are our priority.  As part of our continuing mission to provide you with exceptional heart care, our providers are all part of one team.  This team includes your primary Cardiologist (physician) and Advanced Practice Providers or APPs (Physician Assistants and Nurse Practitioners) who all work together to provide you with the care you need, when you need it.  Your next appointment:   9 month(s)  Provider:   Nelia Balzarine, MD

## 2024-06-16 ENCOUNTER — Telehealth: Payer: Self-pay | Admitting: Family Medicine

## 2024-06-16 NOTE — Telephone Encounter (Signed)
 This pt is scheduled for 8/12 I just need an order if needed

## 2024-06-17 ENCOUNTER — Other Ambulatory Visit: Payer: Self-pay

## 2024-06-17 DIAGNOSIS — E875 Hyperkalemia: Secondary | ICD-10-CM

## 2024-06-17 NOTE — Telephone Encounter (Signed)
 Orders placed.

## 2024-06-28 ENCOUNTER — Other Ambulatory Visit (INDEPENDENT_AMBULATORY_CARE_PROVIDER_SITE_OTHER)

## 2024-06-28 DIAGNOSIS — E875 Hyperkalemia: Secondary | ICD-10-CM

## 2024-06-28 NOTE — Addendum Note (Signed)
 Addended by: TRECIA ALMARIE GRADE on: 06/28/2024 05:27 PM   Modules accepted: Orders

## 2024-06-30 ENCOUNTER — Ambulatory Visit: Payer: Self-pay | Admitting: Family Medicine

## 2024-06-30 ENCOUNTER — Other Ambulatory Visit

## 2024-06-30 LAB — BASIC METABOLIC PANEL WITH GFR
BUN: 42 mg/dL — ABNORMAL HIGH (ref 6–23)
CO2: 26 meq/L (ref 19–32)
Calcium: 8.8 mg/dL (ref 8.4–10.5)
Chloride: 104 meq/L (ref 96–112)
Creatinine, Ser: 1.27 mg/dL — ABNORMAL HIGH (ref 0.40–1.20)
GFR: 39.07 mL/min — ABNORMAL LOW (ref >60.00)
Glucose, Bld: 131 mg/dL — ABNORMAL HIGH (ref 70–99)
Potassium: 5.1 meq/L (ref 3.5–5.1)
Sodium: 139 meq/L (ref 135–145)

## 2024-06-30 NOTE — Addendum Note (Signed)
 Addended by: TRUDY CURVIN RAMAN on: 06/30/2024 08:46 AM   Modules accepted: Orders

## 2024-06-30 NOTE — Addendum Note (Signed)
 Addended by: TRECIA ALMARIE GRADE on: 06/30/2024 08:23 AM   Modules accepted: Orders

## 2024-06-30 NOTE — Addendum Note (Signed)
 Addended by: TRUDY CURVIN RAMAN on: 06/30/2024 08:47 AM   Modules accepted: Orders

## 2024-07-06 ENCOUNTER — Other Ambulatory Visit: Payer: Self-pay | Admitting: Family Medicine

## 2024-07-06 DIAGNOSIS — K219 Gastro-esophageal reflux disease without esophagitis: Secondary | ICD-10-CM

## 2024-07-06 MED ORDER — PANTOPRAZOLE SODIUM 20 MG PO TBEC: 20.0000 mg | DELAYED_RELEASE_TABLET | Freq: Every day | ORAL | 1 refills | Status: DC

## 2024-07-06 NOTE — Telephone Encounter (Signed)
 Copied from CRM [id number]. Topic: Clinical - Medication Refill >> Jul 06, 2024 11:30 AM Suzen RAMAN wrote: Medication: pantoprazole  (PROTONIX ) 20 MG tablet  Has the patient contacted their pharmacy? Yes  This is the patient's preferred pharmacy:  [id number] - [place], [place] - [address] [facility] [address] [place] [address] Phone: [phone] Fax: [phone]  Is this the correct pharmacy for this prescription? Yes If no, delete pharmacy and type the correct one.   Has the prescription been filled recently? No  Is the patient out of the medication? Yes  Has the patient been seen for an appointment in the last year OR does the patient have an upcoming appointment? Yes  Can we respond through MyChart? Yes  Agent: Please be advised that Rx refills may take up to 3 business days. We ask that you follow-up with your pharmacy.

## 2024-07-12 ENCOUNTER — Ambulatory Visit (INDEPENDENT_AMBULATORY_CARE_PROVIDER_SITE_OTHER)

## 2024-07-12 VITALS — Ht 62.0 in | Wt 132.0 lb

## 2024-07-12 DIAGNOSIS — Z0000 Encounter for general adult medical examination without abnormal findings: Secondary | ICD-10-CM

## 2024-07-12 NOTE — Progress Notes (Signed)
 Subjective:   Margaret Ward is a 83 y.o. who presents for a Medicare Wellness preventive visit.  As a reminder, Annual Wellness Visits don't include a physical exam, and some assessments may be limited, especially if this visit is performed virtually. We may recommend an in-person follow-up visit with your provider if needed.  Visit Complete: Virtual I connected with  Margaret Ward on 07/12/24 by a audio enabled telemedicine application and verified that I am speaking with the correct person using two identifiers.  Patient Location: Home  Provider Location: Home Office  I discussed the limitations of evaluation and management by telemedicine. The patient expressed understanding and agreed to proceed.  Vital Signs: Because this visit was a virtual/telehealth visit, some criteria may be missing or patient reported. Any vitals not documented were not able to be obtained and vitals that have been documented are patient reported.    Persons Participating in Visit: Patient.  AWV Questionnaire: No: Patient Medicare AWV questionnaire was not completed prior to this visit.  Cardiac Risk Factors include: advanced age (>5men, >51 women);hypertension     Objective:    Today's Vitals   07/12/24 1135  Weight: 132 lb (59.9 kg)  Height: 5' 2 (1.575 m)   Body mass index is 24.14 kg/m.     07/12/2024   11:41 AM 07/09/2023    9:41 AM 07/07/2022    9:53 AM 06/13/2020    9:25 AM 05/30/2019    7:02 PM 05/21/2018    6:00 PM 03/06/2018   10:34 AM  Advanced Directives  Does Patient Have a Medical Advance Directive? Yes Yes Yes Yes Yes Yes  No   Type of Estate agent of [place];Living will Healthcare Power of [place];Living will Healthcare Power of [place];Living will Healthcare Power of [place];Living will Healthcare Power of [place];Living will Healthcare Power of [place];Living will   Does patient want to make changes to medical advance directive? No -  Patient declined No - Patient declined    No - Patient declined    Copy of Healthcare Power of Attorney in Chart? Yes - validated most recent copy scanned in chart (See row information) Yes - validated most recent copy scanned in chart (See row information) Yes - validated most recent copy scanned in chart (See row information)   No - copy requested    Would patient like information on creating a medical advance directive?       No - Patient declined      Data saved with a previous flowsheet row definition    Current Medications (verified) Outpatient Encounter Medications as of 07/12/2024  Medication Sig   acetaminophen  (TYLENOL ) 500 MG tablet Take 1,000 mg by mouth every 6 (six) hours as needed for headache.    allopurinol  (ZYLOPRIM ) 100 MG tablet TAKE 1 TABLET(100 MG) BY MOUTH DAILY   atorvastatin  (LIPITOR) 10 MG tablet Take 1 tablet (10 mg total) by mouth daily.   clopidogrel  (PLAVIX ) 75 MG tablet TAKE 1 TABLET(75 MG) BY MOUTH DAILY   empagliflozin  (JARDIANCE ) 25 MG TABS tablet Take 1 tablet (25 mg total) by mouth daily.   furosemide  (LASIX ) 20 MG tablet TAKE 1 TABLET BY MOUTH DAILY. TAKE ADDITIONAL TABLET ON MONDAY, WEDNESDAY AND FRIDAY   metFORMIN  (GLUCOPHAGE -XR) 500 MG 24 hr tablet Take 2 tablets (1,000 mg total) by mouth daily with breakfast.   Multiple Vitamin (MULTIVITAMIN WITH MINERALS) TABS tablet Take 1 tablet by mouth daily.   nitroGLYCERIN  (NITROSTAT ) 0.4 MG SL tablet Place 1 tablet (  0.4 mg total) under the tongue every 5 (five) minutes x 3 doses as needed for chest pain.   pantoprazole  (PROTONIX ) 20 MG tablet Take 1 tablet (20 mg total) by mouth daily.   Pseudoeph-Doxylamine-DM-APAP (NYQUIL PO) Take 1 Dose by mouth at bedtime as needed (congestion).    sacubitril -valsartan  (ENTRESTO ) 49-51 MG TAKE 1 TABLET BY MOUTH TWICE DAILY   spironolactone  (ALDACTONE ) 25 MG tablet TAKE 1/2 TABLET(12.5 MG) BY MOUTH DAILY   venlafaxine  XR (EFFEXOR -XR) 75 MG 24 hr capsule Take 1 capsule (75 mg  total) by mouth daily with breakfast.   vitamin C  (ASCORBIC ACID ) 500 MG tablet Take 500 mg by mouth 2 (two) times daily.   No facility-administered encounter medications on file as of 07/12/2024.    Allergies (verified) Methylprednisolone, Keflex [cephalexin], Ketorolac, Morphine  and codeine , Novocain [procaine], Ramipril, and Septra [sulfamethoxazole-trimethoprim]   History: Past Medical History:  Diagnosis Date   Anemia    Anemia due to chronic blood loss 02/05/2021   Arthritis    hands, arms, all over the place (03/02/2018)   Bilateral carpal tunnel syndrome 10/11/2019   Bronchospasm 03/28/2018   CAD (coronary artery disease) 02/08/2018   Left heart cath 01/13/18: 40% distal LMCAS 90% proximal LAD  LCF with 90% proximal ramus stenosis, diffuse RCA, 60% distal and 90% proximal PDA stenosis   CAD in native artery    a. s/p CABG in 01/2018. b. Recurrent CP 02/2018 -> cath showing early graft closure SVG-diagonal, received DES to D1.   CAD/s/p CABG Mar 2019 w/ DES to vein graft Apr 2019 05/21/2018   Cameron ulcer, acute 05/27/2018   Carpal tunnel syndrome, bilateral 12/28/2018   Chest pain 02/05/2018   Chronic combined systolic and diastolic CHF (congestive heart failure) (HCC)    a. LVEF 40-45% by echo 02/2018.   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 s/p CABG in 01/2018. b. Recurrent CP 02/2018 -> cath showing early graft closure SVG-diagonal, received DES to D1.   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   Essential hypertension 05/21/2018   Falls frequently 09/04/2021   Gastric ulcer, unspecified as acute or chronic, without hemorrhage or  perforation 05/27/2018   Gastroesophageal reflux disease 05/21/2018   Glass eye    right eye   Gout    on daily RX (03/02/2018)   Heme positive stool 05/21/2018   History of anemia 02/05/2021   History of cardiomyopathy 10/08/2022   History of cholecystectomy 02/05/2021   History of kidney stones    HLD (hyperlipidemia) 05/21/2018   Hyperlipidemia    Hypertension    Hypertensive heart disease    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   Kidney cysts    Kidney disease    Left ventricular aneurysm    Migraine    used to get them twice/week; haven't had them in a long time (03/02/2018)   Mixed dyslipidemia 10/08/2022   NSTEMI (non-ST elevated myocardial infarction) (HCC) 03/01/2018   Obesity (BMI 30-39.9) 07/25/2020   On home oxygen therapy    3L at night and prn during the day (03/02/2018)   Physical deconditioning 10/24/2020   Pneumonia  used to get it twice/year; stopped after I had hysterectomy (03/02/2018)   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   Senile purpura (HCC) 09/04/2021   Sinus tachycardia 03/09/2018   Sleep apnea    SOB (shortness of breath) 03/09/2018   Symptomatic anemia 05/21/2018   Type II diabetes mellitus (HCC)    Past Surgical History:  Procedure Laterality Date   BACK SURGERY     BIOPSY  05/22/2018   Procedure: BIOPSY;  Surgeon: Elicia Claw, MD;  Location: [facility];  Service: Gastroenterology;;   CARDIAC CATHETERIZATION  12/2017   before [facility]   CHOLECYSTECTOMY OPEN  1982   CORONARY ARTERY BYPASS GRAFT  01/19/2018   CABG X3; in [place]    CORONARY STENT INTERVENTION N/A 03/02/2018   Procedure: CORONARY STENT INTERVENTION;  Surgeon: Verlin Lonni BIRCH, MD;  Location: [facility];  Service: Cardiovascular;  Laterality: N/A;   DILATION AND CURETTAGE OF UTERUS     ESOPHAGOGASTRODUODENOSCOPY (EGD) WITH PROPOFOL  N/A 05/22/2018   Procedure: ESOPHAGOGASTRODUODENOSCOPY (EGD) WITH PROPOFOL ;  Surgeon: Elicia Claw,  MD;  Location: [facility];  Service: Gastroenterology;  Laterality: N/A;   ESOPHAGOGASTRODUODENOSCOPY (EGD) WITH PROPOFOL  N/A 05/24/2018   Procedure: ESOPHAGOGASTRODUODENOSCOPY (EGD) WITH PROPOFOL ;  Surgeon: Elicia Claw, MD;  Location: [facility];  Service: Gastroenterology;  Laterality: N/A;   EYE SURGERY Right    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   KNEE ARTHROSCOPY Right X 4   LEFT HEART CATH AND CORS/GRAFTS ANGIOGRAPHY N/A 03/02/2018   Procedure: LEFT HEART CATH AND CORS/GRAFTS ANGIOGRAPHY;  Surgeon: Verlin Lonni BIRCH, MD;  [facility];  Service: Cardiovascular;  Laterality: N/A;   LEFT HEART CATH AND CORS/GRAFTS ANGIOGRAPHY N/A 06/13/2020   Procedure: LEFT HEART CATH AND CORS/GRAFTS ANGIOGRAPHY;  Surgeon: Verlin Lonni BIRCH, MD;  [facility];  Service: Cardiovascular;  Laterality: N/A;   LUMBAR DISC SURGERY     REDUCTION MAMMAPLASTY Bilateral X 2   SCHLEROTHERAPY  05/24/2018   Procedure: SCHLEROTHERAPY;  Surgeon: Elicia Claw, MD;  Location: [facility];  Service: Gastroenterology;;   TONSILLECTOMY  1953   VAGINAL HYSTERECTOMY  1970   Family History  Problem Relation Age of Onset   Heart Problems Mother    Diabetes Father    Kidney disease Sister    Heart disease Brother    Hypertension Brother    Kidney disease Brother    Hypertension Daughter    Hypertension Daughter    Hypertension Daughter    Hypertension Daughter    Social History   Socioeconomic History   Marital status: Married    Spouse name: Not on file   Number of children: Not on file   Years of education: Not on file   Highest education level: Associate degree: academic program  Occupational History   Not on file  Tobacco Use   Smoking status: Former    Current packs/day: 0.00    Average packs/day: 0.1 packs/day for 3.0 years (0.4 ttl pk-yrs)    Types: Cigarettes    Start date: 30    Quit date: 1963    Years since quitting: 62.6   Smokeless tobacco: Never   Vaping Use   Vaping status: Never Used  Substance and Sexual Activity   Alcohol use: Not Currently   Drug use: Never   Sexual activity: Not on file  Other Topics Concern   Not on file  Social History Narrative   Not on file   Social Drivers of Health   Financial Resource Strain: Low Risk  (  07/12/2024)   Overall Financial Resource Strain (CARDIA)    Difficulty of Paying Living Expenses: Not hard at all  Food Insecurity: No Food Insecurity (07/12/2024)   Hunger Vital Sign    Worried About Running Out of Food in the Last Year: Never true    Ran Out of Food in the Last Year: Never true  Transportation Needs: No Transportation Needs (07/12/2024)   PRAPARE - Administrator, Civil Service (Medical): No    Lack of Transportation (Non-Medical): No  Physical Activity: Insufficiently Active (07/12/2024)   Exercise Vital Sign    Days of Exercise per Week: 7 days    Minutes of Exercise per Session: 20 min  Stress: No Stress Concern Present (07/12/2024)   [employer] of Occupational Health - Occupational Stress Questionnaire    Feeling of Stress: Not at all  Social Connections: Moderately Isolated (07/12/2024)   Social Connection and Isolation Panel    Frequency of Communication with Friends and Family: More than three times a week    Frequency of Social Gatherings with Friends and Family: More than three times a week    Attends Religious Services: Never    Database administrator or Organizations: No    Attends Engineer, structural: Never    Marital Status: Married    Tobacco Counseling Counseling given: Not Answered    Clinical Intake:  Pre-visit preparation completed: Yes  Pain : No/denies pain     BMI - recorded: 24.14 Nutritional Status: BMI of 19-24  Normal Nutritional Risks: None Diabetes: Yes CBG done?: No Did pt. bring in CBG monitor from home?: No  Lab Results  Component Value Date   HGBA1C 7.0 (H) 03/29/2024   HGBA1C 7.0 (H) 09/29/2023    HGBA1C 7.1 (H) 03/25/2023     How often do you need to have someone help you when you read instructions, pamphlets, or other written materials from your doctor or pharmacy?: 1 - Never  Interpreter Needed?: No  Information entered by :: Rojelio Blush LPN   Activities of Daily Living     07/12/2024   11:40 AM  In your present state of health, do you have any difficulty performing the following activities:  Hearing? 0  Vision? 0  Difficulty concentrating or making decisions? 0  Walking or climbing stairs? 1  Comment Uses a Walker  Dressing or bathing? 0  Doing errands, shopping? 0  Preparing Food and eating ? N  Using the Toilet? N  In the past six months, have you accidently leaked urine? N  Do you have problems with loss of bowel control? N  Managing your Medications? N  Managing your Finances? N  Housekeeping or managing your Housekeeping? N    Patient Care Team: Frann Mabel Mt, DO as PCP - General (Family Medicine) Revankar, Jennifer SAUNDERS, MD as PCP - Cardiology (Cardiology)  I have updated your Care Teams any recent Medical Services you may have received from other providers in the past year.     Assessment:   This is a routine wellness examination for Margaret Ward.  Hearing/Vision screen Hearing Screening - Comments:: Denies hearing difficulties   Vision Screening - Comments:: Wears rx glasses - up to date with [facility] exams with  [facility]   Goals Addressed               This Visit's Progress     Increase physical activity (pt-stated)        Remain active  Depression Screen     07/12/2024   11:39 AM 07/09/2023    9:50 AM 09/16/2022    3:50 PM 07/07/2022    9:56 AM 08/02/2021    8:38 AM 05/22/2021    8:27 AM 10/24/2020    8:13 AM  PHQ 2/9 Scores  PHQ - 2 Score 0 0 0 0 0 0 0    Fall Risk     07/12/2024   11:40 AM 07/09/2023    9:45 AM 09/24/2022    8:25 AM 07/07/2022    9:54 AM 08/02/2021    8:38 AM  Fall Risk   Falls in the past  year? 1 1 1 1  0  Number falls in past yr: 0 0 0 1 0  Injury with Fall? 0 0 1 1 0  Risk for fall due to : No Fall Risks History of fall(s) No Fall Risks History of fall(s) No Fall Risks  Follow up Falls evaluation completed Falls evaluation completed Falls evaluation completed  Falls prevention discussed  Falls evaluation completed      Data saved with a previous flowsheet row definition    MEDICARE RISK AT HOME:  Medicare Risk at Home Any stairs in or around the home?: No If so, are there any without handrails?: No Home free of loose throw rugs in walkways, pet beds, electrical cords, etc?: Yes Adequate lighting in your home to reduce risk of falls?: Yes Life alert?: No Use of a cane, walker or w/c?: Yes Grab bars in the bathroom?: Yes Shower chair or bench in shower?: No Elevated toilet seat or a handicapped toilet?: No  TIMED UP AND GO:  Was the test performed?  No  Cognitive Function: 6CIT completed        07/12/2024   11:41 AM 07/09/2023    9:52 AM 07/07/2022   10:02 AM  6CIT Screen  What Year? 0 points 0 points 0 points  What month? 0 points 0 points 0 points  What time? 0 points 0 points 0 points  Count back from 20 0 points 0 points 0 points  Months in reverse 0 points 0 points 0 points  Repeat phrase 0 points 0 points 2 points  Total Score 0 points 0 points 2 points    Immunizations Immunization History  Administered Date(s) Administered   Fluad Quad(high Dose 65+) 09/04/2021, 09/24/2022   INFLUENZA, HIGH DOSE SEASONAL PF 08/26/2018, 10/03/2020   Influenza-Unspecified 08/17/2017, 09/16/2019, 08/25/2023   Moderna Sars-Covid-2 Vaccination 01/27/2020, 02/24/2020   PNEUMOCOCCAL CONJUGATE-20 09/04/2021   Pneumococcal Polysaccharide-23 08/26/2018   Td 01/15/2013   Tdap 02/06/2022    Screening Tests Health Maintenance  Topic Date Due   INFLUENZA VACCINE  06/17/2024   COVID-19 Vaccine (3 - Moderna risk series) 09/29/2024 (Originally 03/23/2020)   HEMOGLOBIN A1C   09/29/2024   FOOT EXAM  03/29/2025   Diabetic kidney evaluation - Urine ACR  04/01/2025   OPHTHALMOLOGY EXAM  04/28/2025   Diabetic kidney evaluation - eGFR measurement  06/30/2025   Medicare Annual Wellness (AWV)  07/12/2025   DTaP/Tdap/Td (3 - Td or Tdap) 02/07/2032   Pneumococcal Vaccine: 50+ Years  Completed   DEXA SCAN  Completed   HPV VACCINES  Aged Out   Meningococcal B Vaccine  Aged Out   Zoster Vaccines- Shingrix  Discontinued    Health Maintenance  Health Maintenance Due  Topic Date Due   INFLUENZA VACCINE  06/17/2024   Health Maintenance Items Addressed:   Additional Screening:  Vision Screening: Recommended annual ophthalmology  exams for early detection of glaucoma and other disorders of the eye. Would you like a referral to an eye doctor? No    Dental Screening: Recommended annual dental exams for proper oral hygiene  Community Resource Referral / Chronic Care Management: CRR required this visit?  No   CCM required this visit?  No   Plan:    I have personally reviewed and noted the following in the patient's chart:   Medical and social history Use of alcohol, tobacco or illicit drugs  Current medications and supplements including opioid prescriptions. Patient is not currently taking opioid prescriptions. Functional ability and status Nutritional status Physical activity Advanced directives List of other physicians Hospitalizations, surgeries, and ER visits in previous 12 months Vitals Screenings to include cognitive, depression, and falls Referrals and appointments  In addition, I have reviewed and discussed with patient certain preventive protocols, quality metrics, and best practice recommendations. A written personalized care plan for preventive services as well as general preventive health recommendations were provided to patient.   Rojelio LELON Blush, LPN   [id number]   After Visit Summary: (MyChart) Due to this being a telephonic visit, the  after visit summary with patients personalized plan was offered to patient via MyChart   Notes: Nothing significant to report at this time.

## 2024-07-12 NOTE — Patient Instructions (Signed)
 Margaret Ward , Thank you for taking time out of your busy schedule to complete your Annual Wellness Visit with me. I enjoyed our conversation and look forward to speaking with you again next year. I, as well as your care team,  appreciate your ongoing commitment to your health goals. Please review the following plan we discussed and let me know if I can assist you in the future. Your Game plan/ To Do List    Referrals: If you haven't heard from the office you've been referred to, please reach out to them at the phone provided.   Follow up Visits: We will see or speak with you next year for your Next Medicare AWV with our clinical staff 07/18/25 @ 11:30a  Have you seen your provider in the last 6 months (3 months if uncontrolled diabetes)? Next appointment with provider 09/30/24 @ 8:30a  Clinician Recommendations:  Aim for 30 minutes of exercise or brisk walking, 6-8 glasses of water, and 5 servings of fruits and vegetables each day.       This is a list of the screenings recommended for you:  Health Maintenance  Topic Date Due   Flu Shot  06/17/2024   COVID-19 Vaccine (3 - Moderna risk series) 09/29/2024*   Hemoglobin A1C  09/29/2024   Complete foot exam   03/29/2025   Yearly kidney health urinalysis for diabetes  04/01/2025   Eye exam for diabetics  04/28/2025   Yearly kidney function blood test for diabetes  06/30/2025   Medicare Annual Wellness Visit  07/12/2025   DTaP/Tdap/Td vaccine (3 - Td or Tdap) 02/07/2032   Pneumococcal Vaccine for age over 25  Completed   DEXA scan (bone density measurement)  Completed   HPV Vaccine  Aged Out   Meningitis B Vaccine  Aged Out   Zoster (Shingles) Vaccine  Discontinued  *Topic was postponed. The date shown is not the original due date.    Advanced directives: (In Chart) A copy of your advanced directives are scanned into your chart should your provider ever need it. Advance Care Planning is important because it:  [x]  Makes sure you receive  the medical care that is consistent with your values, goals, and preferences  [x]  It provides guidance to your family and loved ones and reduces their decisional burden about whether or not they are making the right decisions based on your wishes.  Follow the link provided in your after visit summary or read over the paperwork we have mailed to you to help you started getting your Advance Directives in place. If you need assistance in completing these, please reach out to us  so that we can help you!  See attachments for Preventive Care and Fall Prevention Tips.

## 2024-08-17 ENCOUNTER — Other Ambulatory Visit: Payer: Self-pay | Admitting: Family Medicine

## 2024-09-14 ENCOUNTER — Other Ambulatory Visit: Payer: Self-pay | Admitting: Cardiology

## 2024-09-14 ENCOUNTER — Other Ambulatory Visit: Payer: Self-pay | Admitting: Family Medicine

## 2024-09-28 ENCOUNTER — Other Ambulatory Visit: Payer: Self-pay | Admitting: Family Medicine

## 2024-09-28 DIAGNOSIS — G629 Polyneuropathy, unspecified: Secondary | ICD-10-CM

## 2024-09-30 ENCOUNTER — Encounter: Payer: Self-pay | Admitting: Family Medicine

## 2024-09-30 ENCOUNTER — Ambulatory Visit: Admitting: Family Medicine

## 2024-09-30 VITALS — BP 118/68 | HR 80 | Temp 97.9°F | Resp 16 | Ht 62.0 in | Wt 134.0 lb

## 2024-09-30 DIAGNOSIS — Z7984 Long term (current) use of oral hypoglycemic drugs: Secondary | ICD-10-CM | POA: Diagnosis not present

## 2024-09-30 DIAGNOSIS — M1A9XX0 Chronic gout, unspecified, without tophus (tophi): Secondary | ICD-10-CM

## 2024-09-30 DIAGNOSIS — Z23 Encounter for immunization: Secondary | ICD-10-CM | POA: Diagnosis not present

## 2024-09-30 DIAGNOSIS — E1165 Type 2 diabetes mellitus with hyperglycemia: Secondary | ICD-10-CM

## 2024-09-30 LAB — COMPREHENSIVE METABOLIC PANEL WITH GFR
ALT: 12 U/L (ref 0–35)
AST: 15 U/L (ref 0–37)
Albumin: 4 g/dL (ref 3.5–5.2)
Alkaline Phosphatase: 78 U/L (ref 39–117)
BUN: 56 mg/dL — ABNORMAL HIGH (ref 6–23)
CO2: 26 meq/L (ref 19–32)
Calcium: 8.9 mg/dL (ref 8.4–10.5)
Chloride: 102 meq/L (ref 96–112)
Creatinine, Ser: 1.37 mg/dL — ABNORMAL HIGH (ref 0.40–1.20)
GFR: 35.61 mL/min — ABNORMAL LOW (ref >60.00)
Glucose, Bld: 117 mg/dL — ABNORMAL HIGH (ref 70–99)
Potassium: 4.7 meq/L (ref 3.5–5.1)
Sodium: 138 meq/L (ref 135–145)
Total Bilirubin: 0.7 mg/dL (ref 0.2–1.2)
Total Protein: 6.6 g/dL (ref 6.0–8.3)

## 2024-09-30 LAB — URIC ACID: Uric Acid, Serum: 4.2 mg/dL (ref 2.4–7.0)

## 2024-09-30 LAB — LIPID PANEL
Cholesterol: 110 mg/dL (ref 0–200)
HDL: 55.5 mg/dL (ref >39.00)
LDL Cholesterol: 34 mg/dL (ref 0–99)
NonHDL: 54.91
Total CHOL/HDL Ratio: 2
Triglycerides: 104 mg/dL (ref 0.0–149.0)
VLDL: 20.8 mg/dL (ref 0.0–40.0)

## 2024-09-30 NOTE — Addendum Note (Signed)
 Addended by: Jannett Schmall M on: 09/30/2024 09:27 AM   Modules accepted: Orders

## 2024-09-30 NOTE — Progress Notes (Signed)
 Subjective:   Chief Complaint  Patient presents with   Medication Refill    Medication Refill    Margaret Ward is a 83 y.o. female here for follow-up of diabetes.  Here with her daughter who helps with the history. [name]'s self monitored glucose range is low-mid 100's.  Patient denies hypoglycemic reactions. She checks her glucose levels several times per week.  Patient does not require insulin .   Medications include: Jardiance  25 mg daily, metformin  1000 mg twice daily Diet is healthy.  Exercise: Limited No new chest pain or shortness of breath  Gout Patient has a history of gout.  She takes allopurinol  100 mg daily.  Compliant, no defects.  No recent flares.  She does adhere to a healthy diet overall which happens to be low in purines as well.  Past Medical History:  Diagnosis Date   Anemia    Anemia due to chronic blood loss 02/05/2021   Arthritis    hands, arms, all over the place (03/02/2018)   Bilateral carpal tunnel syndrome 10/11/2019   Bronchospasm 03/28/2018   CAD (coronary artery disease) 02/08/2018   Left heart cath 01/13/18: 40% distal LMCAS 90% proximal LAD  LCF with 90% proximal ramus stenosis, diffuse RCA, 60% distal and 90% proximal PDA stenosis   CAD in native artery    a. s/p CABG in 01/2018. b. Recurrent CP 02/2018 -> cath showing early graft closure SVG-diagonal, received DES to D1.   CAD/s/p CABG Mar 2019 w/ DES to vein graft Apr 2019 05/21/2018   Cameron ulcer, acute 05/27/2018   Carpal tunnel syndrome, bilateral 12/28/2018   Chest pain 02/05/2018   Chronic combined systolic and diastolic CHF (congestive heart failure) (HCC)    a. LVEF 40-45% by echo 02/2018.   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 s/p CABG in 01/2018. b. Recurrent CP 02/2018 -> cath showing early graft closure SVG-diagonal, received DES to D1.   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   Essential hypertension 05/21/2018   Falls frequently 09/04/2021   Gastric ulcer, unspecified as acute or chronic, without hemorrhage or perforation 05/27/2018   Gastroesophageal reflux disease 05/21/2018   Glass eye    right eye   Gout    on daily RX (03/02/2018)   Heme positive stool 05/21/2018   History of anemia 02/05/2021   History of cardiomyopathy 10/08/2022   History of cholecystectomy 02/05/2021   History of kidney stones    HLD (hyperlipidemia) 05/21/2018   Hyperlipidemia    Hypertension    Hypertensive heart disease    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   Kidney cysts    Kidney disease    Left ventricular aneurysm    Migraine    used to get them twice/week; haven't had them in a long time (03/02/2018)   Mixed dyslipidemia 10/08/2022   NSTEMI (non-ST elevated myocardial infarction) (HCC) 03/01/2018   Obesity (BMI 30-39.9) 07/25/2020   On home oxygen therapy    3L at night and prn during the day (03/02/2018)   Physical deconditioning 10/24/2020   Pneumonia    used to get it twice/year; stopped after I had  hysterectomy (03/02/2018)   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   Senile purpura 09/04/2021   Sinus tachycardia 03/09/2018   Sleep apnea    SOB (shortness of breath) 03/09/2018   Symptomatic anemia 05/21/2018   Type II diabetes mellitus (HCC)      Related testing: Retinal exam: Done Pneumovax: done  Objective:  BP 118/68 (BP Location: Left Arm, Patient Position: Sitting)   Pulse 80   Temp 97.9 F (36.6 C) (Oral)   Resp 16   Ht 5' 2 (1.575 m)   Wt 134 lb (60.8 kg)   SpO2 99%   BMI 24.51 kg/m  General:  Well developed, well nourished, in no apparent distress Lungs:  CTAB, no access msc  use Cardio:  RRR, no bruits, no LE edema Psych: Age appropriate judgment and insight  Assessment:   Type 2 diabetes mellitus with hyperglycemia, without long-term current use of insulin  (HCC) - Plan: Comprehensive metabolic panel with GFR, Lipid panel, Hemoglobin A1c  Chronic gout without tophus, unspecified cause, unspecified site - Plan: Uric acid   Plan:   Chronic. Stable. Cont metformin  10000 mg bid, Jardiance  25 mg/d. Counseled on diet and exercise. Chronic, stable. Cont allopurinol  100 mg/d.  F/u in 6 mo. The patient voiced understanding and agreement to the plan.  Mabel Mt [place], DO 09/30/24 8:41 AM

## 2024-09-30 NOTE — Patient Instructions (Signed)
 Give us  2-3 business days to get the results of your labs back.   Keep the diet clean and stay active.  Let us  know if you need anything.

## 2024-10-03 ENCOUNTER — Ambulatory Visit: Payer: Self-pay | Admitting: Family Medicine

## 2024-10-03 LAB — HEMOGLOBIN A1C: Hgb A1c MFr Bld: 7 % — ABNORMAL HIGH (ref 4.6–6.5)

## 2024-10-03 MED ORDER — METFORMIN HCL ER 500 MG PO TB24
500.0000 mg | ORAL_TABLET | Freq: Every day | ORAL | Status: AC
Start: 2024-10-03 — End: ?

## 2024-10-12 ENCOUNTER — Other Ambulatory Visit: Payer: Self-pay | Admitting: Family Medicine

## 2024-10-16 ENCOUNTER — Other Ambulatory Visit: Payer: Self-pay | Admitting: Family Medicine

## 2024-10-16 DIAGNOSIS — K219 Gastro-esophageal reflux disease without esophagitis: Secondary | ICD-10-CM

## 2024-12-11 ENCOUNTER — Emergency Department (HOSPITAL_COMMUNITY)

## 2024-12-11 ENCOUNTER — Inpatient Hospital Stay (HOSPITAL_COMMUNITY)
Admission: EM | Admit: 2024-12-11 | Discharge: 2024-12-14 | DRG: 522 | Disposition: A | Discharge status: Skilled Nursing Facility | Attending: Internal Medicine | Admitting: Internal Medicine

## 2024-12-11 ENCOUNTER — Other Ambulatory Visit: Payer: Self-pay

## 2024-12-11 DIAGNOSIS — N1832 Chronic kidney disease, stage 3b: Secondary | ICD-10-CM | POA: Diagnosis present

## 2024-12-11 DIAGNOSIS — Z888 Allergy status to other drugs, medicaments and biological substances status: Secondary | ICD-10-CM

## 2024-12-11 DIAGNOSIS — S72012A Unspecified intracapsular fracture of left femur, initial encounter for closed fracture: Principal | ICD-10-CM | POA: Diagnosis present

## 2024-12-11 DIAGNOSIS — I2510 Atherosclerotic heart disease of native coronary artery without angina pectoris: Secondary | ICD-10-CM | POA: Diagnosis present

## 2024-12-11 DIAGNOSIS — E119 Type 2 diabetes mellitus without complications: Secondary | ICD-10-CM

## 2024-12-11 DIAGNOSIS — Z7984 Long term (current) use of oral hypoglycemic drugs: Secondary | ICD-10-CM | POA: Diagnosis not present

## 2024-12-11 DIAGNOSIS — S72002A Fracture of unspecified part of neck of left femur, initial encounter for closed fracture: Secondary | ICD-10-CM | POA: Diagnosis present

## 2024-12-11 DIAGNOSIS — I5032 Chronic diastolic (congestive) heart failure: Secondary | ICD-10-CM | POA: Diagnosis present

## 2024-12-11 DIAGNOSIS — I110 Hypertensive heart disease with heart failure: Secondary | ICD-10-CM | POA: Diagnosis not present

## 2024-12-11 DIAGNOSIS — W010XXA Fall on same level from slipping, tripping and stumbling without subsequent striking against object, initial encounter: Secondary | ICD-10-CM | POA: Diagnosis present

## 2024-12-11 DIAGNOSIS — M62838 Other muscle spasm: Secondary | ICD-10-CM | POA: Diagnosis present

## 2024-12-11 DIAGNOSIS — Z833 Family history of diabetes mellitus: Secondary | ICD-10-CM | POA: Diagnosis not present

## 2024-12-11 DIAGNOSIS — Y92002 Bathroom of unspecified non-institutional (private) residence single-family (private) house as the place of occurrence of the external cause: Secondary | ICD-10-CM | POA: Diagnosis not present

## 2024-12-11 DIAGNOSIS — I5042 Chronic combined systolic (congestive) and diastolic (congestive) heart failure: Secondary | ICD-10-CM | POA: Diagnosis not present

## 2024-12-11 DIAGNOSIS — I130 Hypertensive heart and chronic kidney disease with heart failure and stage 1 through stage 4 chronic kidney disease, or unspecified chronic kidney disease: Secondary | ICD-10-CM | POA: Diagnosis present

## 2024-12-11 DIAGNOSIS — Z886 Allergy status to analgesic agent status: Secondary | ICD-10-CM | POA: Diagnosis not present

## 2024-12-11 DIAGNOSIS — E782 Mixed hyperlipidemia: Secondary | ICD-10-CM | POA: Diagnosis present

## 2024-12-11 DIAGNOSIS — W19XXXA Unspecified fall, initial encounter: Principal | ICD-10-CM

## 2024-12-11 DIAGNOSIS — Z7902 Long term (current) use of antithrombotics/antiplatelets: Secondary | ICD-10-CM | POA: Diagnosis not present

## 2024-12-11 DIAGNOSIS — H5461 Unqualified visual loss, right eye, normal vision left eye: Secondary | ICD-10-CM | POA: Diagnosis present

## 2024-12-11 DIAGNOSIS — E669 Obesity, unspecified: Secondary | ICD-10-CM

## 2024-12-11 DIAGNOSIS — M1A9XX0 Chronic gout, unspecified, without tophus (tophi): Secondary | ICD-10-CM | POA: Diagnosis present

## 2024-12-11 DIAGNOSIS — Z881 Allergy status to other antibiotic agents status: Secondary | ICD-10-CM

## 2024-12-11 DIAGNOSIS — Z8249 Family history of ischemic heart disease and other diseases of the circulatory system: Secondary | ICD-10-CM | POA: Diagnosis not present

## 2024-12-11 DIAGNOSIS — Z951 Presence of aortocoronary bypass graft: Secondary | ICD-10-CM | POA: Diagnosis not present

## 2024-12-11 DIAGNOSIS — E1122 Type 2 diabetes mellitus with diabetic chronic kidney disease: Secondary | ICD-10-CM | POA: Diagnosis present

## 2024-12-11 DIAGNOSIS — I429 Cardiomyopathy, unspecified: Secondary | ICD-10-CM | POA: Diagnosis present

## 2024-12-11 DIAGNOSIS — Z885 Allergy status to narcotic agent status: Secondary | ICD-10-CM

## 2024-12-11 DIAGNOSIS — E875 Hyperkalemia: Secondary | ICD-10-CM | POA: Diagnosis present

## 2024-12-11 DIAGNOSIS — I10 Essential (primary) hypertension: Secondary | ICD-10-CM | POA: Diagnosis present

## 2024-12-11 DIAGNOSIS — K219 Gastro-esophageal reflux disease without esophagitis: Secondary | ICD-10-CM | POA: Diagnosis present

## 2024-12-11 DIAGNOSIS — I252 Old myocardial infarction: Secondary | ICD-10-CM

## 2024-12-11 DIAGNOSIS — R296 Repeated falls: Secondary | ICD-10-CM | POA: Diagnosis present

## 2024-12-11 DIAGNOSIS — Z955 Presence of coronary angioplasty implant and graft: Secondary | ICD-10-CM | POA: Diagnosis not present

## 2024-12-11 DIAGNOSIS — Z87891 Personal history of nicotine dependence: Secondary | ICD-10-CM

## 2024-12-11 DIAGNOSIS — Z79899 Other long term (current) drug therapy: Secondary | ICD-10-CM

## 2024-12-11 DIAGNOSIS — E785 Hyperlipidemia, unspecified: Secondary | ICD-10-CM | POA: Diagnosis present

## 2024-12-11 DIAGNOSIS — Z7982 Long term (current) use of aspirin: Secondary | ICD-10-CM

## 2024-12-11 DIAGNOSIS — G4730 Sleep apnea, unspecified: Secondary | ICD-10-CM | POA: Diagnosis present

## 2024-12-11 DIAGNOSIS — Z882 Allergy status to sulfonamides status: Secondary | ICD-10-CM

## 2024-12-11 DIAGNOSIS — Z8419 Family history of other disorders of kidney and ureter: Secondary | ICD-10-CM

## 2024-12-11 LAB — CBC WITH DIFFERENTIAL/PLATELET
Abs Immature Granulocytes: 0.04 10*3/uL (ref 0.00–0.07)
Basophils Absolute: 0.1 10*3/uL (ref 0.0–0.1)
Basophils Relative: 1 %
Eosinophils Absolute: 0.1 10*3/uL (ref 0.0–0.5)
Eosinophils Relative: 1 %
HCT: 44.3 % (ref 36.0–46.0)
Hemoglobin: 14.6 g/dL (ref 12.0–15.0)
Immature Granulocytes: 1 %
Lymphocytes Relative: 21 %
Lymphs Abs: 1.7 10*3/uL (ref 0.7–4.0)
MCH: 32.1 pg (ref 26.0–34.0)
MCHC: 33 g/dL (ref 30.0–36.0)
MCV: 97.4 fL (ref 80.0–100.0)
Monocytes Absolute: 0.5 10*3/uL (ref 0.1–1.0)
Monocytes Relative: 6 %
Neutro Abs: 5.9 10*3/uL (ref 1.7–7.7)
Neutrophils Relative %: 70 %
Platelets: 189 10*3/uL (ref 150–400)
RBC: 4.55 MIL/uL (ref 3.87–5.11)
RDW: 14 % (ref 11.5–15.5)
WBC: 8.2 10*3/uL (ref 4.0–10.5)
nRBC: 0 % (ref 0.0–0.2)

## 2024-12-11 LAB — BASIC METABOLIC PANEL WITH GFR
Anion gap: 13 (ref 5–15)
BUN: 49 mg/dL — ABNORMAL HIGH (ref 8–23)
CO2: 23 mmol/L (ref 22–32)
Calcium: 9.2 mg/dL (ref 8.9–10.3)
Chloride: 103 mmol/L (ref 98–111)
Creatinine, Ser: 1.38 mg/dL — ABNORMAL HIGH (ref 0.44–1.00)
GFR, Estimated: 38 mL/min — ABNORMAL LOW
Glucose, Bld: 158 mg/dL — ABNORMAL HIGH (ref 70–99)
Potassium: 5.2 mmol/L — ABNORMAL HIGH (ref 3.5–5.1)
Sodium: 140 mmol/L (ref 135–145)

## 2024-12-11 LAB — PROTIME-INR
INR: 0.9 (ref 0.8–1.2)
Prothrombin Time: 13.1 s (ref 11.4–15.2)

## 2024-12-11 LAB — TYPE AND SCREEN
ABO/RH(D): AB POS
Antibody Screen: NEGATIVE

## 2024-12-11 LAB — CBG MONITORING, ED: Glucose-Capillary: 169 mg/dL — ABNORMAL HIGH (ref 70–99)

## 2024-12-11 MED ORDER — LACTATED RINGERS IV SOLN: INTRAVENOUS | Status: DC

## 2024-12-11 MED ORDER — HYDROMORPHONE HCL 1 MG/ML IJ SOLN
0.5000 mg | INTRAMUSCULAR | Status: DC | PRN
  Administered 2024-12-11 – 2024-12-12 (×5): 0.5 mg via INTRAVENOUS
  Filled 2024-12-11 (×6): qty 1

## 2024-12-11 MED ORDER — ONDANSETRON HCL 4 MG/2ML IJ SOLN
4.0000 mg | Freq: Four times a day (QID) | INTRAMUSCULAR | Status: DC | PRN
  Administered 2024-12-12: 4 mg via INTRAVENOUS

## 2024-12-11 MED ORDER — FENTANYL CITRATE (PF) 50 MCG/ML IJ SOSY
50.0000 ug | PREFILLED_SYRINGE | INTRAMUSCULAR | Status: AC | PRN
  Administered 2024-12-11 (×2): 50 ug via INTRAVENOUS
  Filled 2024-12-11 (×2): qty 1

## 2024-12-11 MED ORDER — INSULIN ASPART 100 UNIT/ML IJ SOLN
0.0000 [IU] | Freq: Three times a day (TID) | INTRAMUSCULAR | Status: DC
  Administered 2024-12-12: 3 [IU] via SUBCUTANEOUS
  Administered 2024-12-13: 2 [IU] via SUBCUTANEOUS
  Administered 2024-12-13 – 2024-12-14 (×3): 3 [IU] via SUBCUTANEOUS
  Filled 2024-12-11: qty 3
  Filled 2024-12-11: qty 2
  Filled 2024-12-11 (×3): qty 3

## 2024-12-11 MED ORDER — ONDANSETRON HCL 4 MG PO TABS: 4.0000 mg | ORAL_TABLET | Freq: Four times a day (QID) | ORAL | Status: DC | PRN

## 2024-12-11 MED ORDER — LACTATED RINGERS IV SOLN: INTRAVENOUS | Status: AC

## 2024-12-11 MED ORDER — PANTOPRAZOLE SODIUM 20 MG PO TBEC
20.0000 mg | DELAYED_RELEASE_TABLET | Freq: Every day | ORAL | Status: DC
  Administered 2024-12-11 – 2024-12-14 (×4): 20 mg via ORAL
  Filled 2024-12-11 (×4): qty 1

## 2024-12-11 MED ORDER — INSULIN ASPART 100 UNIT/ML IJ SOLN
0.0000 [IU] | Freq: Every day | INTRAMUSCULAR | Status: DC
  Filled 2024-12-11: qty 3

## 2024-12-11 MED ORDER — ONDANSETRON HCL 4 MG/2ML IJ SOLN
4.0000 mg | Freq: Once | INTRAMUSCULAR | Status: AC
  Administered 2024-12-11: 4 mg via INTRAVENOUS
  Filled 2024-12-11: qty 2

## 2024-12-11 MED ORDER — ATORVASTATIN CALCIUM 10 MG PO TABS
10.0000 mg | ORAL_TABLET | Freq: Every day | ORAL | Status: DC
  Administered 2024-12-11 – 2024-12-14 (×4): 10 mg via ORAL
  Filled 2024-12-11 (×4): qty 1

## 2024-12-11 NOTE — H&P (Signed)
 " History and Physical    Patient: Margaret Ward FMW:[id number] DOB: 01/17/1941 DOA: 12/11/2024 DOS: the patient was seen and examined on 12/11/2024 PCP: Frann Mabel Mt, DO  Patient coming from: Home  Chief Complaint:  Chief Complaint  Patient presents with   Fall   HPI: Margaret Ward is a 84 y.o. female with medical history significant of coronary artery disease, chronic diastolic heart failure, chronic kidney disease stage III, type 2 diabetes, frequent falls, GERD, gout, essential hypertension, hyperlipidemia, who was brought to the ER after sustaining a mechanical fall in her bathroom.  She slipped and fell and landed on her left side.  Patient sustained pain immediately was unable to move her legs.  She was brought to the ER where she was seen and evaluated.  Patient was found to have closed left femoral neck fracture.  Dr. Fidel of orthopedics consulted and recommends admission, n.p.o. after midnight and they will see the patient tomorrow.  Review of Systems: As mentioned in the history of present illness. All other systems reviewed and are negative. Past Medical History:  Diagnosis Date   Anemia    Anemia due to chronic blood loss 02/05/2021   Arthritis    hands, arms, all over the place (03/02/2018)   Bilateral carpal tunnel syndrome 10/11/2019   Bronchospasm 03/28/2018   CAD (coronary artery disease) 02/08/2018   Left heart cath 01/13/18: 40% distal LMCAS 90% proximal LAD  LCF with 90% proximal ramus stenosis, diffuse RCA, 60% distal and 90% proximal PDA stenosis   CAD in native artery    a. s/p CABG in 01/2018. b. Recurrent CP 02/2018 -> cath showing early graft closure SVG-diagonal, received DES to D1.   CAD/s/p CABG Mar 2019 w/ DES to vein graft Apr 2019 05/21/2018   Cameron ulcer, acute 05/27/2018   Carpal tunnel syndrome, bilateral 12/28/2018   Chest pain 02/05/2018   Chronic combined systolic and diastolic CHF (congestive heart failure) (HCC)    a.  LVEF 40-45% by echo 02/2018.   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 s/p CABG in 01/2018. b. Recurrent CP 02/2018 -> cath showing early graft closure SVG-diagonal, received DES to D1.   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   Essential hypertension 05/21/2018   Falls frequently 09/04/2021   Gastric ulcer, unspecified as acute or chronic, without hemorrhage or perforation 05/27/2018   Gastroesophageal reflux disease 05/21/2018   Glass eye    right eye   Gout    on daily RX (03/02/2018)   Heme positive stool 05/21/2018   History of anemia 02/05/2021   History of cardiomyopathy 10/08/2022   History of cholecystectomy 02/05/2021   History of kidney stones    HLD (hyperlipidemia) 05/21/2018   Hyperlipidemia    Hypertension    Hypertensive heart disease    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   Kidney cysts    Kidney disease    Left ventricular aneurysm    Migraine    used to get them twice/week; haven't had them in a long time (03/02/2018)   Mixed dyslipidemia 10/08/2022   NSTEMI (non-ST elevated myocardial infarction) (HCC) 03/01/2018  Obesity (BMI 30-39.9) 07/25/2020   On home oxygen therapy    3L at night and prn during the day (03/02/2018)   Physical deconditioning 10/24/2020   Pneumonia    used to get it twice/year; stopped after I had hysterectomy (03/02/2018)   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   Senile purpura 09/04/2021   Sinus tachycardia 03/09/2018   Sleep apnea    SOB (shortness of breath) 03/09/2018   Symptomatic anemia 05/21/2018   Type II diabetes mellitus (HCC)    Past Surgical History:  Procedure Laterality  Date   BACK SURGERY     BIOPSY  05/22/2018   Procedure: BIOPSY;  Surgeon: Elicia Claw, MD;  Location: [facility];  Service: Gastroenterology;;   CARDIAC CATHETERIZATION  12/2017   before [facility]   CHOLECYSTECTOMY OPEN  1982   CORONARY ARTERY BYPASS GRAFT  01/19/2018   CABG X3; in [place]    CORONARY STENT INTERVENTION N/A 03/02/2018   Procedure: CORONARY STENT INTERVENTION;  Surgeon: Verlin Lonni BIRCH, MD;  Location: [facility];  Service: Cardiovascular;  Laterality: N/A;   DILATION AND CURETTAGE OF UTERUS     ESOPHAGOGASTRODUODENOSCOPY (EGD) WITH PROPOFOL  N/A 05/22/2018   Procedure: ESOPHAGOGASTRODUODENOSCOPY (EGD) WITH PROPOFOL ;  Surgeon: Elicia Claw, MD;  Location: [facility];  Service: Gastroenterology;  Laterality: N/A;   ESOPHAGOGASTRODUODENOSCOPY (EGD) WITH PROPOFOL  N/A 05/24/2018   Procedure: ESOPHAGOGASTRODUODENOSCOPY (EGD) WITH PROPOFOL ;  Surgeon: Elicia Claw, MD;  Location: [facility];  Service: Gastroenterology;  Laterality: N/A;   EYE SURGERY Right    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   KNEE ARTHROSCOPY Right X 4   LEFT HEART CATH AND CORS/GRAFTS ANGIOGRAPHY N/A 03/02/2018   Procedure: LEFT HEART CATH AND CORS/GRAFTS ANGIOGRAPHY;  Surgeon: Verlin Lonni BIRCH, MD;  Location: [facility];  Service: Cardiovascular;  Laterality: N/A;   LEFT HEART CATH AND CORS/GRAFTS ANGIOGRAPHY N/A 06/13/2020   Procedure: LEFT HEART CATH AND CORS/GRAFTS ANGIOGRAPHY;  Surgeon: Verlin Lonni BIRCH, MD;  Location: [facility];  Service: Cardiovascular;  Laterality: N/A;   LUMBAR DISC SURGERY     REDUCTION MAMMAPLASTY Bilateral X 2   SCHLEROTHERAPY  05/24/2018   Procedure: SCHLEROTHERAPY;  Surgeon: Elicia Claw, MD;  Location: [facility];  Service: Gastroenterology;;   TONSILLECTOMY  1953   VAGINAL HYSTERECTOMY  1970   Social History:  reports that she quit smoking about 63 years ago. Her smoking use included cigarettes. She started smoking about  66 years ago. She has a 0.4 pack-year smoking history. She has never used smokeless tobacco. She reports that she does not currently use alcohol. She reports that she does not use drugs.  Allergies[1]  Family History  Problem Relation Age of Onset   Heart Problems Mother    Diabetes Father    Kidney disease Sister    Heart disease Brother    Hypertension Brother    Kidney disease Brother    Hypertension Daughter    Hypertension Daughter    Hypertension Daughter    Hypertension Daughter     Prior to Admission medications  Medication Sig Start Date End Date Taking? Authorizing Provider  empagliflozin  (JARDIANCE ) 25 MG TABS tablet Take 1 tablet (25 mg total) by mouth daily. 10/12/24   Frann Mabel Mt, DO  acetaminophen  (TYLENOL ) 500 MG tablet Take 1,000 mg by mouth every 6 (six) hours as needed for headache.     [provider]  allopurinol  (ZYLOPRIM ) 100 MG tablet TAKE 1 TABLET(100 MG) BY MOUTH DAILY 02/24/24   Frann, Mabel Mt,  DO  atorvastatin  (LIPITOR) 10 MG tablet Take 1 tablet (10 mg total) by mouth daily. 09/15/24   Revankar, Jennifer SAUNDERS, MD  clopidogrel  (PLAVIX ) 75 MG tablet Take 1 tablet (75 mg total) by mouth daily. 08/17/24   Frann Mabel Mt, DO  furosemide  (LASIX ) 20 MG tablet TAKE 1 TABLET BY MOUTH DAILY. TAKE ADDITIONAL TABLET ON [facility] AND FRIDAY 08/19/23   Revankar, Rajan R, MD  metFORMIN  (GLUCOPHAGE -XR) 500 MG 24 hr tablet Take 1 tablet (500 mg total) by mouth daily with breakfast. 10/03/24   Frann Mabel Mt, DO  Multiple Vitamin (MULTIVITAMIN WITH MINERALS) TABS tablet Take 1 tablet by mouth daily.    [provider]  nitroGLYCERIN  (NITROSTAT ) 0.4 MG SL tablet Place 1 tablet (0.4 mg total) under the tongue every 5 (five) minutes x 3 doses as needed for chest pain. 03/03/18   [facility], Jon Garre, PA  pantoprazole  (PROTONIX ) 20 MG tablet TAKE 1 TABLET(20 MG) BY MOUTH DAILY 10/17/24   Wendling, Mabel Mt, DO   Pseudoeph-Doxylamine-DM-APAP (NYQUIL PO) Take 1 Dose by mouth at bedtime as needed (congestion).     [provider]  sacubitril -valsartan  (ENTRESTO ) 49-51 MG TAKE 1 TABLET BY MOUTH TWICE DAILY 05/27/24   Revankar, Jennifer SAUNDERS, MD  spironolactone  (ALDACTONE ) 25 MG tablet TAKE 1/2 TABLET(12.5 MG) BY MOUTH DAILY 05/27/24   Revankar, Rajan R, MD  venlafaxine  XR (EFFEXOR -XR) 75 MG 24 hr capsule TAKE 1 CAPSULE(75 MG) BY MOUTH DAILY WITH BREAKFAST 09/28/24   Wendling, Mabel Mt, DO  vitamin C  (ASCORBIC ACID ) 500 MG tablet Take 500 mg by mouth 2 (two) times daily.    [provider]    Physical Exam: Vitals:   12/11/24 1722 12/11/24 1849  BP: 122/70   Pulse: 91   Resp: (!) 23   Temp: 98.4 F (36.9 C)   TempSrc: Oral   SpO2: 92%   Weight:  60 kg  Height:  5' 2 (1.575 m)   Constitutional: Acutely ill looking, mild distress, calm, comfortable Eyes: PERRL, lids and conjunctivae normal ENMT: Mucous membranes are moist. Posterior pharynx clear of any exudate or lesions.Normal dentition.  Neck: normal, supple, no masses, no thyromegaly Respiratory: clear to auscultation bilaterally, no wheezing, no crackles. Normal respiratory effort. No accessory muscle use.  Cardiovascular: Regular rate and rhythm, no murmurs / rubs / gallops. No extremity edema. 2+ pedal pulses. No carotid bruits.  Abdomen: no tenderness, no masses palpated. No hepatosplenomegaly. Bowel sounds positive.  Musculoskeletal: Left lower extremity is shortened and laterally rotated, tender laterally  Skin: no rashes, lesions, ulcers. No induration Neurologic: CN 2-12 grossly intact. Sensation intact, DTR normal. Strength 5/5 in all 4.  Psychiatric: Normal judgment and insight. Alert and oriented x 3. Normal mood  Data Reviewed:  Vitals are stable.  White count 8.2 potassium 5.2 BUN 49 creatinine 1.38 and glucose 158.  CT cervical spine showed no evidence of acute traumatic injury.  Head CT without contrast showed  no acute findings.  Chest x-ray showed no acute findings and x-ray of the hip showed acute displaced and impacted left femoral neck fracture with varus angulation no dislocation.  No acute fracture of the right hip and pelvis  Assessment and Plan:  #1 acute left femoral neck fracture traumatic: Patient will be admitted.  Will keep n.p.o. after midnight.  No anticoagulation.  Patient will be seen by Dr. Fidel in a.m. and surgery may be planned for tomorrow.  Pain management.  SCDs for DVT prophylaxis  #2 status post fall:  Mechanical in nature.  Patient has had frequent falls in the past.  Will need PT and OT and disposition after surgery  #3 type 2 diabetes: Continue sliding scale insulin .  Continue monitor  #4 essential hypertension: Blood pressure at this point is stable.  Continue home regimen.  #5 coronary artery disease: Stable.  Continue home regimen  #6 chronic diastolic heart failure: Compensated  #7 hyperkalemia: Will hold Aldactone .  #8 hyperlipidemia: Continue statin  #9 GERD: Continue with PPIs    Advance Care Planning:   Code Status: Full Code   Consults: Dr. Fidel, orthopedic surgery  Family Communication: No family at bedside  Severity of Illness: The appropriate patient status for this patient is INPATIENT. Inpatient status is judged to be reasonable and necessary in order to provide the required intensity of service to ensure the patient's safety. The patient's presenting symptoms, physical exam findings, and initial radiographic and laboratory data in the context of their chronic comorbidities is felt to place them at high risk for further clinical deterioration. Furthermore, it is not anticipated that the patient will be medically stable for discharge from the hospital within 2 midnights of admission.   * I certify that at the point of admission it is my clinical judgment that the patient will require inpatient hospital care spanning beyond 2 midnights from the  point of admission due to high intensity of service, high risk for further deterioration and high frequency of surveillance required.*  AuthorBETHA SIM KNOLL, MD 12/11/2024 7:00 PM  For on call review [email or web address].     [1]  Allergies Allergen Reactions   Methylprednisolone     Severe cramps legs and arms   Keflex [Cephalexin] Hives   Ketorolac  Swelling   Morphine  And Codeine  Itching   Novocain [Procaine] Swelling   Ramipril Other (See Comments)   Septra [Sulfamethoxazole-Trimethoprim] Nausea And Vomiting   "

## 2024-12-11 NOTE — ED Triage Notes (Signed)
 Pt BIB [facility] from home for fall on thinners. Pt reports leg gave out and she fell against the wall, no LOC, did hit head. Pt reports severe L hip pain, no other pain reported. Pt states she does take blood thinners. VSS per EMS.

## 2024-12-11 NOTE — Progress Notes (Signed)
 Orthopedic Tech Progress Note Patient Details:  Margaret Ward 01-05-1941 [id number]  Patient ID: [name] Pac, female   DOB: 04/08/1941, 84 y.o.   MRN: [id number] Trauma level 2, await MD recommendations secondary to fall with left hip pain.   Naomee Nowland OTR/L 12/11/2024, 5:24 PM

## 2024-12-11 NOTE — ED Provider Notes (Signed)
 " [place] EMERGENCY DEPARTMENT AT [facility] Provider Note   CSN: [id number] Arrival date & time: 12/11/24  1707     Patient presents with: Margaret Ward is a 84 y.o. female.   Patient is an 84 year old female with a history of hypertension, hyperlipidemia, CAD status post CABG, diabetes, kidney disease, CHF with reduced EF on Entresto  who is presenting today after a fall at home.  She reports she was going into her bathroom when her right leg gave out on her causing her to fall backwards hitting her head on the wall and the floor and landing on her left hip.  Since that time she has had severe pain in her left hip and has not been able to stand or ambulate.  She did not have any loss of consciousness denies any neck pain.  Reports she was in her normal state of health prior to this.  She does take Plavix  no other anticoagulation.  The history is provided by the patient, the EMS personnel and medical records.       Prior to Admission medications  Medication Sig Start Date End Date Taking? Authorizing Provider  empagliflozin  (JARDIANCE ) 25 MG TABS tablet Take 1 tablet (25 mg total) by mouth daily. 10/12/24   Frann Mabel Mt, DO  acetaminophen  (TYLENOL ) 500 MG tablet Take 1,000 mg by mouth every 6 (six) hours as needed for headache.     [provider]  allopurinol  (ZYLOPRIM ) 100 MG tablet TAKE 1 TABLET(100 MG) BY MOUTH DAILY 02/24/24   Wendling, Mabel Mt, DO  atorvastatin  (LIPITOR) 10 MG tablet Take 1 tablet (10 mg total) by mouth daily. 09/15/24   Revankar, Jennifer SAUNDERS, MD  clopidogrel  (PLAVIX ) 75 MG tablet Take 1 tablet (75 mg total) by mouth daily. 08/17/24   Frann Mabel Mt, DO  furosemide  (LASIX ) 20 MG tablet TAKE 1 TABLET BY MOUTH DAILY. TAKE ADDITIONAL TABLET ON [facility] AND FRIDAY 08/19/23   Revankar, Rajan R, MD  metFORMIN  (GLUCOPHAGE -XR) 500 MG 24 hr tablet Take 1 tablet (500 mg total) by mouth daily with breakfast. 10/03/24    Frann Mabel Mt, DO  Multiple Vitamin (MULTIVITAMIN WITH MINERALS) TABS tablet Take 1 tablet by mouth daily.    [provider]  nitroGLYCERIN  (NITROSTAT ) 0.4 MG SL tablet Place 1 tablet (0.4 mg total) under the tongue every 5 (five) minutes x 3 doses as needed for chest pain. 03/03/18   Madie Jon Garre, PA  pantoprazole  (PROTONIX ) 20 MG tablet TAKE 1 TABLET(20 MG) BY MOUTH DAILY 10/17/24   Wendling, Mabel Mt, DO  Pseudoeph-Doxylamine-DM-APAP (NYQUIL PO) Take 1 Dose by mouth at bedtime as needed (congestion).     [provider]  sacubitril -valsartan  (ENTRESTO ) 49-51 MG TAKE 1 TABLET BY MOUTH TWICE DAILY 05/27/24   Revankar, Rajan R, MD  spironolactone  (ALDACTONE ) 25 MG tablet TAKE 1/2 TABLET(12.5 MG) BY MOUTH DAILY 05/27/24   Revankar, Rajan R, MD  venlafaxine  XR (EFFEXOR -XR) 75 MG 24 hr capsule TAKE 1 CAPSULE(75 MG) BY MOUTH DAILY WITH BREAKFAST 09/28/24   Wendling, Mabel Mt, DO  vitamin C  (ASCORBIC ACID ) 500 MG tablet Take 500 mg by mouth 2 (two) times daily.    [provider]    Allergies: Methylprednisolone, Keflex [cephalexin], Ketorolac , Morphine  and codeine , Novocain [procaine], Ramipril, and Septra [sulfamethoxazole-trimethoprim]    Review of Systems  Updated Vital Signs BP 122/70   Pulse 91   Temp 98.4 F (36.9 C) (Oral)   Resp (!) 23  SpO2 92%   Physical Exam Vitals and nursing note reviewed.  Constitutional:      General: She is not in acute distress.    Appearance: She is well-developed.  HENT:     Head: Normocephalic and atraumatic.  Eyes:     Pupils: Pupils are equal, round, and reactive to light.  Cardiovascular:     Rate and Rhythm: Normal rate and regular rhythm.     Pulses: Normal pulses.     Heart sounds: Normal heart sounds. No murmur heard.    No friction rub.  Pulmonary:     Effort: Pulmonary effort is normal.     Breath sounds: Normal breath sounds. No wheezing or rales.  Chest:     Chest wall: No  tenderness.  Abdominal:     General: Bowel sounds are normal. There is no distension.     Palpations: Abdomen is soft.     Tenderness: There is no abdominal tenderness. There is no guarding or rebound.  Musculoskeletal:        General: Tenderness present.     Cervical back: Normal range of motion and neck supple.     Left hip: Deformity and tenderness present. Decreased range of motion.     Right lower leg: No edema.     Left lower leg: No edema.     Comments: No edema.  Leg is shortened and externally rotated.  Normal sensation in the left foot and normal movement of the foot  Skin:    General: Skin is warm and dry.     Findings: No rash.  Neurological:     Mental Status: She is alert and oriented to person, place, and time. Mental status is at baseline.     Cranial Nerves: No cranial nerve deficit.  Psychiatric:        Behavior: Behavior normal.     (all labs ordered are listed, but only abnormal results are displayed) Labs Reviewed  BASIC METABOLIC PANEL WITH GFR - Abnormal; Notable for the following components:      Result Value   Potassium 5.2 (*)    Glucose, Bld 158 (*)    BUN 49 (*)    Creatinine, Ser 1.38 (*)    GFR, Estimated 38 (*)    All other components within normal limits  CBC WITH DIFFERENTIAL/PLATELET  PROTIME-INR  TYPE AND SCREEN    EKG: None  Radiology: CT CERVICAL SPINE WO CONTRAST Result Date: 12/11/2024 EXAM: CT CERVICAL SPINE WITHOUT CONTRAST 12/11/2024 05:49:04 PM TECHNIQUE: CT of the cervical spine was performed without the administration of intravenous contrast. Multiplanar reformatted images are provided for review. Automated exposure control, iterative reconstruction, and/or weight based adjustment of the mA/kV was utilized to reduce the radiation dose to as low as reasonably achievable. COMPARISON: 02/06/22 CLINICAL HISTORY: Polytrauma, blunt. FINDINGS: BONES AND ALIGNMENT: No acute fracture or traumatic malalignment. DEGENERATIVE CHANGES:  Multilevel mild-to-moderate degenerative changes of the spine. Posterior longitudinal ligament and ligamentum flavum calcifications. No associated severe osseous neural foraminal or central canal stenosis. SOFT TISSUES: No prevertebral soft tissue swelling. Atherosclerotic plaques in the carotid arteries within the neck. Subcentimeter hypodense nodules within the thyroid gland. Calcified 6 mm left thyroid gland nodule. IMPRESSION: 1. No evidence of acute traumatic injury. Electronically signed by: Morgane Naveau MD 12/11/2024 05:57 PM EST RP Workstation: [id number]   CT HEAD WO CONTRAST Result Date: 12/11/2024 EXAM: CT HEAD WITHOUT CONTRAST 12/11/2024 05:49:04 PM TECHNIQUE: CT of the head was performed without the administration of intravenous contrast. Automated  exposure control, iterative reconstruction, and/or weight based adjustment of the mA/kV was utilized to reduce the radiation dose to as low as reasonably achievable. COMPARISON: 12/12/2022 CLINICAL HISTORY: Head trauma, minor (Age >= 65 years). FINDINGS: BRAIN AND VENTRICLES: No acute hemorrhage. No evidence of acute infarct. No hydrocephalus. No extra-axial collection. No mass effect or midline shift. Left MCA aneurysmal coiling and stent. Patchy and confluent areas of decreased attenuation are noted throughout the deep and periventricular white matter of the cerebral hemispheres bilaterally suggestive of chronic microvascular ischemic changes. Streak artifact limiting evaluation. ORBITS: Right pthisis bulbi noted. Surgical changes of bilateral globes. SINUSES: No acute abnormality. SOFT TISSUES AND SKULL: No acute soft tissue abnormality. No skull fracture. IMPRESSION: 1. No acute intracranial abnormality. 2. Left MCA aneurysmal coiling and stent. Electronically signed by: Morgane Naveau MD 12/11/2024 05:55 PM EST RP Workstation: [id number]   DG Hip Unilat With Pelvis 2-3 Views Left Result Date: 12/11/2024 EXAM: 2 or 3 VIEW(S) XRAY OF THE LEFT AND  RIGHT HIP 12/11/2024 05:45:08 PM COMPARISON: None available. CLINICAL HISTORY: Medical clearance. FINDINGS: BONES AND JOINTS: Acute displaced and impacted left femoral neck fracture with varus angulation. No left hip dislocation. No acute displaced fracture or dislocation of the right hip. No acute displaced fracture or dislocation of the pelvis. Degenerative changes in lower lumbar spine. SOFT TISSUES: Surgical clips in right upper abdomen. Calcification within the left mid-abdomen of unclear etiology. Atherosclerotic calcifications in abdominal aorta. IMPRESSION: 1. Acute displaced and impacted left femoral neck fracture with varus angulation, without left hip dislocation. 2. No acute displaced fracture or dislocation of the right hip or pelvis. Electronically signed by: Morgane Naveau MD 12/11/2024 05:50 PM EST RP Workstation: [id number]   DG Chest Port 1 View Result Date: 12/11/2024 EXAM: 1 VIEW(S) XRAY OF THE CHEST 12/11/2024 05:45:08 PM COMPARISON: CT chest 12/12/2018, chest x-ray 09/30/2022. CLINICAL HISTORY: Medical clearance. FINDINGS: LUNGS AND PLEURA: Probable atelectasis or scarring at left lung base. No pleural effusion. No pneumothorax. HEART AND MEDIASTINUM: Surgical changes in mediastinum. BONES AND SOFT TISSUES: Sternotomy wires noted. Degenerative changes of the right shoulder. IMPRESSION: 1. No acute findings. Electronically signed by: Morgane Naveau MD 12/11/2024 05:49 PM EST RP Workstation: [id number]     Procedures   Medications Ordered in the ED  fentaNYL  (SUBLIMAZE ) injection 50 mcg (50 mcg Intravenous Given 12/11/24 1814)  ondansetron  (ZOFRAN ) injection 4 mg (4 mg Intravenous Given 12/11/24 1814)                                    Medical Decision Making Amount and/or Complexity of Data Reviewed Independent Historian: EMS External Data Reviewed: notes. Labs: ordered. Decision-making details documented in ED Course. Radiology: ordered and independent interpretation  performed. Decision-making details documented in ED Course.  Risk Prescription drug management. Decision regarding hospitalization.   Pt with multiple medical problems and comorbidities and presenting today with a complaint that caries a high risk for morbidity and mortality.  Patient presenting today as a fall on thinners.  Patient fell in her bathroom appears to be caused by her right leg giving out.  She did hit her head and does take Plavix .  She is mentating normally and neurologically intact.  She has severe hip pain and concern for fracture.  Lower suspicion for dislocation.  Hip fracture protocol initiated.  Patient given pain control. I independently interpreted patient's labs and CBC within normal limits, BMP with mild elevated creatinine  of 1.38 which is at patient's baseline, coags are within normal limits.  I have independently visualized and interpreted pt's images today.  CT head negative for bleed and cervical spine negative for fracture.  Radiology reports no acute intracranial abnormality and left MCA aneurysmal coiling and stent are still in place, chest x-ray within normal limits, hip film with evidence of left hip fracture.  Patient has an acute displaced and impacted left femoral neck fracture with angulation but no evidence of hip dislocation.  All the findings were discussed with the patient.  She will require admission and repair which she desires.  Last had her Plavix  this morning. Spoke with Dr. Fidel with orthopedic surgery who recommended n.p.o. after midnight and hold all chemical DVT prophylaxis and they will see her in the morning and schedule the surgery.      Final diagnoses:  Fall, initial encounter  Left displaced femoral neck fracture [facility])    ED Discharge Orders     None          Doretha Folks, MD 12/11/24 1848  "

## 2024-12-12 ENCOUNTER — Inpatient Hospital Stay (HOSPITAL_COMMUNITY)

## 2024-12-12 ENCOUNTER — Inpatient Hospital Stay (HOSPITAL_COMMUNITY): Admitting: Anesthesiology

## 2024-12-12 ENCOUNTER — Encounter (HOSPITAL_COMMUNITY): Payer: Self-pay | Admitting: Internal Medicine

## 2024-12-12 ENCOUNTER — Encounter (HOSPITAL_COMMUNITY)
Admission: EM | Disposition: A | Discharge status: Skilled Nursing Facility | Payer: Self-pay | Source: Home / Self Care | Attending: Internal Medicine

## 2024-12-12 DIAGNOSIS — I5042 Chronic combined systolic (congestive) and diastolic (congestive) heart failure: Secondary | ICD-10-CM

## 2024-12-12 DIAGNOSIS — S72002A Fracture of unspecified part of neck of left femur, initial encounter for closed fracture: Secondary | ICD-10-CM | POA: Diagnosis not present

## 2024-12-12 DIAGNOSIS — I2510 Atherosclerotic heart disease of native coronary artery without angina pectoris: Secondary | ICD-10-CM

## 2024-12-12 DIAGNOSIS — Z87891 Personal history of nicotine dependence: Secondary | ICD-10-CM

## 2024-12-12 DIAGNOSIS — I110 Hypertensive heart disease with heart failure: Secondary | ICD-10-CM | POA: Diagnosis not present

## 2024-12-12 LAB — GLUCOSE, CAPILLARY
Glucose-Capillary: 90 mg/dL (ref 70–99)
Glucose-Capillary: 173 mg/dL — ABNORMAL HIGH (ref 70–99)
Glucose-Capillary: 180 mg/dL — ABNORMAL HIGH (ref 70–99)

## 2024-12-12 LAB — CBG MONITORING, ED
Glucose-Capillary: 163 mg/dL — ABNORMAL HIGH (ref 70–99)
Glucose-Capillary: 95 mg/dL (ref 70–99)

## 2024-12-12 LAB — SURGICAL PCR SCREEN
MRSA, PCR: NEGATIVE
Staphylococcus aureus: NEGATIVE

## 2024-12-12 LAB — BASIC METABOLIC PANEL WITH GFR
Anion gap: 14 (ref 5–15)
BUN: 37 mg/dL — ABNORMAL HIGH (ref 8–23)
CO2: 21 mmol/L — ABNORMAL LOW (ref 22–32)
Calcium: 8.2 mg/dL — ABNORMAL LOW (ref 8.9–10.3)
Chloride: 101 mmol/L (ref 98–111)
Creatinine, Ser: 1.19 mg/dL — ABNORMAL HIGH (ref 0.44–1.00)
GFR, Estimated: 45 mL/min — ABNORMAL LOW
Glucose, Bld: 193 mg/dL — ABNORMAL HIGH (ref 70–99)
Potassium: 4.7 mmol/L (ref 3.5–5.1)
Sodium: 137 mmol/L (ref 135–145)

## 2024-12-12 MED ORDER — ONDANSETRON HCL 4 MG/2ML IJ SOLN
INTRAMUSCULAR | Status: AC
  Filled 2024-12-12: qty 2

## 2024-12-12 MED ORDER — METHOCARBAMOL 500 MG PO TABS
500.0000 mg | ORAL_TABLET | Freq: Four times a day (QID) | ORAL | Status: DC | PRN
  Administered 2024-12-13: 500 mg via ORAL
  Filled 2024-12-12: qty 1

## 2024-12-12 MED ORDER — ROCURONIUM BROMIDE 10 MG/ML (PF) SYRINGE
PREFILLED_SYRINGE | INTRAVENOUS | Status: AC
  Filled 2024-12-12: qty 10

## 2024-12-12 MED ORDER — ONDANSETRON HCL 4 MG PO TABS: 4.0000 mg | ORAL_TABLET | Freq: Four times a day (QID) | ORAL | Status: DC | PRN

## 2024-12-12 MED ORDER — STERILE WATER FOR IRRIGATION IR SOLN
Status: DC | PRN
  Administered 2024-12-12: 1000 mL

## 2024-12-12 MED ORDER — PRONTOSAN WOUND IRRIGATION OPTIME
TOPICAL | Status: DC | PRN
  Administered 2024-12-12: 350 mL

## 2024-12-12 MED ORDER — CHLORHEXIDINE GLUCONATE 0.12 % MT SOLN: 15.0000 mL | Freq: Once | OROMUCOSAL | Status: AC

## 2024-12-12 MED ORDER — HYDROMORPHONE HCL 1 MG/ML IJ SOLN
INTRAMUSCULAR | Status: AC
  Filled 2024-12-12: qty 0.5

## 2024-12-12 MED ORDER — METHOCARBAMOL 1000 MG/10ML IJ SOLN: 500.0000 mg | Freq: Four times a day (QID) | INTRAMUSCULAR | Status: DC | PRN

## 2024-12-12 MED ORDER — FENTANYL CITRATE (PF) 100 MCG/2ML IJ SOLN
INTRAMUSCULAR | Status: AC
  Filled 2024-12-12: qty 2

## 2024-12-12 MED ORDER — POLYETHYLENE GLYCOL 3350 17 G PO PACK: 17.0000 g | PACK | Freq: Every day | ORAL | Status: DC | PRN

## 2024-12-12 MED ORDER — DIPHENHYDRAMINE HCL 12.5 MG/5ML PO ELIX: 12.5000 mg | ORAL_SOLUTION | ORAL | Status: DC | PRN

## 2024-12-12 MED ORDER — BISACODYL 10 MG RE SUPP: 10.0000 mg | Freq: Every day | RECTAL | Status: DC | PRN

## 2024-12-12 MED ORDER — ACETAMINOPHEN 325 MG PO TABS
325.0000 mg | ORAL_TABLET | Freq: Four times a day (QID) | ORAL | Status: DC | PRN
  Administered 2024-12-13: 650 mg via ORAL
  Filled 2024-12-12: qty 2

## 2024-12-12 MED ORDER — MENTHOL 3 MG MT LOZG: 1.0000 | LOZENGE | OROMUCOSAL | Status: DC | PRN

## 2024-12-12 MED ORDER — CHLORHEXIDINE GLUCONATE 4 % EX SOLN: 60.0000 mL | Freq: Once | CUTANEOUS | Status: DC

## 2024-12-12 MED ORDER — BUPIVACAINE-EPINEPHRINE (PF) 0.5% -1:200000 IJ SOLN
INTRAMUSCULAR | Status: AC
  Filled 2024-12-12: qty 30

## 2024-12-12 MED ORDER — ONDANSETRON HCL 4 MG/2ML IJ SOLN: 4.0000 mg | Freq: Four times a day (QID) | INTRAMUSCULAR | Status: DC | PRN

## 2024-12-12 MED ORDER — HYDROCODONE-ACETAMINOPHEN 5-325 MG PO TABS
1.0000 | ORAL_TABLET | ORAL | Status: DC | PRN
  Administered 2024-12-13: 1 via ORAL
  Filled 2024-12-12 (×2): qty 1

## 2024-12-12 MED ORDER — SODIUM CHLORIDE 0.9 % IV SOLN: INTRAVENOUS | Status: DC

## 2024-12-12 MED ORDER — ACETAMINOPHEN 10 MG/ML IV SOLN
INTRAVENOUS | Status: DC | PRN
  Administered 2024-12-12: 1000 mg via INTRAVENOUS

## 2024-12-12 MED ORDER — METOCLOPRAMIDE HCL 5 MG/ML IJ SOLN: 5.0000 mg | Freq: Three times a day (TID) | INTRAMUSCULAR | Status: DC | PRN

## 2024-12-12 MED ORDER — SENNA 8.6 MG PO TABS
1.0000 | ORAL_TABLET | Freq: Two times a day (BID) | ORAL | Status: DC
  Administered 2024-12-12 – 2024-12-14 (×4): 8.6 mg via ORAL
  Filled 2024-12-12 (×4): qty 1

## 2024-12-12 MED ORDER — POVIDONE-IODINE 10 % EX SWAB
2.0000 | Freq: Once | CUTANEOUS | Status: AC
  Administered 2024-12-12: 2 via TOPICAL

## 2024-12-12 MED ORDER — LEVOFLOXACIN IN D5W 500 MG/100ML IV SOLN
500.0000 mg | INTRAVENOUS | Status: AC
  Administered 2024-12-12: 500 mg via INTRAVENOUS
  Filled 2024-12-12: qty 100

## 2024-12-12 MED ORDER — METOCLOPRAMIDE HCL 5 MG PO TABS: 5.0000 mg | ORAL_TABLET | Freq: Three times a day (TID) | ORAL | Status: DC | PRN

## 2024-12-12 MED ORDER — DOCUSATE SODIUM 100 MG PO CAPS
100.0000 mg | ORAL_CAPSULE | Freq: Two times a day (BID) | ORAL | Status: DC
  Administered 2024-12-12 – 2024-12-14 (×4): 100 mg via ORAL
  Filled 2024-12-12 (×4): qty 1

## 2024-12-12 MED ORDER — SUGAMMADEX SODIUM 200 MG/2ML IV SOLN
INTRAVENOUS | Status: DC | PRN
  Administered 2024-12-12: 200 mg via INTRAVENOUS

## 2024-12-12 MED ORDER — DEXAMETHASONE SOD PHOSPHATE PF 10 MG/ML IJ SOLN
INTRAMUSCULAR | Status: AC
  Filled 2024-12-12: qty 1

## 2024-12-12 MED ORDER — HYDROCODONE-ACETAMINOPHEN 5-325 MG PO TABS: 1.0000 | ORAL_TABLET | ORAL | 0 refills | Status: AC | PRN

## 2024-12-12 MED ORDER — PHENYLEPHRINE HCL-NACL 20-0.9 MG/250ML-% IV SOLN
INTRAVENOUS | Status: DC | PRN
  Administered 2024-12-12: 30 ug/min via INTRAVENOUS

## 2024-12-12 MED ORDER — LIDOCAINE 2% (20 MG/ML) 5 ML SYRINGE
INTRAMUSCULAR | Status: DC | PRN
  Administered 2024-12-12: 40 mg via INTRAVENOUS

## 2024-12-12 MED ORDER — PHENOL 1.4 % MT LIQD: 1.0000 | OROMUCOSAL | Status: DC | PRN

## 2024-12-12 MED ORDER — SODIUM CHLORIDE (PF) 0.9 % IJ SOLN
INTRAMUSCULAR | Status: AC
  Filled 2024-12-12: qty 50

## 2024-12-12 MED ORDER — ASPIRIN 81 MG PO CHEW
81.0000 mg | CHEWABLE_TABLET | Freq: Two times a day (BID) | ORAL | Status: DC
  Filled 2024-12-12 (×2): qty 1

## 2024-12-12 MED ORDER — PROPOFOL 10 MG/ML IV BOLUS
INTRAVENOUS | Status: DC | PRN
  Administered 2024-12-12: 70 ug via INTRAVENOUS

## 2024-12-12 MED ORDER — HYDROCODONE-ACETAMINOPHEN 7.5-325 MG PO TABS
1.0000 | ORAL_TABLET | ORAL | Status: DC | PRN
  Administered 2024-12-13: 1 via ORAL
  Filled 2024-12-12: qty 1

## 2024-12-12 MED ORDER — CHLORHEXIDINE GLUCONATE 0.12 % MT SOLN
OROMUCOSAL | Status: AC
  Administered 2024-12-12: 15 mL via OROMUCOSAL
  Filled 2024-12-12: qty 15

## 2024-12-12 MED ORDER — ORAL CARE MOUTH RINSE: 15.0000 mL | Freq: Once | OROMUCOSAL | Status: AC

## 2024-12-12 MED ORDER — TRANEXAMIC ACID-NACL 1000-0.7 MG/100ML-% IV SOLN
INTRAVENOUS | Status: AC
  Filled 2024-12-12: qty 100

## 2024-12-12 MED ORDER — MORPHINE SULFATE (PF) 2 MG/ML IV SOLN
0.5000 mg | INTRAVENOUS | Status: DC | PRN
  Administered 2024-12-13: 1 mg via INTRAVENOUS
  Filled 2024-12-12: qty 1

## 2024-12-12 MED ORDER — LACTATED RINGERS IV SOLN: INTRAVENOUS | Status: DC

## 2024-12-12 MED ORDER — SODIUM CHLORIDE 0.9 % IR SOLN
Status: DC | PRN
  Administered 2024-12-12 (×2): 1000 mL

## 2024-12-12 MED ORDER — ACETAMINOPHEN 10 MG/ML IV SOLN
INTRAVENOUS | Status: AC
  Filled 2024-12-12: qty 100

## 2024-12-12 MED ORDER — METHOCARBAMOL 1000 MG/10ML IJ SOLN: 500.0000 mg | Freq: Three times a day (TID) | INTRAMUSCULAR | Status: DC | PRN

## 2024-12-12 MED ORDER — VANCOMYCIN HCL IN DEXTROSE 1-5 GM/200ML-% IV SOLN: 1000.0000 mg | Freq: Two times a day (BID) | INTRAVENOUS | Status: AC

## 2024-12-12 MED ORDER — ASPIRIN 81 MG PO CHEW: 81.0000 mg | CHEWABLE_TABLET | Freq: Two times a day (BID) | ORAL | 0 refills | Status: AC

## 2024-12-12 MED ORDER — TRANEXAMIC ACID-NACL 1000-0.7 MG/100ML-% IV SOLN
1000.0000 mg | INTRAVENOUS | Status: AC
  Administered 2024-12-12: 1000 mg via INTRAVENOUS
  Filled 2024-12-12: qty 100

## 2024-12-12 MED ORDER — FENTANYL CITRATE (PF) 100 MCG/2ML IJ SOLN
INTRAMUSCULAR | Status: DC | PRN
  Administered 2024-12-12: 25 ug via INTRAVENOUS
  Administered 2024-12-12: 50 ug via INTRAVENOUS
  Administered 2024-12-12: 25 ug via INTRAVENOUS

## 2024-12-12 MED ORDER — VANCOMYCIN HCL IN DEXTROSE 1-5 GM/200ML-% IV SOLN
1000.0000 mg | INTRAVENOUS | Status: AC
  Administered 2024-12-12: 1000 mg via INTRAVENOUS
  Filled 2024-12-12: qty 200

## 2024-12-12 MED ORDER — LIDOCAINE 2% (20 MG/ML) 5 ML SYRINGE
INTRAMUSCULAR | Status: AC
  Filled 2024-12-12: qty 5

## 2024-12-12 MED ORDER — ALBUMIN HUMAN 5 % IV SOLN: INTRAVENOUS | Status: DC | PRN

## 2024-12-12 MED ORDER — INSULIN ASPART 100 UNIT/ML IJ SOLN: 0.0000 [IU] | INTRAMUSCULAR | Status: DC | PRN

## 2024-12-12 MED ORDER — PHENYLEPHRINE 80 MCG/ML (10ML) SYRINGE FOR IV PUSH (FOR BLOOD PRESSURE SUPPORT)
PREFILLED_SYRINGE | INTRAVENOUS | Status: DC | PRN
  Administered 2024-12-12 (×2): 80 ug via INTRAVENOUS

## 2024-12-12 MED ORDER — ROCURONIUM BROMIDE 100 MG/10ML IV SOLN
INTRAVENOUS | Status: DC | PRN
  Administered 2024-12-12: 20 mg via INTRAVENOUS
  Administered 2024-12-12: 40 mg via INTRAVENOUS

## 2024-12-12 MED ORDER — PHENYLEPHRINE 80 MCG/ML (10ML) SYRINGE FOR IV PUSH (FOR BLOOD PRESSURE SUPPORT)
PREFILLED_SYRINGE | INTRAVENOUS | Status: AC
  Filled 2024-12-12: qty 10

## 2024-12-12 MED ORDER — CLOPIDOGREL BISULFATE 75 MG PO TABS
75.0000 mg | ORAL_TABLET | Freq: Every day | ORAL | Status: DC
  Administered 2024-12-13 – 2024-12-14 (×2): 75 mg via ORAL
  Filled 2024-12-12 (×2): qty 1

## 2024-12-12 NOTE — Progress Notes (Signed)
" °  Progress Note   Patient: Margaret Ward FMW:[id number] DOB: Jan 01, 1941 DOA: 12/11/2024     1 DOS: the patient was seen and examined on 12/12/2024   Brief hospital course:  84 y.o. female with medical history significant of coronary artery disease, chronic diastolic heart failure, chronic kidney disease stage III, type 2 diabetes, frequent falls, GERD, gout, essential hypertension, hyperlipidemia, who was brought to the ER after sustaining a mechanical fall in her bathroom.  She slipped and fell and landed on her left side.  Patient sustained pain immediately was unable to move her legs.  She was brought to the ER where she was seen and evaluated.  Patient was found to have closed left femoral neck fracture.  Dr. Fidel of orthopedics consulted   Assessment and Plan:  Acute left femoral neck fracture, traumatic, POA:  No anticoagulation.   - Orthopedics on board -She has been kept n.p.o. after midnight for possible surgical intervention -Continue with intravenous Dilaudid  0.5 mg every 2 hours as needed for severe pain -Added intravenous Robaxin  500 mg every 8 hours for muscle spasms   Status post mechanical fall: Patient has had frequent falls in the past.  Will need PT and OT and disposition after surgery   Type 2 diabetes: Continue sliding scale insulin .  Continue monitor   Essential hypertension: Blood pressure at this point is stable.  Continue home regimen.   Coronary artery disease: Stable.  Continue home regimen   Chronic diastolic heart failure: NYHA class II.  Compensated   Hyperkalemia: Will hold Aldactone .   Hyperlipidemia: Continue statin   GERD: Continue with PPIs  Disposition: She was living at home with her husband.  She will likely need skilled nursing facility on discharge.     Subjective: She is complaining of severe left leg pain.  She said that she fell at home.  She lives at home with her husband.  Her grandson was present at the bedside.  She is awaiting  to see orthopedic surgeon.  Physical Exam: Vitals:   12/12/24 0630 12/12/24 0645 12/12/24 0657 12/12/24 0700  BP:   103/63 101/64  Pulse: 83 84 76 82  Resp:   18   Temp:   98.5 F (36.9 C)   TempSrc:      SpO2: 93% 93% 93% 94%  Weight:      Height:       Constitutional: NAD, calm, comfortable Eyes: Right eye blindness ENMT: Mucous membranes are moist. Posterior pharynx clear of any exudate or lesions.Normal dentition.  Neck: normal, supple, no masses, no thyromegaly Respiratory: clear to auscultation bilaterally, no wheezing, no crackles. Normal respiratory effort. No accessory muscle use.  Cardiovascular: Regular rate and rhythm, no murmurs / rubs / gallops. No extremity edema. 2+ pedal pulses. No carotid bruits.  Abdomen: no tenderness, no masses palpated. No hepatosplenomegaly. Bowel sounds positive.  Musculoskeletal: Left leg is shortened and externally rotated Skin: no rashes, lesions, ulcers. No induration Neurologic: CN 2-12 grossly intact.  Data Reviewed:  There are no new results to review at this time.  Family Communication: Grand son at the bedside  Disposition: Status is: Inpatient Remains inpatient appropriate because: left femur fracture  Planned Discharge Destination: Skilled nursing facility    Time spent: 40 minutes  Author: Deliliah Room, MD 12/12/2024 9:10 AM  For on call review [email or web address].  "

## 2024-12-12 NOTE — Progress Notes (Signed)
 Patient declined to take aspirin  stating she was told by her heart doctor not to take it.

## 2024-12-12 NOTE — Progress Notes (Signed)
 Orthopedic Tech Progress Note Patient Details:  Margaret Ward 15-Oct-1941 [id number]  Patient can not have over head frame due to age restrictions. 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   MRN: [id number]  Delanna LITTIE Pac 12/12/2024, 6:35 PM

## 2024-12-12 NOTE — ED Notes (Signed)
 Secured e chat was made to Dr. Sim for pt complaint of headache and right ankle pain.

## 2024-12-12 NOTE — Anesthesia Preprocedure Evaluation (Addendum)
 "                                  Anesthesia Evaluation  Patient identified by MRN, date of birth, ID band Patient awake    Reviewed: Allergy & Precautions, NPO status , Patient's Chart, lab work & pertinent test results  Airway Mallampati: II  TM Distance: >3 FB Neck ROM: Full    Dental  (+) Edentulous Upper, Missing, Dental Advisory Given, Upper Dentures   Pulmonary sleep apnea , pneumonia, former smoker   breath sounds clear to auscultation       Cardiovascular hypertension, + CAD, + Past MI, + CABG and +CHF  + Valvular Problems/Murmurs  Rhythm:Regular Rate:Normal  Echo 08/2023  1. Left ventricular ejection fraction, by estimation, is 50 to 55%. Left  ventricular ejection fraction by 2D MOD biplane is 55.9 %. The left  ventricle has low normal function. The left ventricle has no regional wall  motion abnormalities. Left ventricular diastolic parameters are indeterminate.   2. Right ventricular systolic function is moderately reduced. The right  ventricular size is normal. There is normal pulmonary artery systolic pressure.   3. The mitral valve is normal in structure. No evidence of mitral valve  regurgitation. No evidence of mitral stenosis.   4. The aortic valve is tricuspid. Aortic valve regurgitation is not  visualized. No aortic stenosis is present.   5. Aortic DTA ia NWV.   6. The inferior vena cava is normal in size with greater than 50%  respiratory variability, suggesting right atrial pressure of 3 mmHg.   Comparison(s): Echocardiogram done 03/25/22 showed an EF of 50-55%.   Cath 2021  Mid LM to Dist LM lesion is 40% stenosed.  Prox LAD lesion is 100% stenosed.  Previously placed Ost 1st Diag drug eluting stent is widely patent.  Balloon angioplasty was performed.  Ost RPDA lesion is 100% stenosed.  Prox RCA to Mid RCA lesion is 40% stenosed.  Ost Ramus to Ramus lesion is 30% stenosed.  LIMA and is normal in caliber.  Origin lesion is  100% stenosed.  SVG and is normal in caliber.   1. 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 The LAD is occluded proximally just beyond the takeoff of the early Diagonal branch. The entire LAD fills from the patent LIMA graft. The Diagonal branch (intermediate branch) has a patent stent with no restenosis. The vein graft that presumably went to this branch is known to be occluded and was not injected.  3. The Circumflex and another intermediate branch are small to moderate in caliber and have minor plaque, unchanged from last cath.  4. The RCA is a large dominant vessel. There is diffuse 40% stenosis in the proximal/mid and distal RCA. The PDA is small to moderate in caliber. The ostium of the PDA has a chronic occlusion. The PDA fills from the patent vein graft.      Neuro/Psych  Headaches  negative psych ROS   GI/Hepatic Neg liver ROS, PUD,GERD  ,,  Endo/Other  diabetes    Renal/GU Renal InsufficiencyRenal disease     Musculoskeletal  (+) Arthritis ,    Abdominal   Peds  Hematology  (+) Blood dyscrasia, anemia   Anesthesia Other Findings   Reproductive/Obstetrics negative OB ROS  Anesthesia Physical Anesthesia Plan  ASA: 3  Anesthesia Plan: General   Post-op Pain Management: Ofirmev  IV (intra-op)*   Induction: Intravenous  PONV Risk Score and Plan: 2 and Ondansetron , Dexamethasone  and Treatment may vary due to age or medical condition  Airway Management Planned: Oral ETT  Additional Equipment:   Intra-op Plan:   Post-operative Plan: Extubation in OR  Informed Consent: I have reviewed the patients History and Physical, chart, labs and discussed the procedure including the risks, benefits and alternatives for the proposed anesthesia with the patient or authorized representative who has indicated his/her understanding and acceptance.     Dental advisory given  Plan Discussed  with: CRNA  Anesthesia Plan Comments:          Anesthesia Quick Evaluation  "

## 2024-12-12 NOTE — Consult Note (Signed)
 Reason for Consult:Left hip fx Referring Physician: Deliliah Room Time called: [id number] Time at bedside: 0917   Margaret Ward is an 84 y.o. female.  HPI: Gerry's left leg gave way while she was at the sink and she fell. She had immediate left hip pain and could not get up. She was brought to the ED where x-rays showed a left hip fx and orthopedic surgery was consulted. She lives at home with her husband and uses a RW for ambulation.  Past Medical History:  Diagnosis Date   Anemia    Anemia due to chronic blood loss 02/05/2021   Arthritis    hands, arms, all over the place (03/02/2018)   Bilateral carpal tunnel syndrome 10/11/2019   Bronchospasm 03/28/2018   CAD (coronary artery disease) 02/08/2018   Left heart cath 01/13/18: 40% distal LMCAS 90% proximal LAD  LCF with 90% proximal ramus stenosis, diffuse RCA, 60% distal and 90% proximal PDA stenosis   CAD in native artery    a. s/p CABG in 01/2018. b. Recurrent CP 02/2018 -> cath showing early graft closure SVG-diagonal, received DES to D1.   CAD/s/p CABG Mar 2019 w/ DES to vein graft Apr 2019 05/21/2018   Cameron ulcer, acute 05/27/2018   Carpal tunnel syndrome, bilateral 12/28/2018   Chest pain 02/05/2018   Chronic combined systolic and diastolic CHF (congestive heart failure) (HCC)    a. LVEF 40-45% by echo 02/2018.   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 s/p CABG in 01/2018. b. Recurrent CP 02/2018 -> cath showing early graft closure SVG-diagonal, received DES to D1.   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   Essential hypertension 05/21/2018   Falls frequently 09/04/2021   Gastric ulcer,  unspecified as acute or chronic, without hemorrhage or perforation 05/27/2018   Gastroesophageal reflux disease 05/21/2018   Glass eye    right eye   Gout    on daily RX (03/02/2018)   Heme positive stool 05/21/2018   History of anemia 02/05/2021   History of cardiomyopathy 10/08/2022   History of cholecystectomy 02/05/2021   History of kidney stones    HLD (hyperlipidemia) 05/21/2018   Hyperlipidemia    Hypertension    Hypertensive heart disease    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   Kidney cysts    Kidney disease    Left ventricular aneurysm    Migraine    used to get them twice/week; haven't had them in a long time (03/02/2018)   Mixed dyslipidemia 10/08/2022   NSTEMI (non-ST elevated myocardial infarction) (HCC) 03/01/2018   Obesity (BMI 30-39.9) 07/25/2020   On home oxygen therapy    3L at night and prn during the day (03/02/2018)   Physical deconditioning 10/24/2020   Pneumonia    used to get it twice/year; stopped after I had hysterectomy (03/02/2018)   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   Senile purpura 09/04/2021   Sinus tachycardia 03/09/2018   Sleep apnea    SOB (shortness of breath) 03/09/2018   Symptomatic anemia 05/21/2018   Type II diabetes mellitus (HCC)     Past Surgical History:  Procedure Laterality Date  BACK SURGERY     BIOPSY  05/22/2018   Procedure: BIOPSY;  Surgeon: Elicia Claw, MD;  Location: [facility];  Service: Gastroenterology;;   CARDIAC CATHETERIZATION  12/2017   before [facility]   CHOLECYSTECTOMY OPEN  1982   CORONARY ARTERY BYPASS GRAFT  01/19/2018   CABG X3; in [place]    CORONARY STENT INTERVENTION N/A 03/02/2018   Procedure: CORONARY STENT INTERVENTION;  Surgeon: Verlin Lonni BIRCH, MD;  Location: [facility];  Service: Cardiovascular;  Laterality: N/A;   DILATION AND CURETTAGE OF UTERUS     ESOPHAGOGASTRODUODENOSCOPY (EGD) WITH PROPOFOL  N/A 05/22/2018   Procedure: ESOPHAGOGASTRODUODENOSCOPY  (EGD) WITH PROPOFOL ;  Surgeon: Elicia Claw, MD;  Location: [facility];  Service: Gastroenterology;  Laterality: N/A;   ESOPHAGOGASTRODUODENOSCOPY (EGD) WITH PROPOFOL  N/A 05/24/2018   Procedure: ESOPHAGOGASTRODUODENOSCOPY (EGD) WITH PROPOFOL ;  Surgeon: Elicia Claw, MD;  Location: [facility];  Service: Gastroenterology;  Laterality: N/A;   EYE SURGERY Right    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   KNEE ARTHROSCOPY Right X 4   LEFT HEART CATH AND CORS/GRAFTS ANGIOGRAPHY N/A 03/02/2018   Procedure: LEFT HEART CATH AND CORS/GRAFTS ANGIOGRAPHY;  Surgeon: Verlin Lonni BIRCH, MD;  [facility];  Service: Cardiovascular;  Laterality: N/A;   LEFT HEART CATH AND CORS/GRAFTS ANGIOGRAPHY N/A 06/13/2020   Procedure: LEFT HEART CATH AND CORS/GRAFTS ANGIOGRAPHY;  Surgeon: Verlin Lonni BIRCH, MD;  [facility];  Service: Cardiovascular;  Laterality: N/A;   LUMBAR DISC SURGERY     REDUCTION MAMMAPLASTY Bilateral X 2   SCHLEROTHERAPY  05/24/2018   Procedure: SCHLEROTHERAPY;  Surgeon: Elicia Claw, MD;  Location: [facility];  Service: Gastroenterology;;   TONSILLECTOMY  1953   VAGINAL HYSTERECTOMY  1970    Family History  Problem Relation Age of Onset   Heart Problems Mother    Diabetes Father    Kidney disease Sister    Heart disease Brother    Hypertension Brother    Kidney disease Brother    Hypertension Daughter    Hypertension Daughter    Hypertension Daughter    Hypertension Daughter     Social History:  reports that she quit smoking about 63 years ago. Her smoking use included cigarettes. She started smoking about 66 years ago. She has a 0.4 pack-year smoking history. She has never used smokeless tobacco. She reports that she does not currently use alcohol. She reports that she does not use drugs.  Allergies: Allergies[1]  Medications: I have reviewed the patient's current medications.  Results for orders placed or performed during the hospital  encounter of 12/11/24 (from the past 48 hours)  Type and screen [facility]     Status: None   Collection Time: 12/11/24  5:28 PM  Result Value Ref Range   ABO/RH(D) AB POS    Antibody Screen NEG    Sample Expiration      12/14/2024,2359 Performed at [facility], [address] [place], [place] [address]   Basic metabolic panel     Status: Abnormal   Collection Time: 12/11/24  5:29 PM  Result Value Ref Range   Sodium 140 135 - 145 mmol/L   Potassium 5.2 (H) 3.5 - 5.1 mmol/L   Chloride 103 98 - 111 mmol/L   CO2 23 22 - 32 mmol/L   Glucose, Bld 158 (H) 70 - 99 mg/dL    Comment: Glucose reference range applies only to samples taken after fasting for at least 8 hours.   BUN  49 (H) 8 - 23 mg/dL   Creatinine, Ser 8.61 (H) 0.44 - 1.00 mg/dL   Calcium  9.2 8.9 - 10.3 mg/dL   GFR, Estimated 38 (L) >60 mL/min    Comment: (NOTE) Calculated using the CKD-EPI Creatinine Equation (2021)    Anion gap 13 5 - 15    Comment: Performed at [facility], [address], [place], [place] [address]  CBC with Differential     Status: None   Collection Time: 12/11/24  5:29 PM  Result Value Ref Range   WBC 8.2 4.0 - 10.5 K/uL   RBC 4.55 3.87 - 5.11 MIL/uL   Hemoglobin 14.6 12.0 - 15.0 g/dL   HCT 55.6 63.9 - 53.9 %   MCV 97.4 80.0 - 100.0 fL   MCH 32.1 26.0 - 34.0 pg   MCHC 33.0 30.0 - 36.0 g/dL   RDW 85.9 88.4 - 84.4 %   Platelets 189 150 - 400 K/uL   nRBC 0.0 0.0 - 0.2 %   Neutrophils Relative % 70 %   Neutro Abs 5.9 1.7 - 7.7 K/uL   Lymphocytes Relative 21 %   Lymphs Abs 1.7 0.7 - 4.0 K/uL   Monocytes Relative 6 %   Monocytes Absolute 0.5 0.1 - 1.0 K/uL   Eosinophils Relative 1 %   Eosinophils Absolute 0.1 0.0 - 0.5 K/uL   Basophils Relative 1 %   Basophils Absolute 0.1 0.0 - 0.1 K/uL   Immature Granulocytes 1 %   Abs Immature Granulocytes 0.04 0.00 - 0.07 K/uL    Comment: Performed at [facility], [address], [place], [place] [address]   Protime-INR     Status: None   Collection Time: 12/11/24  5:29 PM  Result Value Ref Range   Prothrombin Time 13.1 11.4 - 15.2 seconds   INR 0.9 0.8 - 1.2    Comment: (NOTE) INR goal varies based on device and disease states. Performed at [facility], [address], [place], [place] [address]   CBG monitoring, ED     Status: Abnormal   Collection Time: 12/11/24  9:39 PM  Result Value Ref Range   Glucose-Capillary 169 (H) 70 - 99 mg/dL    Comment: Glucose reference range applies only to samples taken after fasting for at least 8 hours.  CBG monitoring, ED     Status: Abnormal   Collection Time: 12/12/24  8:31 AM  Result Value Ref Range   Glucose-Capillary 163 (H) 70 - 99 mg/dL    Comment: Glucose reference range applies only to samples taken after fasting for at least 8 hours.    DG Knee Left Port Result Date: 12/11/2024 EXAM: 1 or 2 VIEW(S) XRAY OF THE LEFT KNEE 12/11/2024 07:49:11 PM COMPARISON: None available. CLINICAL HISTORY: Closed displaced fracture of left femoral neck. ICD10: 8830196 Closed displaced fracture of left femoral neck. FINDINGS: BONES AND JOINTS: No acute fracture. No malalignment. No significant joint effusion. Severe tricompartmental degenerative changes. Near bone-on-bone appearance is demonstrated in the medial and lateral compartments. Prominent osteophyte formation in all 3 compartments. SOFT TISSUES: Vascular calcifications. IMPRESSION: 1. No acute findings. 2. Severe degenerative changes. Electronically signed by: Elsie Gravely MD 12/11/2024 07:52 PM EST RP Workstation: [id number]   CT CERVICAL SPINE WO CONTRAST Result Date: 12/11/2024 EXAM: CT CERVICAL SPINE WITHOUT CONTRAST 12/11/2024 05:49:04 PM TECHNIQUE: CT of the cervical spine was performed without the administration of intravenous contrast. Multiplanar reformatted images are provided for review. Automated exposure control, iterative reconstruction,  and/or weight based adjustment of the mA/kV  was utilized to reduce the radiation dose to as low as reasonably achievable. COMPARISON: 02/06/22 CLINICAL HISTORY: Polytrauma, blunt. FINDINGS: BONES AND ALIGNMENT: No acute fracture or traumatic malalignment. DEGENERATIVE CHANGES: Multilevel mild-to-moderate degenerative changes of the spine. Posterior longitudinal ligament and ligamentum flavum calcifications. No associated severe osseous neural foraminal or central canal stenosis. SOFT TISSUES: No prevertebral soft tissue swelling. Atherosclerotic plaques in the carotid arteries within the neck. Subcentimeter hypodense nodules within the thyroid gland. Calcified 6 mm left thyroid gland nodule. IMPRESSION: 1. No evidence of acute traumatic injury. Electronically signed by: Morgane Naveau MD 12/11/2024 05:57 PM EST RP Workstation: [id number]   CT HEAD WO CONTRAST Result Date: 12/11/2024 EXAM: CT HEAD WITHOUT CONTRAST 12/11/2024 05:49:04 PM TECHNIQUE: CT of the head was performed without the administration of intravenous contrast. Automated exposure control, iterative reconstruction, and/or weight based adjustment of the mA/kV was utilized to reduce the radiation dose to as low as reasonably achievable. COMPARISON: 12/12/2022 CLINICAL HISTORY: Head trauma, minor (Age >= 65 years). FINDINGS: BRAIN AND VENTRICLES: No acute hemorrhage. No evidence of acute infarct. No hydrocephalus. No extra-axial collection. No mass effect or midline shift. Left MCA aneurysmal coiling and stent. Patchy and confluent areas of decreased attenuation are noted throughout the deep and periventricular white matter of the cerebral hemispheres bilaterally suggestive of chronic microvascular ischemic changes. Streak artifact limiting evaluation. ORBITS: Right pthisis bulbi noted. Surgical changes of bilateral globes. SINUSES: No acute abnormality. SOFT TISSUES AND SKULL: No acute soft tissue abnormality. No skull fracture. IMPRESSION: 1. No acute intracranial abnormality. 2. Left MCA  aneurysmal coiling and stent. Electronically signed by: Morgane Naveau MD 12/11/2024 05:55 PM EST RP Workstation: [id number]   DG Hip Unilat With Pelvis 2-3 Views Left Result Date: 12/11/2024 EXAM: 2 or 3 VIEW(S) XRAY OF THE LEFT AND RIGHT HIP 12/11/2024 05:45:08 PM COMPARISON: None available. CLINICAL HISTORY: Medical clearance. FINDINGS: BONES AND JOINTS: Acute displaced and impacted left femoral neck fracture with varus angulation. No left hip dislocation. No acute displaced fracture or dislocation of the right hip. No acute displaced fracture or dislocation of the pelvis. Degenerative changes in lower lumbar spine. SOFT TISSUES: Surgical clips in right upper abdomen. Calcification within the left mid-abdomen of unclear etiology. Atherosclerotic calcifications in abdominal aorta. IMPRESSION: 1. Acute displaced and impacted left femoral neck fracture with varus angulation, without left hip dislocation. 2. No acute displaced fracture or dislocation of the right hip or pelvis. Electronically signed by: Morgane Naveau MD 12/11/2024 05:50 PM EST RP Workstation: [id number]   DG Chest Port 1 View Result Date: 12/11/2024 EXAM: 1 VIEW(S) XRAY OF THE CHEST 12/11/2024 05:45:08 PM COMPARISON: CT chest 12/12/2018, chest x-ray 09/30/2022. CLINICAL HISTORY: Medical clearance. FINDINGS: LUNGS AND PLEURA: Probable atelectasis or scarring at left lung base. No pleural effusion. No pneumothorax. HEART AND MEDIASTINUM: Surgical changes in mediastinum. BONES AND SOFT TISSUES: Sternotomy wires noted. Degenerative changes of the right shoulder. IMPRESSION: 1. No acute findings. Electronically signed by: Morgane Naveau MD 12/11/2024 05:49 PM EST RP Workstation: [id number]    Review of Systems  HENT:  Negative for ear discharge, ear pain, hearing loss and tinnitus.   Eyes:  Negative for photophobia and pain.  Respiratory:  Negative for cough and shortness of breath.   Cardiovascular:  Negative for chest pain.   Gastrointestinal:  Negative for abdominal pain, nausea and vomiting.  Genitourinary:  Negative for dysuria, flank pain, frequency and urgency.  Musculoskeletal:  Positive for arthralgias (Left hip). Negative for  back pain, myalgias and neck pain.  Neurological:  Negative for dizziness and headaches.  Hematological:  Does not bruise/bleed easily.  Psychiatric/Behavioral:  The patient is not nervous/anxious.    Blood pressure 101/64, pulse 82, temperature 98.5 F (36.9 C), resp. rate 18, height 5' 2 (1.575 m), weight 60 kg, SpO2 94%. Physical Exam Constitutional:      General: She is not in acute distress.    Appearance: She is well-developed. She is not diaphoretic.  HENT:     Head: Normocephalic and atraumatic.  Eyes:     General: No scleral icterus.       Right eye: No discharge.        Left eye: No discharge.     Conjunctiva/sclera: Conjunctivae normal.  Cardiovascular:     Rate and Rhythm: Normal rate and regular rhythm.  Pulmonary:     Effort: Pulmonary effort is normal. No respiratory distress.  Musculoskeletal:     Cervical back: Normal range of motion.     Comments: LLE No traumatic wounds, ecchymosis, or rash  Severe TTP hip  No knee or ankle effusion  Knee stable to varus/ valgus and anterior/posterior stress  Sens DPN, SPN, TN intact  Motor EHL, ext, flex, evers 5/5  DP 2+, PT 2+, No significant edema  Skin:    General: Skin is warm and dry.  Neurological:     Mental Status: She is alert.  Psychiatric:        Mood and Affect: Mood normal.        Behavior: Behavior normal.     Assessment/Plan: Left hip fx -- Plan THA today with Dr. Fidel. Please keep NPO.    Ozell DOROTHA Ned, PA-C Orthopedic Surgery [phone] 12/12/2024, 9:20 AM     [1]  Allergies Allergen Reactions   Methylprednisolone     Severe cramps legs and arms   Keflex [Cephalexin] Hives   Ketorolac  Swelling   Morphine  And Codeine  Itching   Novocain [Procaine] Swelling    Ramipril Other (See Comments)   Septra [Sulfamethoxazole-Trimethoprim] Nausea And Vomiting

## 2024-12-12 NOTE — ED Notes (Signed)
 Checked patient blood sugar it was 163 notified RN of blood sugar patient is resting with family at bedside and call bell in reach

## 2024-12-12 NOTE — Anesthesia Procedure Notes (Signed)
 Procedure Name: Intubation Date/Time: 12/12/2024 3:48 PM  Performed by: Denton Niels CROME, CRNAPre-anesthesia Checklist: Patient identified, Emergency Drugs available, Suction available and Patient being monitored Patient Re-evaluated:Patient Re-evaluated prior to induction Oxygen Delivery Method: Circle system utilized Preoxygenation: Pre-oxygenation with 100% oxygen Induction Type: IV induction Ventilation: Mask ventilation without difficulty Laryngoscope Size: Mac and 3 Grade View: Grade II Tube type: Oral Tube size: 6.5 mm Number of attempts: 1 Airway Equipment and Method: Stylet Placement Confirmation: ETT inserted through vocal cords under direct vision, positive ETCO2 and breath sounds checked- equal and bilateral Secured at: 22 cm Tube secured with: Tape Dental Injury: Teeth and Oropharynx as per pre-operative assessment

## 2024-12-12 NOTE — Discharge Instructions (Signed)

## 2024-12-12 NOTE — Transfer of Care (Addendum)
 Immediate Anesthesia Transfer of Care Note  Patient: Margaret Ward  Procedure(s) Performed: LEFT HEMIARTHROPLASTY ANTERIOR APPROACH, LEFT (Left: Hip)  Patient Location: PACU  Anesthesia Type:General  Level of Consciousness: awake, alert , oriented, and patient cooperative  Airway & Oxygen Therapy: Patient Spontanous Breathing and Patient connected to face mask oxygen  Post-op Assessment: Report given to RN and Post -op Vital signs reviewed and stable  Post vital signs: Reviewed and stable  Last Vitals:  Vitals Value Taken Time  BP 96/62 12/12/24 17:34  Temp 37 C 12/12/24 17:30  Pulse 99 12/12/24 17:42  Resp 21 12/12/24 17:42  SpO2 90 % 12/12/24 17:42  Vitals shown include unfiled device data.  Last Pain:  Vitals:   12/12/24 1730  TempSrc:   PainSc: 0-No pain      Patients Stated Pain Goal: 3 (12/12/24 0921)  Complications: No notable events documented.

## 2024-12-12 NOTE — Op Note (Signed)
 OPERATIVE REPORT  SURGEON: Redell Shoals, MD   ASSISTANT: Valery Potters, PA-C  PREOPERATIVE DIAGNOSIS: Displaced Left femoral neck fracture.   POSTOPERATIVE DIAGNOSIS: Displaced Left femoral neck fracture.   PROCEDURE: Left hip hemiarthroplasty, anterior approach.   IMPLANTS: Biomet Taperloc Complete Reduced Distal stem, size 15 x 150 mm, high offset, with a 28 -3 mm metal head ball and a 46 mm bipolar head ball.  ANESTHESIA:  General  ANTIBIOTICS: 500 mg Levofloxacin . 1000 mg vancomycin  (cephalosporin allergy).  ESTIMATED BLOOD LOSS:-200 mL    DRAINS: None.  COMPLICATIONS: None   CONDITION: PACU - hemodynamically stable.   BRIEF CLINICAL NOTE: Margaret Ward is a 84 y.o. female with a displaced Left femoral neck fracture. The patient was admitted to the hospitalist service and underwent perioperative risk stratification and medical optimization. The risks, benefits, and alternatives to hemiarthroplasty were explained, and the patient elected to proceed.  PROCEDURE IN DETAIL: The patient was taken to the operating room and general anesthesia was induced on the hospital bed.  The patient was then positioned on the Hana table.  All bony prominences were well padded.  The hip was prepped and draped in the normal sterile surgical fashion.  A time-out was called verifying side and site of surgery. Antibiotics were given within 60 minutes of beginning the procedure.   Bikini incision was made, and the direct anterior approach to the hip was performed through the Hueter interval.  Superficial dissection was carried out lateral to the ASIS. Lateral femoral circumflex vessels were treated with the Auqumantys. The anterior capsule was exposed and an inverted T capsulotomy was made. Fracture hematoma was encountered and evacuated. The patient was found to have a comminuted Left subcapital femoral neck fracture.  Inferior pubofemoral ligament was released subperiosteally to the lesser  trochanter. I freshened the femoral neck cut with a saw.  I removed the femoral neck fragment.  A corkscrew was placed into the head and the head was removed.  This was passed to the back table and was measured.   Acetabular exposure was achieved.  I examined the articular cartilage which was intact.  The labrum was intact. A 46 mm trial head was placed and found to have excellent fit.   I then gained femoral exposure taking care to protect the abductors and greater trochanter.  The superior capsule was incised longitudinally, staying lateral to the posterior border of the femoral neck. External rotation, extension, and adduction were applied.  A cookie cutter was used to enter the femoral canal, and then the femoral canal finder was used to confirm location.  I then sequentially broached up to a size 15.  Calcar planer was used on the femoral neck remnant.  I placed a high offset neck and a trial bipolar construct. The hip was reduced.  Leg lengths were checked fluoroscopically.  The hip was dislocated and trial components were removed.  I placed the real stem followed by the real bipolar construct.  A single reduction maneuver was performed and the hip was reduced.  Fluoroscopy was used to confirm component position and leg lengths.  At 90 degrees of external rotation and extension, the hip was stable to an anterior directed force.   The wound was copiously irrigated with Irrisept solution and normal saline using pulse lavage.  Marcaine  solution was injected into the periarticular soft tissue.  The wound was closed in layers using #1 Stratafix for the fascia, 2-0 Vicryl for the subcutaneous fat, 2-0 Monocryl for the deep dermal layer,  and staples + Dermabond for the skin.  Once the glue was fully dried, an Aquacell Ag dressing was applied.  The patient was then awakened from anesthesia and transported to the recovery room in stable condition.  Sponge, needle, and instrument counts were correct at the end of  the case x2.  The patient tolerated the procedure well and there were no known complications.  Please note that a surgical assistant was a medical necessity for this procedure to perform it in a safe and expeditious manner. Assistant was necessary to provide appropriate retraction of vital neurovascular structures, to prevent femoral fracture, and to allow for anatomic placement of the prosthesis.

## 2024-12-13 ENCOUNTER — Encounter (HOSPITAL_COMMUNITY): Payer: Self-pay | Admitting: Orthopedic Surgery

## 2024-12-13 DIAGNOSIS — S72002A Fracture of unspecified part of neck of left femur, initial encounter for closed fracture: Secondary | ICD-10-CM | POA: Diagnosis not present

## 2024-12-13 LAB — GLUCOSE, CAPILLARY
Glucose-Capillary: 151 mg/dL — ABNORMAL HIGH (ref 70–99)
Glucose-Capillary: 143 mg/dL — ABNORMAL HIGH (ref 70–99)
Glucose-Capillary: 200 mg/dL — ABNORMAL HIGH (ref 70–99)
Glucose-Capillary: 169 mg/dL — ABNORMAL HIGH (ref 70–99)

## 2024-12-13 MED ORDER — SODIUM CHLORIDE 0.9 % IV BOLUS
250.0000 mL | Freq: Once | INTRAVENOUS | Status: AC
  Administered 2024-12-13: 250 mL via INTRAVENOUS

## 2024-12-13 MED ORDER — ASPIRIN 81 MG PO CHEW
81.0000 mg | CHEWABLE_TABLET | Freq: Two times a day (BID) | ORAL | Status: DC
  Administered 2024-12-13 – 2024-12-14 (×2): 81 mg via ORAL
  Filled 2024-12-13 (×2): qty 1

## 2024-12-13 MED ORDER — KETOROLAC TROMETHAMINE 15 MG/ML IJ SOLN
15.0000 mg | Freq: Once | INTRAMUSCULAR | Status: AC
  Administered 2024-12-13: 15 mg via INTRAVENOUS
  Filled 2024-12-13: qty 1

## 2024-12-13 NOTE — [place] FL2 (Signed)
 " [place]  MEDICAID FL2 LEVEL OF CARE FORM     IDENTIFICATION  Patient Name: Margaret Ward Birthdate: 01/14/1941 Sex: female Admission Date (Current Location): 12/11/2024  [facility] and [place] Number:  Producer, Television/film/video and Address:  [facility], [address] [place], [place] [address]      Provider Number: [id number]  Attending Physician Name and Address:  Dino Antu, MD  Relative Name and Phone Number:  Servellon,Catherine Daughter  [phone]    Current Level of Care: Hospital Recommended Level of Care: Skilled Nursing Facility Prior Approval Number:    Date Approved/Denied:   PASRR Number: [id number]  Discharge Plan: SNF    Current Diagnoses: Patient Active Problem List   Diagnosis Date Noted   Hyperkalemia 12/11/2024   Closed displaced fracture of left femoral neck (HCC) 12/11/2024   Neuropathy 09/29/2023   Cardiac murmur 08/12/2023   Hypertension    Mixed dyslipidemia 10/08/2022   History of cardiomyopathy 10/08/2022   CAD in native artery 10/07/2022   Senile purpura 09/04/2021   Falls frequently 09/04/2021   Anemia due to chronic blood loss 02/05/2021   History of anemia 02/05/2021   History of cholecystectomy 02/05/2021   Physical deconditioning 10/24/2020   Anemia    Arthritis    Coronary artery disease of native artery of native heart with stable angina pectoris    Chronic combined systolic and diastolic CHF (congestive heart failure) (HCC)    Chronic lower back pain    Glass eye    Gout    History of kidney stones    Hypertensive heart disease    Kidney cysts    Kidney disease    Left ventricular aneurysm    Migraine    On home oxygen therapy    Pneumonia    Obesity (BMI 30-39.9) 07/25/2020   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   Bilateral carpal tunnel syndrome 10/11/2019   Carpal tunnel syndrome, bilateral 12/28/2018   Gastric ulcer, unspecified as acute or chronic, without hemorrhage  or perforation 05/27/2018   Cameron ulcer, acute 05/27/2018   Symptomatic anemia 05/21/2018   Essential hypertension 05/21/2018   CAD/s/p CABG Mar 2019 w/ DES to vein graft Apr 2019 05/21/2018   Gastroesophageal reflux disease 05/21/2018   Diabetes mellitus (HCC) 05/21/2018   Chronic combined systolic and diastolic heart failure (HCC) 05/21/2018   Heme positive stool 05/21/2018   HLD (hyperlipidemia) 05/21/2018   Sleep apnea 05/21/2018   CKD (chronic kidney disease), stage III (HCC) 05/21/2018   Type II diabetes mellitus (HCC) 05/21/2018   Type 2 diabetes mellitus in patient with obesity (HCC) 04/23/2018   Chronic diastolic heart failure (HCC) 03/28/2018   Bronchospasm 03/28/2018   Sinus tachycardia 03/09/2018   SOB (shortness of breath) 03/09/2018   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   NSTEMI (non-ST elevated myocardial infarction) (HCC) 03/01/2018   CAD (coronary artery disease) 02/08/2018   Hyperlipidemia 02/08/2018   Chest pain 02/05/2018   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    Orientation RESPIRATION BLADDER Height & Weight     Self, Time, Situation, Place  O2   Weight: 132 lb (59.9 kg) Height:  5' 2 (157.5 cm)  BEHAVIORAL SYMPTOMS/MOOD NEUROLOGICAL BOWEL NUTRITION STATUS        Diet (see discharge summary)  AMBULATORY STATUS COMMUNICATION OF NEEDS Skin   Total Care Verbally Surgical wounds, Other (Comment) (ecchymosis, redness)  Personal Care Assistance Level of Assistance  Bathing, Feeding, Dressing Bathing Assistance: Maximum assistance Feeding assistance: Limited assistance Dressing Assistance: Maximum assistance     Functional Limitations Info  Sight, Hearing, Speech Sight Info: Impaired Hearing Info: Impaired Speech Info: Adequate    SPECIAL CARE FACTORS FREQUENCY  PT (By licensed PT), OT (By licensed OT)     PT Frequency: 5x week OT Frequency: 5x week            Contractures Contractures Info: Not  present    Additional Factors Info  Code Status, Allergies, Insulin  Sliding Scale Code Status Info: full Allergies Info: Methylprednisolone, Keflex (Cephalexin), Ketorolac , Morphine  And Codeine , Novocain (Procaine), Ramipril, Septra (Sulfamethoxazole-trimethoprim)   Insulin  Sliding Scale Info: Novolog : see discharge summary       Current Medications (12/13/2024):  This is the current hospital active medication list Current Facility-Administered Medications  Medication Dose Route Frequency Provider Last Rate Last Admin   acetaminophen  (TYLENOL ) tablet 325-650 mg  325-650 mg Oral Q6H PRN Hill, Avery S, PA-C   650 mg at 12/13/24 1237   aspirin  chewable tablet 81 mg  81 mg Oral BID WC Hill, Avery S, PA-C       atorvastatin  (LIPITOR) tablet 10 mg  10 mg Oral Daily Leigh Serge S, PA-C   10 mg at 12/13/24 0935   bisacodyl  (DULCOLAX) suppository 10 mg  10 mg Rectal Daily PRN Hill, Avery S, PA-C       clopidogrel  (PLAVIX ) tablet 75 mg  75 mg Oral Daily Leigh Serge RAMAN, PA-C   75 mg at 12/13/24 9065   diphenhydrAMINE  (BENADRYL ) 12.5 MG/5ML elixir 12.5-25 mg  12.5-25 mg Oral Q4H PRN Leigh Serge RAMAN, PA-C       docusate sodium  (COLACE) capsule 100 mg  100 mg Oral BID Leigh Serge RAMAN, PA-C   100 mg at 12/13/24 9065   HYDROcodone -acetaminophen  (NORCO) 7.5-325 MG per tablet 1-2 tablet  1-2 tablet Oral Q4H PRN Leigh Serge RAMAN, PA-C   1 tablet at 12/13/24 0437   HYDROcodone -acetaminophen  (NORCO/VICODIN) 5-325 MG per tablet 1-2 tablet  1-2 tablet Oral Q4H PRN Hill, Avery S, PA-C       insulin  aspart (novoLOG ) injection 0-15 Units  0-15 Units Subcutaneous TID WC Hill, Avery S, PA-C   2 Units at 12/13/24 1159   insulin  aspart (novoLOG ) injection 0-5 Units  0-5 Units Subcutaneous QHS Hill, Avery S, PA-C       ketorolac  (TORADOL ) 15 MG/ML injection 15 mg  15 mg Intravenous Once Rashid, Farhan, MD       menthol  (CEPACOL) lozenge 3 mg  1 lozenge Oral PRN Leigh Serge RAMAN, PA-C       Or   phenol (CHLORASEPTIC) mouth spray 1  spray  1 spray Mouth/Throat PRN Hill, Serge RAMAN, PA-C       methocarbamol  (ROBAXIN ) tablet 500 mg  500 mg Oral Q6H PRN Leigh Serge RAMAN, PA-C   500 mg at 12/13/24 1237   Or   methocarbamol  (ROBAXIN ) injection 500 mg  500 mg Intravenous Q6H PRN Hill, Avery S, PA-C       metoCLOPramide  (REGLAN ) tablet 5-10 mg  5-10 mg Oral Q8H PRN Hill, Avery S, PA-C       Or   metoCLOPramide  (REGLAN ) injection 5-10 mg  5-10 mg Intravenous Q8H PRN Hill, Avery S, PA-C       morphine  (PF) 2 MG/ML injection 0.5-1 mg  0.5-1 mg Intravenous Q2H PRN Hill, Avery S, PA-C   1 mg at 12/13/24 [phone]  ondansetron  (ZOFRAN ) tablet 4 mg  4 mg Oral Q6H PRN Leigh Serge S, PA-C       Or   ondansetron  (ZOFRAN ) injection 4 mg  4 mg Intravenous Q6H PRN Hill, Avery S, PA-C       pantoprazole  (PROTONIX ) EC tablet 20 mg  20 mg Oral Daily Leigh Serge S, PA-C   20 mg at 12/13/24 0935   polyethylene glycol (MIRALAX  / GLYCOLAX ) packet 17 g  17 g Oral Daily PRN Leigh Serge RAMAN, PA-C       senna (SENOKOT) tablet 8.6 mg  1 tablet Oral BID Leigh Serge S, PA-C   8.6 mg at 12/13/24 9064     Discharge Medications: Please see discharge summary for a list of discharge medications.  Relevant Imaging Results:  Relevant Lab Results:   Additional Information SSN: [id number]  Bridget Cordella Simmonds, LCSW     "

## 2024-12-13 NOTE — Progress Notes (Signed)
" ° ° °  Subjective:  Patient reports pain as mild.  Denies N/V/CP/SOB/Abd pain. She denies any tingling or numbness in LE bilaterally.  Voiding without difficulty.   Objective:   VITALS:   Vitals:   12/12/24 2341 12/13/24 0439 12/13/24 0936 12/13/24 1200  BP: (!) 89/49 (!) 94/54 (!) 92/58 (!) 94/48  Pulse: 92 86 79   Resp: 16 16 14    Temp: 98.4 F (36.9 C) 97.7 F (36.5 C) 97.8 F (36.6 C)   TempSrc: Oral Oral Oral   SpO2: 99% 97% 97%   Weight:      Height:        NAD Neurologically intact ABD soft Neurovascular intact Sensation intact distally Intact pulses distally Dorsiflexion/Plantar flexion intact Incision: dressing C/D/I No cellulitis present Compartment soft   Lab Results  Component Value Date   WBC 8.2 12/11/2024   HGB 14.6 12/11/2024   HCT 44.3 12/11/2024   MCV 97.4 12/11/2024   PLT 189 12/11/2024   BMET    Component Value Date/Time   NA 137 12/12/2024 1853   NA 142 02/28/2022 0933   K 4.7 12/12/2024 1853   CL 101 12/12/2024 1853   CO2 21 (L) 12/12/2024 1853   GLUCOSE 193 (H) 12/12/2024 1853   BUN 37 (H) 12/12/2024 1853   BUN 39 (H) 02/28/2022 0933   CREATININE 1.19 (H) 12/12/2024 1853   CREATININE 1.24 (H) 04/01/2024 1459   CALCIUM  8.2 (L) 12/12/2024 1853   EGFR 43 (L) 04/01/2024 1459   EGFR 42 (L) 02/28/2022 0933   GFRNONAA 45 (L) 12/12/2024 1853     Assessment/Plan: 1 Day Post-Op   Principal Problem:   Closed displaced fracture of left femoral neck (HCC) Active Problems:   S/P CABG x 3   CAD (coronary artery disease)   Hyperlipidemia   Hypertensive heart disease with heart failure (HCC)   Chronic diastolic heart failure (HCC)   Type 2 diabetes mellitus in patient with obesity (HCC)   Essential hypertension   Gastroesophageal reflux disease   HLD (hyperlipidemia)   Sleep apnea   Hyperkalemia   WBAT with walker DVT ppx: Aspirin  and plavix , SCDs, TEDS PO pain control PT/OT: To come today Dispo:  - patient under care of the  medical team, disposition per their recommendation.  - DVT ppx and pain medication printed in chart.  - Per patient cardiologist said she did not need aspirin  just due to her being on plavix . Recommend aspirin  81mg  BID with plavix  for 6 weeks duration for DVT ppx.    Margaret Ward Potters 12/13/2024, 12:47 PM   [facility]  [place] [address], [address], [place], [place] [address] Phone: [phone] [email or web address] Facebook  [employer]      "

## 2024-12-13 NOTE — Care Management (Signed)
 SDOH resources added to AVS

## 2024-12-13 NOTE — TOC Initial Note (Signed)
 Transition of Care [facility]) - Initial/Assessment Note    Patient Details  Name: Margaret Ward MRN: [id number] Date of Birth: December 04, 1940  Transition of Care [facility]) CM/SW Contact:    Bridget Cordella Simmonds, LCSW Phone Number: 12/13/2024, 3:08 PM  Clinical Narrative: CSW met with pt and daughter Dorothyann regarding PT recommendation for SNF.  Pt from home with husband Gladis, no current services.  Permission given to speak with daughters Dorothyann Moccasin, and husband.       They are agreeable to SNF, requesting [facility], which is very nearby.  [facility] would be second choice. Permission given to send out referral in the hub.  Referral  sent out in hub, CSW reached out to [facility] and [facility].           Expected Discharge Plan: Skilled Nursing Facility Barriers to Discharge: Continued Medical Work up, SNF Pending bed offer   Patient Goals and CMS Choice Patient states their goals for this hospitalization and ongoing recovery are:: walking   Choice offered to / list presented to : Patient, Adult Children (daughter Dorothyann: they are requesting [facility], second choice: [facility])      Expected Discharge Plan and Services In-house Referral: Clinical Social Work   Post Acute Care Choice: Skilled Nursing Facility Living arrangements for the past 2 months: Apartment                                      Prior Living Arrangements/Services Living arrangements for the past 2 months: Apartment Lives with:: Spouse Patient language and need for interpreter reviewed:: Yes Do you feel safe going back to the place where you live?: Yes      Need for Family Participation in Patient Care: Yes (Comment) Care giver support system in place?: Yes (comment) Current home services: Other (comment) (none) Criminal Activity/Legal Involvement Pertinent to Current Situation/Hospitalization: No - Comment as needed  Activities of Daily Living   ADL Screening (condition at time of  admission) Independently performs ADLs?: Yes (appropriate for developmental age) Is the patient deaf or have difficulty hearing?: No Does the patient have difficulty seeing, even when wearing glasses/contacts?: Yes Does the patient have difficulty concentrating, remembering, or making decisions?: No  Permission Sought/Granted Permission sought to share information with : Family Supports Permission granted to share information with : Yes, Verbal Permission Granted  Share Information with NAME: daughters Dorothyann Moccasin, husband Gladis  Permission granted to share info w AGENCY: SNF        Emotional Assessment Appearance:: Appears stated age Attitude/Demeanor/Rapport: Engaged Affect (typically observed): Pleasant Orientation: : Oriented to Self, Oriented to Place, Oriented to  Time, Oriented to Situation      Admission diagnosis:  Left displaced femoral neck fracture (HCC) [S72.002A] Fall, initial encounter [W19.XXXA] Closed displaced fracture of left femoral neck (HCC) [S72.002A] Gastroesophageal reflux disease, unspecified whether esophagitis present [K21.9] Patient Active Problem List   Diagnosis Date Noted   Hyperkalemia 12/11/2024   Closed displaced fracture of left femoral neck (HCC) 12/11/2024   Neuropathy 09/29/2023   Cardiac murmur 08/12/2023   Hypertension    Mixed dyslipidemia 10/08/2022   History of cardiomyopathy 10/08/2022   CAD in native artery 10/07/2022   Senile purpura 09/04/2021   Falls frequently 09/04/2021   Anemia due to chronic blood loss 02/05/2021   History of anemia 02/05/2021   History of cholecystectomy 02/05/2021   Physical deconditioning 10/24/2020   Anemia  Arthritis    Coronary artery disease of native artery of native heart with stable angina pectoris    Chronic combined systolic and diastolic CHF (congestive heart failure) (HCC)    Chronic lower back pain    Glass eye    Gout    History of kidney stones    Hypertensive heart disease     Kidney cysts    Kidney disease    Left ventricular aneurysm    Migraine    On home oxygen therapy    Pneumonia    Obesity (BMI 30-39.9) 07/25/2020   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   Bilateral carpal tunnel syndrome 10/11/2019   Carpal tunnel syndrome, bilateral 12/28/2018   Gastric ulcer, unspecified as acute or chronic, without hemorrhage or perforation 05/27/2018   Cameron ulcer, acute 05/27/2018   Symptomatic anemia 05/21/2018   Essential hypertension 05/21/2018   CAD/s/p CABG Mar 2019 w/ DES to vein graft Apr 2019 05/21/2018   Gastroesophageal reflux disease 05/21/2018   Diabetes mellitus (HCC) 05/21/2018   Chronic combined systolic and diastolic heart failure (HCC) 05/21/2018   Heme positive stool 05/21/2018   HLD (hyperlipidemia) 05/21/2018   Sleep apnea 05/21/2018   CKD (chronic kidney disease), stage III (HCC) 05/21/2018   Type II diabetes mellitus (HCC) 05/21/2018   Type 2 diabetes mellitus in patient with obesity (HCC) 04/23/2018   Chronic diastolic heart failure (HCC) 03/28/2018   Bronchospasm 03/28/2018   Sinus tachycardia 03/09/2018   SOB (shortness of breath) 03/09/2018   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   NSTEMI (non-ST elevated myocardial infarction) (HCC) 03/01/2018   CAD (coronary artery disease) 02/08/2018   Hyperlipidemia 02/08/2018   Chest pain 02/05/2018   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   PCP:  Frann Mabel Mt, DO Pharmacy:   [id number] [place], [place] - [address] AT [facility] [address] [place] [address] Phone: [phone] Fax: [phone]     Social Drivers of Health (SDOH) Social History: SDOH Screenings   Food Insecurity: No Food Insecurity (12/13/2024)  Housing: Low Risk (12/13/2024)  Transportation Needs: No Transportation Needs (12/13/2024)  Utilities: Not At Risk (12/13/2024)  Alcohol Screen: Low Risk (07/12/2024)  Depression (PHQ2-9): Low  Risk (09/30/2024)  Financial Resource Strain: Low Risk (09/23/2024)  Physical Activity: Unknown (09/23/2024)  Recent Concern: Physical Activity - Insufficiently Active (07/12/2024)  Social Connections: Moderately Isolated (12/13/2024)  Stress: Patient Declined (09/23/2024)  Tobacco Use: Medium Risk (12/12/2024)  Health Literacy: Adequate Health Literacy (07/12/2024)   SDOH Interventions: Social Connections Interventions: [facility] Provided, Inpatient TOC   Readmission Risk Interventions     No data to display

## 2024-12-13 NOTE — Progress Notes (Signed)
 CSW spoke with pt and daughter Dorothyann regarding SDOH: social connections.  Pt more focused on mobility right now, lack of mobility does impact social opportunities.  Discussed community resources if pt is interested.  Daughter indicates getting out is challenging for pt. Cathlyn Ferry, MSW, LCSW 1/27/20262:56 PM

## 2024-12-13 NOTE — Evaluation (Signed)
 Physical Therapy Evaluation Patient Details Name: Margaret Ward MRN: [id number] DOB: August 19, 1941 Today's Date: 12/13/2024  History of Present Illness  84 y.o female presents to [facility] on 1/25 s/p fall with L femur fx. L hemiarthroplasty 1/26. PMH: HTN, HLD, CAD s/p CABG, DMII, CHF, and kidney disease.  Clinical Impression  Pt is currently mobilizing below her baseline due to recent fall with hip fx. Pt POD1 L hemiarthroplasty with WBAT precautions. Pt was independent with mobility with walker and independent for ALDs prior to admission. Pt requires mod-maxA to bed mobility due to L hip pain and difficulty lifting against gravity. Pt with difficulty tolerating hip flexion to sit EOB but improves with time. Pt requires maxA to complete STS and modA to step pivot to chair due to difficulty weight shifting to L leg and clearing L LE from the ground to take a step. Pt provides self cues throughout the transfers. Returned after session to assist pt back to bed with nursing. MaxA required for rolling to place bed pan. Pt would benefit from continued PT services focused on bed mobility, ROM, transfers, and progress towards gait to promote functional mobility. Pt appropriate for <3 hrs rehab upon discharge prior to safe return home.         If plan is discharge home, recommend the following: Two people to help with walking and/or transfers;Two people to help with bathing/dressing/bathroom;Assistance with cooking/housework;Assist for transportation;Help with stairs or ramp for entrance   Can travel by private vehicle   No (Hip fx --> Hemiarthoplasty. Pain levels. Level of assist)    Equipment Recommendations BSC/3in1 Forensic Scientist chair (pt is 131lbs))  Recommendations for Other Services       Functional Status Assessment Patient has had a recent decline in their functional status and demonstrates the ability to make significant improvements in function in a reasonable and predictable amount of time.      Precautions / Restrictions Precautions Precautions: Fall;Anterior Hip Precaution Booklet Issued: No Recall of Precautions/Restrictions: Intact Precaution/Restrictions Comments: No hip precautions ordered, anterior appropach. Restrictions Weight Bearing Restrictions Per Provider Order: Yes LLE Weight Bearing Per Provider Order: Weight bearing as tolerated      Mobility  Bed Mobility Overal bed mobility: Needs Assistance Bed Mobility: Supine to Sit, Rolling, Sit to Supine Rolling: Max assist   Supine to sit: Mod assist Sit to supine: Max assist   General bed mobility comments: Assist to bring L LE over EOB. Difficulty bringing trunk forward into increased hip flexion. Tolerance improves slightly with feet placed flat on the floor. Pt reaches for bed rail to assist with rolling, but overall maxA.    Transfers Overall transfer level: Needs assistance Equipment used: Rolling walker (2 wheels) Transfers: Sit to/from Stand, Bed to chair/wheelchair/BSC Sit to Stand: Mod assist, Max assist   Step pivot transfers: Mod assist       General transfer comment: Pt with difficulty initiating stance due to weakness and fatigue. Difficulty weight shifting onto L LE to step with R foot, difficulty with foot clearance of L LE when stepping forward. Assist to manage walker due to pt not progressing walker forward with her, assist with turns. Increased pain with returning to seated position.    Ambulation/Gait                  Stairs            Wheelchair Mobility     Tilt Bed    Modified Rankin (Stroke Patients Only)  Balance Overall balance assessment: Needs assistance Sitting-balance support: Feet supported, No upper extremity supported Sitting balance-Leahy Scale: Good Sitting balance - Comments: No sitting balance concerns.   Standing balance support: Bilateral upper extremity supported, During functional activity, Reliant on assistive device for  balance Standing balance-Leahy Scale: Poor Standing balance comment: Difficulty tolerating standing due to L hip pain and weakness. Requires walker and external support to remain standing. No knee buckling noted.                             Pertinent Vitals/Pain Pain Assessment Pain Assessment: 0-10 Pain Score: 9  Pain Location: L hip Pain Descriptors / Indicators: Burning, Aching, Sore Pain Intervention(s): Limited activity within patient's tolerance, Repositioned, Monitored during session, Other (comment) (LPN notified)    Home Living Family/patient expects to be discharged to:: Private residence Living Arrangements: Spouse/significant other Available Help at Discharge: Family;Available PRN/intermittently Type of Home: Apartment Home Access: Other (comment);Level entry (Has one curb to step up on)       Home Layout: One level Home Equipment: Rollator (4 wheels);Grab bars - toilet;Grab bars - tub/shower      Prior Function Prior Level of Function : Independent/Modified Independent             Mobility Comments: Uses rollator for all standing mobility. No difficulty PTA, independent. ADLs Comments: Independent. Hoping to find home aide that can do cleaning and cooking. Pt reports sensation of blacking out whenever she has to bend to the floor and pick something up, also occurs with laundry or loading the dishwasher. Uses reacher for objects that fall on the floor.     Extremity/Trunk Assessment   Upper Extremity Assessment Upper Extremity Assessment: Defer to OT evaluation    Lower Extremity Assessment Lower Extremity Assessment: Generalized weakness;RLE deficits/detail;LLE deficits/detail RLE Deficits / Details: Dorsiflexion 5/5, knee extension 3+/5, hip flexion 3+/5 RLE Sensation: WNL LLE Deficits / Details: WBAT. Dorsiflexion 4+/5, knee extension 3/5, hip flexion 2/5 (unable to lift against gravity) LLE: Unable to fully assess due to pain LLE  Sensation: WNL    Cervical / Trunk Assessment Cervical / Trunk Assessment: Kyphotic  Communication   Communication Communication: No apparent difficulties    Cognition Arousal: Alert Behavior During Therapy: WFL for tasks assessed/performed   PT - Cognitive impairments: No apparent impairments                         Following commands: Intact       Cueing Cueing Techniques: Verbal cues, Tactile cues, Visual cues     General Comments General comments (skin integrity, edema, etc.): BP 86/49 when in chair, asymptomatic at rest. Pt asymptomatic upon standing but reports sensation of blacking out once positioned in front of chair. Pt seated, legs elevated, and BP monitored. LPN notified. Incision site C,D,I.    Exercises General Exercises - Lower Extremity Ankle Circles/Pumps: AROM, Both, 10 reps, Seated Quad Sets: AROM, Both, 10 reps, Seated (Longsitting)   Assessment/Plan    PT Assessment Patient needs continued PT services  PT Problem List Decreased strength;Decreased mobility;Decreased safety awareness;Decreased range of motion;Decreased coordination;Decreased knowledge of precautions;Decreased activity tolerance;Decreased skin integrity;Decreased balance;Decreased knowledge of use of DME;Pain       PT Treatment Interventions DME instruction;Therapeutic exercise;Gait training;Balance training;Neuromuscular re-education;Functional mobility training;Therapeutic activities;Patient/family education    PT Goals (Current goals can be found in the Care Plan section)  Acute Rehab PT Goals Patient Stated Goal:  Go to rehab, walk better PT Goal Formulation: With patient Time For Goal Achievement: 12/27/24 Potential to Achieve Goals: Good    Frequency Min 2X/week     Co-evaluation               AM-PAC PT 6 Clicks Mobility  Outcome Measure Help needed turning from your back to your side while in a flat bed without using bedrails?: A Lot Help needed moving  from lying on your back to sitting on the side of a flat bed without using bedrails?: A Lot Help needed moving to and from a bed to a chair (including a wheelchair)?: A Lot Help needed standing up from a chair using your arms (e.g., wheelchair or bedside chair)?: A Lot Help needed to walk in hospital room?: Total Help needed climbing 3-5 steps with a railing? : Total 6 Click Score: 10    End of Session Equipment Utilized During Treatment: Gait belt Activity Tolerance: Patient limited by pain Patient left: in bed;with call bell/phone within reach;with bed alarm set Nurse Communication: Mobility status;Precautions;Weight bearing status PT Visit Diagnosis: Unsteadiness on feet (R26.81);Muscle weakness (generalized) (M62.81);Pain Pain - Right/Left: Left Pain - part of body: Hip    Time: 1100-1130 PT Time Calculation (min) (ACUTE ONLY): 30 min   Charges:   PT Evaluation $PT Eval Moderate Complexity: 1 Mod PT Treatments $Therapeutic Activity: 8-22 mins PT General Charges $$ ACUTE PT VISIT: 1 Visit         Sabra Morel, PT, DPT  Acute Rehabilitation Services         Office: [phone]     Sabra MARLA Morel 12/13/2024, 12:50 PM

## 2024-12-13 NOTE — Progress Notes (Addendum)
" °  Progress Note   Patient: Margaret Ward FMW:[id number] DOB: 04-Jan-1941 DOA: 12/11/2024     2 DOS: the patient was seen and examined on 12/13/2024   Brief hospital course:  84 y.o. female with medical history significant of coronary artery disease, chronic diastolic heart failure, chronic kidney disease stage III, type 2 diabetes, frequent falls, GERD, gout, essential hypertension, hyperlipidemia, who was brought to the ER after sustaining a mechanical fall in her bathroom.  She slipped and fell and landed on her left side.  Patient sustained pain immediately was unable to move her legs.  She was brought to the ER where she was seen and evaluated.  Patient was found to have closed left femoral neck fracture.  Dr. Fidel of orthopedics consulted   S/p Left hip hemiarthroplasty done on 1/26 Pending SNF placement.  Assessment and Plan:  Acute left femoral neck fracture, traumatic, POA:  s/p Left hip hemiarthroplasty, anterior approach. POD # 1 - Orthopedics on board -Continue with prn norco for moderate to severe pain. -Added intravenous Robaxin  500 mg every 8 hours for muscle spasms -Pending PT/OT evaluations and possible SNF placement.   Status post mechanical fall: Patient has had frequent falls in the past.  PT/OT   Type 2 diabetes: Continue sliding scale insulin .  Continue monitor   Essential hypertension: Blood pressure at this point is stable.  Continue home regimen.   Coronary artery disease: Stable.  Continue home regimen   Chronic diastolic heart failure: NYHA class II.  Compensated   Hyperkalemia: Resolved. Will hold Aldactone .   Hyperlipidemia: Continue statin   GERD: Continue with PPIs  Disposition: She was living at home with her husband.  She will likely need skilled nursing facility on discharge.     Subjective: She is s/p left hip hemiarthroplasty. Complaining of soreness of the left thigh but no significant pain. We spoke about discontinuing her IVF and  encouraged oral intake. She is agreeable to go to SNF. As per her, her cardiologist said that she can't be on aspirin .  Physical Exam: Vitals:   12/12/24 2000 12/12/24 2234 12/12/24 2341 12/13/24 0439  BP: (!) 82/46 95/63 (!) 89/49 (!) 94/54  Pulse: 93 85 92 86  Resp: 14  16 16   Temp: 98 F (36.7 C)  98.4 F (36.9 C) 97.7 F (36.5 C)  TempSrc: Oral  Oral Oral  SpO2: 94%  99% 97%  Weight:      Height:       Constitutional: NAD, calm, comfortable Eyes: Right eye blindness ENMT: Mucous membranes are moist. Posterior pharynx clear of any exudate or lesions.Normal dentition.  Neck: normal, supple, no masses, no thyromegaly Respiratory: clear to auscultation bilaterally, no wheezing, no crackles. Normal respiratory effort. No accessory muscle use.  Cardiovascular: Regular rate and rhythm, no murmurs / rubs / gallops. No extremity edema. 2+ pedal pulses. No carotid bruits.  Abdomen: no tenderness, no masses palpated. No hepatosplenomegaly. Bowel sounds positive.  Musculoskeletal: s/p left hip surgery, dressing in place, no obvious discharge noted Skin: no rashes, lesions, ulcers. No induration Neurologic: CN 2-12 grossly intact.  Data Reviewed:  There are no new results to review at this time.  Family Communication: None at the bedside  Disposition: Status is: Inpatient Remains inpatient appropriate because: left femur fracture  Planned Discharge Destination: Skilled nursing facility    Time spent: 40 minutes  Author: Deliliah Room, MD 12/13/2024 9:07 AM  For on call review [email or web address].  "

## 2024-12-14 DIAGNOSIS — S72002A Fracture of unspecified part of neck of left femur, initial encounter for closed fracture: Secondary | ICD-10-CM | POA: Diagnosis not present

## 2024-12-14 LAB — GLUCOSE, CAPILLARY
Glucose-Capillary: 167 mg/dL — ABNORMAL HIGH (ref 70–99)
Glucose-Capillary: 96 mg/dL (ref 70–99)

## 2024-12-14 MED ORDER — FUROSEMIDE 20 MG PO TABS: 20.0000 mg | ORAL_TABLET | Freq: Every day | ORAL | Status: AC | PRN

## 2024-12-14 NOTE — Anesthesia Postprocedure Evaluation (Signed)
"   Anesthesia Post Note  Patient: Margaret Ward  Procedure(s) Performed: LEFT HEMIARTHROPLASTY ANTERIOR APPROACH, LEFT (Left: Hip)     Patient location during evaluation: PACU Anesthesia Type: General Level of consciousness: sedated and patient cooperative Pain management: pain level controlled Vital Signs Assessment: post-procedure vital signs reviewed and stable Respiratory status: spontaneous breathing Cardiovascular status: stable Anesthetic complications: no   No notable events documented.  Last Vitals:  Vitals:   12/13/24 2100 12/14/24 0743  BP: (!) 97/53 (!) 115/58  Pulse: 88 79  Resp:  18  Temp: 36.9 C 36.5 C  SpO2: 100% 94%    Last Pain:  Vitals:   12/14/24 0743  TempSrc: Oral  PainSc:                  Norleen Pope      "

## 2024-12-14 NOTE — TOC Transition Note (Signed)
 Transition of Care [facility]) - Discharge Note   Patient Details  Name: Margaret Ward MRN: [id number] Date of Birth: October 29, 1941  [facility]) CM/SW Contact:  Bridget Cordella Simmonds, LCSW Phone Number: 12/14/2024, 12:13 PM   Clinical Narrative:   Pt discharging to [facility], room 507. RN call report to [phone].   PTAR called [phone].  Final next level of care: Skilled Nursing Facility Barriers to Discharge: Barriers Resolved   Patient Goals and CMS Choice Patient states their goals for this hospitalization and ongoing recovery are:: walking   Choice offered to / list presented to : Patient, Adult Children (daughter Dorothyann: they are requesting [facility], second choice: [facility])      Discharge Placement              Patient chooses bed at: [facility] Patient to be transferred to facility by: ptar Name of family member notified: daughter Comer Patient and family notified of of transfer: 12/14/24  Discharge Plan and Services Additional resources added to the After Visit Summary for   In-house Referral: Clinical Social Work   Post Acute Care Choice: Skilled Nursing Facility                               Social Drivers of Health (SDOH) Interventions SDOH Screenings   Food Insecurity: No Food Insecurity (12/13/2024)  Housing: Low Risk (12/13/2024)  Transportation Needs: No Transportation Needs (12/13/2024)  Utilities: Not At Risk (12/13/2024)  Alcohol Screen: Low Risk (07/12/2024)  Depression (PHQ2-9): Low Risk (09/30/2024)  Financial Resource Strain: Low Risk (09/23/2024)  Physical Activity: Unknown (09/23/2024)  Recent Concern: Physical Activity - Insufficiently Active (07/12/2024)  Social Connections: Moderately Isolated (12/13/2024)  Stress: Patient Declined (09/23/2024)  Tobacco Use: Medium Risk (12/12/2024)  Health Literacy: Adequate Health Literacy (07/12/2024)     Readmission Risk Interventions     No data to display

## 2024-12-14 NOTE — Discharge Summary (Addendum)
 " Physician Discharge Summary   Patient: Margaret Ward MRN: [id number] DOB: 1941/07/18  Admit date:     12/11/2024  Discharge date: 12/14/24  Discharge Physician: Deliliah Room   PCP: Frann Mabel Mt, DO   Recommendations at discharge:    F/u with your PCP on the scheduled appointment. F/u with orthopedics in 2 weeks.  Discharge Diagnoses: Principal Problem:   Closed displaced fracture of left femoral neck (HCC) Active Problems:   S/P CABG x 3   CAD (coronary artery disease)   Hyperlipidemia   Hypertensive heart disease with heart failure (HCC)   Chronic diastolic heart failure (HCC)   Type 2 diabetes mellitus in patient with obesity (HCC)   Essential hypertension   Gastroesophageal reflux disease   HLD (hyperlipidemia)   Sleep apnea   Hyperkalemia    Hospital Course:  84 y.o. female with medical history significant of coronary artery disease, chronic diastolic heart failure, chronic kidney disease stage III, type 2 diabetes, frequent falls, GERD, gout, essential hypertension, hyperlipidemia, who was brought to the ER after sustaining a mechanical fall in her bathroom.  She slipped and fell and landed on her left side.  Patient sustained pain immediately was unable to move her legs.  She was brought to the ER where she was seen and evaluated.  Patient was found to have closed left femoral neck fracture.  Dr. Fidel of orthopedics consulted    S/p Left hip hemiarthroplasty done on 1/26  Acute left femoral neck fracture, traumatic, POA:  s/p Left hip hemiarthroplasty, anterior approach. POD # 2 - Orthopedics on board -Continue with prn norco for moderate to severe pain. -PT/OT evaluations and SNF placement. -Ortho f/u in 2 weeks   Status post mechanical fall: Patient has had frequent falls in the past.  PT/OT   Type 2 diabetes: Continue with home regimen.  Continue monitor   Essential hypertension: Blood pressure at this point is stable.  Continue home  regimen except entresto .   Coronary artery disease: Stable.  Continue home regimen   Chronic diastolic heart failure: NYHA class II.  Compensated   Hyperkalemia: Resolved. Resumed home aldactone  on discharge.   Hyperlipidemia: Continue statin   GERD: Continue with PPIs   [facility]      Consultants: Ortho Procedures performed: Left hip hemiarthroplasty done on 1/26   Disposition: Skilled nursing facility Diet recommendation:  Cardiac diet DISCHARGE MEDICATION: Allergies as of 12/14/2024       Reactions   Methylprednisolone Other (See Comments)   Severe cramps legs and arms   Keflex [cephalexin] Hives   Ketorolac  Swelling   Morphine  And Codeine  Itching   Novocain [procaine] Swelling   Ramipril Other (See Comments)   Septra [sulfamethoxazole-trimethoprim] Nausea And Vomiting        Medication List     PAUSE taking these medications    Entresto  49-51 MG Wait to take this until your doctor or other care provider tells you to start again. Generic drug: sacubitril -valsartan  TAKE 1 TABLET BY MOUTH TWICE DAILY       TAKE these medications    acetaminophen  500 MG tablet Commonly known as: TYLENOL  Take 1,000 mg by mouth every 6 (six) hours as needed for headache.   allopurinol  100 MG tablet Commonly known as: ZYLOPRIM  TAKE 1 TABLET(100 MG) BY MOUTH DAILY   ascorbic acid  500 MG tablet Commonly known as: VITAMIN C  Take 500 mg by mouth 2 (two) times daily.   aspirin  81 MG chewable tablet Commonly known as:  Aspirin  Childrens Chew 1 tablet (81 mg total) by mouth 2 (two) times daily with a meal.   atorvastatin  10 MG tablet Commonly known as: LIPITOR Take 1 tablet (10 mg total) by mouth daily.   clopidogrel  75 MG tablet Commonly known as: PLAVIX  Take 1 tablet (75 mg total) by mouth daily.   empagliflozin  25 MG Tabs tablet Commonly known as: Jardiance  Take 1 tablet (25 mg total) by mouth daily.   furosemide  20 MG tablet Commonly known  as: LASIX  Take 1 tablet (20 mg total) by mouth daily as needed for fluid. What changed: See the new instructions.   HYDROcodone -acetaminophen  5-325 MG tablet Commonly known as: NORCO/VICODIN Take 1 tablet by mouth every 4 (four) hours as needed for up to 7 days for moderate pain (pain score 4-6) or severe pain (pain score 7-10).   metFORMIN  500 MG 24 hr tablet Commonly known as: GLUCOPHAGE -XR Take 1 tablet (500 mg total) by mouth daily with breakfast.   multivitamin with minerals Tabs tablet Take 1 tablet by mouth daily.   nitroGLYCERIN  0.4 MG SL tablet Commonly known as: NITROSTAT  Place 1 tablet (0.4 mg total) under the tongue every 5 (five) minutes x 3 doses as needed for chest pain.   NYQUIL PO Take 1 Dose by mouth at bedtime as needed (congestion).   pantoprazole  20 MG tablet Commonly known as: PROTONIX  TAKE 1 TABLET(20 MG) BY MOUTH DAILY   spironolactone  25 MG tablet Commonly known as: ALDACTONE  TAKE 1/2 TABLET(12.5 MG) BY MOUTH DAILY   venlafaxine  XR 75 MG 24 hr capsule Commonly known as: EFFEXOR -XR TAKE 1 CAPSULE(75 MG) BY MOUTH DAILY WITH BREAKFAST        Contact information for follow-up providers     Leigh Valery RAMAN, PA-C. Schedule an appointment as soon as possible for a visit in 2 week(s).   Specialty: Orthopedic Surgery Why: For suture removal, For wound re-check Contact information: [address] [place] [address] [phone]         Frann Mabel Mt, DO. Go on 12/23/2024.   Specialty: Family Medicine Why: Please attend your hospital follow up appointment with Dr Frann on Friday, 12/23/24, at 130pm. Contact information: [address] [place] [address] [phone]              Contact information for after-discharge care     Destination     [facility]   Service: Skilled Nursing Contact information: [address] [place]  [address] [phone]                     Discharge Exam: Fredricka Weights   12/11/24 1849 12/12/24 1436  Weight: 60 kg 59.9 kg   Constitutional: NAD, calm, comfortable Eyes: Right eye blindness ENMT: Mucous membranes are moist. Posterior pharynx clear of any exudate or lesions.Normal dentition.  Neck: normal, supple, no masses, no thyromegaly Respiratory: clear to auscultation bilaterally, no wheezing, no crackles. Normal respiratory effort. No accessory muscle use.  Cardiovascular: Regular rate and rhythm, no murmurs / rubs / gallops. No extremity edema. 2+ pedal pulses. No carotid bruits.  Abdomen: no tenderness, no masses palpated. No hepatosplenomegaly. Bowel sounds positive.  Musculoskeletal: s/p left hip surgery, dressing in place, no obvious discharge noted Skin: no rashes, lesions, ulcers. No induration Neurologic: CN 2-12 grossly intact.  Condition at discharge: good  The results of significant diagnostics from this hospitalization (including imaging, microbiology, ancillary and laboratory) are listed below for reference.  Imaging Studies: DG Pelvis Portable Result Date: 12/12/2024 EXAM: 1 OR 2 VIEW(S) XRAY OF THE PELVIS 12/12/2024 05:49:00 PM COMPARISON: None available. CLINICAL HISTORY: Closed displaced fracture of left femoral neck. ICD10: 8830196 Closed displaced fracture of left femoral neck. FINDINGS: BONES AND JOINTS: Left hip arthroplasty noted. SOFT TISSUES: Subcutaneous emphysema and skin staples present. IMPRESSION: 1. Status post left hip arthroplasty for displaced left femoral neck fracture. Electronically signed by: Oneil Devonshire MD 12/12/2024 06:24 PM EST RP Workstation: [id number]   DG HIP UNILAT WITH PELVIS 1V LEFT Result Date: 12/12/2024 CLINICAL DATA:  [id number] Surgery, elective [id number] EXAM: DG HIP (WITH OR WITHOUT PELVIS) 1V*L* COMPARISON:  None Available. FINDINGS: The provided image demonstrates left hip arthroplasty. Please refer to the separately issued operative report for further  details. Fluoroscopy time: 5.4 seconds Radiation dose: 0.5121 mGy IMPRESSION: Intraoperative fluoroscopic guidance, as described above. Electronically Signed   By: Ree Molt M.D.   On: 12/12/2024 17:03   DG C-Arm 1-60 Min-No Report Result Date: 12/12/2024 Fluoroscopy was utilized by the requesting physician.  No radiographic interpretation.   DG Knee Left Port Result Date: 12/11/2024 EXAM: 1 or 2 VIEW(S) XRAY OF THE LEFT KNEE 12/11/2024 07:49:11 PM COMPARISON: None available. CLINICAL HISTORY: Closed displaced fracture of left femoral neck. ICD10: 8830196 Closed displaced fracture of left femoral neck. FINDINGS: BONES AND JOINTS: No acute fracture. No malalignment. No significant joint effusion. Severe tricompartmental degenerative changes. Near bone-on-bone appearance is demonstrated in the medial and lateral compartments. Prominent osteophyte formation in all 3 compartments. SOFT TISSUES: Vascular calcifications. IMPRESSION: 1. No acute findings. 2. Severe degenerative changes. Electronically signed by: Elsie Gravely MD 12/11/2024 07:52 PM EST RP Workstation: [id number]   CT CERVICAL SPINE WO CONTRAST Result Date: 12/11/2024 EXAM: CT CERVICAL SPINE WITHOUT CONTRAST 12/11/2024 05:49:04 PM TECHNIQUE: CT of the cervical spine was performed without the administration of intravenous contrast. Multiplanar reformatted images are provided for review. Automated exposure control, iterative reconstruction, and/or weight based adjustment of the mA/kV was utilized to reduce the radiation dose to as low as reasonably achievable. COMPARISON: 02/06/22 CLINICAL HISTORY: Polytrauma, blunt. FINDINGS: BONES AND ALIGNMENT: No acute fracture or traumatic malalignment. DEGENERATIVE CHANGES: Multilevel mild-to-moderate degenerative changes of the spine. Posterior longitudinal ligament and ligamentum flavum calcifications. No associated severe osseous neural foraminal or central canal stenosis. SOFT TISSUES: No prevertebral  soft tissue swelling. Atherosclerotic plaques in the carotid arteries within the neck. Subcentimeter hypodense nodules within the thyroid gland. Calcified 6 mm left thyroid gland nodule. IMPRESSION: 1. No evidence of acute traumatic injury. Electronically signed by: Morgane Naveau MD 12/11/2024 05:57 PM EST RP Workstation: [id number]   CT HEAD WO CONTRAST Result Date: 12/11/2024 EXAM: CT HEAD WITHOUT CONTRAST 12/11/2024 05:49:04 PM TECHNIQUE: CT of the head was performed without the administration of intravenous contrast. Automated exposure control, iterative reconstruction, and/or weight based adjustment of the mA/kV was utilized to reduce the radiation dose to as low as reasonably achievable. COMPARISON: 12/12/2022 CLINICAL HISTORY: Head trauma, minor (Age >= 65 years). FINDINGS: BRAIN AND VENTRICLES: No acute hemorrhage. No evidence of acute infarct. No hydrocephalus. No extra-axial collection. No mass effect or midline shift. Left MCA aneurysmal coiling and stent. Patchy and confluent areas of decreased attenuation are noted throughout the deep and periventricular white matter of the cerebral hemispheres bilaterally suggestive of chronic microvascular ischemic changes. Streak artifact limiting evaluation. ORBITS: Right pthisis bulbi noted. Surgical changes of bilateral globes. SINUSES: No acute abnormality. SOFT TISSUES AND SKULL: No acute soft tissue abnormality. No skull fracture. IMPRESSION:  1. No acute intracranial abnormality. 2. Left MCA aneurysmal coiling and stent. Electronically signed by: Morgane Naveau MD 12/11/2024 05:55 PM EST RP Workstation: [id number]   DG Hip Unilat With Pelvis 2-3 Views Left Result Date: 12/11/2024 EXAM: 2 or 3 VIEW(S) XRAY OF THE LEFT AND RIGHT HIP 12/11/2024 05:45:08 PM COMPARISON: None available. CLINICAL HISTORY: Medical clearance. FINDINGS: BONES AND JOINTS: Acute displaced and impacted left femoral neck fracture with varus angulation. No left hip dislocation. No  acute displaced fracture or dislocation of the right hip. No acute displaced fracture or dislocation of the pelvis. Degenerative changes in lower lumbar spine. SOFT TISSUES: Surgical clips in right upper abdomen. Calcification within the left mid-abdomen of unclear etiology. Atherosclerotic calcifications in abdominal aorta. IMPRESSION: 1. Acute displaced and impacted left femoral neck fracture with varus angulation, without left hip dislocation. 2. No acute displaced fracture or dislocation of the right hip or pelvis. Electronically signed by: Morgane Naveau MD 12/11/2024 05:50 PM EST RP Workstation: [id number]   DG Chest Port 1 View Result Date: 12/11/2024 EXAM: 1 VIEW(S) XRAY OF THE CHEST 12/11/2024 05:45:08 PM COMPARISON: CT chest 12/12/2018, chest x-ray 09/30/2022. CLINICAL HISTORY: Medical clearance. FINDINGS: LUNGS AND PLEURA: Probable atelectasis or scarring at left lung base. No pleural effusion. No pneumothorax. HEART AND MEDIASTINUM: Surgical changes in mediastinum. BONES AND SOFT TISSUES: Sternotomy wires noted. Degenerative changes of the right shoulder. IMPRESSION: 1. No acute findings. Electronically signed by: Morgane Naveau MD 12/11/2024 05:49 PM EST RP Workstation: [id number]    Microbiology: Results for orders placed or performed during the hospital encounter of 12/11/24  Surgical pcr screen     Status: None   Collection Time: 12/12/24  8:40 AM   Specimen: Nasal Mucosa; Nasal Swab  Result Value Ref Range Status   MRSA, PCR NEGATIVE NEGATIVE Final   Staphylococcus aureus NEGATIVE NEGATIVE Final    Comment: (NOTE) The Xpert SA Assay (FDA approved for NASAL specimens in patients 30 years of age and older), is one component of a comprehensive surveillance program. It is not intended to diagnose infection nor to guide or monitor treatment. Performed at [facility], [address] [place], [place] [address]     Labs: CBC: Recent Labs  Lab 12/11/24 1729  WBC 8.2   NEUTROABS 5.9  HGB 14.6  HCT 44.3  MCV 97.4  PLT 189   Basic Metabolic Panel: Recent Labs  Lab 12/11/24 1729 12/12/24 1853  NA 140 137  K 5.2* 4.7  CL 103 101  CO2 23 21*  GLUCOSE 158* 193*  BUN 49* 37*  CREATININE 1.38* 1.19*  CALCIUM  9.2 8.2*   Liver Function Tests: No results for input(s): AST, ALT, ALKPHOS, BILITOT, PROT, ALBUMIN  in the last 168 hours. CBG: Recent Labs  Lab 12/13/24 1105 12/13/24 1653 12/13/24 2128 12/14/24 0608 12/14/24 1126  GLUCAP 143* 200* 169* 167* 96    Discharge time spent: 40 minutes.  Signed: Deliliah Room, MD [facility] 12/14/2024 "

## 2024-12-14 NOTE — Care Management Important Message (Signed)
 Important Message  Patient Details  Name: Margaret Ward MRN: [id number] Date of Birth: [birth year]   Important Message Given:  Yes - Medicare IM     Claretta Deed 12/14/2024, 3:43 PM

## 2024-12-14 NOTE — TOC Progression Note (Addendum)
 Transition of Care [facility]) - Progression Note    Patient Details  Name: Margaret Ward MRN: [id number] Date of Birth: 05-Apr-1941  [facility]) CM/SW Contact  Bridget Cordella Simmonds, LCSW Phone Number: 12/14/2024, 10:36 AM  Clinical Narrative:   CSW confirmed with [facility]: they do have bed available today.  CMS team pt: PCP hospital follow up appt scheduled with Dr Frann, 12/23/24 1330.  SDOH note in earlier.   Medicare payer with inpt order 12/11/24.    Expected Discharge Plan: Skilled Nursing Facility Barriers to Discharge: Continued Medical Work up, SNF Pending bed offer               Expected Discharge Plan and Services In-house Referral: Clinical Social Work   Post Acute Care Choice: Skilled Nursing Facility Living arrangements for the past 2 months: Apartment                                       Social Drivers of Health (SDOH) Interventions SDOH Screenings   Food Insecurity: No Food Insecurity (12/13/2024)  Housing: Low Risk (12/13/2024)  Transportation Needs: No Transportation Needs (12/13/2024)  Utilities: Not At Risk (12/13/2024)  Alcohol Screen: Low Risk (07/12/2024)  Depression (PHQ2-9): Low Risk (09/30/2024)  Financial Resource Strain: Low Risk (09/23/2024)  Physical Activity: Unknown (09/23/2024)  Recent Concern: Physical Activity - Insufficiently Active (07/12/2024)  Social Connections: Moderately Isolated (12/13/2024)  Stress: Patient Declined (09/23/2024)  Tobacco Use: Medium Risk (12/12/2024)  Health Literacy: Adequate Health Literacy (07/12/2024)    Readmission Risk Interventions     No data to display

## 2024-12-23 ENCOUNTER — Inpatient Hospital Stay: Admitting: Family Medicine

## 2025-03-29 ENCOUNTER — Ambulatory Visit: Admitting: Family Medicine

## 2025-03-31 ENCOUNTER — Ambulatory Visit: Admitting: Family Medicine

## 2025-07-18 ENCOUNTER — Ambulatory Visit
